# Patient Record
Sex: Female | Born: 1935 | Race: White | Hispanic: No | Smoking: Never smoker
Health system: Southern US, Community
[De-identification: ages and names within clinical notes are randomized; demographics above are authoritative.]

## PROBLEM LIST (undated history)

## (undated) DIAGNOSIS — K5790 Diverticulosis of intestine, part unspecified, without perforation or abscess without bleeding: Secondary | ICD-10-CM

## (undated) DIAGNOSIS — K760 Fatty (change of) liver, not elsewhere classified: Secondary | ICD-10-CM

## (undated) DIAGNOSIS — C801 Malignant (primary) neoplasm, unspecified: Secondary | ICD-10-CM

## (undated) DIAGNOSIS — I10 Essential (primary) hypertension: Secondary | ICD-10-CM

## (undated) DIAGNOSIS — H3552 Pigmentary retinal dystrophy: Secondary | ICD-10-CM

## (undated) DIAGNOSIS — R531 Weakness: Secondary | ICD-10-CM

## (undated) DIAGNOSIS — G5600 Carpal tunnel syndrome, unspecified upper limb: Secondary | ICD-10-CM

## (undated) DIAGNOSIS — M549 Dorsalgia, unspecified: Secondary | ICD-10-CM

## (undated) DIAGNOSIS — H548 Legal blindness, as defined in USA: Secondary | ICD-10-CM

## (undated) DIAGNOSIS — E785 Hyperlipidemia, unspecified: Secondary | ICD-10-CM

## (undated) DIAGNOSIS — I700 Atherosclerosis of aorta: Secondary | ICD-10-CM

## (undated) DIAGNOSIS — R351 Nocturia: Secondary | ICD-10-CM

## (undated) DIAGNOSIS — F411 Generalized anxiety disorder: Secondary | ICD-10-CM

## (undated) DIAGNOSIS — D649 Anemia, unspecified: Secondary | ICD-10-CM

## (undated) DIAGNOSIS — M1990 Unspecified osteoarthritis, unspecified site: Secondary | ICD-10-CM

## (undated) DIAGNOSIS — G8929 Other chronic pain: Secondary | ICD-10-CM

## (undated) DIAGNOSIS — Z8619 Personal history of other infectious and parasitic diseases: Secondary | ICD-10-CM

## (undated) DIAGNOSIS — K219 Gastro-esophageal reflux disease without esophagitis: Secondary | ICD-10-CM

## (undated) DIAGNOSIS — Z9289 Personal history of other medical treatment: Secondary | ICD-10-CM

## (undated) HISTORY — DX: Fatty (change of) liver, not elsewhere classified: K76.0

## (undated) HISTORY — DX: Diverticulosis of intestine, part unspecified, without perforation or abscess without bleeding: K57.90

## (undated) HISTORY — DX: Carpal tunnel syndrome, unspecified upper limb: G56.00

## (undated) HISTORY — PX: OTHER SURGICAL HISTORY: SHX169

## (undated) HISTORY — DX: Atherosclerosis of aorta: I70.0

## (undated) HISTORY — DX: Essential (primary) hypertension: I10

## (undated) HISTORY — DX: Pigmentary retinal dystrophy: H35.52

## (undated) HISTORY — DX: Hyperlipidemia, unspecified: E78.5

## (undated) HISTORY — DX: Malignant (primary) neoplasm, unspecified: C80.1

## (undated) HISTORY — DX: Gastro-esophageal reflux disease without esophagitis: K21.9

## (undated) HISTORY — DX: Generalized anxiety disorder: F41.1

## (undated) HISTORY — DX: Anemia, unspecified: D64.9

## (undated) HISTORY — PX: COLONOSCOPY: SHX174

---
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
## 1971-05-11 HISTORY — PX: OTHER SURGICAL HISTORY: SHX169

## 1987-05-11 HISTORY — PX: OOPHORECTOMY: SHX86

## 1987-05-11 HISTORY — PX: ABDOMINAL HYSTERECTOMY: SHX81

## 1998-09-29 ENCOUNTER — Ambulatory Visit (HOSPITAL_COMMUNITY): Admission: RE | Admit: 1998-09-29 | Discharge: 1998-09-29 | Payer: Self-pay | Admitting: Internal Medicine

## 1999-11-10 ENCOUNTER — Encounter: Payer: Self-pay | Admitting: Surgery

## 1999-11-10 ENCOUNTER — Ambulatory Visit (HOSPITAL_COMMUNITY): Admission: RE | Admit: 1999-11-10 | Discharge: 1999-11-10 | Payer: Self-pay | Admitting: Surgery

## 2000-11-22 ENCOUNTER — Encounter: Payer: Self-pay | Admitting: Internal Medicine

## 2000-11-22 ENCOUNTER — Ambulatory Visit (HOSPITAL_COMMUNITY): Admission: RE | Admit: 2000-11-22 | Discharge: 2000-11-22 | Payer: Self-pay | Admitting: Internal Medicine

## 2001-08-29 ENCOUNTER — Ambulatory Visit (HOSPITAL_BASED_OUTPATIENT_CLINIC_OR_DEPARTMENT_OTHER): Admission: RE | Admit: 2001-08-29 | Discharge: 2001-08-29 | Payer: Self-pay | Admitting: Orthopedic Surgery

## 2002-02-26 ENCOUNTER — Ambulatory Visit (HOSPITAL_COMMUNITY): Admission: RE | Admit: 2002-02-26 | Discharge: 2002-02-26 | Payer: Self-pay | Admitting: Internal Medicine

## 2002-02-26 ENCOUNTER — Encounter: Payer: Self-pay | Admitting: Internal Medicine

## 2002-05-10 DIAGNOSIS — Z85038 Personal history of other malignant neoplasm of large intestine: Secondary | ICD-10-CM

## 2002-05-10 HISTORY — DX: Personal history of other malignant neoplasm of large intestine: Z85.038

## 2003-04-05 ENCOUNTER — Ambulatory Visit (HOSPITAL_COMMUNITY): Admission: RE | Admit: 2003-04-05 | Discharge: 2003-04-05 | Payer: Self-pay | Admitting: General Surgery

## 2003-04-08 ENCOUNTER — Inpatient Hospital Stay (HOSPITAL_COMMUNITY): Admission: RE | Admit: 2003-04-08 | Discharge: 2003-04-15 | Payer: Self-pay | Admitting: General Surgery

## 2003-04-08 ENCOUNTER — Encounter (INDEPENDENT_AMBULATORY_CARE_PROVIDER_SITE_OTHER): Payer: Self-pay

## 2003-05-11 DIAGNOSIS — C801 Malignant (primary) neoplasm, unspecified: Secondary | ICD-10-CM

## 2003-05-11 HISTORY — DX: Malignant (primary) neoplasm, unspecified: C80.1

## 2003-09-03 ENCOUNTER — Ambulatory Visit (HOSPITAL_COMMUNITY): Admission: RE | Admit: 2003-09-03 | Discharge: 2003-09-03 | Payer: Self-pay | Admitting: Oncology

## 2004-03-24 ENCOUNTER — Ambulatory Visit: Payer: Self-pay | Admitting: Internal Medicine

## 2004-04-28 ENCOUNTER — Ambulatory Visit: Payer: Self-pay | Admitting: Oncology

## 2004-05-15 ENCOUNTER — Ambulatory Visit: Payer: Self-pay | Admitting: Internal Medicine

## 2004-05-28 ENCOUNTER — Ambulatory Visit: Payer: Self-pay | Admitting: Internal Medicine

## 2004-07-20 ENCOUNTER — Ambulatory Visit: Payer: Self-pay | Admitting: Oncology

## 2004-08-04 ENCOUNTER — Ambulatory Visit (HOSPITAL_COMMUNITY): Admission: RE | Admit: 2004-08-04 | Discharge: 2004-08-04 | Payer: Self-pay | Admitting: Oncology

## 2004-09-07 ENCOUNTER — Encounter: Admission: RE | Admit: 2004-09-07 | Discharge: 2004-09-07 | Payer: Self-pay | Admitting: Oncology

## 2005-01-22 ENCOUNTER — Ambulatory Visit (HOSPITAL_COMMUNITY): Admission: RE | Admit: 2005-01-22 | Discharge: 2005-01-22 | Payer: Self-pay | Admitting: Oncology

## 2005-01-22 ENCOUNTER — Ambulatory Visit: Payer: Self-pay | Admitting: Oncology

## 2005-01-28 ENCOUNTER — Ambulatory Visit: Payer: Self-pay | Admitting: Internal Medicine

## 2005-01-28 ENCOUNTER — Emergency Department (HOSPITAL_COMMUNITY): Admission: EM | Admit: 2005-01-28 | Discharge: 2005-01-28 | Payer: Self-pay | Admitting: Emergency Medicine

## 2005-01-29 ENCOUNTER — Ambulatory Visit: Admission: RE | Admit: 2005-01-29 | Discharge: 2005-01-29 | Payer: Self-pay | Admitting: Internal Medicine

## 2005-02-03 ENCOUNTER — Ambulatory Visit: Payer: Self-pay | Admitting: Internal Medicine

## 2005-02-15 ENCOUNTER — Ambulatory Visit: Payer: Self-pay | Admitting: Oncology

## 2005-02-16 ENCOUNTER — Ambulatory Visit: Payer: Self-pay | Admitting: Internal Medicine

## 2005-05-04 ENCOUNTER — Ambulatory Visit: Payer: Self-pay | Admitting: Internal Medicine

## 2005-05-27 ENCOUNTER — Ambulatory Visit: Payer: Self-pay | Admitting: Internal Medicine

## 2005-07-12 ENCOUNTER — Ambulatory Visit: Payer: Self-pay | Admitting: Internal Medicine

## 2005-07-13 ENCOUNTER — Ambulatory Visit: Payer: Self-pay | Admitting: Internal Medicine

## 2005-07-28 ENCOUNTER — Ambulatory Visit: Payer: Self-pay | Admitting: Internal Medicine

## 2005-07-28 ENCOUNTER — Encounter (INDEPENDENT_AMBULATORY_CARE_PROVIDER_SITE_OTHER): Payer: Self-pay | Admitting: *Deleted

## 2005-08-06 ENCOUNTER — Ambulatory Visit: Payer: Self-pay | Admitting: Oncology

## 2005-09-08 ENCOUNTER — Encounter: Payer: Self-pay | Admitting: Internal Medicine

## 2005-09-13 ENCOUNTER — Encounter: Admission: RE | Admit: 2005-09-13 | Discharge: 2005-09-13 | Payer: Self-pay | Admitting: Internal Medicine

## 2006-01-11 ENCOUNTER — Ambulatory Visit: Payer: Self-pay | Admitting: Internal Medicine

## 2006-01-17 ENCOUNTER — Ambulatory Visit: Payer: Self-pay | Admitting: Internal Medicine

## 2006-02-04 ENCOUNTER — Ambulatory Visit: Payer: Self-pay | Admitting: Oncology

## 2006-02-08 LAB — CBC WITH DIFFERENTIAL/PLATELET
BASO%: 0.6 % (ref 0.0–2.0)
Basophils Absolute: 0 10*3/uL (ref 0.0–0.1)
EOS%: 1.8 % (ref 0.0–7.0)
Eosinophils Absolute: 0.1 10*3/uL (ref 0.0–0.5)
HCT: 37.5 % (ref 34.8–46.6)
HGB: 13 g/dL (ref 11.6–15.9)
LYMPH%: 30.2 % (ref 14.0–48.0)
MCH: 30.1 pg (ref 26.0–34.0)
MCHC: 34.8 g/dL (ref 32.0–36.0)
MCV: 86.6 fL (ref 81.0–101.0)
MONO#: 0.3 10*3/uL (ref 0.1–0.9)
MONO%: 5.4 % (ref 0.0–13.0)
NEUT#: 2.9 10*3/uL (ref 1.5–6.5)
NEUT%: 62 % (ref 39.6–76.8)
Platelets: 221 10*3/uL (ref 145–400)
RBC: 4.32 10*6/uL (ref 3.70–5.32)
RDW: 12.9 % (ref 11.3–14.5)
WBC: 4.7 10*3/uL (ref 3.9–10.0)
lymph#: 1.4 10*3/uL (ref 0.9–3.3)

## 2006-02-08 LAB — COMPREHENSIVE METABOLIC PANEL
ALT: 19 U/L (ref 0–40)
AST: 18 U/L (ref 0–37)
Albumin: 4.3 g/dL (ref 3.5–5.2)
Alkaline Phosphatase: 46 U/L (ref 39–117)
BUN: 22 mg/dL (ref 6–23)
CO2: 28 mEq/L (ref 19–32)
Calcium: 9.5 mg/dL (ref 8.4–10.5)
Chloride: 104 mEq/L (ref 96–112)
Creatinine, Ser: 0.84 mg/dL (ref 0.40–1.20)
Glucose, Bld: 102 mg/dL — ABNORMAL HIGH (ref 70–99)
Potassium: 3.8 mEq/L (ref 3.5–5.3)
Sodium: 142 mEq/L (ref 135–145)
Total Bilirubin: 0.5 mg/dL (ref 0.3–1.2)
Total Protein: 7 g/dL (ref 6.0–8.3)

## 2006-02-08 LAB — CEA: CEA: 2.5 ng/mL (ref 0.0–5.0)

## 2006-04-18 ENCOUNTER — Ambulatory Visit: Payer: Self-pay | Admitting: Internal Medicine

## 2006-08-10 ENCOUNTER — Ambulatory Visit: Payer: Self-pay | Admitting: Oncology

## 2006-08-15 LAB — COMPREHENSIVE METABOLIC PANEL
ALT: 17 U/L (ref 0–35)
AST: 14 U/L (ref 0–37)
Albumin: 4.4 g/dL (ref 3.5–5.2)
Alkaline Phosphatase: 50 U/L (ref 39–117)
BUN: 19 mg/dL (ref 6–23)
CO2: 28 mEq/L (ref 19–32)
Calcium: 9.4 mg/dL (ref 8.4–10.5)
Chloride: 105 mEq/L (ref 96–112)
Creatinine, Ser: 0.69 mg/dL (ref 0.40–1.20)
Glucose, Bld: 96 mg/dL (ref 70–99)
Potassium: 3.7 mEq/L (ref 3.5–5.3)
Sodium: 144 mEq/L (ref 135–145)
Total Bilirubin: 0.4 mg/dL (ref 0.3–1.2)
Total Protein: 6.9 g/dL (ref 6.0–8.3)

## 2006-08-15 LAB — CBC WITH DIFFERENTIAL/PLATELET
BASO%: 0.6 % (ref 0.0–2.0)
Basophils Absolute: 0 10*3/uL (ref 0.0–0.1)
EOS%: 2.6 % (ref 0.0–7.0)
Eosinophils Absolute: 0.1 10*3/uL (ref 0.0–0.5)
HCT: 35.8 % (ref 34.8–46.6)
HGB: 12.8 g/dL (ref 11.6–15.9)
LYMPH%: 29.8 % (ref 14.0–48.0)
MCH: 30.4 pg (ref 26.0–34.0)
MCHC: 35.7 g/dL (ref 32.0–36.0)
MCV: 85.3 fL (ref 81.0–101.0)
MONO#: 0.3 10*3/uL (ref 0.1–0.9)
MONO%: 5.8 % (ref 0.0–13.0)
NEUT#: 2.7 10*3/uL (ref 1.5–6.5)
NEUT%: 61.2 % (ref 39.6–76.8)
Platelets: 198 10*3/uL (ref 145–400)
RBC: 4.2 10*6/uL (ref 3.70–5.32)
RDW: 13.3 % (ref 11.3–14.5)
WBC: 4.4 10*3/uL (ref 3.9–10.0)
lymph#: 1.3 10*3/uL (ref 0.9–3.3)

## 2006-08-15 LAB — CEA: CEA: 3 ng/mL (ref 0.0–5.0)

## 2006-08-23 ENCOUNTER — Ambulatory Visit: Payer: Self-pay | Admitting: Internal Medicine

## 2006-08-23 LAB — CONVERTED CEMR LAB
Cholesterol: 281 mg/dL (ref 0–200)
Direct LDL: 175.1 mg/dL
HDL: 67.4 mg/dL (ref >39.0)
TSH: 1.44 microintl units/mL (ref 0.35–5.50)
Total CHOL/HDL Ratio: 4.2
Triglycerides: 229 mg/dL (ref 0–149)
VLDL: 46 mg/dL — ABNORMAL HIGH (ref 0–40)

## 2006-12-17 ENCOUNTER — Encounter: Payer: Self-pay | Admitting: Internal Medicine

## 2006-12-17 DIAGNOSIS — M1250 Traumatic arthropathy, unspecified site: Secondary | ICD-10-CM | POA: Insufficient documentation

## 2006-12-17 DIAGNOSIS — G5600 Carpal tunnel syndrome, unspecified upper limb: Secondary | ICD-10-CM | POA: Insufficient documentation

## 2006-12-20 ENCOUNTER — Encounter: Admission: RE | Admit: 2006-12-20 | Discharge: 2006-12-20 | Payer: Self-pay | Admitting: Internal Medicine

## 2007-02-09 ENCOUNTER — Ambulatory Visit: Payer: Self-pay | Admitting: Oncology

## 2007-02-14 LAB — CBC WITH DIFFERENTIAL/PLATELET
BASO%: 0.6 % (ref 0.0–2.0)
Basophils Absolute: 0 10*3/uL (ref 0.0–0.1)
EOS%: 2 % (ref 0.0–7.0)
Eosinophils Absolute: 0.1 10*3/uL (ref 0.0–0.5)
HCT: 37.1 % (ref 34.8–46.6)
HGB: 13 g/dL (ref 11.6–15.9)
LYMPH%: 35.7 % (ref 14.0–48.0)
MCH: 30.4 pg (ref 26.0–34.0)
MCHC: 35.2 g/dL (ref 32.0–36.0)
MCV: 86.4 fL (ref 81.0–101.0)
MONO#: 0.2 10*3/uL (ref 0.1–0.9)
MONO%: 5.6 % (ref 0.0–13.0)
NEUT#: 2.4 10*3/uL (ref 1.5–6.5)
NEUT%: 56.1 % (ref 39.6–76.8)
Platelets: 209 10*3/uL (ref 145–400)
RBC: 4.29 10*6/uL (ref 3.70–5.32)
RDW: 13.6 % (ref 11.3–14.5)
WBC: 4.3 10*3/uL (ref 3.9–10.0)
lymph#: 1.5 10*3/uL (ref 0.9–3.3)

## 2007-02-14 LAB — COMPREHENSIVE METABOLIC PANEL
ALT: 21 U/L (ref 0–35)
AST: 17 U/L (ref 0–37)
Albumin: 4.2 g/dL (ref 3.5–5.2)
Alkaline Phosphatase: 48 U/L (ref 39–117)
BUN: 21 mg/dL (ref 6–23)
CO2: 25 mEq/L (ref 19–32)
Calcium: 9.6 mg/dL (ref 8.4–10.5)
Chloride: 105 mEq/L (ref 96–112)
Creatinine, Ser: 0.58 mg/dL (ref 0.40–1.20)
Glucose, Bld: 88 mg/dL (ref 70–99)
Potassium: 4.3 mEq/L (ref 3.5–5.3)
Sodium: 141 mEq/L (ref 135–145)
Total Bilirubin: 0.4 mg/dL (ref 0.3–1.2)
Total Protein: 6.9 g/dL (ref 6.0–8.3)

## 2007-02-14 LAB — CEA: CEA: 2.9 ng/mL (ref 0.0–5.0)

## 2007-05-07 ENCOUNTER — Emergency Department (HOSPITAL_COMMUNITY): Admission: EM | Admit: 2007-05-07 | Discharge: 2007-05-07 | Payer: Self-pay | Admitting: Emergency Medicine

## 2007-05-08 ENCOUNTER — Telehealth (INDEPENDENT_AMBULATORY_CARE_PROVIDER_SITE_OTHER): Payer: Self-pay | Admitting: *Deleted

## 2007-05-09 ENCOUNTER — Ambulatory Visit: Payer: Self-pay | Admitting: Internal Medicine

## 2007-05-09 DIAGNOSIS — E739 Lactose intolerance, unspecified: Secondary | ICD-10-CM | POA: Insufficient documentation

## 2007-05-09 DIAGNOSIS — Z85038 Personal history of other malignant neoplasm of large intestine: Secondary | ICD-10-CM | POA: Insufficient documentation

## 2007-05-09 DIAGNOSIS — E785 Hyperlipidemia, unspecified: Secondary | ICD-10-CM | POA: Insufficient documentation

## 2007-05-09 DIAGNOSIS — D649 Anemia, unspecified: Secondary | ICD-10-CM | POA: Insufficient documentation

## 2007-05-09 DIAGNOSIS — K5730 Diverticulosis of large intestine without perforation or abscess without bleeding: Secondary | ICD-10-CM | POA: Insufficient documentation

## 2007-05-09 DIAGNOSIS — Z8719 Personal history of other diseases of the digestive system: Secondary | ICD-10-CM | POA: Insufficient documentation

## 2007-05-09 DIAGNOSIS — Z860100 Personal history of colon polyps, unspecified: Secondary | ICD-10-CM | POA: Insufficient documentation

## 2007-05-09 DIAGNOSIS — M549 Dorsalgia, unspecified: Secondary | ICD-10-CM | POA: Insufficient documentation

## 2007-05-09 DIAGNOSIS — F411 Generalized anxiety disorder: Secondary | ICD-10-CM | POA: Insufficient documentation

## 2007-05-09 DIAGNOSIS — I10 Essential (primary) hypertension: Secondary | ICD-10-CM | POA: Insufficient documentation

## 2007-05-10 ENCOUNTER — Ambulatory Visit (HOSPITAL_COMMUNITY): Admission: RE | Admit: 2007-05-10 | Discharge: 2007-05-10 | Payer: Self-pay | Admitting: Internal Medicine

## 2007-05-14 ENCOUNTER — Encounter: Payer: Self-pay | Admitting: Internal Medicine

## 2007-08-01 ENCOUNTER — Ambulatory Visit: Payer: Self-pay | Admitting: Internal Medicine

## 2007-08-10 ENCOUNTER — Ambulatory Visit: Payer: Self-pay | Admitting: Oncology

## 2007-08-10 ENCOUNTER — Ambulatory Visit: Payer: Self-pay | Admitting: Internal Medicine

## 2007-08-10 ENCOUNTER — Encounter (INDEPENDENT_AMBULATORY_CARE_PROVIDER_SITE_OTHER): Payer: Self-pay | Admitting: *Deleted

## 2007-08-15 LAB — CBC WITH DIFFERENTIAL/PLATELET
BASO%: 1.1 % (ref 0.0–2.0)
Basophils Absolute: 0 10*3/uL (ref 0.0–0.1)
EOS%: 3.1 % (ref 0.0–7.0)
Eosinophils Absolute: 0.1 10*3/uL (ref 0.0–0.5)
HCT: 38.1 % (ref 34.8–46.6)
HGB: 13.3 g/dL (ref 11.6–15.9)
LYMPH%: 37.6 % (ref 14.0–48.0)
MCH: 29.7 pg (ref 26.0–34.0)
MCHC: 34.8 g/dL (ref 32.0–36.0)
MCV: 85.4 fL (ref 81.0–101.0)
MONO#: 0.3 10*3/uL (ref 0.1–0.9)
MONO%: 6.7 % (ref 0.0–13.0)
NEUT#: 2 10*3/uL (ref 1.5–6.5)
NEUT%: 51.5 % (ref 39.6–76.8)
Platelets: 196 10*3/uL (ref 145–400)
RBC: 4.46 10*6/uL (ref 3.70–5.32)
RDW: 13.2 % (ref 11.3–14.5)
WBC: 3.9 10*3/uL (ref 3.9–10.0)
lymph#: 1.5 10*3/uL (ref 0.9–3.3)

## 2007-08-15 LAB — COMPREHENSIVE METABOLIC PANEL
ALT: 20 U/L (ref 0–35)
AST: 15 U/L (ref 0–37)
Albumin: 4.4 g/dL (ref 3.5–5.2)
Alkaline Phosphatase: 44 U/L (ref 39–117)
BUN: 17 mg/dL (ref 6–23)
CO2: 26 mEq/L (ref 19–32)
Calcium: 9.3 mg/dL (ref 8.4–10.5)
Chloride: 106 mEq/L (ref 96–112)
Creatinine, Ser: 0.66 mg/dL (ref 0.40–1.20)
Glucose, Bld: 102 mg/dL — ABNORMAL HIGH (ref 70–99)
Potassium: 4 mEq/L (ref 3.5–5.3)
Sodium: 142 mEq/L (ref 135–145)
Total Bilirubin: 0.5 mg/dL (ref 0.3–1.2)
Total Protein: 6.9 g/dL (ref 6.0–8.3)

## 2007-08-15 LAB — CEA: CEA: 2.4 ng/mL (ref 0.0–5.0)

## 2007-08-22 ENCOUNTER — Encounter: Payer: Self-pay | Admitting: Internal Medicine

## 2007-08-23 ENCOUNTER — Ambulatory Visit (HOSPITAL_COMMUNITY): Admission: RE | Admit: 2007-08-23 | Discharge: 2007-08-23 | Payer: Self-pay | Admitting: Oncology

## 2007-09-04 ENCOUNTER — Ambulatory Visit: Payer: Self-pay | Admitting: Internal Medicine

## 2007-09-19 ENCOUNTER — Ambulatory Visit: Payer: Self-pay | Admitting: Internal Medicine

## 2007-12-26 ENCOUNTER — Encounter: Admission: RE | Admit: 2007-12-26 | Discharge: 2007-12-26 | Payer: Self-pay | Admitting: *Deleted

## 2008-03-07 ENCOUNTER — Ambulatory Visit: Payer: Self-pay | Admitting: Oncology

## 2008-03-11 LAB — CBC WITH DIFFERENTIAL/PLATELET
BASO%: 0.5 % (ref 0.0–2.0)
Basophils Absolute: 0 10*3/uL (ref 0.0–0.1)
EOS%: 2 % (ref 0.0–7.0)
Eosinophils Absolute: 0.1 10*3/uL (ref 0.0–0.5)
HCT: 35.8 % (ref 34.8–46.6)
HGB: 12.3 g/dL (ref 11.6–15.9)
LYMPH%: 30 % (ref 14.0–48.0)
MCH: 30.5 pg (ref 26.0–34.0)
MCHC: 34.4 g/dL (ref 32.0–36.0)
MCV: 88.4 fL (ref 81.0–101.0)
MONO#: 0.3 10*3/uL (ref 0.1–0.9)
MONO%: 6.2 % (ref 0.0–13.0)
NEUT#: 2.6 10*3/uL (ref 1.5–6.5)
NEUT%: 61.3 % (ref 39.6–76.8)
Platelets: 183 10*3/uL (ref 145–400)
RBC: 4.05 10*6/uL (ref 3.70–5.32)
RDW: 12.9 % (ref 11.3–14.5)
WBC: 4.3 10*3/uL (ref 3.9–10.0)
lymph#: 1.3 10*3/uL (ref 0.9–3.3)

## 2008-03-11 LAB — COMPREHENSIVE METABOLIC PANEL
ALT: 27 U/L (ref 0–35)
AST: 28 U/L (ref 0–37)
Albumin: 4.2 g/dL (ref 3.5–5.2)
Alkaline Phosphatase: 34 U/L — ABNORMAL LOW (ref 39–117)
BUN: 20 mg/dL (ref 6–23)
CO2: 27 mEq/L (ref 19–32)
Calcium: 9.3 mg/dL (ref 8.4–10.5)
Chloride: 103 mEq/L (ref 96–112)
Creatinine, Ser: 0.7 mg/dL (ref 0.40–1.20)
Glucose, Bld: 101 mg/dL — ABNORMAL HIGH (ref 70–99)
Potassium: 3.8 mEq/L (ref 3.5–5.3)
Sodium: 140 mEq/L (ref 135–145)
Total Bilirubin: 0.4 mg/dL (ref 0.3–1.2)
Total Protein: 6.6 g/dL (ref 6.0–8.3)

## 2008-03-11 LAB — CEA: CEA: 2.5 ng/mL (ref 0.0–5.0)

## 2008-03-18 ENCOUNTER — Encounter: Payer: Self-pay | Admitting: Internal Medicine

## 2008-04-15 ENCOUNTER — Ambulatory Visit: Payer: Self-pay | Admitting: Vascular Surgery

## 2008-06-27 ENCOUNTER — Encounter (INDEPENDENT_AMBULATORY_CARE_PROVIDER_SITE_OTHER): Payer: Self-pay | Admitting: *Deleted

## 2008-08-20 ENCOUNTER — Ambulatory Visit: Payer: Self-pay | Admitting: Internal Medicine

## 2008-08-20 DIAGNOSIS — N63 Unspecified lump in unspecified breast: Secondary | ICD-10-CM | POA: Insufficient documentation

## 2008-08-22 ENCOUNTER — Encounter: Admission: RE | Admit: 2008-08-22 | Discharge: 2008-08-22 | Payer: Self-pay | Admitting: Internal Medicine

## 2008-09-09 ENCOUNTER — Telehealth (INDEPENDENT_AMBULATORY_CARE_PROVIDER_SITE_OTHER): Payer: Self-pay | Admitting: *Deleted

## 2008-09-09 ENCOUNTER — Ambulatory Visit: Payer: Self-pay | Admitting: Internal Medicine

## 2008-09-09 LAB — CONVERTED CEMR LAB
Basophils Absolute: 0 10*3/uL (ref 0.0–0.1)
Basophils Relative: 0.3 % (ref 0.0–3.0)
Eosinophils Absolute: 0.1 10*3/uL (ref 0.0–0.7)
Eosinophils Relative: 2.9 % (ref 0.0–5.0)
HCT: 37.4 % (ref 36.0–46.0)
Hemoglobin: 12.9 g/dL (ref 12.0–15.0)
Lymphocytes Relative: 28.8 % (ref 12.0–46.0)
Lymphs Abs: 1.4 10*3/uL (ref 0.7–4.0)
MCHC: 34.4 g/dL (ref 30.0–36.0)
MCV: 90.3 fL (ref 78.0–100.0)
Monocytes Absolute: 0.3 10*3/uL (ref 0.1–1.0)
Monocytes Relative: 5.7 % (ref 3.0–12.0)
Neutro Abs: 3.1 10*3/uL (ref 1.4–7.7)
Neutrophils Relative %: 62.3 % (ref 43.0–77.0)
Platelets: 159 10*3/uL (ref 150.0–400.0)
RBC: 4.14 M/uL (ref 3.87–5.11)
RDW: 12.4 % (ref 11.5–14.6)
WBC: 4.9 10*3/uL (ref 4.5–10.5)

## 2008-09-16 ENCOUNTER — Ambulatory Visit: Payer: Self-pay | Admitting: Internal Medicine

## 2008-09-16 LAB — CONVERTED CEMR LAB
Cholesterol, target level: 200 mg/dL
HDL goal, serum: 40 mg/dL
LDL Goal: 160 mg/dL

## 2008-09-17 ENCOUNTER — Encounter (INDEPENDENT_AMBULATORY_CARE_PROVIDER_SITE_OTHER): Payer: Self-pay | Admitting: *Deleted

## 2008-09-17 LAB — CONVERTED CEMR LAB
Cholesterol: 232 mg/dL — ABNORMAL HIGH (ref 0–200)
Direct LDL: 149 mg/dL
HDL: 63 mg/dL (ref >39.00)
Total CHOL/HDL Ratio: 4
Triglycerides: 89 mg/dL (ref 0.0–149.0)
VLDL: 17.8 mg/dL (ref 0.0–40.0)

## 2008-10-09 ENCOUNTER — Telehealth: Payer: Self-pay | Admitting: Internal Medicine

## 2008-10-14 ENCOUNTER — Ambulatory Visit: Payer: Self-pay | Admitting: Internal Medicine

## 2008-10-29 ENCOUNTER — Ambulatory Visit: Payer: Self-pay | Admitting: Internal Medicine

## 2008-10-31 ENCOUNTER — Telehealth: Payer: Self-pay | Admitting: Internal Medicine

## 2008-11-01 ENCOUNTER — Encounter: Payer: Self-pay | Admitting: Internal Medicine

## 2008-11-12 ENCOUNTER — Ambulatory Visit: Payer: Self-pay | Admitting: Internal Medicine

## 2008-11-12 LAB — HM COLONOSCOPY

## 2008-11-20 ENCOUNTER — Encounter: Payer: Self-pay | Admitting: Internal Medicine

## 2008-12-04 ENCOUNTER — Ambulatory Visit: Payer: Self-pay | Admitting: Internal Medicine

## 2008-12-04 DIAGNOSIS — H3552 Pigmentary retinal dystrophy: Secondary | ICD-10-CM | POA: Insufficient documentation

## 2008-12-04 DIAGNOSIS — M6749 Ganglion, multiple sites: Secondary | ICD-10-CM | POA: Insufficient documentation

## 2008-12-04 LAB — CONVERTED CEMR LAB
ALT: 26 units/L (ref 0–35)
AST: 29 units/L (ref 0–37)
Albumin: 3.8 g/dL (ref 3.5–5.2)
Alkaline Phosphatase: 39 units/L (ref 39–117)
Bilirubin, Direct: 0.1 mg/dL (ref 0.0–0.3)
Total Bilirubin: 0.9 mg/dL (ref 0.3–1.2)
Total Protein: 6.9 g/dL (ref 6.0–8.3)

## 2008-12-12 ENCOUNTER — Ambulatory Visit (HOSPITAL_BASED_OUTPATIENT_CLINIC_OR_DEPARTMENT_OTHER): Admission: RE | Admit: 2008-12-12 | Discharge: 2008-12-12 | Payer: Self-pay | Admitting: Orthopedic Surgery

## 2008-12-16 ENCOUNTER — Telehealth: Payer: Self-pay | Admitting: Internal Medicine

## 2009-01-06 ENCOUNTER — Encounter: Admission: RE | Admit: 2009-01-06 | Discharge: 2009-01-06 | Payer: Self-pay | Admitting: Internal Medicine

## 2009-01-14 ENCOUNTER — Encounter: Payer: Self-pay | Admitting: Internal Medicine

## 2009-02-14 ENCOUNTER — Ambulatory Visit: Payer: Self-pay | Admitting: Internal Medicine

## 2009-02-14 DIAGNOSIS — IMO0001 Reserved for inherently not codable concepts without codable children: Secondary | ICD-10-CM | POA: Insufficient documentation

## 2009-02-14 DIAGNOSIS — R05 Cough: Secondary | ICD-10-CM

## 2009-02-14 DIAGNOSIS — R059 Cough, unspecified: Secondary | ICD-10-CM | POA: Insufficient documentation

## 2009-03-05 ENCOUNTER — Telehealth (INDEPENDENT_AMBULATORY_CARE_PROVIDER_SITE_OTHER): Payer: Self-pay | Admitting: *Deleted

## 2009-03-06 DIAGNOSIS — H579 Unspecified disorder of eye and adnexa: Secondary | ICD-10-CM | POA: Insufficient documentation

## 2009-04-14 ENCOUNTER — Ambulatory Visit: Payer: Self-pay | Admitting: Internal Medicine

## 2009-04-14 DIAGNOSIS — L600 Ingrowing nail: Secondary | ICD-10-CM | POA: Insufficient documentation

## 2009-04-16 ENCOUNTER — Ambulatory Visit: Payer: Self-pay | Admitting: Internal Medicine

## 2009-04-16 ENCOUNTER — Telehealth: Payer: Self-pay | Admitting: Internal Medicine

## 2009-04-16 DIAGNOSIS — R112 Nausea with vomiting, unspecified: Secondary | ICD-10-CM | POA: Insufficient documentation

## 2009-12-08 ENCOUNTER — Ambulatory Visit: Payer: Self-pay | Admitting: Cardiology

## 2010-03-02 ENCOUNTER — Encounter: Payer: Self-pay | Admitting: Internal Medicine

## 2010-03-12 ENCOUNTER — Telehealth: Payer: Self-pay | Admitting: Internal Medicine

## 2010-04-07 ENCOUNTER — Encounter: Admission: RE | Admit: 2010-04-07 | Discharge: 2010-04-07 | Payer: Self-pay

## 2010-04-07 LAB — HM MAMMOGRAPHY: HM Mammogram: NEGATIVE

## 2010-05-30 ENCOUNTER — Encounter: Payer: Self-pay | Admitting: Oncology

## 2010-05-31 ENCOUNTER — Encounter: Payer: Self-pay | Admitting: Internal Medicine

## 2010-06-11 NOTE — Letter (Signed)
Summary: [facility]  [employer] [facility]   Imported By: Sherian Rein 12/05/2008 08:33:25  _____________________________________________________________________  External Attachment:    Type:   Image     Comment:   External Document

## 2010-06-11 NOTE — Letter (Signed)
Summary: Recall Colonoscopy Letter  [facility]  [address] [place], [place] [address]   Phone: [phone]  Fax: [phone]      June 27, 2008 MRN: [id number]   Kathryn Romero [address] [place], [place]  [address]   Dear Ms. Luu,   According to your medical record, it is time for you to schedule a Colonoscopy. The American Cancer Society recommends this procedure as a method to detect early colon cancer. Patients with a family history of colon cancer, or a personal history of colon polyps or inflammatory bowel disease are at increased risk.  This letter has beeen generated based on the recommendations made at the time of your procedure. If you feel that in your particular situation this may no longer apply, please contact our office.  Please call our office at [phone] to schedule this appointment or to update your records at your earliest convenience.  Thank you for cooperating with [place] to provide you with the very best care possible.   Sincerely,  Wilhemina Bonito. Marina Goodell, M.D.  [facility] [phone]

## 2010-06-11 NOTE — Miscellaneous (Signed)
Summary: Flu shot   Clinical Lists Changes  Observations: Added new observation of FLU VAX: Historical (01/10/2009 11:12)      Immunization History:  Influenza Immunization History:    Influenza:  historical (01/10/2009) done @ [facility]

## 2010-06-11 NOTE — Assessment & Plan Note (Signed)
Summary: COUGH AND CONGESTION/NWS $50   Vital Signs:  Patient profile:   75 year old female Height:      61.5 inches (156.21 cm) Weight:      173.0 pounds (78.64 kg) O2 Sat:      95 % Temp:     98.8 degrees F (37.11 degrees C) oral Pulse rate:   65 / minute BP sitting:   110 / 60  (left arm) Cuff size:   regular  Vitals Entered By: Orlan Leavens (Sep 16, 2008 9:13 AM) CC: Ongoing cough/congestion. Pt states she is coughing up phlegm no color, Cough, Lipid Management Is Patient Diabetic? No Pain Assessment Patient in pain? no        Primary Care Provider:  Newt Lukes MD  CC:  Ongoing cough/congestion. Pt states she is coughing up phlegm no color, Cough, and Lipid Management.  History of Present Illness:  Cough      This is a 75 year old woman who presents with Cough.  clear sputum.  The patient reports productive cough, but denies fever and hemoptysis.  The patient denies the following symptoms: cold/URI symptoms, sore throat, nasal congestion, and acid reflux symptoms.  The cough is worse with lying down.  Risk factors include history of allergic rhinitis.  Diagnostic testing to date has included CXR.    Lipid Management History:      Positive NCEP/ATP III risk factors include female age 38 years old or older and hypertension.  Negative NCEP/ATP III risk factors include HDL cholesterol greater than 60 and non-tobacco-user status.        The patient states that she knows about the "Therapeutic Lifestyle Change" diet.  The patient expresses understanding of adjunctive measures for cholesterol lowering.  Adjunctive measures started by the patient include aerobic exercise.  She expresses no side effects from her lipid-lowering medication.  Comments include: not taking any.     Clinical Review Panels:  CBC   WBC:  4.9 (09/09/2008)   RBC:  4.14 (09/09/2008)   Hgb:  12.9 (09/09/2008)   Hct:  37.4 (09/09/2008)   Platelets:  159.0 (09/09/2008)   MCV  90.3 (09/09/2008)  MCHC  34.4 (09/09/2008)   RDW  12.4 (09/09/2008)   PMN:  62.3 (09/09/2008)   Lymphs:  28.8 (09/09/2008)   Monos:  5.7 (09/09/2008)   Eosinophils:  2.9 (09/09/2008)   Basophil:  0.3 (09/09/2008)   Allergies: 1)  ! Morphine 2)  ! * Sulfer 3)  ! * Codiene PMH reviewed for relevance  Review of Systems      See HPI General:  Denies chills and fever. ENT:  Denies difficulty swallowing, ear discharge, and nasal congestion. Resp:  Complains of cough; denies chest discomfort, chest pain with inspiration, and wheezing.  Physical Exam  General:  alert, well-developed, well-nourished, and cooperative to examination.    Ears:  vision grossly intact; pupils equal, round and reactive to light.  conjunctiva and lids normal.    Mouth:  teeth and gums in good repair; mucous membranes moist, without lesions or ulcers. oropharynx clear without exudate, erythema.  Lungs:  normal respiratory effort, no intercostal retractions or use of accessory muscles; normal breath sounds bilaterally - no crackles and no wheezes.    Heart:  normal rate, regular rhythm, no murmur, and no rub. BLE without edema. normal DP pulses and normal cap refill in all 4 extremities      Impression & Recommendations:  Problem # 1:  BRONCHOPNEUMONIA ORGANISM UNSPECIFIED (ICD-485) Assessment Improved  exam improved from last visit afebrile and no hypoxia will treat with increase antitussive (mucinex) as well as possible reflux contib to sxs  Orders: Prescription Created Electronically [phone])  Problem # 2:  HYPERLIPIDEMIA (ICD-272.4)  fasting today. Will recheck and reevaluate. diet and exercise recommended   HDL:67.4 (08/23/2006)  LDL:DEL (08/23/2006)  Chol:281 (08/23/2006)  Trig:229 (08/23/2006)  Orders: TLB-Lipid Panel (80061-LIPID)  Complete Medication List: 1)  Benzonatate 100 Mg Caps (Benzonatate) .Marland Kitchen.. 1 by mouth three times a day as needed for cough 2)  Benzonatate 100 Mg Caps (Benzonatate) .... 2 by mouth  three times daily for cough  Lipid Assessment/Plan:      Based on NCEP/ATP III, the patient's risk factor category is "0-1 risk factors".  From this information, the patient's calculated lipid goals are as follows: Total cholesterol goal is 200; LDL cholesterol goal is 160; HDL cholesterol goal is 40; Triglyceride goal is 150.    Patient Instructions: 1)  add mucinex one tablet twice daily in addition to the tessalon perles (2 perles 3x/day)-for cough  2)  also take prilosec otc once daily in AM for next 2 weeks to help with silent reflux that may be making your cough worse 3)  if fever, chest pain or change in sputum, let us know for reevaluation or go to ER for evaluation 4)  cholesterol lab work will be done today. Lab letter will be sent to you with these results Prescriptions: BENZONATATE 100 MG CAPS (BENZONATATE) 2 by mouth three times daily for cough  #60 x 1   Entered and Authorized by:   Newt Lukes MD   Signed by:   Newt Lukes MD on 09/16/2008   Method used:   Electronically to        [facility] (retail)       [address]       [place], [place]  [address]       Ph: [phone] or [phone]       Fax: [phone]   RxID:   [id number]

## 2010-06-11 NOTE — Medication Information (Signed)
[facility]: [facility]   Imported By: Sherian Rein 11/06/2008 14:08:45  _____________________________________________________________________  External Attachment:    Type:   Image     Comment:   External Document

## 2010-06-11 NOTE — Procedures (Signed)
Summary: Colonoscopy   Colonoscopy  Procedure date:  11/12/2008  Findings:      Location:  [facility].    Procedures Next Due Date:    Colonoscopy: 11/2013  COLONOSCOPY PROCEDURE REPORT  PATIENT:  Kathryn Romero, Kathryn Romero  MR#:  [id number] BIRTHDATE:   [birth year], 72 yrs. old   GENDER:   female  ENDOSCOPIST:   Wilhemina Bonito. Eda Keys, MD Referred by: Surveillance Program - Recall  PROCEDURE DATE:  11/12/2008 PROCEDURE:  Colonoscopy with snare polypectomy ASA CLASS:   Class II INDICATIONS: history of colon cancer (2004;  w/ surveillance 2006,2007)  MEDICATIONS:    Fentanyl 75 mcg IV, Versed 8 mg IV  DESCRIPTION OF PROCEDURE:   After the risks benefits and alternatives of the procedure were thoroughly explained, informed consent was obtained.  Digital rectal exam was performed and revealed no abnormalities.   The LB [id number] endoscope was introduced through the anus and advanced to the anastamosis, without limitations. Time =2:34 min. The quality of the prep was excellent, using MoviPrep.  The instrument was then withdrawn (time = 10:50 min) as the colon was fully examined. <<PROCEDUREIMAGES>>        <<OLD IMAGES>>  FINDINGS:  A diminutive polyp was found in the sigmoid colon. Polyp was snared without cautery. Retrieval was unsuccessful due to tiny size.  Moderate diverticulosis was found in the sigmoid colon.  Melanosis coli was found throughout the colon.   Retroflexed views in the rectum revealed small internal hemorrhoids.    The scope was then withdrawn from the patient and the procedure completed.  COMPLICATIONS:   None  ENDOSCOPIC IMPRESSION:  1) Diminutive polyp in the sigmoid colon -removed  2) Moderate diverticulosis in the sigmoid colon  3) Melanosis throughout the colon  4) Small Internal hemorrhoids  RECOMMENDATIONS:  1) Follow up colonoscopy in 5 years    _______________________________ Wilhemina Bonito. Eda Keys, MD  CC: Ruthann Cancer, MD;   Corwin Levins, MD;  The Patient      This report was created from the original endoscopy report, which was reviewed and signed by the above listed endoscopist.

## 2010-06-11 NOTE — Letter (Signed)
Summary: [facility]   Imported By: Maryln Gottron 09/18/2007 14:14:29  _____________________________________________________________________  External Attachment:    Type:   Image     Comment:   External Document

## 2010-06-11 NOTE — Letter (Signed)
Summary: No evidence od disease/[facility]  [facility] evidence od disease/[facility]   Imported By: Lester Eldridge 04/19/2008 11:27:06  _____________________________________________________________________  External Attachment:    Type:   Image     Comment:   External Document

## 2010-06-11 NOTE — Assessment & Plan Note (Signed)
Summary: TROUBLE WITH STOMACH/AWARE OF FEE/NML   Vital Signs:  Patient Profile:   75 Years Old Female Weight:      189 pounds Temp:     97.3 degrees F Pulse rate:   67 / minute BP sitting:   149 / 64  (right arm) Cuff size:   regular  Pt. in pain?   no  Vitals Entered By: Maris Berger (August 10, 2007 3:53 PM)                  Chief Complaint:  stomach problem.  History of Present Illness: has been on wt watchers and low carb diet to try to lsoe wt with borderline dm, sees endo - never lost wt over 3 months despite excercise too; , now with pain to the upper abd pain and bloating that seems to radiated to the back; no nausea or vomit, has hx of h pylori tx and colon cancer with right hemicolectomy; no real change in bowel haits per pt in freq or o/w;     Updated Prior Medication List: HYDROCHLOROTHIAZIDE 25 MG TABS (HYDROCHLOROTHIAZIDE) Take 1 tablet by mouth once a day SIMVASTATIN 80 MG TABS (SIMVASTATIN) Take 1 tablet by mouth once a day VICODIN 5-500 MG  TABS (HYDROCODONE-ACETAMINOPHEN) 1 by mouth qid prn FLEXERIL 5 MG  TABS (CYCLOBENZAPRINE HCL) 1 by mouth three times a day prn OMEPRAZOLE 20 MG  TBEC (OMEPRAZOLE) 1 by mouth qd  Current Allergies (reviewed today): ! MORPHINE ! * SULFER ! * CODIENE  Past Medical History:    Reviewed history from 05/09/2007 and no changes required:       Hyperlipidemia       Colon cancer, hx of       Hypertension       glucose intolerance       Anemia-NOS       Anxiety       hx of H pylori tx       hx of retinitis pigmentosa       Diverticulitis, hx of       Diverticulosis, colon       Colonic polyps, hx of  Past Surgical History:    Reviewed history from 05/09/2007 and no changes required:       Appendectomy       s/p right hemicolectomy 11/04       Carpal tunnel release - left       Hysterectomy       Oophorectomy       Cataract extraction       s/p ganglion cyst   Social History:    Reviewed history from  05/09/2007 and no changes required:       Never Smoked       Alcohol use-no     Physical Exam  General:     Well-developed,well-nourished,in no acute distress; alert,appropriate and cooperative throughout examination Head:     Normocephalic and atraumatic without obvious abnormalities. No apparent alopecia or balding. Eyes:     No corneal or conjunctival inflammation noted. EOMI. Perrla Ears:     External ear exam shows no significant lesions or deformities.  Otoscopic examination reveals clear canals, tympanic membranes are intact bilaterally without bulging, retraction, inflammation or discharge. Hearing is grossly normal bilaterally. Nose:     External nasal examination shows no deformity or inflammation. Nasal mucosa are pink and moist without lesions or exudates. Mouth:     Oral mucosa and oropharynx without lesions or exudates.  Teeth in good repair. Neck:     No deformities, masses, or tenderness noted. Lungs:     Normal respiratory effort, chest expands symmetrically. Lungs are clear to auscultation, no crackles or wheezes. Heart:     Normal rate and regular rhythm. S1 and S2 normal without gallop, murmur, click, rub or other extra sounds. Abdomen:     mild epigastric tender Extremities:     no edema, no ulcers     Impression & Recommendations:  Problem # 1:  ABDOMINAL PAIN (ICD-789.00) suspect peptic issue - will try omeprazole 20 mg - 1 per day, refer GI dr Marina Goodell per pt request - ? need EGD Orders: Gastroenterology Referral (GI)   Problem # 2:  HYPERTENSION (ICD-401.9)  Her updated medication list for this problem includes:    Hydrochlorothiazide 25 Mg Tabs (Hydrochlorothiazide) .Marland Kitchen... Take 1 tablet by mouth once a day  BP today: 149/64 Prior BP: 140/77 (05/09/2007)  Labs Reviewed: Chol: 281 (08/23/2006)   HDL: 67.4 (08/23/2006)   LDL: DEL (08/23/2006)   TG: 229 (08/23/2006)  mild elev today, likely situational, ok to cont same meds   Complete  Medication List: 1)  Hydrochlorothiazide 25 Mg Tabs (Hydrochlorothiazide) .... Take 1 tablet by mouth once a day 2)  Simvastatin 80 Mg Tabs (Simvastatin) .... Take 1 tablet by mouth once a day 3)  Vicodin 5-500 Mg Tabs (Hydrocodone-acetaminophen) .Marland Kitchen.. 1 by mouth qid prn 4)  Flexeril 5 Mg Tabs (Cyclobenzaprine hcl) .Marland Kitchen.. 1 by mouth three times a day prn 5)  Omeprazole 20 Mg Tbec (Omeprazole) .Marland Kitchen.. 1 by mouth qd   Patient Instructions: 1)  take all medications as prescribed 2)  you will be contacted about the referral(s) to: GI/ dr Marina Goodell 3)  Please schedule an appointment with your primary doctor on april 27 as planned    Prescriptions: OMEPRAZOLE 20 MG  TBEC (OMEPRAZOLE) 1 by mouth qd  #30 x 11   Entered and Authorized by:   Corwin Levins MD   Signed by:   Corwin Levins MD on 08/10/2007   Method used:   Print then Give to Patient   RxID:   [id number]  ]

## 2010-06-11 NOTE — Assessment & Plan Note (Signed)
Summary: [facility]  STC   Vital Signs:  Patient profile:   75 year old female Height:      61.5 inches (156.21 cm) Weight:      172.6 pounds (78.45 kg) O2 Sat:      94 % Temp:     99.4 degrees F (37.44 degrees C) oral Pulse rate:   70 / minute BP sitting:   120 / 72  (left arm) Cuff size:   regular  Vitals Entered By: Orlan Leavens (Sep 09, 2008 9:50 AM) CC: cough x's 1 week/ fever Is Patient Diabetic? No Pain Assessment Patient in pain? no        Primary Care Provider:  Corwin Levins MD  CC:  cough x's 1 week/ fever.  History of Present Illness: 75 year old woman here today with cough x1 week. Positive sputum primarily clear but thick and copious Chest congestion with aches, but no specific chest pain. Positive sick contacts with spouse.  Positive shortness of breath Positive nasal congestion. no allergy symptoms are sneezing  No sinus pressure. Positive fatigue, no fever, no chills  Preventive Screening-Counseling & Management     Smoking Status: never  Current Medications (verified): 1)  None  Allergies (verified): 1)  ! Morphine 2)  ! * Sulfer 3)  ! * Codiene PMH-FH-SH reviewed-no changes except otherwise noted  Review of Systems      See HPI General:  Complains of fatigue and weakness; denies chills, fever, and weight loss. ENT:  Complains of nasal congestion; denies ear discharge, earache, sinus pressure, and sore throat. Resp:  Complains of cough and shortness of breath; denies chest discomfort, chest pain with inspiration, and wheezing.  Physical Exam  General:  alert, well-developed, well-nourished, and cooperative to examination.    Head:  normocephalic, atraumatic, and no abnormalities observed.    Eyes:  vision grossly intact; pupils equal, round and reactive to light.  conjunctiva and lids normal.    Ears:  normal pinnae bilaterally, without erythema, swelling, or tenderness to palpation. TMs clear, without effusion, or cerumen impaction. Hearing  grossly normal bilaterally  Nose:  External nasal examination shows no deformity or inflammation. Nasal mucosa are pink and moist without lesions or exudates. Mouth:  Oral mucosa and oropharynx without lesions, mild erythema but no exudates.  Teeth in good repair.  Neck:  supple, full ROM, no masses, no thyromegaly; no thyroid nodules or tenderness. no JVD or carotid bruits.   Chest Wall:  No deformities, masses, or tenderness noted. Lungs:  normal respiratory effort, no intercostal retractions or use of accessory muscles; good breath sounds bilaterally - no crackles and no wheezes but + rhonchi LLL Heart:  normal rate, regular rhythm, no murmur, and no rub. BLE without edema. normal DP pulses and normal cap refill in all 4 extremities    Abdomen:  soft, non-tender, normal bowel sounds, no distention; no masses and no appreciable hepatomegaly or splenomegaly.   Pulses:  bilateral carotids without bruits. 2+ bilaterally in radial, dorsalis pedis and carotids.  Extremities:  No clubbing, cyanosis, edema, or deformity noted with normal full range of motion of all joints.   Neurologic:  alert & oriented X3 and cranial nerves II-XII symetrically intact.  strength normal in all extremities, sensation intact to light touch, and gait normal. speech fluent without dysarthria or aphasia  follows commands with good comprehension.  Skin:  no rashes vesicles, ulcers, or erythema. No nodules or irregularity to palpation.  Cervical Nodes:  No lymphadenopathy noted Axillary  Nodes:  No palpable lymphadenopathy Psych:  Oriented X3, memory intact for recent and remote, normally interactive, good eye contact, not anxious appearing, not depressed appearing, and not agitated.      Impression & Recommendations:  Problem # 1:  BRONCHOPNEUMONIA ORGANISM UNSPECIFIED (ICD-485)  afebrile and nontoxic on exam, but positive rhonchi We'll check chest x-ray and CBC today. Plan treatment with Doxy 100 mg x7 days, plus  Tessalon p.r.n. If symptoms unimproved next 72 hours, to call for reevaluation or proceed to emergency room if fever or worsening symptoms  Her updated medication list for this problem includes:    Doxycycline Hyclate 100 Mg Caps (Doxycycline hyclate) .Marland Kitchen... 1 by mouth two times a day x 7 days  Orders: T-2 View CXR, Same Day (71020.5TC) TLB-CBC Platelet - w/Differential ([id number]) Prescription Created Electronically ([id number])  Complete Medication List: 1)  Doxycycline Hyclate 100 Mg Caps (Doxycycline hyclate) .Marland Kitchen.. 1 by mouth two times a day x 7 days 2)  Benzonatate 100 Mg Caps (Benzonatate) .Marland Kitchen.. 1 by mouth three times a day as needed for cough  Patient Instructions: 1)  Chest x-ray and CBC today - ?early pneumonia on L base 2)  We'll treat with 7 days of antibiotics and as needed cough medication 3)  If fevers or symptoms unimproved next 72 hours. Please call this office or proceed to the emergency room for further evaluation. Prescriptions: BENZONATATE 100 MG CAPS (BENZONATATE) 1 by mouth three times a day as needed for cough  #40 x 1   Entered and Authorized by:   Newt Lukes MD   Signed by:   Newt Lukes MD on 09/09/2008   Method used:   Electronically to        [facility] (retail)       [address]       [place], [place]  [address]       Ph: [phone] or [phone]       Fax: [phone]   RxID:   [id number] DOXYCYCLINE HYCLATE 100 MG CAPS (DOXYCYCLINE HYCLATE) 1 by mouth two times a day x 7 days  #14 x 0   Entered and Authorized by:   Newt Lukes MD   Signed by:   Newt Lukes MD on 09/09/2008   Method used:   Electronically to        [facility] (retail)       [address]       [place], [place]  [address]       Ph: [phone] or [phone]       Fax: [phone]   RxID:   [id number]

## 2010-06-11 NOTE — Progress Notes (Signed)
----   Converted from flag ---- ---- 09/09/2008 12:06 PM, Newt Lukes MD wrote: would be ok with me - please also check with dr. Jonny Ruiz thanks  ---- 09/09/2008 11:09 AM, Ivar Bury wrote: DR Felicity Coyer, Day CARETTE SAYS YOU OK'D HER TO BE YOUR PT,SWITCHING FROM DR Jonny Ruiz TO YOU.   THANK  YOU SO MUCH FOR YOUR REPLY. ------------------------------  GAVE PT APPT:  09/23/08 @ 845A W/DR Mayra Neer

## 2010-06-11 NOTE — Assessment & Plan Note (Signed)
Summary: NAUSEA/CD   Vital Signs:  Patient profile:   75 year old female Height:      61.5 inches (156.21 cm) Weight:      168.12 pounds (76.42 kg) O2 Sat:      95 % on Room air Temp:     97.1 degrees F (36.17 degrees C) oral Pulse rate:   66 / minute BP sitting:   130 / 82  (left arm) Cuff size:   large  Vitals Entered By: Orlan Leavens (April 16, 2009 3:34 PM)  O2 Flow:  Room air CC: ? reaction to antibiotic was rx ceftin and tramadol on Monday. Nausea and vomiting started monday after starting ceftin Is Patient Diabetic? No Pain Assessment Patient in pain? no        Primary Care Provider:  Newt Lukes MD  CC:  ? reaction to antibiotic was rx ceftin and tramadol on Monday. Nausea and vomiting started monday after starting ceftin.  History of Present Illness: here today with complaint of nausea with vomitting . onset of symptoms was 2 days ago. course has been sudden onset and now occurs intermittent pattern (seems better, then worse again). problem precipitated by taking abx for toe infection and pain pills (ultram) symptom characterized as mild epigastric discomfort but not pain problem associated with dizziness and ear congestion but not associated with fever, headache, vision changes, tinnitus or diarrhea. symptoms improved by fasting and not taking pain pills since 1st day. symptoms worsened with movement. no prior hx of same symptoms.   Current Medications (verified): 1)  Ceftin 500 Mg Tab (Cefuroxime Axetil) .... Take One (1) Tablet By Mouth Two (2) Times A Day X 10 Days 2)  Tramadol Hcl 50 Mg Tabs (Tramadol Hcl) .Marland Kitchen.. 1-2 By Mouth Qid As Needed For Pain  Allergies (verified): 1)  ! Morphine 2)  ! * Sulfer 3)  ! * Codiene  Past History:  Past Medical History: Last updated: 05/09/2007 Hyperlipidemia Colon cancer, hx of Hypertension glucose intolerance Anemia-NOS Anxiety hx of H pylori tx hx of retinitis pigmentosa Diverticulitis, hx of  Diverticulosis, colon Colonic polyps, hx of  Review of Systems  The patient denies fever, weight loss, vision loss, decreased hearing, chest pain, headaches, abdominal pain, and difficulty walking.    Physical Exam  General:  alert, well-developed, well-nourished, and cooperative to examination.    Eyes:  no nystagmus or jaundice Ears:  normal pinnae bilaterally, without erythema, swelling, or tenderness to palpation. TMs clear, without effusion, or cerumen impaction. Hearing grossly normal bilaterally  Mouth:  teeth and gums in good repair; mucous membranes moist, without lesions or ulcers. oropharynx clear without exudate, erythema.  Lungs:  normal respiratory effort, no intercostal retractions or use of accessory muscles; normal breath sounds bilaterally - no crackles and no wheezes.    Heart:  normal rate, regular rhythm, no murmur, and no rub. BLE without edema.  Abdomen:  soft, non-tender, normal bowel sounds, no distention; no masses and no appreciable hepatomegaly or splenomegaly.   Msk:  great toe without erythem swelling or drainage - no signs of infection s/p partial nail removal Neurologic:  alert & oriented X3 and cranial nerves II-XII symetrically intact.  strength normal in all extremities, sensation intact to light touch, and gait normal. speech fluent without dysarthria or aphasia; follows commands with good comprehension.    Impression & Recommendations:  Problem # 1:  NAUSEA WITH VOMITING (ICD-787.01)  like induced by abx with mild narcotic - rec stopping both as  no evidence infx, nontoxic and no pain - may also have mild viral syndrom as spouse with loose stool x 2 days - though both afeb - rec meclizine trial too -  clears, adv as tol and educated on red flag signs of worsening problems - dehydration, fever, etc  Orders: Prescription Created Electronically [phone])  Problem # 2:  INGROWN TOENAIL, INFECTED (ICD-703.0) Assessment: Improved  The following  medications were removed from the medication list:    Ceftin 500 Mg Tab (Cefuroxime axetil) .Marland Kitchen... Take one (1) tablet by mouth two (2) times a day x 10 days  Complete Medication List: 1)  Meclizine Hcl 25 Mg Tabs (Meclizine hcl) .... 1/2-1 tab by mouth three times a day as needed for dizziness and/or nausea  Patient Instructions: 1)  it was good to see you today.  2)  stop the antibiotic and pain pills 3)  try meclizine as discussed for nausea and or dizzy symptoms  - your prescription has been electronically submitted to your pharmacy. Please take as directed. Contact our office if you believe you're having problems with the medication(s).  4)  Drink clear liquids only for the next 24 hours, then slowly add other liquids and food as you  tolerate them.  try broth, water,  juice, electrolyte solution like gatorade as tolerated 5)  if your symptoms continue to worsen (pain, fever, etc), or if you are unable take anything by mouth (pills, fluids, etc), you should go to the emergency room for further evaluation and treatment.  Prescriptions: MECLIZINE HCL 25 MG TABS (MECLIZINE HCL) 1/2-1 tab by mouth three times a day as needed for dizziness and/or nausea  #30 x 0   Entered and Authorized by:   Newt Lukes MD   Signed by:   Newt Lukes MD on 04/16/2009   Method used:   Electronically to        [facility]* (retail)       [address]       [place], [place]  [address]       Ph: [phone] or [phone]       Fax: [phone]   RxID:   [id number]

## 2010-06-11 NOTE — Progress Notes (Signed)
Summary: mammogram  Phone Note Outgoing Call   Call placed by: Orlan Leavens RMA,  March 12, 2010 2:19 PM Call placed to: Patient Summary of Call: Called pt to let her know md did recieved results back from the  thermal scan, and she still recommend mammogram screening. Pt states ok. will be contacted once mammogram has been set up. Initial call taken by: Orlan Leavens RMA,  March 12, 2010 2:21 PM  Follow-up for Phone Call        order for screen mammo ordered - last done 8/10 - thx Follow-up by: Newt Lukes MD,  March 13, 2010 8:21 AM

## 2010-06-11 NOTE — Progress Notes (Signed)
Summary: rx too expensive   Phone Note From Pharmacy Call back at [phone]   Caller: Dennie Bible from [facility] [address]  Call For: Marina Goodell  Summary of Call: insurance wont pay for movi prep would like to know if she can have the trilyte  Initial call taken by: Tawni Levy,  October 31, 2008 4:14 PM  Follow-up for Phone Call        Called back this am regarding prep. Spoke with Dr. Lamar Sprinkles nurse and she said that he would not approve the trilyte prep because it doesn't do as well of a cleanout.  Left message with pharmacy's machine for physician's.  Follow-up by: Clide Cliff RN,  November 01, 2008 8:35 AM

## 2010-06-11 NOTE — Progress Notes (Signed)
----   Converted from flag ---- ---- 09/09/2008 1:36 PM, Corwin Levins MD wrote: ok with me  ---- 09/09/2008 11:07 AM, Ivar Bury wrote: Beatris Ship ZO#[id number] IS REQUESTING TO SWITCH FROM YOU TO DR Felicity Coyer.  IS THIS OK W/YOU?  THANK YOU FOR YOUR REPLY. ------------------------------  GAVE PT APPT:  09/23/08 @ 845A W/DR Mayra Neer

## 2010-06-11 NOTE — Progress Notes (Signed)
Summary: ? follow up  Medications Added AZITHROMYCIN 250 MG TABS (AZITHROMYCIN) Take 2 tablet by mouth HYDROCHLOROTHIAZIDE 25 MG TABS (HYDROCHLOROTHIAZIDE) Take 1 tablet by mouth once a day SIMVASTATIN 80 MG TABS (SIMVASTATIN) Take 1 tablet by mouth once a day       Phone Note Call from Patient Call back at Home Phone [phone]   Caller: [phone]  Call For: dr Jonny Ruiz Summary of Call: per pt call in er  yesterday for lower back pain  couldnt walk , was told to follow up when dr Jonny Ruiz . when does he want her to be seen  Initial call taken by: Shelbie Proctor,  May 08, 2007 11:15 AM  Follow-up for Phone Call        next available ok Follow-up by: Corwin Levins MD,  May 08, 2007 11:18 AM  Additional Follow-up for Phone Call Additional follow up Details #1::        called pt May 08, 2007 1:40 PM at [phone] no answer Donn Rogacki walked in to front desk gave the msg to pt to make appt Additional Follow-up by: Shelbie Proctor,  May 08, 2007 2:25 PM    New/Updated Medications: AZITHROMYCIN 250 MG TABS (AZITHROMYCIN) Take 2 tablet by mouth HYDROCHLOROTHIAZIDE 25 MG TABS (HYDROCHLOROTHIAZIDE) Take 1 tablet by mouth once a day SIMVASTATIN 80 MG TABS (SIMVASTATIN) Take 1 tablet by mouth once a day

## 2010-06-11 NOTE — Progress Notes (Signed)
Summary: ABX PROBLEM  Phone Note Call from Patient   Summary of Call: Pt c/o nausea & vomiting since starting meds at last office visit. She thinks it is caused by antibiotic.  Initial call taken by: Lamar Sprinkles, CMA,  April 16, 2009 11:58 AM  Follow-up for Phone Call        she can stop the antibiotic Follow-up by: Etta Grandchild MD,  April 16, 2009 11:59 AM  Additional Follow-up for Phone Call Additional follow up Details #1::        Pt informed  Additional Follow-up by: Lamar Sprinkles, CMA,  April 16, 2009 1:13 PM

## 2010-06-11 NOTE — Letter (Signed)
Summary: Pt rleased/[facility]  Pt rleased/[facility]   Imported By: Lester Vandalia 04/19/2008 11:30:03  _____________________________________________________________________  External Attachment:    Type:   Image     Comment:   External Document

## 2010-06-11 NOTE — Letter (Signed)
Summary: Lipid Letter  [facility]  [address] [place], [place] [address]   Phone: [phone]  Fax: [phone]    09/17/2008  [facility] [address] [place], [place]  [address]  Dear Kathryn Romero:  We have carefully reviewed your last lipid profile from 08/23/2006 and the results are noted below with a summary of recommendations for lipid management.    Cholesterol:       232     Goal: <200   HDL "good" Cholesterol:   57.84     Goal: >40   LDL "bad" Cholesterol:   DEL     Goal: <160   Triglycerides:       89.0     Goal: <150  Total cholestrol should be less than 200. Inforcing diet and exercise. will recheck in 3-6 months. LDL (bad cholestrol) is in mild range. Recommending same as above diet and excercise. No medications are recommended for now, as no other CAD equilavents.  Attach a copy of lab for your records.     TLC Diet (Therapeutic Lifestyle Change): Saturated Fats & Transfatty acids should be kept < 7% of total calories ***Reduce Saturated Fats Polyunstaurated Fat can be up to 10% of total calories Monounsaturated Fat Fat can be up to 20% of total calories Total Fat should be no greater than 25-35% of total calories Carbohydrates should be 50-60% of total calories Protein should be approximately 15% of total calories Fiber should be at least 20-30 grams a day ***Increased fiber may help lower LDL Total Cholesterol should be < 200mg /day Consider adding plant stanol/sterols to diet (example: Benacol spread) ***A higher intake of unsaturated fat may reduce Triglycerides and Increase HDL    Adjunctive Measures (may lower LIPIDS and reduce risk of Heart Attack) include: Aerobic Exercise (20-30 minutes 3-4 times a week) Limit Alcohol Consumption Weight Reduction Aspirin 75-81 mg a day by mouth (if not allergic or contraindicated) Dietary Fiber 20-30 grams a day by mouth     Current Medications: 1)    Benzonatate 100 Mg Caps (Benzonatate) .Marland Kitchen..  1 by mouth three times a day as needed for cough 2)    Benzonatate 100 Mg Caps (Benzonatate) .... 2 by mouth three times daily for cough  If you have any questions, please call. We appreciate being able to work with you.   Sincerely,     [facility] Newt Lukes MD

## 2010-06-11 NOTE — Progress Notes (Signed)
Summary: pt request  Phone Note Call from Patient Call back at Home Phone [phone]   Caller: Patient Complaint: Chest Pain Summary of Call: pt called requesting a referral to an eye doctor Initial call taken by: Margaret Pyle, CMA,  March 05, 2009 3:42 PM  Follow-up for Phone Call        ok- done Follow-up by: Newt Lukes MD,  March 06, 2009 10:26 AM  Additional Follow-up for Phone Call Additional follow up Details #1::        Dr. Ladoris Gene is pt preferred dr. [phone] retinapigmentosis Additional Follow-up by: Margaret Pyle, CMA,  March 06, 2009 10:34 AM  New Problems: OTHER EYE PROBLEMS (ICD-V41.1)   Additional Follow-up for Phone Call Additional follow up Details #2::    Appt with Dr Ladoris Gene ([facility]) 03/28/09 @ 9am, PT IS AWARE. Follow-up by: Dagoberto Reef,  March 06, 2009 11:20 AM  New Problems: OTHER EYE PROBLEMS (ICD-V41.1)

## 2010-06-11 NOTE — Assessment & Plan Note (Signed)
Summary: COUGH/ CHEST AND BACK HURT WHEN COUGHS/ NWS #   Vital Signs:  Patient profile:   75 year old female Height:      61.5 inches (156.21 cm) Weight:      168.2 pounds (76.45 kg) O2 Sat:      96 % Temp:     98.6 degrees F (37.00 degrees C) oral Pulse rate:   63 / minute BP sitting:   150 / 82  (left arm) Cuff size:   regular  Vitals Entered By: Orlan Leavens (February 14, 2009 1:41 PM) CC: cough Is Patient Diabetic? No Pain Assessment Patient in pain? no        Primary Care Provider:  Newt Lukes MD  CC:  cough.  History of Present Illness: here today with complaints of dry cough onset of symptoms was 2.5 weeks ago. symptoms associated with   ache in both shoulder blades R>L symptoms improved by aleve and robitussin.  Current Medications (verified): 1)  None  Allergies (verified): 1)  ! Morphine 2)  ! * Sulfer 3)  ! * Codiene  Review of Systems  The patient denies fever, chest pain, syncope, and headaches.    Physical Exam  General:  alert, well-developed, well-nourished, and cooperative to examination.    Lungs:  normal respiratory effort, no intercostal retractions or use of accessory muscles; normal breath sounds bilaterally - no crackles and no wheezes.    Heart:  normal rate, regular rhythm, no murmur, and no rub. BLE without edema. Msk:  +tension medial to right scapula tender to palpation   Impression & Recommendations:  Problem # 1:  COUGH (ICD-786.2) resolving - exam benign and afeb cont antitussive and symptoms control -no abx  Problem # 2:  UNSPECIFIED MYALGIA AND MYOSITIS (ICD-729.1) likely related to cough - cont NSAIDs -  Patient Instructions: 1)  continue aleve for the back ache and tessalon perles (or other cough medicine) for the cough 2)  everything else looks good on your exam 3)  herbert's appointment has been moved to tues 10/19 3:15pm with dr. Wells Guiles team (the monday with dr. Alveda Reasons was cancelled) [facility] -

## 2010-06-11 NOTE — Assessment & Plan Note (Signed)
Summary: pneumonia shot/ per dr Chanice Brenton/[facility]   Nurse Visit     Allergies: 1)  ! Morphine 2)  ! * Sulfer 3)  ! * Codiene    Immunizations Administered:  Pneumonia Vaccine:    Vaccine Type: Pneumovax (Medicare)    Site: left deltoid    Mfr: Sanofi Pasteur    Dose: 0.5 ml    Route: IM    Given by: Orlan Leavens    Exp. Date: 07/03/2009    Lot #: [id number]    VIS given: 10/14/08   Orders Added: 1)  Pneumoccal Vaccine Adm- Medicare [G0009] 2)  Admin 1st Vaccine [14782]

## 2010-06-11 NOTE — Miscellaneous (Signed)
Summary: Orders Update   Clinical Lists Changes  Orders: Added new Service order of Est. Patient Level IV (99214) - Signed 

## 2010-06-11 NOTE — Assessment & Plan Note (Signed)
Summary: LUMP IN BREAST/NWS   Vital Signs:  Patient profile:   75 year old female Height:      61.5 inches Weight:      179 pounds BMI:     33.39 O2 Sat:      95 % Temp:     97.8 degrees F oral Pulse rate:   73 / minute BP sitting:   136 / 80  (left arm) Cuff size:   regular  Vitals Entered By: Windell Norfolk (August 20, 2008 4:04 PM) CC: pt thinks she felt a lump in her right breast   CC:  pt thinks she felt a lump in her right breast.  History of Present Illness: here after noticing a lump to the right medial lower aspect of the breast near the chest wall last wk, lump not particularly hard and is visible as well, but not hard or cystic or fatty as well it seems; no fever, trauma, no hx of prior breast lumps or cancer, no left breast symptoms  Problems Prior to Update: 1)  Breast Mass, Right  (ICD-611.72) 2)  Abdominal Pain  (ICD-789.00) 3)  Colonic Polyps, Hx of  (ICD-V12.72) 4)  Diverticulosis, Colon  (ICD-562.10) 5)  Diverticulitis, Hx of  (ICD-V12.79) 6)  Anxiety  (ICD-300.00) 7)  Anemia-nos  (ICD-285.9) 8)  Glucose Intolerance  (ICD-271.3) 9)  Hypertension  (ICD-401.9) 10)  Colon Cancer, Hx of  (ICD-V10.05) 11)  Hyperlipidemia  (ICD-272.4) 12)  Back Pain  (ICD-724.5) 13)  Syndrome, Carpal Tunnel  (ICD-354.0) 14)  Arthritis, Traumatic, 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 Take 1 Tablet By Mouth Once A Day 2)  Simvastatin 80 Mg Tabs (Simvastatin) .... Take 1 Tablet By Mouth Once A Day 3)  Vicodin 5-500 Mg  Tabs (Hydrocodone-Acetaminophen) .Marland Kitchen.. 1 By Mouth Qid Prn 4)  Flexeril 5 Mg  Tabs (Cyclobenzaprine Hcl) .Marland Kitchen.. 1 By Mouth Three Times A Day Prn 5)  Omeprazole 20 Mg  Tbec (Omeprazole) .Marland Kitchen.. 1 By Mouth Qd  Current Medications (verified): 1)  None  Allergies (verified): 1)  ! Morphine 2)  ! * Sulfer 3)  ! * Codiene  Past History:  Past Medical History:    Hyperlipidemia  Colon cancer, hx of    Hypertension    glucose intolerance    Anemia-NOS    Anxiety    hx of H pylori tx    hx of retinitis pigmentosa    Diverticulitis, hx of    Diverticulosis, colon    Colonic polyps, hx of     (05/09/2007)  Past Surgical History:    Appendectomy    s/p right hemicolectomy 11/04    Carpal tunnel release - left    Hysterectomy    Oophorectomy    Cataract extraction    s/p ganglion cyst     (05/09/2007)  Social History:    Never Smoked    Alcohol use-no     (05/09/2007)  Risk Factors:    Alcohol Use: N/A    >5 drinks/d w/in last 3 months: N/A    Caffeine Use: N/A    Diet: N/A    Exercise: N/A  Risk Factors:    Smoking Status: never (05/09/2007)    Packs/Day: N/A    Cigars/wk: N/A    Pipe Use/wk: N/A    Cans of tobacco/wk: N/A    Passive Smoke Exposure: N/A  Social History:    Reviewed history from 05/09/2007 and no changes required:  Never Smoked       Alcohol use-no  Review of Systems       all otherwise negative   Physical Exam  General:  alert and overweight-appearing but less so than one yr ago - lost 12 lbs per pt with better diet Head:  normocephalic and atraumatic.   Eyes:  vision grossly intact, pupils equal, and pupils round.   Ears:  R ear normal and L ear normal.   Nose:  no external erythema and no nasal discharge.   Mouth:  no gingival abnormalities and pharynx pink and moist.   Neck:  supple and no masses.   Breasts:  right breast with approx 1/2 cm subq lump with tan skin overlying, firm but not hard, not mobile, non tender, located medial to areala by approx 2 cm Lungs:  normal respiratory effort and normal breath sounds.   Heart:  normal rate and regular rhythm.   Extremities:  no edema, no ulcers    Impression & Recommendations:  Problem # 1:  BREAST MASS, RIGHT (ICD-611.72)  for diagnostic mammogram, somewhat suspicious - lesion itself looks post inflammatory type but cant r/o malginancy  Orders: Misc.  Referral (Misc. Ref)  Problem # 2:  HYPERTENSION (ICD-401.9)  The following medications were removed from the medication list:    Hydrochlorothiazide 25 Mg Tabs (Hydrochlorothiazide) .Marland Kitchen... Take 1 tablet by mouth once a day  BP today: 136/80 Prior BP: 149/64 (08/10/2007)  Labs Reviewed: Chol: 281 (08/23/2006)   HDL: 67.4 (08/23/2006)   LDL: DEL (08/23/2006)   TG: 229 (08/23/2006) stable overall by hx and exam, ok to continue meds/tx as is   Patient Instructions: 1)  You will be contacted about the referral(s) to: Diagnostic mammogram 2)  Continue all medications that you may have been taking previously  3)  Please schedule a follow-up appointment in 2 months with CPX labs

## 2010-06-11 NOTE — Miscellaneous (Signed)
Summary: [facility] Previsit/prep  Clinical Lists Changes  Medications: Added new medication of MOVIPREP 100 GM  SOLR (PEG-KCL-NACL-NASULF-NA ASC-C) As per prep instructions. - Signed Rx of MOVIPREP 100 GM  SOLR (PEG-KCL-NACL-NASULF-NA ASC-C) As per prep instructions.;  #1 x 0;  Signed;  Entered by: Wyona Almas RN;  Authorized by: Hilarie Fredrickson MD;  Method used: Electronically to [facility] [address]. [phone]*, [address] [place], [place], [place]  [address], Ph: [phone] or [phone], Fax: [phone] Observations: Added new observation of ALLERGY REV: Done (10/29/2008 10:25)    Prescriptions: MOVIPREP 100 GM  SOLR (PEG-KCL-NACL-NASULF-NA ASC-C) As per prep instructions.  #1 x 0   Entered by:   Wyona Almas RN   Authorized by:   Hilarie Fredrickson MD   Signed by:   Wyona Almas RN on 10/29/2008   Method used:   Electronically to        [facility] (retail)       [address]       [place], [place]  [address]       Ph: [phone] or [phone]       Fax: [phone]   RxID:   [id number]

## 2010-06-11 NOTE — Procedures (Signed)
Summary: EGD   EGD  Procedure date:  09/19/2007  Findings:      Location: [facility]    Patient Name: Kathryn Romero, Kathryn Romero MRN:  Procedure Procedures: Panendoscopy (EGD) CPT: 43235.  Personnel: Endoscopist: Wilhemina Bonito. Marina Goodell, MD.  Exam Location: Exam performed in Outpatient Clinic. Outpatient  Patient Consent: Procedure, Alternatives, Risks and Benefits discussed, consent obtained, from patient. Consent was obtained by the RN.  Indications Symptoms: Abdominal pain, location: epigastric.  History  Current Medications: Patient is not currently taking Coumadin.  Pre-Exam Physical: Performed Sep 19, 2007  Cardio-pulmonary exam, HEENT exam, Abdominal exam, Mental status exam WNL.  Comments: Pt. history reviewed/updated, physical exam performed prior to initiation of sedation?YES Exam Exam Info: Maximum depth of insertion Duodenum, intended Duodenum. Patient position: on left side. Vocal cords visualized. Gastric retroflexion performed. Images taken. ASA Classification: II. Tolerance: excellent.  Sedation Meds: Patient assessed and found to be appropriate for moderate (conscious) sedation. Fentanyl 75 mcg. given IV. Versed 5 mg. given IV. Cetacaine Spray 2 sprays given aerosolized.  Monitoring: BP and pulse monitoring done. Oximetry used. Supplemental O2 given  Findings ESOPHAGEAL INFLAMMATION: as a result of reflux. Severity is mild, erythema only.  ICD9: Esophagitis, Reflux: 530.11.  - Normal: Cardia to Duodenal 2nd Portion.   Assessment  Diagnoses: 530.11: Esophagitis, Reflux.  530.81: GERD.   Events  Unplanned Intervention: No unplanned interventions were required.  Unplanned Events: There were no complications. Plans Medication(s): PPI: Omeprazole/Prilosec 20 mg QD, for can purchase over the counter.   Disposition: After procedure patient sent to recovery. After recovery patient sent home.  Scheduling: Follow-up prn.     This report  was created from the original endoscopy report, which was reviewed and signed the above listed endoscopist.

## 2010-06-11 NOTE — Assessment & Plan Note (Signed)
Summary: ROV--$50--REQ'G LIVER TEST  STC   Vital Signs:  Patient profile:   75 year old female Height:      61.5 inches (156.21 cm) Weight:      168.6 pounds (76.64 kg) O2 Sat:      93 % Temp:     97.8 degrees F (36.56 degrees C) oral Pulse rate:   62 / minute BP sitting:   124 / 84  (left arm) Cuff size:   regular  Vitals Entered By: christy freeman-student CC: requesting hepatic and vitamin A levels to be checked/ also pt complaing of lump right toe Is Patient Diabetic? No Pain Assessment Patient in pain? no        Primary Care Provider:  Newt Lukes MD  CC:  requesting hepatic and vitamin A levels to be checked/ also pt complaing of lump right toe.  History of Present Illness: recent eye exam in [place] - retinitis pigmentosa dx - dr. Woody Seller [facility] rec starting high dose vit a (15,000u) daily wants LFTs and vit A level checked prior to inititating this tx pt already taking additional vit supp that includes 5,000 u vit a each day  also concerned with new "bump" on top of R great toe onset about 1 wwek ago not painful no injury to foot or toe no bite or new shoes bump is skin colored, no redness or brusing no other bumps on bottom of R foot, L foot or other body area  Current Medications (verified): 1)  None  Allergies (verified): 1)  ! Morphine 2)  ! * Sulfer 3)  ! * Codiene  Past History:  Past Medical History: Reviewed history from 05/09/2007 and no changes required. Hyperlipidemia Colon cancer, hx of Hypertension glucose intolerance Anemia-NOS Anxiety hx of H pylori tx hx of retinitis pigmentosa Diverticulitis, hx of Diverticulosis, colon Colonic polyps, hx of  Review of Systems  The patient denies anorexia, fever, chest pain, and peripheral edema.    Physical Exam  General:  alert, well-developed, well-nourished, and cooperative to examination.    Msk:  R great toe with firm flesh colored 1.5 cm nodule, noninflammed- located on  dorsal surface to medial side of tendon covering 1st DIP - no brusing or erthema - FROM R great toe joints and R MTPs/ankle - ?bursa cyst Skin:  no cellulitis - no ulceration - no other cysts except as described in MSkel section   Impression & Recommendations:  Problem # 1:  RETINITIS PIGMENTOSA (ICD-362.74) will check labs as requested and fax to MD in [place] when back (fax [phone]) mgmt per optho but pt advised to make sure he knows she already takes vit A 5,000 u  supp daily Orders: TLB-Hepatic/Liver Function Pnl (80076-HEPATIC) T- * Misc. Laboratory test [id number])  Problem # 2:  OTHER GANGLION&CYST OF SYNOVIUM TENDON&BURSA (ICD-727.49) benign exam of R great toe cyst reassurance provided - podiatry or ortho/spots med eval as needed but not necessary  Patient Instructions: 1)  we will check your Vitamin A level and liver function test today- these will be faxed to Dr. Ferne Reus when we have results for further managment of your eye condition. 2)  recommend evaluation of your right great toe lump by podiatrist or sports medicine doctor if it is bothering you. 3)  continue all other medications as you are doing without change

## 2010-06-11 NOTE — Assessment & Plan Note (Signed)
Summary: REFER TO PHONE NOTE/NML   Vital Signs:  Patient Profile:   75 Years Old Female Weight:      190 pounds Temp:     98.2 degrees F oral Pulse rate:   77 / minute BP sitting:   140 / 77  (right arm) Cuff size:   regular  Pt. in pain?   no  Vitals Entered By: Maris Berger (May 09, 2007 11:30 AM)                  Preventive Care Screening  Bone Density:    Date:  07/21/2005    Next Due:  07/2007    Results:  normal std dev  Colonoscopy:    Date:  07/28/2005    Next Due:  08/2008    Results:  abnormal   Last Pneumovax:    Date:  03/25/2003    Next Due:  03/2008    Results:  given    Chief Complaint:  E/R F/U.  History of Present Illness: seen in ER dec 28 - with sudden onset LBP on getting up from a chair, UA neg, now with pain radiating to the RLE below the knee to the calf; pain now about 8/10; legs feel week, cant be comfortable with sitting too long, and unable to get a good position lying down; also here with onset prod cough last week with low grade temp without sob, cp or wheezing, now somewhat improved  Current Allergies (reviewed today): ! MORPHINE ! * SULFER ! * CODIENE Updated/Current Medications (including changes made in today's visit):  AZITHROMYCIN 250 MG TABS (AZITHROMYCIN) Take 2 tablet by mouth HYDROCHLOROTHIAZIDE 25 MG TABS (HYDROCHLOROTHIAZIDE) Take 1 tablet by mouth once a day SIMVASTATIN 80 MG TABS (SIMVASTATIN) Take 1 tablet by mouth once a day VICODIN 5-500 MG  TABS (HYDROCODONE-ACETAMINOPHEN) 1 by mouth qid prn FLEXERIL 5 MG  TABS (CYCLOBENZAPRINE HCL) 1 by mouth three times a day prn   Past Medical History:    Reviewed history and no changes required:       Hyperlipidemia       Colon cancer, hx of       Hypertension       glucose intolerance       Anemia-NOS       Anxiety       hx of H pylori tx       hx of retinitis pigmentosa       Diverticulitis, hx of       Diverticulosis, colon       Colonic polyps,  hx of  Past Surgical History:    Reviewed history from 12/17/2006 and no changes required:       Appendectomy       s/p right hemicolectomy 11/04       Carpal tunnel release - left       Hysterectomy       Oophorectomy       Cataract extraction       s/p ganglion cyst   Family History:    Reviewed history and no changes required:       cousin with lou gehrigs       cousin with PMR  Social History:    Reviewed history and no changes required:       Never Smoked       Alcohol use-no   Risk Factors:  Tobacco use:  never Alcohol use:  no  Colonoscopy History:     Date of  Last Colonoscopy:  07/28/2005    Results:  abnormal     Physical Exam  General:     Well-developed,well-nourished,in no acute distress; alert,appropriate and cooperative throughout examination Head:     Normocephalic and atraumatic without obvious abnormalities. No apparent alopecia or balding. Eyes:     No corneal or conjunctival inflammation noted. EOMI. Perrla Ears:     External ear exam shows no significant lesions or deformities.  Otoscopic examination reveals clear canals, tympanic membranes are intact bilaterally without bulging, retraction, inflammation or discharge. Hearing is grossly normal bilaterally. Nose:     External nasal examination shows no deformity or inflammation. Nasal mucosa are pink and moist without lesions or exudates. Mouth:     mild erythema Neck:     No deformities, masses, or tenderness noted. Lungs:     Normal respiratory effort, chest expands symmetrically. Lungs are clear to auscultation, no crackles or wheezes. Heart:     Normal rate and regular rhythm. S1 and S2 normal without gallop, murmur, click, rub or other extra sounds. Msk:     non-tender bilat lumbar paravetebral Extremities:     no edema Neurologic:     No cranial nerve deficits noted. Station and gait are normal. Plantar reflexes are down-going bilaterally. DTRs are symmetrical throughout. Sensory,  motor and coordinative functions appear intact.    Impression & Recommendations:  Problem # 1:  BACK PAIN (ICD-724.5) now appears to be radicular - will tx as below and  check LS Spine MRI  Orders: Radiology Referral (Radiology)  Her updated medication list for this problem includes:    Vicodin 5-500 Mg Tabs (Hydrocodone-acetaminophen) .Marland Kitchen... 1 by mouth qid prn    Flexeril 5 Mg Tabs (Cyclobenzaprine hcl) .Marland Kitchen... 1 by mouth three times a day prn   Problem # 2:  BRONCHITIS-ACUTE (ICD-466.0)  Her updated medication list for this problem includes:    Azithromycin 250 Mg Tabs (Azithromycin) .Marland Kitchen... Take 2 tablet by mouth  tx as above  Complete Medication List: 1)  Azithromycin 250 Mg Tabs (Azithromycin) .... Take 2 tablet by mouth 2)  Hydrochlorothiazide 25 Mg Tabs (Hydrochlorothiazide) .... Take 1 tablet by mouth once a day 3)  Simvastatin 80 Mg Tabs (Simvastatin) .... Take 1 tablet by mouth once a day 4)  Vicodin 5-500 Mg Tabs (Hydrocodone-acetaminophen) .Marland Kitchen.. 1 by mouth qid prn 5)  Flexeril 5 Mg Tabs (Cyclobenzaprine hcl) .Marland Kitchen.. 1 by mouth three times a day prn   Patient Instructions: 1)  Take medications as prescribed 2)  continue all other medications 3)  you will be contacted about the MRI to be done    Prescriptions: FLEXERIL 5 MG  TABS (CYCLOBENZAPRINE HCL) 1 by mouth three times a day prn  #90 x 0   Entered and Authorized by:   Corwin Levins MD   Signed by:   Corwin Levins MD on 05/09/2007   Method used:   Print then Give to Patient   RxID:   [id number] VICODIN 5-500 MG  TABS (HYDROCODONE-ACETAMINOPHEN) 1 by mouth qid prn  #60 x 1   Entered and Authorized by:   Corwin Levins MD   Signed by:   Corwin Levins MD on 05/09/2007   Method used:   Print then Give to Patient   RxID:   [id number]  ]

## 2010-06-11 NOTE — Progress Notes (Signed)
Summary: pneumonia shot  Phone Note Outgoing Call   Call placed by: Orlan Leavens,  October 09, 2008 1:39 PM Call placed to: Patient Summary of Call: Pt wanted to know when was here last pneumonia injection. Per chart last injection was 03/24/08. Called pt to notifify her. Per Dr. Felicity Coyer ok for her to have pneumonia shot if she is do. Made appt for 10/14/08 @ 1:00. Initial call taken by: Orlan Leavens,  October 09, 2008 1:40 PM

## 2010-08-15 LAB — TISSUE CULTURE: Gram Stain: NONE SEEN

## 2010-08-15 LAB — CULTURE, ROUTINE-ABSCESS: Gram Stain: NONE SEEN

## 2010-08-15 LAB — FUNGUS CULTURE W SMEAR: Fungal Smear: NONE SEEN

## 2010-08-15 LAB — ANAEROBIC CULTURE
Gram Stain: NONE SEEN
Gram Stain: NONE SEEN

## 2010-08-15 LAB — AFB CULTURE WITH SMEAR (NOT AT ARMC): Acid Fast Smear: NONE SEEN

## 2010-08-15 LAB — POCT HEMOGLOBIN-HEMACUE: Hemoglobin: 12.4 g/dL (ref 12.0–15.0)

## 2010-08-20 ENCOUNTER — Encounter: Payer: Self-pay | Admitting: Internal Medicine

## 2010-08-20 ENCOUNTER — Telehealth: Payer: Self-pay | Admitting: Internal Medicine

## 2010-08-20 NOTE — Telephone Encounter (Signed)
Pt requests to see Dr Felicity Coyer tomorrow 4/13. Legs feel numb,losing balance,afraid she may fall. Requests work in Advertising account executive.

## 2010-08-20 NOTE — Telephone Encounter (Signed)
Pt has appt for 4/13 w/Dr Felicity Coyer as indicated.

## 2010-08-20 NOTE — Telephone Encounter (Signed)
Ok to work in tomorrow am - thanks

## 2010-08-21 ENCOUNTER — Other Ambulatory Visit (INDEPENDENT_AMBULATORY_CARE_PROVIDER_SITE_OTHER): Payer: Medicare Other

## 2010-08-21 ENCOUNTER — Ambulatory Visit (INDEPENDENT_AMBULATORY_CARE_PROVIDER_SITE_OTHER): Payer: Medicare Other | Admitting: Internal Medicine

## 2010-08-21 ENCOUNTER — Encounter: Payer: Self-pay | Admitting: Internal Medicine

## 2010-08-21 ENCOUNTER — Other Ambulatory Visit (INDEPENDENT_AMBULATORY_CARE_PROVIDER_SITE_OTHER): Payer: Medicare Other | Admitting: Internal Medicine

## 2010-08-21 VITALS — BP 112/72 | HR 59 | Temp 97.7°F | Ht 61.5 in | Wt 187.0 lb

## 2010-08-21 DIAGNOSIS — E785 Hyperlipidemia, unspecified: Secondary | ICD-10-CM

## 2010-08-21 DIAGNOSIS — E739 Lactose intolerance, unspecified: Secondary | ICD-10-CM

## 2010-08-21 DIAGNOSIS — Z79899 Other long term (current) drug therapy: Secondary | ICD-10-CM

## 2010-08-21 DIAGNOSIS — R200 Anesthesia of skin: Secondary | ICD-10-CM

## 2010-08-21 DIAGNOSIS — R209 Unspecified disturbances of skin sensation: Secondary | ICD-10-CM

## 2010-08-21 LAB — CBC WITH DIFFERENTIAL/PLATELET
Basophils Absolute: 0 10*3/uL (ref 0.0–0.1)
Basophils Relative: 0.9 % (ref 0.0–3.0)
Eosinophils Absolute: 0.2 10*3/uL (ref 0.0–0.7)
Eosinophils Relative: 4 % (ref 0.0–5.0)
HCT: 36.2 % (ref 36.0–46.0)
Hemoglobin: 12.6 g/dL (ref 12.0–15.0)
Lymphocytes Relative: 33 % (ref 12.0–46.0)
Lymphs Abs: 1.3 10*3/uL (ref 0.7–4.0)
MCHC: 34.8 g/dL (ref 30.0–36.0)
MCV: 88.7 fl (ref 78.0–100.0)
Monocytes Absolute: 0.3 10*3/uL (ref 0.1–1.0)
Monocytes Relative: 7.7 % (ref 3.0–12.0)
Neutro Abs: 2.1 10*3/uL (ref 1.4–7.7)
Neutrophils Relative %: 54.4 % (ref 43.0–77.0)
Platelets: 188 10*3/uL (ref 150.0–400.0)
RBC: 4.08 Mil/uL (ref 3.87–5.11)
RDW: 12.9 % (ref 11.5–14.6)
WBC: 3.9 10*3/uL — ABNORMAL LOW (ref 4.5–10.5)

## 2010-08-21 LAB — HEPATIC FUNCTION PANEL
ALT: 35 U/L (ref 0–35)
AST: 22 U/L (ref 0–37)
Albumin: 3.8 g/dL (ref 3.5–5.2)
Alkaline Phosphatase: 39 U/L (ref 39–117)
Bilirubin, Direct: 0.1 mg/dL (ref 0.0–0.3)
Total Bilirubin: 0.7 mg/dL (ref 0.3–1.2)
Total Protein: 6.6 g/dL (ref 6.0–8.3)

## 2010-08-21 LAB — BASIC METABOLIC PANEL
BUN: 20 mg/dL (ref 6–23)
CO2: 29 mEq/L (ref 19–32)
Calcium: 9.1 mg/dL (ref 8.4–10.5)
Chloride: 106 mEq/L (ref 96–112)
Creatinine, Ser: 0.6 mg/dL (ref 0.4–1.2)
GFR: 104.28 mL/min (ref >60.00)
Glucose, Bld: 91 mg/dL (ref 70–99)
Potassium: 4 mEq/L (ref 3.5–5.1)
Sodium: 142 mEq/L (ref 135–145)

## 2010-08-21 LAB — TSH: TSH: 1.23 u[IU]/mL (ref 0.35–5.50)

## 2010-08-21 LAB — LIPID PANEL
Cholesterol: 308 mg/dL — ABNORMAL HIGH (ref 0–200)
HDL: 84.2 mg/dL (ref >39.00)
Total CHOL/HDL Ratio: 4
Triglycerides: 152 mg/dL — ABNORMAL HIGH (ref 0.0–149.0)
VLDL: 30.4 mg/dL (ref 0.0–40.0)

## 2010-08-21 LAB — LDL CHOLESTEROL, DIRECT: Direct LDL: 192.7 mg/dL

## 2010-08-21 LAB — HEMOGLOBIN A1C: Hgb A1c MFr Bld: 5.9 % (ref 4.6–6.5)

## 2010-08-21 LAB — VITAMIN B12: Vitamin B-12: >1500 pg/mL — ABNORMAL HIGH (ref 211–911)

## 2010-08-21 MED ORDER — GABAPENTIN 100 MG PO CAPS: 100.0000 mg | ORAL_CAPSULE | Freq: Every day | ORAL | Status: DC

## 2010-08-21 NOTE — Assessment & Plan Note (Signed)
Weight gain reviewed - check labs including a1c now to look for DM the patient to work on diet and exercise changes for weight loss

## 2010-08-21 NOTE — Patient Instructions (Signed)
It was good to see you today. No evidence for stroke on exam today Test(s) ordered today. Your results will be called to you after review (48-72hours after test completion). If any changes need to be made, you will be notified at that time. Use gabapentin for nerve irritation as discussed - Your prescription(s) have been submitted to your pharmacy. Please take as directed and contact our office if you believe you are having problem(s) with the medication(s). Work on lifestyle changes as discussed (low fat, low carb diet diet; improved exercise efforts; weight loss) to control sugar, blood pressure and cholesterol levels and/or reduce risk of developing other medical problems. Please schedule followup in 3-4 months to check weight and review symptoms, call sooner if problems.

## 2010-08-21 NOTE — Progress Notes (Signed)
Subjective:    Patient ID: Kathryn Romero, female    DOB: 1935/06/11, 75 y.o.   MRN: [id number]  HPI  the patient complains of numbness affects both legs as well as both hands and feet Onset 3 weeks ago, gradual. symptoms unchanged since onset - no better or worse. symptoms constantly present day and night, activity or rest Not associated with weakness, headache, fever or rash - no falls No recent URI, travel or medication changes  Past Medical History  Diagnosis Date  . GLUCOSE INTOLERANCE   . RETINITIS PIGMENTOSA   . COLON CANCER, HX OF 05/09/2007  . ANEMIA-NOS   . ANXIETY   . HYPERLIPIDEMIA   . HYPERTENSION   . SYNDROME, CARPAL TUNNEL     Review of Systems  Constitutional: Positive for unexpected weight change (weight gain). Negative for fever and fatigue.  Eyes: Negative for visual disturbance.  Respiratory: Negative for shortness of breath.   Cardiovascular: Negative for chest pain and palpitations.  Genitourinary: Negative for dysuria.  Musculoskeletal: Negative for joint swelling and arthralgias.  Neurological: Negative for dizziness, tremors, syncope, facial asymmetry, speech difficulty, weakness and light-headedness.  Psychiatric/Behavioral: Negative for behavioral problems and confusion.       Objective:   Physical Exam BP 112/72  Pulse 59  Temp(Src) 97.7 F (36.5 C) (Oral)  Ht 5' 1.5" (1.562 m)  Wt 187 lb (84.823 kg)  BMI 34.76 kg/m2  SpO2 94% Physical Exam  Constitutional: She is oriented to person, place, and time. She appears well-developed and well-nourished. No distress.  HENT: Head: Normocephalic and atraumatic. Nose: Nose normal. Mouth/Throat: Oropharynx is clear and moist. No oropharyngeal exudate.  Eyes: Conjunctivae and EOM are normal. Pupils are equal, round, and reactive to light. No scleral icterus.  Neck: Normal range of motion. Neck supple. No JVD present. No thyromegaly present.  Cardiovascular: Normal rate, regular rhythm and  normal heart sounds.  No murmur heard. No BLE edema. Pulmonary/Chest: Effort normal and breath sounds normal. No respiratory distress. She has no wheezes.  Musculoskeletal: Normal range of motion. Back: full range of motion of thoracic and lumbar spine. Non tender to palpation. Negative straight leg raise. DTR's are symmetrically intact. Sensation intact in all dermatomes of the lower extremities. Full strength to manual muscle testing including the EHL, anterior tibialis, gastrocnemius, quadricepts, and iliopsoas. the patient is able to heel toe walk without difficulty and ambulates with a normal gait. Neurological: She is alert and oriented to person, place, and time. No cranial nerve deficit. Coordination normal. Equal and good hand grip. Skin: Skin is warm and dry. No rash noted. No erythema.  Psychiatric: She has a normal mood and affect. Her behavior is normal. Judgment and thought content normal.   Lab Results  Component Value Date   WBC 4.9 09/09/2008   HGB 12.4 12/12/2008   HCT 37.4 09/09/2008   PLT 159.0 09/09/2008   CHOL 232* 09/16/2008   TRIG 89.0 09/16/2008   HDL 63.00 09/16/2008   LDLDIRECT 149.0 09/16/2008   ALT 26 12/04/2008   AST 29 12/04/2008   TSH 1.44 08/23/2006   Wt Readings from Last 3 Encounters:  08/21/10 187 lb (84.823 kg)  04/16/09 168 lb 1.9 oz (76.258 kg)  04/14/09 170 lb (77.111 kg)       Assessment & Plan:  See problem list. Medications and labs reviewed today. Bilateral symptoms affecting both upper and lower extremities - no motor deficts on exam or red flags on hx Check labs rule out metabolic causes of  neuropathy now empiric gabapentin rx'd

## 2010-08-21 NOTE — Assessment & Plan Note (Signed)
Has previously declined statin or rx med for same - Recheck now with weight gain

## 2010-08-22 ENCOUNTER — Telehealth: Payer: Self-pay | Admitting: Internal Medicine

## 2010-08-22 NOTE — Telephone Encounter (Signed)
Please call patient - normal lab results except for high cholesterol - total 300 and LDL (bad) almost 200! Needs to work on diet, exercise and weight loss as discussed but no medication changes recommended. Thanks.

## 2010-08-24 ENCOUNTER — Telehealth: Payer: Self-pay

## 2010-08-24 NOTE — Telephone Encounter (Signed)
Patient called [facility] requesting lab results. Thanks

## 2010-08-24 NOTE — Telephone Encounter (Signed)
Called pt already no ansew LMOM RTC concerning labs

## 2010-08-24 NOTE — Telephone Encounter (Signed)
Called pt no ansew LMOM RTC concerning labs..08/24/10@4 :56pm/LMB

## 2010-08-25 ENCOUNTER — Other Ambulatory Visit: Payer: Self-pay | Admitting: Surgery

## 2010-08-25 DIAGNOSIS — M545 Low back pain, unspecified: Secondary | ICD-10-CM

## 2010-08-25 DIAGNOSIS — R200 Anesthesia of skin: Secondary | ICD-10-CM

## 2010-08-25 DIAGNOSIS — R202 Paresthesia of skin: Secondary | ICD-10-CM

## 2010-08-25 NOTE — Telephone Encounter (Signed)
Called pt no ansew LMOM RTC concerning labs..08/25/10@12 :15pm/LMB

## 2010-08-26 ENCOUNTER — Telehealth: Payer: Self-pay | Admitting: *Deleted

## 2010-08-26 NOTE — Telephone Encounter (Signed)
Sent letter to pt address concerning labs that was done on 08/21/10

## 2010-08-26 NOTE — Telephone Encounter (Signed)
Called pt again still no ansew mailing out pt letter concerning lab results.Marland KitchenMarland Kitchen4/18/12@8 :37am/LMB

## 2010-08-27 ENCOUNTER — Ambulatory Visit
Admission: RE | Admit: 2010-08-27 | Discharge: 2010-08-27 | Disposition: A | Discharge status: Home / Self Care | Payer: Medicare Other | Source: Ambulatory Visit | Attending: Surgery | Admitting: Surgery

## 2010-08-27 DIAGNOSIS — R200 Anesthesia of skin: Secondary | ICD-10-CM

## 2010-08-27 DIAGNOSIS — M545 Low back pain, unspecified: Secondary | ICD-10-CM

## 2010-09-22 NOTE — H&P (Signed)
Kathryn, Romero           ACCOUNT NO.:  [id number]   MEDICAL RECORD NO.:  [id number]          PATIENT TYPE:  AMB   LOCATION:  [facility]SC                          FACILITY:  [facility]   PHYSICIAN:  Katy Fitch. Sypher, M.D. DATE OF BIRTH:  [birth year]   DATE OF ADMISSION:  12/12/2008  DATE OF DISCHARGE:                              HISTORY & PHYSICAL   CHIEF COMPLAINT:  A 48-week-old penetrating wound right index finger with  abscess formation, pain, swelling, and inability to flex the right index  finger, rule out abscess and flexor sheath infection.   HISTORY OF PRESENT ILLNESS:  Kathryn Romero is a well-known 72-year-  old woman who has been a patient with our practice in the past.  She is  an excellent state of health.  She accidentally stabbed her right index  finger proximal phalangeal segment on a metal spindle 3 weeks prior.  Her wound was benign for 2 weeks but then in the past week has developed  rubor, swelling, and what appears to be abscess on the radial aspect of  her right index finger proximal phalangeal segment.  She was unable to  fully extend the finger nor fully flex the finger and was tender on  palpation of the flexor sheath.  She became concerned and contacted our  office requesting an immediate hand surgery consult.  We saw her on an  urgent basis at the [facility] to evaluate her predicament   Her past medical history is reviewed in detail.  She has multiple drug  allergies including SULFA, CODEINE, PENICILLIN, MORPHINE SULFATE.  She  cannot recall all of her responses to these medicines as her penicillin  allergy dates back to childhood.  She reports that she has had prior  surgery for colon cancer in 2004, an appendectomy as a child, cataract  surgery, and a previous carpal tunnel release.  She has had multiple  ganglions.   Her social history reveals that she is married.  She is a nonsmoker,  does not drink alcoholic beverages, and denies  recreational drugs.   Her family history is detailed, otherwise noncontributory.   Her review of systems reveals HEENT partial plates.  Her pulmonary  history, no history of asthma, bronchitis, or recent pneumonia.  She did  have a respiratory infection in the spring of 2010 and had a chest x-ray  obtained which was notable for no acute cardiopulmonary disease.  Her  film was positive for degenerative disk disease in the visible segments  of her spine.  Her cardiovascular, neurologic, GI, endocrine, and  psychologic review of systems were all negative.  She is postmenopausal.  She has had no hematology difficulties and her only musculoskeletal  complaints at this time included a possible Baker cyst behind her right  knee, a sister mucoid cyst at her right great toe IP joint, and a  history of prior cysts on the dorsal aspect of her left wrist.   She has no urinary history such as stones, frequency, nocturia, or  hematuria.   Physical examination reveals an awake and alert 75 year old woman.  She  is 5 feet 1  inch tall and weighs 75.75 kg.  Her O2 saturation is 98%  room air.  Her pulse 80 and regular, respirations 16.  Her blood  pressure is recorded on the Dinamap and was recorded in the chart on the  surgical record.   Her general exam revealed an awake and alert 75 year old woman in no  distress.  HEENT was negative.  Chest was clear.  Heart regular rate and  rhythm.  Abdomen soft.  Normal bowel sounds.  GU exam was deferred.  Examination of her extremities revealed the aforementioned possible  abscess and flexor sheath infection with a flexed posture of her finger.  No evidence of ascending lymphangitis or lymphadenopathy.  Her  neurologic exam was intact.  She was awake and alert, oriented x3.  Gait  in stance were normal.  Her motor and sensory exam was normal in all  four limbs.  Her skin exam was unremarkable except for the  aforementioned finger findings.   Palpation of  her right knee revealed a 2-cm diameter cyst adjacent to  the lateral hamstring tendon in the popliteal fossa consistent with a  possible Baker cyst.  She also had a 1.5-cm diameter cyst in her right  great toe.  She had a healed surgical incision from prior carpal tunnel  release.   Plain x-ray of her finger demonstrated normal bony anatomy.  There is no  sign of erosion, sequestrum, or involucrum.   ASSESSMENT:  A 3 weeks status post puncture wound with an abscess, rule  out atypical infection versus aerobic or anaerobic abscess.   PLAN:  We will take Ms. Bonura to the operating room for immediate  incision and drainage of her abscess.  Pulses are anticipated for  aerobic and anaerobic growth as well as AFB and fungus.  We will send  tissue samples for AFB and fungal growth.   While cultures are pending, we will cover her for staph and atypical  organisms with a combination of doxycycline and Biaxin.  She should be  able to tolerate this with her allergy profile.      Katy Fitch Sypher, M.D.  Electronically Signed     RVS/MEDQ  D:  12/12/2008  T:  12/13/2008  Job:  [id number]

## 2010-09-22 NOTE — Op Note (Signed)
Kathryn Romero, Kathryn Romero           ACCOUNT NO.:  [id number]   MEDICAL RECORD NO.:  [id number]          PATIENT TYPE:  AMB   LOCATION:  DSC                          FACILITY:  [facility]   PHYSICIAN:  Katy Fitch. Sypher, M.D. DATE OF BIRTH:  [birth year]   DATE OF PROCEDURE:  12/12/2008  DATE OF DISCHARGE:                               OPERATIVE REPORT   PREOPERATIVE DIAGNOSES:  A 3 weeks status post metal spindle puncture  wound, right index finger proximal phalangeal segment with development  of late rubor, pain, swelling, and signs of ascending cellulitis and  flexor sheath infection.   POSTOPERATIVE DIAGNOSES:  1. Probable ascending cellulitis.  2. Questionable flexor sheath infection.   OPERATIONS:  1. Incision and drainage of abscess, radial aspect of right index      finger P1 segment with cultures for aerobic, anaerobic, acid-fast      bacillus, and fungus.  2. Irrigation of right index finger flexor sheath with a #5 pediatric      feeding tube with synovectomy for acid-fast and fungal culture.   OPERATING SURGEON:  Katy Fitch. Sypher, MD   ASSISTANT:  Marveen Reeks Dasnoit, PA-C   ANESTHESIA:  General by LMA technique.   SUPERVISING ANESTHESIOLOGIST:  Germaine Pomfret, MD   INDICATIONS:  Kathryn Romero is a 75 year old woman well acquainted  with our practice.   She called earlier today stating that she was having acute pain,  swelling, rubor, and possible abscess formation in her right index  finger.   She reported accidentally stabbing her right index finger on a metal  spindle used for holding receives 3 weeks prior.  She did not have any  immediate response following the puncture wound.  She did report  swelling, rubor, and pain.  She observed the wound for 2 weeks and noted  gradual development of erythema.   In the past 48 hours, she has had rather remarkable increase in her  swelling and pain and now has appears to be an abscess on the radial  aspect of her  right index finger proximal phalangeal segment.  She  called our office and requested an urgent consult.   She was seen at the [facility] on an urgent basis.  She was  noted to have breakfast at 8:30 a.m.  This consisted of a protein shake.  She was noted to have a moderately warm abscess on the radial aspect of  right index finger and swelling of the palmar surface of the finger and  a flexed posture consistent with a flexor sheath infection.  She had  mild Kanavel signs of tenosynovitis.  She was tender over the A1 pulley  in the palm.   Plain x-rays of her finger demonstrated soft tissue swelling.  There is  no evidence of obvious osteomyelitis or lysis of the proximal phalangeal  cortex.   We advised her to undergo incision and drainage of her abscess and  irrigation of her flexor sheath.  We anticipate culturing for aerobic,  anaerobic, acid-fast, fungal, and other atypical organisms due to her  late presentation.   After informed consent, she is brought  to the operating room at this  time.  Preoperatively, history and physical was accomplished in the  holding area and Dr. Jean Rosenthal of the Anesthesia Service also interviewed  Kathryn Romero and recommended proceeding with general anesthesia by LMA  technique.   PROCEDURE:  Kathryn Romero was brought to the operating room and  placed in supine position on the operating table.   Following the induction of general anesthesia by LMA technique, the  right arm was prepped with Betadine soap solution and sterilely draped.  A pneumatic tourniquet was applied to the proximal forearm.   Following elevation of the hand and arm for 1 minute and direct  compression of the forearm, the arterial tourniquet was inflated to 230  mmHg.  The procedure commenced with an oblique incision directly over  the abscess.  Subcutaneous tissues were carefully dissected revealing a  cold, indurated, granulomatous-type wound.  This was carefully  spread  with a hemostat and frankly purulent material was recovered.  There was  some saponified fat present.  This wound was cultured for aerobic and  anaerobic growth and specimens of the soft tissue were placed in a  sterile cup and sent for AFB and fungal cultures.  The flexor sheath was  draining seropurulent fluid.   With new instruments, a transverse incision was fashioned below the A1  pulley proximally and a hockey-stick-type incision at the A4, A5  junction distally.  The C3 pulley was taken down, and a #5 pediatric  feeding tube threaded within the flexor sheath.  The flexor sheath was  irrigated copiously with sterile saline until the effluent was clear.   Great care was taken to use the hemostat to spread the tissues  surrounding the flexor sheath to be certain that no purulent material  remained.   The tendons were delivered and a synovectomy accomplished.  A  microrongeur was used to strip the synovium off the flexor tendons.  The  synovium was then placed in a sterile saline and passed off for aerobic  and anaerobic, acid-fast, and fungal growth.   The wound was then irrigated thoroughly followed by dressing of all 3  wounds open and suturing of #5 pediatric feeding tube, which was left  deep to the A2 pulley.  There were no apparent complications.   Kathryn Romero tolerated the surgery and anesthesia well.  She will be  discharged in a voluminous hand dressing to home with prescriptions for  Darvocet N-100 one p.o. q.4-6 hours p.r.n. pain, #30 tablets without  refill.  She will use  Advil as needed.  For antibiotic coverage at this time, she is placed on  Biaxin 500 mg p.o. b.i.d. and doxycycline 100 mg p.o. b.i.d.  We will  cover MRSA, atypical organisms, and community strep with this initial  prescription.   We will await our culture results.      Katy Fitch Sypher, M.D.  Electronically Signed     RVS/MEDQ  D:  12/12/2008  T:  12/13/2008  Job:  [id number]

## 2010-09-22 NOTE — Assessment & Plan Note (Signed)
HEALTHCARE                         GASTROENTEROLOGY OFFICE NOTE   Kathryn Romero, Kathryn Romero                  MRN:          [id number]  DATE:09/04/2007                            DOB:          1935-05-21    REFERRING PHYSICIAN:  Corwin Levins, MD   PHYSICIAN REQUESTING CONSULTATION:  Corwin Levins, MD   REASON FOR EVALUATION:  Abdominal pain.   HISTORY:  This is a 75 year old female with a personal history of colon  cancer for which she is status post right hemicolectomy in 2004,  surveillance colonoscopy with diminutive colon polyp in 2006, incidental  diverticulosis, hypertension, and dyslipidemia.  The patient reports to  me intermittent problems with abdominal pain.  Generally in the  epigastric region though occasionally in the periumbilical region.  She  describes it as gnawing discomfort which can last approximately 15  minutes.  There are no exacerbating factors such as food, activity or  stress. As well no particular relieving factors.  She does notice that  she has had increased intestinal gas in the form of bloating since her  colectomy.  She has regular bowel movements daily.  She denies any  recent change in bowel habits, melena or hematochezia.  Despite dieting,  no weight loss.  She is not taking her prescription medications.  She  was given a proton pump inhibitor but did not use this.  Her non GI  review of systems is otherwise negative.   PAST MEDICAL HISTORY:  As above.   PAST SURGICAL HISTORY:  In addition to her hemicolectomy, the patient  has undergone apparently, carpal tunnel release, hysterectomy,  oophorectomy, ganglion cyst removal and cataract extraction.   FAMILY HISTORY:  Noncontributory.   SOCIAL HISTORY:  As per outlined in the chart.  No changes.  She does  not smoke or use alcohol.   ALLERGIES:  MORPHINE, SULFA and CODEINE.   CURRENT MEDICATIONS:  The patient just takes vitamin supplements.   PHYSICAL  EXAMINATION:  Well-appearing female in no acute distress.  Her blood pressure is 110/76, heart rate is 92 and regular, respirations  17 and unlabored.  Her weight is 185.6 pounds.  She is 5 feet, 1-1/2  inch in height.  HEENT:  Sclerae anicteric, conjunctivae are pink.  The oral mucosa is  intact.  There is no thrush.  There is no adenopathy.  Thyroid is  normal.  LUNGS:  Clear to auscultation and percussion.  HEART:  Regular without murmur.  ABDOMEN:  Obese and soft with mild tenderness in the epigastric region  to deep palpation.  No mass felt.  Good bowel sounds heard.  No  hepatosplenomegaly, no hernia.  RECTAL EXAM:  Omitted.  EXTREMITIES:  Without clubbing, cyanosis or edema, good pulses.  NEUROLOGIC:  There is no focal deficits.   IMPRESSION:  1. Intermittent problems with epigastric discomfort.  Possibly acid      peptic in nature.  Rule out peptic ulcer disease or reflux.      Alternatively, this could be discomfort due to adhesions or even      functional.  2. Intermittent problems with bloating.  May have  bacterial      overgrowth.  Could be an element of irritable bowel.  3. Personal history of colon cancer.  4. Colonoscopy in 2007.  Surveillance exam due in March of 2010.  5. Incidental diverticulosis.  6. General medical problems under the care of Dr. Jonny Ruiz.   RECOMMENDATIONS:  1. Empiric trial of the probiotic Align to see if this helps with the      gas.  2. Schedule diagnostic upper endoscopy to exclude acid peptic      disorders as a cause for discomfort.  3. May consider an empiric trial of proton pump inhibitor depending      upon endoscopic findings and her symptoms.  4. Plan is for surveillance colonoscopy next year.  5. Ongoing general medical care with Dr. Jonny Ruiz.     Wilhemina Bonito. Marina Goodell, MD  Electronically Signed    JNP/MedQ  DD: 09/04/2007  DT: 09/04/2007  Job #: [id number]

## 2010-09-25 NOTE — Consult Note (Signed)
NAME:  Kathryn Romero, Kathryn Romero                     ACCOUNT NO.:  [id number]   MEDICAL RECORD NO.:  [id number]                   PATIENT TYPE:  INP   LOCATION:  0446                                 FACILITY:  [facility]   PHYSICIAN:  Valentino Hue. Magrinat, M.D.            DATE OF BIRTH:  1935-08-06   DATE OF CONSULTATION:  04/12/2003  DATE OF DISCHARGE:                                   CONSULTATION   HISTORY OF PRESENT ILLNESS:  Ms. Hoch is a 75 year old white female who  we are seeing in consultation at the request of Dr. Derrell Lolling because of her  recent diagnosis of colon cancer.  Ms. Yearwood was in her usual state of  health until a few months ago when she started craving ice and then later  began experiencing some periumbilical discomfort.  She saw Dr. Oliver Barre at  [facility] for the pain around her navel and apparently had a battery of  tests which revealed a hemoglobin of approximately 8.  Ms. Skerritt was sent  to Dr. Marina Goodell and had an EGD and a colonoscopy.  She was treated for H.  pylori with Prevpac because of positive test and then on colonoscopy, that  was done approximately two weeks ago, she was found to have a lesion which  showed adenocarcinoma on biopsy.  She then underwent right colectomy on  April 08, 2003.  The pathology revealed moderately differentiated colon  adenocarcinoma with extension through the full thickness of the muscular  wall into the mesenteric adipose tissue, and 24 of 24 lymph nodes were  negative.  This is a T3 N0 MX tumor that was the size of 5.5 x 5 x 1.2 cm in  diameter.  She was transfused two units of packed red cells after her  surgery this week because her hemoglobin had dropped down to 7.2.   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  She denies any history of heart disease.  No hypertension,  history of cancer.  No  diabetes, tuberculosis, previous stomach problems.  No anemias in the past.   PAST SURGICAL HISTORY:  Positive for left hand carpal tunnel repair, TAH-BSO  at 75 years of age, ganglion cyst removal on the right hand, cataract  surgery, and appendix out when she was a child.   FAMILY HISTORY:  Her mother is alive and well at 74 years old.  She is in a  wheelchair because of muscle atrophy.  Her mother has had diabetes and had a  benign brain tumor years ago.  Her father died at 22.  He had a history of  strokes and possibly arthritis.  She has three living brothers, age 55, 33,  and 35; one of whom has retinitis pigmentosa.  One brother was killed in a  car wreck.  She has one daughter  that was adopted out at birth and she does  not know any health history on this child.   GYN HISTORY:  She is G1, P1, and again is status post hysterectomy.   SOCIAL HISTORY:  Ms. Schindler is originally from [place].  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  She has been doing this for many  years and seems to love her job.  She does yoga at least twice a week and  attends church regularly at [employer] or the [employer].  She  does not drive because of her eye problems.  She was married to her first  husband for 22 years.  He passed away years ago and she has remarried now to  her husband, Caro Laroche, for seven years.  He is 73 years old and they lead a very  active lifestyle with dancing and other activities.  She has stepchildren  that live far away, some in [place], [place], from her first marriage.  She  has lots of supportive friends in the area.   HEALTH MAINTENANCE:  She does get yearly mammograms, the last one was this  year at [facility] here in [place].  She has not had a Pap smear  since her TAH.  She is uncertain of her cholesterol level as she has not had  normal routine labs done until  recently when she was found to be anemic.  She denies smoking and occasionally has an alcoholic drink.  She has only  had blood transfusion during this hospitalization.  She got her flu shot  this year for the very first time.  She has had a pneumonia shot in the past  but does not remember when it was.  She thinks she had a bone density scan  within the past few weeks.   REVIEW OF SYSTEMS:  She has been in excellent health and until the recent  pica and periumbilical pain, has been feeling fine.  No headaches,  dizziness, blurred vision except for problems with glaring of light.  No  earache, sore throats, allergy symptoms.  No problems swallowing.  No  abdominal discomfort presently.  No nausea, vomiting, constipation, or  diarrhea.  No melena or hematochezia.  No chest pain, palpitations or  shortness of breath.  No dysuria, urgency, frequency.  No fevers, night  sweats, chills, or weight loss.  She denies any extreme fatigue.   CURRENT MEDICATIONS:  Fish oil and multivitamin each day.   ALLERGIES:  She has adverse reactions to CODEINE, MORPHINE, and DARVOCET  that cause palpitations and nausea.  SULFA causes nausea but no true  medication allergies that she is aware of.   PHYSICAL EXAMINATION:  GENERAL:  She is alert and oriented, in no acute  distress, very amiable female.  VITAL SIGNS:  Temp is 98.5, pulse 96, respirations 20, BP is 113/86, weight  is 185 pounds.  HEENT:  PERRLA, EOMI, no icterus.  Buccal mucosa is moist without evidence  of lesions.  NECK:  Supple without palpable adenopathy.  HEART:  Regular rate and rhythm without murmur.  LUNGS:  Clear to auscultation without crackles, wheezes, rales, or rhonchi.  ABDOMEN:  She has a bandage over most of her abdomen.  No masses or  organomegaly are palpable.  She is generally tender in the entire abdominal  area.  Bowel sounds are positive.  No inguinal adenopathy is palpable.  No  axillary adenopathy. EXTREMITIES:  No  edema, cyanosis, or clubbing.  Pedal pulses are within  normal limits.  NEURO:  CN II-XII is grossly intact.  No focal motor deficits are obvious.   LABORATORY DATA:  Labs on April 10, 2003:  CBC; white count was 7.2,  hemoglobin 9.0, hematocrit was 27.8, platelet count was 240.  On March 30, 2003:  Hemoglobin and hematocrit were 7.2 and 23.4, and she was given  two units of packed red cells.  On April 10, 2003:  Sodium was 135,  potassium was 3.4, chloride 106, CO2 was 28, glucose 134, BUN was 10,  creatinine 0.6, calcium was 7.9.   Pathology report is noted above.  Case number is [id number] and it reveals  moderately differentiated colon adenocarcinoma of tumor located in the  cecum, T3 NO MX.   She had a chest x-ray on April 02, 2003, that revealed no active disease.   On April 05, 2003, CT of the abdomen and pelvis revealed a cecal mass  with several local mildly enlarged lymph nodes in the mesentery adjacent to  the cecum along the ileocolic artery and sigmoid diverticulosis was noted.  No metastatic disease is present.   ASSESSMENT:  1. Colon cancer status post right colectomy on April 08, 2003.  2. Anemia secondary to #1 with associated symptom of pica, status post two     units of packed red blood cells.  3. Retinitis pigmentosa.  4. Status post total abdominal hysterectomy and bilateral salpingo-     oophorectomy.   PLAN:  1. We will obtain CBC with diff, CMET, LDH, and CEA.  2. Dr. Darnelle Catalan will review the patient's case in further detail and will     make recommendations as far as Oncology treatment goes.     Melony Overly, PA                          Valentino Hue. Magrinat, M.D.    TH/MEDQ  D:  04/12/2003  T:  04/12/2003  Job:  [id number]

## 2010-09-25 NOTE — Discharge Summary (Signed)
NAME:  Kathryn Romero, Kathryn Romero                     ACCOUNT NO.:  [id number]   MEDICAL RECORD NO.:  [id number]                   PATIENT TYPE:  INP   LOCATION:  0446                                 FACILITY[facility]   PHYSICIAN:  Angelia Mould. Derrell Lolling, M.D.             DATE OF BIRTH:  06-04-1935   DATE OF ADMISSION:  04/08/2003  DATE OF DISCHARGE:  04/15/2003                                 DISCHARGE SUMMARY   FINAL DIAGNOSES:  1. Carcinoma of the cecum, stage T3 N0.  2. Retinitis pigmentosa.  3. Anemia of chronic disease, secondary to #1.  4. Status post total abdominal hysterectomy, bilateral salpingo-     oophorectomy, and appendectomy, remote.   OPERATIONS PERFORMED:  Right colectomy on April 08, 2003.   HISTORY:  This is a 75 year old white female who began to have some  abdominal discomfort and was found to be anemic.  Upper endoscopy showed a  small hiatal hernia and a positive CLO biopsy.  She was placed on Prevpac  and her pain resolved.  She also underwent colonoscopy by Dr. Yancey Flemings and  he found a 4 cm diameter malignant-appearing tumor in the cecum near the  ileocecal valve.  Biopsies showed adenocarcinoma.  I was asked to see her as  an outpatient.  Right colectomy was advised, agreed upon, and scheduled.  She underwent bowel prep at home and was brought to the hospital electively.   PHYSICAL EXAMINATION:  GENERAL:  A pleasant, slightly overweight woman in no  acute distress.  VITAL SIGNS:  Weight 184 pounds, height 5 feet 1.5 inches.  NECK:  No adenopathy or mass.  LUNGS:  Clear to auscultation.  HEART:  Regular rate and rhythm, no murmur.  ABDOMEN:  Soft, somewhat obese, nontender.  Right lower quadrant scar.  Pfannenstiel incision is well-healed.  No mass or hernia.  Liver and spleen  not enlarged.   HOSPITAL COURSE:  On the day of admission, the patient was taken to the  operating room and underwent a right colectomy.  She had a 4 to 5 cm hard  mass at the  ileocecal valve.  There was no obvious adenopathy.  There was no  signs of any metastatic disease.  Right colectomy was performed.   Pathology report showed a moderately differentiated colonic adenocarcinoma  with extension through the full thickness of the muscular wall into the  mesenteric adipose tissue making this a T3 tumor.  Twenty-four out of 24  lymph nodes were negative for cancer.  This was read as a T3 N0 tumor.   Postoperatively, the patient did well.  She progressed in her diet and  activities without any complications.  She did not have very much pain,  resumed bowel function and ambulation, and was ready to be discharged on  April 15, 2003.   The patient was seen in the hospital by Dr. Jeanette Caprice and outpatient  followup was arranged with him.   On  the date of discharge, she was feeling well, was afebrile, was having  bowel movements, was tolerating a regular diet, and wound was looking good.  She was given a prescription for Vicodin and for Phenergan at her request.  She was asked to follow up with me in the office in two weeks, and she was  going to see Dr. Darnelle Catalan in four weeks.                                               Angelia Mould. Derrell Lolling, M.D.    HMI/MEDQ  D:  05/01/2003  T:  05/01/2003  Job:  [id number]   cc:   Corwin Levins, M.D. [facility]   Wilhemina Bonito. Marina Goodell, M.D. [facility]   Valentino Hue. Magrinat, M.D.  [address] [facility]  [place]  [place] [address]  Fax: [phone]

## 2010-09-25 NOTE — Op Note (Signed)
NAME:  Kathryn Romero, Kathryn Romero                     ACCOUNT NO.:  [id number]   MEDICAL RECORD NO.:  [id number]                   PATIENT TYPE:  OUT   LOCATION:  XRAY                                 FACILITY:  [facility]   PHYSICIAN:  Angelia Mould. Derrell Lolling, M.D.             DATE OF BIRTH:  May 13, 1935   DATE OF PROCEDURE:  04/08/2003  DATE OF DISCHARGE:  04/05/2003                                 OPERATIVE REPORT   PREOPERATIVE DIAGNOSIS:  Carcinoma of the right colon.   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   OPERATION:  Right colectomy.   SURGEON:  Angelia Mould. Derrell Lolling, M.D.   FIRST ASSISTANT:  Timothy E. Earlene Plater, M.D.   INDICATIONS FOR PROCEDURE:  This is a 75 year old white female who was found  to have anemia and some vague abdominal  discomfort. An upper endoscopy  showed a small hiatal hernia and her clo biopsy was positive. She has been  placed on a Prevpac and her pain has resolved. She also underwent  colonoscopy by Dr. Yancey Flemings and he found a 4-cm malignant appearing tumor  in the cecum. Biopsy showed adenocarcinoma. A CT scan shows a mass in the  cecum and possibly enlarged lymph nodes along the ileocolic artery.  Otherwise there is no sign of any metastatic disease. She has undergone a  bowel preparation at home and is brought to the operating room electively  for colon resection.   FINDINGS:  The patient had  about a 5-cm tumor of the cecum right at the  ileocecal valve. This looks like it might be invading the terminal ileum  slightly. I did not feel any obviously enlarged lymph nodes but did a wide  lymph node resection nevertheless. There were some adhesions in the right  lower quadrant, presumably from her previous surgery. This was involving  several loops of small bowel and omentum that required a fair amount of  adhesion lysis to clear things up. She also had  some adhesions of the  omentum tethering the ascending colon to the  transverse colon which had to  be taken down to clarify the anatomy. The liver and gallbladder looked and  felt normal. The duodenum and stomach  looked normal. The small intestine  looked normal. There was no peritoneal mass and there was no ascites.   DESCRIPTION OF PROCEDURE:  Following the induction of general endotracheal  anesthesia the patient's abdomen was prepped and draped in a sterile  fashion. A transverse incision was made in the right abdomen  at just above  the level of the umbilicus. Dissection was carried down through the  subcutaneous tissue. The muscle layers were divided with electrocautery and  the abdominal  cavity entered and explored with the findings as described  above.   We had to spend about 15 minutes taking all the adhesions down. We then  mobilized the terminal ileum, right colon and hepatic flexure by  dividing  the lateral peritoneal attachments. We fully mobilized the right colon and  hepatic flexure up into the midline. We  were very careful to avoid injury  to the duodenum or ureter.   We transected the terminal ileum with a GIA stapling device approximately 5  inches proximal  to the ileocecal valve. We transected the transverse colon  with a GIA stapling device just to the right of the middle colic vessels. We  scored the mesentery as widely as possible and then took down all the  mesenteric vessels between  clamps, dividing these vessels and then ligating  them with 2-0 silk ties. The ileocolic vessel was large and was doubly  ligated.   The specimen was removed. Dr. Jimmy Picket in pathology looked at this  and  said that this was a 5-cm tumor just beginning to invade the terminal ileum,  but that we had excellent margins on both sides.   An anastomosis was created between the terminal ileum and the mid transverse  colon with a GIA stapling device. Hemostasis was good. The defect left in  the bowel wall was closed with a TA-60 stapling  device.   At this point we changed all of our instruments  and gloves. We placed  several reinforcing sutures of 3-0 silk to reinforce the staple lines at  strategic points. The mesentery was closed with figure-of-8 sutures of 2-0  silk. There was no sign of any ischemia of  the  bowel.   We then irrigated the abdomen and pelvis copiously with about 3 liters of  saline. The irrigation fluid was clear. We checked for  bleeding and found  no bleeding whatsoever. The intestine and omentum were returned to their  anatomic positions. The posterior rectus sheath, internal oblique and  transversus abdominus muscles were closed in a single layer with running  suture of #1 PDS. The anterior rectus sheath, external oblique and linea  alba were closed in a single layer with a running suture of #1 PDS. The  wounds were irrigated with saline. The skin was closed with skin staples.   Clean bandages were placed and the patient was taken to the recovery room in  stable condition. Estimated blood loss was about 150 mL. There were no  complications. All sponge, instrument and needle counts were correct.                                               Angelia Mould. Derrell Lolling, M.D.    HMI/MEDQ  D:  04/08/2003  T:  04/08/2003  Job:  [id number]   cc:   Corwin Levins, M.D. [facility]   Wilhemina Bonito. Marina Goodell, M.D. [facility]

## 2010-09-25 NOTE — Op Note (Signed)
[place]  Patient:    Kathryn, Romero Visit Number: [id number] MRN: [id number]          Service Type: DSU Location: [facility] Attending Physician:  Susa Day Dictated by:   Katy Fitch Naaman Plummer., M.D. Proc. Date: 08/29/01 Admit Date:  08/29/2001 Discharge Date: 08/29/2001   CC:         Corwin Levins, M.D. [facility]   Operative Report  PREOPERATIVE DIAGNOSIS: 1. Entrapment neuropathy, median nerve, left carpal tunnel. 2. Mucous cyst, right long finger with degenerative arthritis, right long    finger distal interphalangeal joint.  POSTOPERATIVE DIAGNOSIS: 1. Entrapment neuropathy, median nerve, left carpal tunnel. 2. Mucous cyst, right long finger with degenerative arthritis, right long    finger distal interphalangeal joint.  OPERATION PERFORMED: 1. Release of left transverse carpal ligament. 2. Excision of right long finger mucous cyst and distal interphalangeal joint    debridement with osteophyte excision from distal phalanx.  OPERATING SURGEON:  Josephine Igo, M.D.  ASSISTANT:  Annye Rusk, P.A-C.  ANESTHESIA:  General by LMA.  SUPERVISING ANESTHESIOLOGIST:  Dr. Gelene Mink.  INDICATIONS:  The patient is a 75 year old woman who presented for evaluation and management of hand pain and multiple mucous cysts with nail deformity. Clinical examination suggested bilateral carpal tunnel syndrome without thenar atrophy.  Electrodiagnostic studies confirmed significant medial neuropathy left greater than right documented by prolonged latencies and prolonged lumbrical interosseous differences.  She had a significant mucous cyst affecting her right long finger with considerable nail dystrophy.  She requested correction of this predicament. After informed consent, she is brought to the operating room at this time anticipating left carpal tunnel release and debridement of her right long finger mucous cyst.  DESCRIPTION OF  PROCEDURE:  Yaiza Palazzola was brought to the operating room and placed in supine position on the operating table.  Following induction of general anesthesia by LMA, the right and left arms were prepped with Betadine soap and solution and sterilely draped.  Following exsanguination of the limb with an Esmarch bandage, the arterial tourniquet on the proximal forearm was inflated to 230 mHg.  The procedure commenced with a short incision in line of the ring finger in the palm.  The subcutaneous tissues were carefully divided revealing the palmar fascia.  This was split longitudinally in the line of its fibers to reveal the common sensory branch of the median nerve.  The superficial palmar arch was identified followed by identification of the median nerve proper.  The transverse carpal ligament was released on its ulnar border with scissors extending into the distal forearm.  This widely opened the carpal canal. Bleeding points were electrocauterized with bipolar current followed by repair of the skin with intradermal 3-0 Prolene.  Compressive dressings were applied with a volar plaster splint.  Attention was then directed to the right long finger.  The finger was prepped with Betadine soap and solution and sterilely draped.  The finger was then exsanguinated with a gauze wrap and a half-inch Penrose drain placed as a digital tourniquet.  The procedure commenced with a curvilinear incision exposing the cyst.  The subcutaneous tissues were carefully divided taking care to protect the nail matrix.  The cyst was circumferentially excised followed back to the joint capsule.  Arthrotomy was performed on the radial and ulnar aspects of the central terminal extensor slip followed by debridement of synovium and osteophytes from the deep surface of the extensor insertion.  The joint was then irrigated  with high pressure saline followed by repair of the skin with interrupted sutures of 5-0  nylon.  A compressive dressing was applied with a Xeroflo, sterile gauze and Coban.  For aftercare the patient was given prescriptions for Percocet 5 mg 1 or 2 tablets p.o. q.4-6h. p.r.n. pain.  Also Keflex 500 mg 1 p.o. q.8h. times four days as a prophylactic antibiotic.  She will return to our office in follow-up in seven days for dressing change and advancement to an exercise program. Dictated by:   Katy Fitch. Naaman Plummer., M.D. Attending Physician:  Susa Day DD:  08/29/01 TD:  08/29/01 Job: [id number]

## 2011-02-12 LAB — URINE MICROSCOPIC-ADD ON

## 2011-02-12 LAB — URINALYSIS, ROUTINE W REFLEX MICROSCOPIC
Bilirubin Urine: NEGATIVE
Glucose, UA: NEGATIVE
Ketones, ur: NEGATIVE
Nitrite: NEGATIVE
Protein, ur: NEGATIVE
Specific Gravity, Urine: 1.02
Urobilinogen, UA: 0.2
pH: 5

## 2011-04-06 ENCOUNTER — Ambulatory Visit: Payer: Medicare Other | Admitting: Cardiology

## 2011-05-13 ENCOUNTER — Encounter: Payer: Self-pay | Admitting: *Deleted

## 2011-05-15 ENCOUNTER — Ambulatory Visit (INDEPENDENT_AMBULATORY_CARE_PROVIDER_SITE_OTHER): Payer: Medicare Other | Admitting: Family Medicine

## 2011-05-15 ENCOUNTER — Encounter: Payer: Self-pay | Admitting: Family Medicine

## 2011-05-15 VITALS — BP 128/74 | HR 98 | Temp 98.5°F | Ht 61.5 in | Wt 187.0 lb

## 2011-05-15 DIAGNOSIS — B349 Viral infection, unspecified: Secondary | ICD-10-CM

## 2011-05-15 DIAGNOSIS — B9789 Other viral agents as the cause of diseases classified elsewhere: Secondary | ICD-10-CM

## 2011-05-15 MED ORDER — BENZONATATE 200 MG PO CAPS: 200.0000 mg | ORAL_CAPSULE | Freq: Three times a day (TID) | ORAL | Status: DC | PRN

## 2011-05-15 NOTE — Patient Instructions (Signed)
Viral Syndrome You or your child has Viral Syndrome. It is the most common infection causing "colds" and infections in the nose, throat, sinuses, and breathing tubes. Sometimes the infection causes nausea, vomiting, or diarrhea. The germ that causes the infection is a virus. No antibiotic or other medicine will kill it. There are medicines that you can take to make you or your child more comfortable.  HOME CARE INSTRUCTIONS   Rest in bed until you start to feel better.   If you have diarrhea or vomiting, eat small amounts of crackers and toast. Soup is helpful.   Do not give aspirin or medicine that contains aspirin to children.   Only take over-the-counter or prescription medicines for pain, discomfort, or fever as directed by your caregiver.  SEEK IMMEDIATE MEDICAL CARE IF:   You or your child has not improved within one week.   You or your child has pain that is not at least partially relieved by over-the-counter medicine.   Thick, colored mucus or blood is coughed up.   Discharge from the nose becomes thick yellow or green.   Diarrhea or vomiting gets worse.   There is any major change in your or your child's condition.   You or your child develops a skin rash, stiff neck, severe headache, or are unable to hold down food or fluid.   You or your child has an oral temperature above 102 F (38.9 C), not controlled by medicine.   Your baby is older than 3 months with a rectal temperature of 102 F (38.9 C) or higher.   Your baby is 3 months old or younger with a rectal temperature of 100.4 F (38 C) or higher.  Document Released: 04/11/2006 Document Revised: 01/06/2011 Document Reviewed: 04/12/2007 Exit[employer] Patient Information 2012 [employer]. 

## 2011-05-15 NOTE — Progress Notes (Signed)
  Subjective:    Patient ID: Kathryn Romero, female    DOB: 01-24-1936, 76 y.o.   MRN: [id number]  HPI  Acute visit. Saturday clinic. Patient presents with onset last night of cough, nasal congestion, bodyaches and intermittent headache. No fever confirmed. Took Aleve for headache without much improvement. Has also taken Alka-Seltzer plus and Mucinex. Using saline nasal irrigation. She denies any nausea, vomiting, or diarrhea. Husband was somewhat similar symptoms with onset a few days ago. Generally healthy. Has retinitis pigmentosa   Review of Systems As per history of present illness    Objective:   Physical Exam  Constitutional: She appears well-developed and well-nourished.  HENT:  Mouth/Throat: Oropharynx is clear and moist.       Cerumen bilateral canals  Neck: Neck supple.  Cardiovascular: Normal rate and regular rhythm.   Pulmonary/Chest: Effort normal and breath sounds normal. No respiratory distress. She has no wheezes. She has no rales.  Lymphadenopathy:    She has no cervical adenopathy.  Neurological: She is alert.  Skin: No rash noted.          Assessment & Plan:  Probable viral syndrome. She did not receive influenza vaccine but doubt influenza with no confirmed fever. Symptomatic measures.  Tessalon Perles 200 mg every 8 hours as needed for cough

## 2011-05-17 ENCOUNTER — Ambulatory Visit: Payer: Medicare Other | Admitting: Cardiology

## 2011-05-18 ENCOUNTER — Ambulatory Visit (INDEPENDENT_AMBULATORY_CARE_PROVIDER_SITE_OTHER): Payer: Medicare Other | Admitting: Internal Medicine

## 2011-05-18 ENCOUNTER — Encounter: Payer: Self-pay | Admitting: Internal Medicine

## 2011-05-18 ENCOUNTER — Ambulatory Visit (INDEPENDENT_AMBULATORY_CARE_PROVIDER_SITE_OTHER)
Admission: RE | Admit: 2011-05-18 | Discharge: 2011-05-18 | Disposition: A | Discharge status: Home / Self Care | Payer: Medicare Other | Source: Ambulatory Visit | Attending: Internal Medicine | Admitting: Internal Medicine

## 2011-05-18 VITALS — BP 110/62 | HR 70 | Temp 98.0°F | Wt 186.1 lb

## 2011-05-18 DIAGNOSIS — J209 Acute bronchitis, unspecified: Secondary | ICD-10-CM

## 2011-05-18 DIAGNOSIS — J069 Acute upper respiratory infection, unspecified: Secondary | ICD-10-CM

## 2011-05-18 MED ORDER — HYDROCODONE-HOMATROPINE 5-1.5 MG/5ML PO SYRP: 5.0000 mL | ORAL_SOLUTION | Freq: Four times a day (QID) | ORAL | Status: DC | PRN

## 2011-05-18 NOTE — Progress Notes (Signed)
  Subjective:    HPI  complains of cold symptoms  Onset >1 week ago, progressive symptoms  Initially associated with rhinorrhea, sneezing, sore throat, mild headache and low grade fever now myalgias, sinus pressure and mild-mod chest congestion >yellow green sputum No relief with OTC meds or tessalon - seen 2 days ago at Sat clinic for same Precipitated by sick contacts  Past Medical History  Diagnosis Date  . GLUCOSE INTOLERANCE   . RETINITIS PIGMENTOSA   . COLON CANCER, HX OF   . ANEMIA-NOS   . ANXIETY   . HYPERLIPIDEMIA   . HYPERTENSION   . SYNDROME, CARPAL TUNNEL     Review of Systems Constitutional: No night sweats, no unexpected weight change Pulmonary: No pleurisy or hemoptysis Cardiovascular: No chest pain or palpitations     Objective:   Physical Exam BP 110/62  Pulse 70  Temp(Src) 98 F (36.7 C) (Oral)  Wt 186 lb 1.9 oz (84.423 kg)  SpO2 96% GEN: mildly ill appearing and audible head/chest congestion HENT: NCAT, no sinus tenderness bilaterally, nares with clear discharge, oropharynx mild erythema, no exudate Eyes: Vision grossly intact, no conjunctivitis Lungs: Bilateral scattered rhonchi - but no wheeze or crackle, no increased work of breathing Cardiovascular: Regular rate and rhythm, no bilateral edema  Lab Results  Component Value Date   WBC 3.9* 08/21/2010   HGB 12.6 08/21/2010   HCT 36.2 08/21/2010   PLT 188.0 08/21/2010   GLUCOSE 91 08/21/2010   CHOL 308* 08/21/2010   TRIG 152.0* 08/21/2010   HDL 84.20 08/21/2010   LDLDIRECT 192.7 08/21/2010   ALT 35 08/21/2010   AST 22 08/21/2010   NA 142 08/21/2010   K 4.0 08/21/2010   CL 106 08/21/2010   CREATININE 0.6 08/21/2010   BUN 20 08/21/2010   CO2 29 08/21/2010   TSH 1.23 08/21/2010   HGBA1C 5.9 08/21/2010      Assessment & Plan:  Viral URI > progressive cough/dypnea Acute bronchitis Cough, postnasal drip related to above   check CXR Consider empiric antibiotics prescribed due to symptom duration  greater than 7 days - (review CXR 1st) Prescription cough suppression - new prescriptions done Continue symptomatic care with Tylenol or Advil, hydration and rest -  salt gargle advised as needed

## 2011-05-18 NOTE — Patient Instructions (Signed)
It was good to see you today. If you develop worsening symptoms or fever, call and we can reconsider antibiotics, but it does not appear necessary to use antibiotics at this time. Test(s) ordered today. Your results will be called to you after review (48-72hours after test completion). If any changes need to be made, you will be notified at that time. Hydromet cough syrup - Your prescription(s) have been submitted to your pharmacy. Please take as directed and contact our office if you believe you are having problem(s) with the medication(s). Alternate between ibuprofen and tylenol for aches, pain and fever symptoms as discussed Hydrate, rest and call if worse or unimproved - We're due for your annual cholesterol check in April. Please schedule a followup appointment at that time, call sooner if other problems

## 2011-05-19 ENCOUNTER — Telehealth: Payer: Self-pay

## 2011-05-19 NOTE — Telephone Encounter (Signed)
Pt informed and will continue with Tessalon.

## 2011-05-19 NOTE — Telephone Encounter (Signed)
Pt called stating that cough syrup caused SOB and wheezing. Pt states this is the same reaction she has to codeine. Pt is requesting alternative medication.  Allergy list updated.

## 2011-05-19 NOTE — Telephone Encounter (Signed)
There is no other alternative prescription options. Patient already prescribed Tessalon and can resume same as needed - also can use over-the-counter Robitussin or Mucinex.

## 2011-06-14 ENCOUNTER — Ambulatory Visit (INDEPENDENT_AMBULATORY_CARE_PROVIDER_SITE_OTHER): Payer: Medicare Other | Admitting: Internal Medicine

## 2011-06-14 ENCOUNTER — Other Ambulatory Visit (INDEPENDENT_AMBULATORY_CARE_PROVIDER_SITE_OTHER): Payer: Medicare Other

## 2011-06-14 ENCOUNTER — Encounter: Payer: Self-pay | Admitting: Internal Medicine

## 2011-06-14 VITALS — BP 132/70 | HR 74 | Temp 97.8°F | Wt 184.8 lb

## 2011-06-14 DIAGNOSIS — R5383 Other fatigue: Secondary | ICD-10-CM

## 2011-06-14 DIAGNOSIS — R252 Cramp and spasm: Secondary | ICD-10-CM

## 2011-06-14 DIAGNOSIS — Z13820 Encounter for screening for osteoporosis: Secondary | ICD-10-CM

## 2011-06-14 DIAGNOSIS — R5381 Other malaise: Secondary | ICD-10-CM

## 2011-06-14 LAB — HEPATIC FUNCTION PANEL
ALT: 25 U/L (ref 0–35)
AST: 25 U/L (ref 0–37)
Albumin: 3.8 g/dL (ref 3.5–5.2)
Alkaline Phosphatase: 51 U/L (ref 39–117)
Bilirubin, Direct: 0.1 mg/dL (ref 0.0–0.3)
Total Bilirubin: 0.5 mg/dL (ref 0.3–1.2)
Total Protein: 7.3 g/dL (ref 6.0–8.3)

## 2011-06-14 LAB — BASIC METABOLIC PANEL
BUN: 21 mg/dL (ref 6–23)
CO2: 30 mEq/L (ref 19–32)
Calcium: 9.4 mg/dL (ref 8.4–10.5)
Chloride: 105 mEq/L (ref 96–112)
Creatinine, Ser: 0.6 mg/dL (ref 0.4–1.2)
GFR: 100.18 mL/min (ref >60.00)
Glucose, Bld: 95 mg/dL (ref 70–99)
Potassium: 3.9 mEq/L (ref 3.5–5.1)
Sodium: 141 mEq/L (ref 135–145)

## 2011-06-14 LAB — TSH: TSH: 1.1 u[IU]/mL (ref 0.35–5.50)

## 2011-06-14 LAB — CBC WITH DIFFERENTIAL/PLATELET
Basophils Absolute: 0 10*3/uL (ref 0.0–0.1)
Basophils Relative: 0.6 % (ref 0.0–3.0)
Eosinophils Absolute: 0.2 10*3/uL (ref 0.0–0.7)
Eosinophils Relative: 3.9 % (ref 0.0–5.0)
HCT: 36 % (ref 36.0–46.0)
Hemoglobin: 12.3 g/dL (ref 12.0–15.0)
Lymphocytes Relative: 30.6 % (ref 12.0–46.0)
Lymphs Abs: 1.2 10*3/uL (ref 0.7–4.0)
MCHC: 34.1 g/dL (ref 30.0–36.0)
MCV: 88.3 fl (ref 78.0–100.0)
Monocytes Absolute: 0.3 10*3/uL (ref 0.1–1.0)
Monocytes Relative: 7.2 % (ref 3.0–12.0)
Neutro Abs: 2.3 10*3/uL (ref 1.4–7.7)
Neutrophils Relative %: 57.7 % (ref 43.0–77.0)
Platelets: 171 10*3/uL (ref 150.0–400.0)
RBC: 4.07 Mil/uL (ref 3.87–5.11)
RDW: 13.6 % (ref 11.5–14.6)
WBC: 4 10*3/uL — ABNORMAL LOW (ref 4.5–10.5)

## 2011-06-14 LAB — MAGNESIUM: Magnesium: 2.1 mg/dL (ref 1.5–2.5)

## 2011-06-14 NOTE — Patient Instructions (Signed)
It was good to see you today. Test(s) ordered today. Your results will be called to you after review (48-72hours after test completion). If any changes need to be made, you will be notified at that time. we'll make referral for bone density. Our office will contact you regarding appointment(s) once made.

## 2011-06-14 NOTE — Progress Notes (Signed)
  Subjective:    Patient ID: Kathryn Romero, female    DOB: 09-30-1935, 76 y.o.   MRN: [id number]  HPI  complains of fatigue Onset 6 weeks ago -  Mild leg cramps - worried about mag level  Past Medical History  Diagnosis Date  . GLUCOSE INTOLERANCE   . RETINITIS PIGMENTOSA   . COLON CANCER, HX OF   . ANEMIA-NOS   . ANXIETY   . HYPERLIPIDEMIA   . HYPERTENSION   . SYNDROME, CARPAL TUNNEL     Review of Systems  Constitutional: Positive for fatigue. Negative for fever and unexpected weight change.  Respiratory: Negative for cough and shortness of breath.   Cardiovascular: Negative for chest pain and leg swelling.  Gastrointestinal: Negative for nausea, vomiting, abdominal pain and diarrhea.  Psychiatric/Behavioral: Negative for dysphoric mood and decreased concentration.       Objective:   Physical Exam BP 132/70  Pulse 74  Temp(Src) 97.8 F (36.6 C) (Oral)  Wt 184 lb 12.8 oz (83.825 kg)  SpO2 94% Wt Readings from Last 3 Encounters:  06/14/11 184 lb 12.8 oz (83.825 kg)  05/18/11 186 lb 1.9 oz (84.423 kg)  05/15/11 187 lb (84.823 kg)   Constitutional: She appears well-developed and well-nourished. No distress.  Neck: Normal range of motion. Neck supple. No JVD present. No thyromegaly present.  Cardiovascular: Normal rate, regular rhythm and normal heart sounds.  No murmur heard. No BLE edema. Pulmonary/Chest: Effort normal and breath sounds normal. No respiratory distress. She has no wheezes.  Skin: Skin is warm and dry. No rash noted. No erythema.  Psychiatric: She has a normal mood and affect. Her behavior is normal. Judgment and thought content normal.   Lab Results  Component Value Date   WBC 3.9* 08/21/2010   HGB 12.6 08/21/2010   HCT 36.2 08/21/2010   PLT 188.0 08/21/2010   GLUCOSE 91 08/21/2010   CHOL 308* 08/21/2010   TRIG 152.0* 08/21/2010   HDL 84.20 08/21/2010   LDLDIRECT 192.7 08/21/2010   ALT 35 08/21/2010   AST 22 08/21/2010   NA 142 08/21/2010   K 4.0  08/21/2010   CL 106 08/21/2010   CREATININE 0.6 08/21/2010   BUN 20 08/21/2010   CO2 29 08/21/2010   TSH 1.23 08/21/2010   HGBA1C 5.9 08/21/2010        Assessment & Plan:  Fatigue - nonspecific exam and history - multiple stressors (spouse with progressive dementia) - check screening labs, support offered  Leg cramps - check lytes and vit D

## 2011-06-15 LAB — VITAMIN D 25 HYDROXY (VIT D DEFICIENCY, FRACTURES): Vit D, 25-Hydroxy: 40 ng/mL (ref 30–89)

## 2011-06-29 ENCOUNTER — Telehealth: Payer: Self-pay | Admitting: *Deleted

## 2011-06-29 ENCOUNTER — Ambulatory Visit (INDEPENDENT_AMBULATORY_CARE_PROVIDER_SITE_OTHER)
Admission: RE | Admit: 2011-06-29 | Discharge: 2011-06-29 | Disposition: A | Discharge status: Home / Self Care | Payer: Medicare Other | Source: Ambulatory Visit

## 2011-06-29 ENCOUNTER — Other Ambulatory Visit: Payer: Medicare Other

## 2011-06-29 DIAGNOSIS — Z13820 Encounter for screening for osteoporosis: Secondary | ICD-10-CM

## 2011-06-29 NOTE — Telephone Encounter (Signed)
MD order vitamin d back on 06/14/11. Dx code that was provided is not a working code. [facility] needing new code for vitamin d.... 06/29/11@ 1:16pm/LMB

## 2011-06-29 NOTE — Telephone Encounter (Signed)
Try 733.90, osteopenia -if this does not work, let patient know the vitamin D is not covered and cancel lab as needed

## 2011-06-30 NOTE — Telephone Encounter (Signed)
Notified Vickie with md response.. 06/29/1326:40am/LMB

## 2011-07-06 ENCOUNTER — Encounter: Payer: Self-pay | Admitting: Internal Medicine

## 2012-04-10 ENCOUNTER — Emergency Department (HOSPITAL_COMMUNITY)
Admission: EM | Admit: 2012-04-10 | Discharge: 2012-04-10 | Disposition: A | Discharge status: Home / Self Care | Payer: Medicare Other | Source: Home / Self Care | Attending: Emergency Medicine | Admitting: Emergency Medicine

## 2012-04-10 ENCOUNTER — Encounter (HOSPITAL_COMMUNITY): Payer: Self-pay | Admitting: Emergency Medicine

## 2012-04-10 DIAGNOSIS — S0191XA Laceration without foreign body of unspecified part of head, initial encounter: Secondary | ICD-10-CM

## 2012-04-10 DIAGNOSIS — R52 Pain, unspecified: Secondary | ICD-10-CM

## 2012-04-10 DIAGNOSIS — S0190XA Unspecified open wound of unspecified part of head, initial encounter: Secondary | ICD-10-CM

## 2012-04-10 NOTE — ED Notes (Signed)
Pt c/o laceration to forehead since 10:00... A ceramic sculpture fell on her head ... Laceration is about 2cm ... Denies: loss of conscious but having slight headache... She is alert and responsive w/no signs of distress.

## 2012-04-10 NOTE — ED Provider Notes (Signed)
History     CSN: [id number]  Arrival date & time 04/10/12  1258   First MD Initiated Contact with Patient 04/10/12 1505      Chief Complaint  Patient presents with  . Head Laceration    (Consider location/radiation/quality/duration/timing/severity/associated sxs/prior treatment) Patient is a 76 y.o. female presenting with scalp laceration. The history is provided by the patient.  Head Laceration This is a new problem. The current episode started less than 1 hour ago. The problem has not changed since onset.Nothing aggravates the symptoms. Nothing relieves the symptoms. She has tried nothing for the symptoms. The treatment provided no relief.  Pt reports she was cleaning window and bumped nearby shelf.  Ceramic sculpture hit on her head, laceration to forehead.  -loc  Past Medical History  Diagnosis Date  . GLUCOSE INTOLERANCE   . RETINITIS PIGMENTOSA   . COLON CANCER, HX OF   . ANEMIA-NOS   . ANXIETY   . HYPERLIPIDEMIA   . HYPERTENSION   . SYNDROME, CARPAL TUNNEL     Past Surgical History  Procedure Date  . Appendectomy   . S/p right hemicolectomy 03/2003  . Carapl tunnel release     Left  . Abdominal hysterectomy   . Cataract surgery   . Oophorectomy   . S/p ganglion cyst     Family History  Problem Relation Age of Onset  . ALS Cousin   . Polymyalgia rheumatica Cousin     History  Substance Use Topics  . Smoking status: Never Smoker   . Smokeless tobacco: Never Used     Comment: Married  . Alcohol Use: No    OB History    Grav Para Term Preterm Abortions TAB SAB Ect Mult Living                  Review of Systems  Skin: Positive for wound.  All other systems reviewed and are negative.    Allergies  Hydromet; Codeine; Morphine; and Sulfa antibiotics  Home Medications   Current Outpatient Rx  Name  Route  Sig  Dispense  Refill  . GABAPENTIN 100 MG PO CAPS   Oral   Take 1 capsule (100 mg total) by mouth daily.   30 capsule   2      There were no vitals taken for this visit.  Physical Exam  Nursing note and vitals reviewed. Constitutional: She is oriented to person, place, and time. Vital signs are normal. She appears well-developed and well-nourished. She is active and cooperative.  HENT:  Head: Normocephalic.  Eyes: Conjunctivae normal are normal. Pupils are equal, round, and reactive to light. No scleral icterus.  Neck: Trachea normal and normal range of motion. Neck supple.  Cardiovascular: Normal rate, regular rhythm, normal heart sounds and intact distal pulses.   Pulmonary/Chest: Effort normal and breath sounds normal.  Lymphadenopathy:    She has no cervical adenopathy.  Neurological: She is alert and oriented to person, place, and time. No cranial nerve deficit or sensory deficit.  Skin: Skin is warm and dry.     Psychiatric: She has a normal mood and affect. Her speech is normal and behavior is normal. Judgment and thought content normal. Cognition and memory are normal.    ED Course  LACERATION REPAIR Date/Time: 04/10/2012 3:09 PM Performed by: Johnsie Kindred Authorized by: Leslee Home C Consent: Verbal consent obtained. Risks and benefits: risks, benefits and alternatives were discussed Consent given by: patient and parent Patient understanding: patient states understanding of  the procedure being performed Patient consent: the patient's understanding of the procedure matches consent given Procedure consent: procedure consent matches procedure scheduled Required items: required blood products, implants, devices, and special equipment available Patient identity confirmed: verbally with patient and arm band Time out: Immediately prior to procedure a "time out" was called to verify the correct patient, procedure, equipment, support staff and site/side marked as required. Body area: head/neck Location details: forehead Laceration length: 2 cm Tendon involvement: none Nerve involvement:  none Vascular damage: no Anesthesia method: none. Patient sedated: no Preparation: Patient was prepped and draped in the usual sterile fashion. Irrigation solution: saline Amount of cleaning: standard Wound skin closure material used: dermabond. Approximation difficulty: simple Patient tolerance: Patient tolerated the procedure well with no immediate complications.   (including critical care time)  Labs Reviewed - No data to display No results found.   1. Laceration of head   2. Pain       MDM  Pt reports she will check with her PCP in regards to tetanus, believes it is UTD.  Follow up with PCP as needed.  No swimming or immersion in water until laceration is healed.         Johnsie Kindred, NP 04/10/12 1519

## 2012-04-11 NOTE — ED Provider Notes (Signed)
Medical screening examination/treatment/procedure(s) were performed by non-physician practitioner and as supervising physician I was immediately available for consultation/collaboration.  Leslee Home, M.D.   Reuben Likes, MD 04/11/12 [phone]

## 2012-04-20 LAB — CBC AND DIFFERENTIAL
HCT: 36 % (ref 36–46)
Hemoglobin: 12.8 g/dL (ref 12.0–16.0)
Platelets: 185 10*3/uL (ref 150–399)
WBC: 4.2 10^3/mL

## 2012-04-20 LAB — BASIC METABOLIC PANEL
BUN: 24 mg/dL — AB (ref 4–21)
Creatinine: 0.6 mg/dL (ref 0.5–1.1)
Glucose: 93 mg/dL
Potassium: 4.2 mmol/L (ref 3.4–5.3)
Sodium: 142 mmol/L (ref 137–147)

## 2012-04-20 LAB — LIPID PANEL
Cholesterol: 299 mg/dL — AB (ref 0–200)
HDL: 74 mg/dL — AB (ref 35–70)
LDL Cholesterol: 175 mg/dL
Triglycerides: 143 mg/dL (ref 40–160)

## 2012-04-20 LAB — HEPATIC FUNCTION PANEL
ALT: 22 U/L (ref 7–35)
AST: 22 U/L (ref 13–35)
Alkaline Phosphatase: 42 U/L (ref 25–125)
Bilirubin, Total: 6.7 mg/dL

## 2012-04-20 LAB — TSH: TSH: 1.19 u[IU]/mL (ref 0.41–5.90)

## 2012-04-20 LAB — HEMOGLOBIN A1C: Hgb A1c MFr Bld: 5.7 % (ref 4.0–6.0)

## 2012-05-29 ENCOUNTER — Ambulatory Visit (INDEPENDENT_AMBULATORY_CARE_PROVIDER_SITE_OTHER)
Admission: RE | Admit: 2012-05-29 | Discharge: 2012-05-29 | Disposition: A | Discharge status: Home / Self Care | Payer: Medicare Other | Source: Ambulatory Visit | Attending: Internal Medicine | Admitting: Internal Medicine

## 2012-05-29 ENCOUNTER — Encounter: Payer: Self-pay | Admitting: Internal Medicine

## 2012-05-29 ENCOUNTER — Ambulatory Visit (INDEPENDENT_AMBULATORY_CARE_PROVIDER_SITE_OTHER): Payer: Medicare Other | Admitting: Internal Medicine

## 2012-05-29 VITALS — BP 142/80 | HR 68 | Temp 97.0°F | Resp 16 | Wt 186.0 lb

## 2012-05-29 DIAGNOSIS — M25551 Pain in right hip: Secondary | ICD-10-CM

## 2012-05-29 DIAGNOSIS — M25559 Pain in unspecified hip: Secondary | ICD-10-CM

## 2012-05-29 MED ORDER — NAPROXEN SODIUM 220 MG PO TABS: 220.0000 mg | ORAL_TABLET | Freq: Two times a day (BID) | ORAL | Status: DC

## 2012-05-29 MED ORDER — VITAMIN D3 125 MCG (5000 UT) PO CAPS: 5000.0000 [IU] | ORAL_CAPSULE | Freq: Every day | ORAL | Status: DC

## 2012-05-29 MED ORDER — GLUCOSAMINE-CHONDROITIN 500-400 MG PO TABS: 1.0000 | ORAL_TABLET | Freq: Three times a day (TID) | ORAL | Status: DC

## 2012-05-29 MED ORDER — ERGOCALCIFEROL 1.25 MG (50000 UT) PO CAPS: 50000.0000 [IU] | ORAL_CAPSULE | ORAL | Status: DC

## 2012-05-29 NOTE — Progress Notes (Signed)
Subjective:    Patient ID: Kathryn Romero, female    DOB: 05/17/1935, 77 y.o.   MRN: [id number]  Hip Pain  The incident occurred more than 1 week ago. There was no injury mechanism. The pain is present in the right hip and right thigh. The quality of the pain is described as aching. The pain is moderate. The pain has been intermittent since onset. Pertinent negatives include no inability to bear weight, loss of motion, loss of sensation, muscle weakness, numbness or tingling. The symptoms are aggravated by palpation and movement. She has tried NSAIDs and rest for the symptoms. The treatment provided mild relief.   Past Medical History  Diagnosis Date  . GLUCOSE INTOLERANCE   . RETINITIS PIGMENTOSA   . COLON CANCER, HX OF   . ANEMIA-NOS   . ANXIETY   . HYPERLIPIDEMIA   . HYPERTENSION   . SYNDROME, CARPAL TUNNEL     Review of Systems  Constitutional: Negative for fever and fatigue.  Respiratory: Negative for cough and shortness of breath.   Cardiovascular: Negative for chest pain and palpitations.  Musculoskeletal: Negative for back pain and joint swelling.  Neurological: Negative for tingling, weakness and numbness.       Objective:   Physical Exam BP 142/80  Pulse 68  Temp 97 F (36.1 C) (Oral)  Resp 16  Wt 186 lb (84.369 kg)  SpO2 93% Wt Readings from Last 3 Encounters:  05/29/12 186 lb (84.369 kg)  06/14/11 184 lb 12.8 oz (83.825 kg)  05/18/11 186 lb 1.9 oz (84.423 kg)   Constitutional: She appears well-developed and well-nourished. No distress.   Neck: Normal range of motion. Neck supple. No JVD present. No thyromegaly present.  Cardiovascular: Normal rate, regular rhythm and normal heart sounds.  No murmur heard. No BLE edema. Pulmonary/Chest: Effort normal and breath sounds normal. No respiratory distress. She has no wheezes.  Musculoskeletal: R hip with normal range of motion, pain with active internal>external rotation. Mild pain over groin palpation. No pain  at greater troch or ischial bursa. Back: full range of motion of thoracic and lumbar spine. Non tender to palpation. Negative straight leg raise. DTR's are symmetrically intact. Sensation intact in all dermatomes of the lower extremities. Full strength to manual muscle testing. patient is able to heel toe walk without difficulty and ambulates with antalgic gait. Neurological: She is alert and oriented to person, place, and time. No cranial nerve deficit. Coordination normal.  Skin: Skin is warm and dry. No rash noted. No erythema.  Psychiatric: She has a normal mood and affect. Her behavior is normal. Judgment and thought content normal.   Lab Results  Component Value Date   WBC 4.2 04/20/2012   HGB 12.8 04/20/2012   HCT 36 04/20/2012   PLT 185 04/20/2012   GLUCOSE 95 06/14/2011   CHOL 299* 04/20/2012   TRIG 143 04/20/2012   HDL 74* 04/20/2012   LDLDIRECT 192.7 08/21/2010   LDLCALC 175 04/20/2012   ALT 22 04/20/2012   AST 22 04/20/2012   NA 142 04/20/2012   K 4.2 04/20/2012   CL 105 06/14/2011   CREATININE 0.6 04/20/2012   BUN 24* 04/20/2012   CO2 30 06/14/2011   TSH 1.19 04/20/2012   HGBA1C 5.7 04/20/2012        Assessment & Plan:   R hip pain >47months - tender in groin and pain with internal rotation -  occ radiation into RLE but suspect DJD as cause continue OTC NSAIDs and supplements (glucosamine/chon,  joint health supp and Vit D)  Check DG R hip Refer for PT -requests [facility] at [facility] pt Consider ortho for further eval if needed for adv DJD or back eval for symptomatic DDD  Time spent with pt/family today 25 minutes, greater than 50% time spent counseling patient on DDx of R hip pain and medication review. Also review of prior records including labs from other provider ([place] 04/2012 - entered and scanned into EMR)

## 2012-05-29 NOTE — Patient Instructions (Signed)
It was good to see you today. Test(s) ordered today. Your results will be released to MyChart (or called to you) after review, usually within 72hours after test completion. If any changes need to be made, you will be notified at that same time. Medications reviewed and updated, no changes at this time. we'll make referral to [facility] for physical therapy . Our office will contact you regarding appointment(s) once made.

## 2012-06-13 ENCOUNTER — Telehealth: Payer: Self-pay | Admitting: *Deleted

## 2012-06-13 DIAGNOSIS — M5431 Sciatica, right side: Secondary | ICD-10-CM

## 2012-06-13 NOTE — Telephone Encounter (Signed)
Pt states when she saw md couple weeks ago she done a referral for PT. Started PT for Hip & they told her the numbness she having in her feet & leg is not coming from her hip. Pt would like a referral to see or orthopedic md.../lmb

## 2012-06-14 NOTE — Telephone Encounter (Signed)
Refer for spine specialist/orthopedist - [facility] will call re: same

## 2012-06-14 NOTE — Telephone Encounter (Signed)
Pt notified with md response...Kathryn Romero

## 2012-07-12 ENCOUNTER — Other Ambulatory Visit: Payer: Self-pay | Admitting: Orthopaedic Surgery

## 2012-07-12 DIAGNOSIS — M5136 Other intervertebral disc degeneration, lumbar region: Secondary | ICD-10-CM

## 2012-07-12 DIAGNOSIS — M549 Dorsalgia, unspecified: Secondary | ICD-10-CM

## 2012-07-14 ENCOUNTER — Other Ambulatory Visit: Payer: Medicare Other

## 2012-07-15 ENCOUNTER — Other Ambulatory Visit: Payer: Medicare Other

## 2012-07-20 ENCOUNTER — Ambulatory Visit
Admission: RE | Admit: 2012-07-20 | Discharge: 2012-07-20 | Disposition: A | Discharge status: Home / Self Care | Payer: Medicare Other | Source: Ambulatory Visit | Attending: Orthopaedic Surgery | Admitting: Orthopaedic Surgery

## 2012-07-20 DIAGNOSIS — M5136 Other intervertebral disc degeneration, lumbar region: Secondary | ICD-10-CM

## 2012-07-20 DIAGNOSIS — M549 Dorsalgia, unspecified: Secondary | ICD-10-CM

## 2012-08-15 ENCOUNTER — Telehealth: Payer: Self-pay | Admitting: Internal Medicine

## 2012-08-15 NOTE — Telephone Encounter (Signed)
Patient Information:  Caller Name: Russie  Phone: [phone]  Patient: Kathryn Romero, Kathryn Romero  Gender: Female  DOB: [birth year]  Age: 77 Years  PCP: Rene Paci (Adults only)  Office Follow Up:  Does the office need to follow up with this patient?: No  Instructions For The Office: N/A  RN Note:  States has been "really, really tired" x 2 months.  Wants to know if her blood work shows anything wrong.  Per fatigue protocol, emergent symptoms denied; advised appt within 72 hours.  Appt scheduled 0915 08/16/12 with Dr. Felicity Coyer; patient states she may have to call front desk to change appt time if her neighbor cannot sit with her husband for appt.  krs/can  Symptoms  Reason For Call & Symptoms: Patient calling about fatigue.  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  States has noted decreased energy.  States "I'm just exhausted."  Reviewed Health History In EMR: Yes  Reviewed Medications In EMR: Yes  Reviewed Allergies In EMR: Yes  Reviewed Surgeries / Procedures: Yes  Date of Onset of Symptoms: 06/14/2012  Guideline(s) Used:  Weakness (Generalized) and Fatigue  Disposition Per Guideline:   See Within 3 Days in Office  Reason For Disposition Reached:   Mild weakness (i.e., does not interfere with ability to work, go to school, normal activities) and persists > 1 week  Advice Given:  N/A  Patient Will Follow Care Advice:  YES  Appointment Scheduled:  08/16/2012 09:15:00 Appointment Scheduled Provider:  Rene Paci (Adults only)

## 2012-08-16 ENCOUNTER — Ambulatory Visit (INDEPENDENT_AMBULATORY_CARE_PROVIDER_SITE_OTHER): Payer: Medicare Other | Admitting: Internal Medicine

## 2012-08-16 ENCOUNTER — Other Ambulatory Visit (INDEPENDENT_AMBULATORY_CARE_PROVIDER_SITE_OTHER): Payer: Medicare Other

## 2012-08-16 ENCOUNTER — Encounter: Payer: Self-pay | Admitting: Internal Medicine

## 2012-08-16 VITALS — BP 118/78 | HR 68 | Temp 98.3°F | Wt 188.1 lb

## 2012-08-16 DIAGNOSIS — E739 Lactose intolerance, unspecified: Secondary | ICD-10-CM

## 2012-08-16 DIAGNOSIS — I10 Essential (primary) hypertension: Secondary | ICD-10-CM

## 2012-08-16 DIAGNOSIS — R5381 Other malaise: Secondary | ICD-10-CM

## 2012-08-16 DIAGNOSIS — E785 Hyperlipidemia, unspecified: Secondary | ICD-10-CM

## 2012-08-16 DIAGNOSIS — R5383 Other fatigue: Secondary | ICD-10-CM

## 2012-08-16 LAB — LDL CHOLESTEROL, DIRECT: Direct LDL: 198.1 mg/dL

## 2012-08-16 LAB — CBC WITH DIFFERENTIAL/PLATELET
Basophils Absolute: 0 10*3/uL (ref 0.0–0.1)
Basophils Relative: 1 % (ref 0.0–3.0)
Eosinophils Absolute: 0.1 10*3/uL (ref 0.0–0.7)
Eosinophils Relative: 2.8 % (ref 0.0–5.0)
HCT: 38 % (ref 36.0–46.0)
Hemoglobin: 13.1 g/dL (ref 12.0–15.0)
Lymphocytes Relative: 32.7 % (ref 12.0–46.0)
Lymphs Abs: 1.2 10*3/uL (ref 0.7–4.0)
MCHC: 34.5 g/dL (ref 30.0–36.0)
MCV: 87.8 fl (ref 78.0–100.0)
Monocytes Absolute: 0.2 10*3/uL (ref 0.1–1.0)
Monocytes Relative: 6.2 % (ref 3.0–12.0)
Neutro Abs: 2.2 10*3/uL (ref 1.4–7.7)
Neutrophils Relative %: 57.3 % (ref 43.0–77.0)
Platelets: 204 10*3/uL (ref 150.0–400.0)
RBC: 4.33 Mil/uL (ref 3.87–5.11)
RDW: 13.4 % (ref 11.5–14.6)
WBC: 3.8 10*3/uL — ABNORMAL LOW (ref 4.5–10.5)

## 2012-08-16 LAB — BASIC METABOLIC PANEL
BUN: 17 mg/dL (ref 6–23)
CO2: 29 mEq/L (ref 19–32)
Calcium: 9.1 mg/dL (ref 8.4–10.5)
Chloride: 102 mEq/L (ref 96–112)
Creatinine, Ser: 0.7 mg/dL (ref 0.4–1.2)
GFR: 89.76 mL/min (ref >60.00)
Glucose, Bld: 100 mg/dL — ABNORMAL HIGH (ref 70–99)
Potassium: 3.5 mEq/L (ref 3.5–5.1)
Sodium: 140 mEq/L (ref 135–145)

## 2012-08-16 LAB — HEPATIC FUNCTION PANEL
ALT: 38 U/L — ABNORMAL HIGH (ref 0–35)
AST: 33 U/L (ref 0–37)
Albumin: 3.8 g/dL (ref 3.5–5.2)
Alkaline Phosphatase: 41 U/L (ref 39–117)
Bilirubin, Direct: 0.1 mg/dL (ref 0.0–0.3)
Total Bilirubin: 0.6 mg/dL (ref 0.3–1.2)
Total Protein: 7 g/dL (ref 6.0–8.3)

## 2012-08-16 LAB — LIPID PANEL
Cholesterol: 322 mg/dL — ABNORMAL HIGH (ref 0–200)
HDL: 80.1 mg/dL (ref >39.00)
Total CHOL/HDL Ratio: 4
Triglycerides: 206 mg/dL — ABNORMAL HIGH (ref 0.0–149.0)
VLDL: 41.2 mg/dL — ABNORMAL HIGH (ref 0.0–40.0)

## 2012-08-16 LAB — VITAMIN B12: Vitamin B-12: 455 pg/mL (ref 211–911)

## 2012-08-16 LAB — TSH: TSH: 1.18 u[IU]/mL (ref 0.35–5.50)

## 2012-08-16 LAB — HEMOGLOBIN A1C: Hgb A1c MFr Bld: 5.9 % (ref 4.6–6.5)

## 2012-08-16 NOTE — Assessment & Plan Note (Signed)
Weight gain reviewed - check labs including a1c now to look for DM Advised patient to work on diet and exercise changes for weight loss

## 2012-08-16 NOTE — Assessment & Plan Note (Signed)
Has previously declined statin or rx med for same - Recheck now with weight gain at pt request

## 2012-08-16 NOTE — Assessment & Plan Note (Signed)
BP Readings from Last 3 Encounters:  08/16/12 118/78  05/29/12 142/80  06/14/11 132/70

## 2012-08-16 NOTE — Progress Notes (Signed)
  Subjective:    Patient ID: Kathryn Romero, female    DOB: September 03, 1935, 77 y.o.   MRN: [id number]  HPI  Complains of overwhelming fatigue Symptoms ongoing x2 months Complicated by care of spouse who has dementia and metastatic prostate cancer -clinically declining, home hospice services involved poor sleep due to spouse's need to empty foley bag and nocturnal wandering  Past Medical History  Diagnosis Date  . GLUCOSE INTOLERANCE   . RETINITIS PIGMENTOSA   . COLON CANCER, HX OF   . ANEMIA-NOS   . ANXIETY   . HYPERLIPIDEMIA   . HYPERTENSION   . SYNDROME, CARPAL TUNNEL     Review of Systems  Constitutional: Positive for fatigue. Negative for fever and unexpected weight change.  Respiratory: Negative for cough and shortness of breath.   Cardiovascular: Negative for chest pain and leg swelling.  Psychiatric/Behavioral: Positive for sleep disturbance and decreased concentration. Negative for suicidal ideas, hallucinations, self-injury and dysphoric mood.       Objective:   Physical Exam BP 118/78  Pulse 68  Temp(Src) 98.3 F (36.8 C) (Oral)  Wt 188 lb 1.9 oz (85.331 kg)  BMI 34.97 kg/m2  SpO2 95% Wt Readings from Last 3 Encounters:  08/16/12 188 lb 1.9 oz (85.331 kg)  05/29/12 186 lb (84.369 kg)  06/14/11 184 lb 12.8 oz (83.825 kg)   Constitutional: She appears well-developed and well-nourished. No distress.   Neck: Normal range of motion. Neck supple. No JVD present. No thyromegaly present.  Cardiovascular: Normal rate, regular rhythm and normal heart sounds.  No murmur heard. No BLE edema. Pulmonary/Chest: Effort normal and breath sounds normal. No respiratory distress. She has no wheezes. Psychiatric: She has a normal mood and affect. Her behavior is normal. Judgment and thought content normal.   Lab Results  Component Value Date   WBC 4.2 04/20/2012   HGB 12.8 04/20/2012   HCT 36 04/20/2012   PLT 185 04/20/2012   GLUCOSE 95 06/14/2011   CHOL 299* 04/20/2012   TRIG 143 04/20/2012   HDL 74* 04/20/2012   LDLDIRECT 192.7 08/21/2010   LDLCALC 175 04/20/2012   ALT 22 04/20/2012   AST 22 04/20/2012   NA 142 04/20/2012   K 4.2 04/20/2012   CL 105 06/14/2011   CREATININE 0.6 04/20/2012   BUN 24* 04/20/2012   CO2 30 06/14/2011   TSH 1.19 04/20/2012   HGBA1C 5.7 04/20/2012       Assessment & Plan:   Fatigue - nonspecific symptoms/exam; suspect emotional component related to decline of spouse - check screening labs  See problem list. Medications and labs reviewed today.

## 2012-08-16 NOTE — Patient Instructions (Signed)
It was good to see you today. We have reviewed your prior records including labs and tests today Test(s) ordered today. Your results will be released to MyChart (or called to you) after review, usually within 72hours after test completion. If any changes need to be made, you will be notified at that same time. Medications reviewed and updated, no changes at this time. Please schedule followup in 6 months, call sooner if problems.

## 2012-08-17 ENCOUNTER — Telehealth: Payer: Self-pay | Admitting: *Deleted

## 2012-08-17 LAB — VITAMIN D 25 HYDROXY (VIT D DEFICIENCY, FRACTURES): Vit D, 25-Hydroxy: 52 ng/mL (ref 30–89)

## 2012-08-17 NOTE — Telephone Encounter (Signed)
Called pt concerning labs. Pt is not wanting to start the mevacor for her cholesterol. Wanting to know beside diet/exercise what else can she do. Inform pt md will be out of office tomorrow. Will call back Monday when she return with her response. Also wanting Vitamin D levels as well.Marland KitchenRaechel Chute

## 2012-08-21 ENCOUNTER — Telehealth: Payer: Self-pay | Admitting: Cardiology

## 2012-08-21 NOTE — Telephone Encounter (Signed)
Follow up    Pt need for nurse to give her a call. Wouldn't explain why and stated she didn't want to keep explaining she had explain to someone eariler. Please call pt

## 2012-08-21 NOTE — Telephone Encounter (Signed)
Blood pressure today 150/90, hard time breathing at times, some chest discomfort when she bent over and pain back of head and no energy. Discussed with  Dr. Patty Sermons and he recommended patient calling PCP. Advised patient, verbalized understanding.

## 2012-08-21 NOTE — Telephone Encounter (Signed)
Called pt no answer LMOM RTC.../lmb 

## 2012-08-21 NOTE — Telephone Encounter (Signed)
New problem    Pt thinks she needs to be seen today due to pain in neck/chest

## 2012-08-21 NOTE — Telephone Encounter (Signed)
Vitamin D level is normal There is no natural or OTC cholesterol treatment that I can recommend.  Patient should continue to work on exercise, diet with goal of weight reduction to control same. No other treatment changes recommended. Thanks

## 2012-08-21 NOTE — Telephone Encounter (Signed)
Notified pt with md response. Pt is wanting copy of labs mail to her. Mailing to address on file...lmb

## 2012-12-05 ENCOUNTER — Telehealth: Payer: Self-pay | Admitting: Internal Medicine

## 2012-12-05 NOTE — Telephone Encounter (Signed)
Please call pt

## 2012-12-05 NOTE — Telephone Encounter (Signed)
Pt called request a phone call from the nurse.

## 2012-12-06 ENCOUNTER — Telehealth: Payer: Self-pay | Admitting: *Deleted

## 2012-12-06 NOTE — Telephone Encounter (Signed)
A user error has taken place.

## 2012-12-06 NOTE — Telephone Encounter (Signed)
Message sent to PCP.

## 2013-01-01 ENCOUNTER — Encounter: Payer: Self-pay | Admitting: Internal Medicine

## 2013-01-01 ENCOUNTER — Ambulatory Visit (INDEPENDENT_AMBULATORY_CARE_PROVIDER_SITE_OTHER): Payer: Medicare Other | Admitting: Internal Medicine

## 2013-01-01 ENCOUNTER — Telehealth: Payer: Self-pay | Admitting: *Deleted

## 2013-01-01 VITALS — BP 144/80 | HR 67 | Temp 97.5°F | Wt 182.8 lb

## 2013-01-01 DIAGNOSIS — F411 Generalized anxiety disorder: Secondary | ICD-10-CM

## 2013-01-01 DIAGNOSIS — B029 Zoster without complications: Secondary | ICD-10-CM

## 2013-01-01 DIAGNOSIS — E785 Hyperlipidemia, unspecified: Secondary | ICD-10-CM

## 2013-01-01 DIAGNOSIS — I10 Essential (primary) hypertension: Secondary | ICD-10-CM

## 2013-01-01 MED ORDER — VALACYCLOVIR HCL 1 G PO TABS: 1000.0000 mg | ORAL_TABLET | Freq: Three times a day (TID) | ORAL | Status: DC

## 2013-01-01 MED ORDER — PREGABALIN 25 MG PO CAPS: 25.0000 mg | ORAL_CAPSULE | Freq: Three times a day (TID) | ORAL | Status: DC

## 2013-01-01 NOTE — Telephone Encounter (Signed)
Pt called states she believes her shingles have returned.  Please advise

## 2013-01-01 NOTE — Progress Notes (Signed)
  Subjective:    Patient ID: Kathryn Romero, female    DOB: Jun 11, 1935, 77 y.o.   MRN: [id number]  HPI  Complains of rash - ?recurrent shingles on back Reviewed interval events including passing of spouse summer 2014 with hospice   Past Medical History  Diagnosis Date  . GLUCOSE INTOLERANCE   . RETINITIS PIGMENTOSA   . COLON CANCER, HX OF   . ANEMIA-NOS   . ANXIETY   . HYPERLIPIDEMIA   . HYPERTENSION   . SYNDROME, CARPAL TUNNEL     Review of Systems  Constitutional: Positive for fatigue. Negative for fever and unexpected weight change.  Respiratory: Negative for cough and shortness of breath.   Cardiovascular: Negative for chest pain and leg swelling.  Skin: Positive for rash.  Psychiatric/Behavioral: Negative for hallucinations, self-injury and dysphoric mood. The patient is not nervous/anxious.        Objective:   Physical Exam BP 144/80  Pulse 67  Temp(Src) 97.5 F (36.4 C) (Oral)  Wt 182 lb 12.8 oz (82.918 kg)  BMI 33.98 kg/m2  SpO2 94% Wt Readings from Last 3 Encounters:  01/01/13 182 lb 12.8 oz (82.918 kg)  08/16/12 188 lb 1.9 oz (85.331 kg)  05/29/12 186 lb (84.369 kg)   Constitutional: She is overweight, but appears well-developed and well-nourished. No distress.   Neck: Normal range of motion. Neck supple. No JVD present. No thyromegaly present.  Cardiovascular: Normal rate, regular rhythm and normal heart sounds.  No murmur heard. No BLE edema. Pulmonary/Chest: Effort normal and breath sounds normal. No respiratory distress. She has no wheezes. Skin: small rosy patch with clustered vesicles on R flank Psychiatric: She has a normal mood and affect. Her behavior is normal. Judgment and thought content normal.   Lab Results  Component Value Date   WBC 3.8* 08/16/2012   HGB 13.1 08/16/2012   HCT 38.0 08/16/2012   PLT 204.0 08/16/2012   GLUCOSE 100* 08/16/2012   CHOL 322* 08/16/2012   TRIG 206.0* 08/16/2012   HDL 80.10 08/16/2012   LDLDIRECT 198.1 08/16/2012   LDLCALC 175 04/20/2012   ALT 38* 08/16/2012   AST 33 08/16/2012   NA 140 08/16/2012   K 3.5 08/16/2012   CL 102 08/16/2012   CREATININE 0.7 08/16/2012   BUN 17 08/16/2012   CO2 29 08/16/2012   TSH 1.18 08/16/2012   HGBA1C 5.9 08/16/2012       Assessment & Plan:   Shingles - Classic presentation. Begin Valtrex, ok for Lyrica as needed for pain. Education on diagnosis provided and reassurance  Also see problem list. Medications and labs reviewed today.

## 2013-01-01 NOTE — Patient Instructions (Addendum)
It was good to see you today. We have reviewed your prior records including labs and tests today Valtrex 1 pill 3x/day for 1 week - and Lyrica if needed for pain Your prescription(s) have been submitted to your pharmacy. Please take as directed and contact our office if you believe you are having problem(s) with the medication(s).  Shingles Shingles (herpes zoster) is an infection that is caused by the same virus that causes chickenpox (varicella). The infection causes a painful skin rash and fluid-filled blisters, which eventually break open, crust over, and heal. It may occur in any area of the body, but it usually affects only one side of the body or face. The pain of shingles usually lasts about 1 month. However, some people with shingles may develop long-term (chronic) pain in the affected area of the body. Shingles often occurs many years after the person had chickenpox. It is more common:  In people older than 50 years.  In people with weakened immune systems, such as those with HIV, AIDS, or cancer.  In people taking medicines that weaken the immune system, such as transplant medicines.  In people under great stress. CAUSES  Shingles is caused by the varicella zoster virus (VZV), which also causes chickenpox. After a person is infected with the virus, it can remain in the person's body for years in an inactive state (dormant). To cause shingles, the virus reactivates and breaks out as an infection in a nerve root. The virus can be spread from person to person (contagious) through contact with open blisters of the shingles rash. It will only spread to people who have not had chickenpox. When these people are exposed to the virus, they may develop chickenpox. They will not develop shingles. Once the blisters scab over, the person is no longer contagious and cannot spread the virus to others. SYMPTOMS  Shingles shows up in stages. The initial symptoms may be pain, itching, and tingling in an  area of the skin. This pain is usually described as burning, stabbing, or throbbing.In a few days or weeks, a painful red rash will appear in the area where the pain, itching, and tingling were felt. The rash is usually on one side of the body in a band or belt-like pattern. Then, the rash usually turns into fluid-filled blisters. They will scab over and dry up in approximately 2 3 weeks. Flu-like symptoms may also occur with the initial symptoms, the rash, or the blisters. These may include:  Fever.  Chills.  Headache.  Upset stomach. DIAGNOSIS  Your caregiver will perform a skin exam to diagnose shingles. Skin scrapings or fluid samples may also be taken from the blisters. This sample will be examined under a microscope or sent to a lab for further testing. TREATMENT  There is no specific cure for shingles. Your caregiver will likely prescribe medicines to help you manage the pain, recover faster, and avoid long-term problems. This may include antiviral drugs, anti-inflammatory drugs, and pain medicines. HOME CARE INSTRUCTIONS   Take a cool bath or apply cool compresses to the area of the rash or blisters as directed. This may help with the pain and itching.   Only take over-the-counter or prescription medicines as directed by your caregiver.   Rest as directed by your caregiver.  Keep your rash and blisters clean with mild soap and cool water or as directed by your caregiver.  Do not pick your blisters or scratch your rash. Apply an anti-itch cream or numbing creams to the  affected area as directed by your caregiver.  Keep your shingles rash covered with a loose bandage (dressing).  Avoid skin contact with:  Babies.   Pregnant women.   Children with eczema.   Elderly people with transplants.   People with chronic illnesses, such as leukemia or AIDS.   Wear loose-fitting clothing to help ease the pain of material rubbing against the rash.  Keep all follow-up  appointments with your caregiver.If the area involved is on your face, you may receive a referral for follow-up to a specialist, such as an eye doctor (ophthalmologist) or an ear, nose, and throat (ENT) doctor. Keeping all follow-up appointments will help you avoid eye complications, chronic pain, or disability.  SEEK IMMEDIATE MEDICAL CARE IF:   You have facial pain, pain around the eye area, or loss of feeling on one side of your face.  You have ear pain or ringing in your ear.  You have loss of taste.  Your pain is not relieved with prescribed medicines.   Your redness or swelling spreads.   You have more pain and swelling.  Your condition is worsening or has changed.   You have a feveror persistent symptoms for more than 2 3 days.  You have a fever and your symptoms suddenly get worse. MAKE SURE YOU:  Understand these instructions.  Will watch your condition.  Will get help right away if you are not doing well or get worse. Document Released: 04/26/2005 Document Revised: 01/19/2012 Document Reviewed: 12/09/2011 [facility] Patient Information 2014 [place], [place].

## 2013-01-01 NOTE — Assessment & Plan Note (Signed)
Symptoms, mild exacerbation following death of spouse Jul 20, 2014Support offered, interval history reviewed No prescription changes recommended at this time

## 2013-01-01 NOTE — Assessment & Plan Note (Signed)
Has previously declined statin or rx med for same - Reviewed control of same with weight gain  Check annually, review need for treatment as indicated 

## 2013-01-01 NOTE — Assessment & Plan Note (Signed)
BP Readings from Last 3 Encounters:  01/01/13 144/80  08/16/12 118/78  05/29/12 142/80  patient declines prescription medication for same Exacerbated at this time because of pain related to shingles neuropathy Patient agrees to monitor and will call if systolic pressure remains greater than 140

## 2013-01-01 NOTE — Telephone Encounter (Signed)
OV advised - noted she is on my schedule for 4p today thanks

## 2013-02-13 ENCOUNTER — Ambulatory Visit: Payer: Medicare Other | Admitting: Internal Medicine

## 2013-08-29 ENCOUNTER — Encounter: Payer: Self-pay | Admitting: Internal Medicine

## 2013-09-12 ENCOUNTER — Encounter: Payer: Self-pay | Admitting: Internal Medicine

## 2013-10-30 ENCOUNTER — Ambulatory Visit (AMBULATORY_SURGERY_CENTER): Payer: Managed Care, Other (non HMO) | Admitting: *Deleted

## 2013-10-30 VITALS — Ht 61.5 in | Wt 178.6 lb

## 2013-10-30 DIAGNOSIS — Z85038 Personal history of other malignant neoplasm of large intestine: Secondary | ICD-10-CM

## 2013-10-30 MED ORDER — MOVIPREP 100 G PO SOLR: ORAL | Status: DC

## 2013-10-30 NOTE — Progress Notes (Signed)
No allergies to eggs or soy. No problems with anesthesia.  Pt given Emmi instructions for colonoscopy  No oxygen use  No diet drug use  

## 2013-11-08 ENCOUNTER — Encounter: Payer: Self-pay | Admitting: Internal Medicine

## 2013-11-13 ENCOUNTER — Encounter: Payer: Medicare Other | Admitting: Internal Medicine

## 2013-11-19 LAB — HEPATIC FUNCTION PANEL
ALT: 24 U/L (ref 7–35)
AST: 28 U/L (ref 13–35)
Bilirubin, Total: 0.5 mg/dL

## 2013-11-19 LAB — LIPID PANEL
Cholesterol: 282 mg/dL — AB (ref 0–200)
HDL: 79 mg/dL — AB (ref 35–70)
LDL Cholesterol: 183 mg/dL
Triglycerides: 134 mg/dL (ref 40–160)

## 2013-11-19 LAB — CBC AND DIFFERENTIAL
Hemoglobin: 13.5 g/dL (ref 12.0–16.0)
Platelets: 206 10*3/uL (ref 150–399)
WBC: 4.5 10^3/mL

## 2013-11-19 LAB — BASIC METABOLIC PANEL
BUN: 19 mg/dL (ref 4–21)
Creatinine: 0.8 mg/dL (ref 0.5–1.1)
Glucose: 92 mg/dL
Potassium: 4.1 mmol/L (ref 3.4–5.3)
Sodium: 142 mmol/L (ref 137–147)

## 2013-11-19 LAB — HEMOGLOBIN A1C: Hgb A1c MFr Bld: 5.6 % (ref 4.0–6.0)

## 2013-11-19 LAB — TSH: TSH: 3.32 u[IU]/mL (ref 0.41–5.90)

## 2013-11-27 ENCOUNTER — Telehealth: Payer: Self-pay | Admitting: Internal Medicine

## 2013-11-27 NOTE — Telephone Encounter (Signed)
Updated prep instructions reviewed with pt.

## 2013-12-03 ENCOUNTER — Encounter: Payer: Self-pay | Admitting: Internal Medicine

## 2013-12-03 ENCOUNTER — Ambulatory Visit (AMBULATORY_SURGERY_CENTER): Payer: Managed Care, Other (non HMO) | Admitting: Internal Medicine

## 2013-12-03 VITALS — BP 148/70 | HR 63 | Temp 97.1°F | Resp 16 | Ht 61.5 in | Wt 178.0 lb

## 2013-12-03 DIAGNOSIS — Z85038 Personal history of other malignant neoplasm of large intestine: Secondary | ICD-10-CM

## 2013-12-03 MED ORDER — SODIUM CHLORIDE 0.9 % IV SOLN: 500.0000 mL | INTRAVENOUS | Status: DC

## 2013-12-03 NOTE — Progress Notes (Signed)
Procedure ends, to recovery, report given and VSS. 

## 2013-12-03 NOTE — Op Note (Signed)
[facility]  [address] [place], [address]   COLONOSCOPY PROCEDURE REPORT  PATIENT: Kathryn Romero, Kathryn Romero  MR#: [id number] BIRTHDATE: 1935-10-16 , 53  yrs. old GENDER: Female ENDOSCOPIST: Eustace Quail, MD REFERRED NO:[id number] Program Recall PROCEDURE DATE:  12/03/2013 PROCEDURE:   Colonoscopy, surveillance First Screening Colonoscopy - Avg.  risk and is 50 yrs.  old or older - No.  Prior Negative Screening - Now for repeat screening. N/A  History of Adenoma - Now for follow-up colonoscopy & has been > or = to 3 yrs.  N/A  Polyps Removed Today? No.  Recommend repeat exam, <10 yrs? Yes.  High risk (family or personal hx). ASA CLASS:   Class II INDICATIONS:High risk patient with personal history of colon cancer. Right colon cancer status post right hemicolectomy 2004. Multiple followup exams. Last examination July 2010. MEDICATIONS: MAC sedation, administered by CRNA and propofol (Diprivan) 200mg  IV  DESCRIPTION OF PROCEDURE:   After the risks benefits and alternatives of the procedure were thoroughly explained, informed consent was obtained.  A digital rectal exam revealed no abnormalities of the rectum.   The LB [id number]  endoscope was introduced through the anus and advanced to the surgical anastomosis. No adverse events experienced.   The quality of the prep was excellent, using MoviPrep  The instrument was then slowly withdrawn as the colon was fully examined.      COLON FINDINGS: There was evidence of a prior ileocolonic surgical anastomosis.   Moderate diverticulosis was noted The finding was in the left colon.   The colon mucosa was otherwise normal. Retroflexed views revealed no abnormalities. The time to cecum=2 minutes 34 seconds.  Withdrawal time=6 minutes 46 seconds.  The scope was withdrawn and the procedure completed.  COMPLICATIONS: There were no complications.  ENDOSCOPIC IMPRESSION: 1.   There was evidence of a prior  ileocolonic surgical anastomosis 2.   Moderate diverticulosis was noted in the left colon 3.   The colon mucosa was otherwise normal  RECOMMENDATIONS: 1. Follow up colonoscopy in 5 years   eSigned:  Eustace Quail, MD 12/03/2013 2:51 PM   cc: Rowe Clack, MD, Lurline Del, MD, and The Patient

## 2013-12-03 NOTE — Patient Instructions (Signed)
YOU HAD AN ENDOSCOPIC PROCEDURE TODAY AT THE [facility]: Refer to the procedure report that was given to you for any specific questions about what was found during the examination.  If the procedure report does not answer your questions, please call your gastroenterologist to clarify.  If you requested that your care partner not be given the details of your procedure findings, then the procedure report has been included in a sealed envelope for you to review at your convenience later.  YOU SHOULD EXPECT: Some feelings of bloating in the abdomen. Passage of more gas than usual.  Walking can help get rid of the air that was put into your GI tract during the procedure and reduce the bloating. If you had a lower endoscopy (such as a colonoscopy or flexible sigmoidoscopy) you may notice spotting of blood in your stool or on the toilet paper. If you underwent a bowel prep for your procedure, then you may not have a normal bowel movement for a few days.  DIET: Your first meal following the procedure should be a light meal and then it is ok to progress to your normal diet.  A half-sandwich or bowl of soup is an example of a good first meal.  Heavy or fried foods are harder to digest and may make you feel nauseous or bloated.  Likewise meals heavy in dairy and vegetables can cause extra gas to form and this can also increase the bloating.  Drink plenty of fluids but you should avoid alcoholic beverages for 24 hours.  ACTIVITY: Your care partner should take you home directly after the procedure.  You should plan to take it easy, moving slowly for the rest of the day.  You can resume normal activity the day after the procedure however you should NOT DRIVE or use heavy machinery for 24 hours (because of the sedation medicines used during the test).    SYMPTOMS TO REPORT IMMEDIATELY: A gastroenterologist can be reached at any hour.  During normal business hours, 8:30 AM to 5:00 PM Monday through Friday,  call [phone].  After hours and on weekends, please call the GI answering service at [phone] who will take a message and have the physician on call contact you.   Following lower endoscopy (colonoscopy or flexible sigmoidoscopy):  Excessive amounts of blood in the stool  Significant tenderness or worsening of abdominal pains  Swelling of the abdomen that is new, acute  Fever of 100F or higher    FOLLOW UP: If any biopsies were taken you will be contacted by phone or by letter within the next 1-3 weeks.  Call your gastroenterologist if you have not heard about the biopsies in 3 weeks.  Our staff will call the home number listed on your records the next business day following your procedure to check on you and address any questions or concerns that you may have at that time regarding the information given to you following your procedure. This is a courtesy call and so if there is no answer at the home number and we have not heard from you through the emergency physician on call, we will assume that you have returned to your regular daily activities without incident.  SIGNATURES/CONFIDENTIALITY: You and/or your care partner have signed paperwork which will be entered into your electronic medical record.  These signatures attest to the fact that that the information above on your After Visit Summary has been reviewed and is understood.  Full responsibility of the confidentiality   of this discharge information lies with you and/or your care-partner.     

## 2013-12-04 ENCOUNTER — Telehealth: Payer: Self-pay

## 2013-12-04 NOTE — Telephone Encounter (Signed)
  Follow up Call-  Call back number 12/03/2013  Post procedure Call Back phone  # [phone]  Permission to leave phone message No     Patient questions:  Do you have a fever, pain , or abdominal swelling? No. Pain Score  0 *  Have you tolerated food without any problems? Yes.    Have you been able to return to your normal activities? Yes.    Do you have any questions about your discharge instructions: Diet   No. Medications  No. Follow up visit  No.  Do you have questions or concerns about your Care? No.  Actions: * If pain score is 4 or above: No action needed, pain <4.

## 2014-02-07 LAB — HM MAMMOGRAPHY: HM Mammogram: NEGATIVE

## 2014-04-12 ENCOUNTER — Ambulatory Visit (INDEPENDENT_AMBULATORY_CARE_PROVIDER_SITE_OTHER): Payer: Managed Care, Other (non HMO) | Admitting: Family

## 2014-04-12 ENCOUNTER — Encounter: Payer: Self-pay | Admitting: Family

## 2014-04-12 ENCOUNTER — Other Ambulatory Visit (INDEPENDENT_AMBULATORY_CARE_PROVIDER_SITE_OTHER): Payer: Managed Care, Other (non HMO)

## 2014-04-12 VITALS — BP 140/72 | HR 72 | Temp 98.1°F | Resp 18 | Ht 61.5 in | Wt 187.0 lb

## 2014-04-12 DIAGNOSIS — R252 Cramp and spasm: Secondary | ICD-10-CM

## 2014-04-12 DIAGNOSIS — M25569 Pain in unspecified knee: Secondary | ICD-10-CM | POA: Insufficient documentation

## 2014-04-12 DIAGNOSIS — M25561 Pain in right knee: Secondary | ICD-10-CM

## 2014-04-12 NOTE — Progress Notes (Signed)
   Subjective:    Patient ID: Kathryn Romero, female    DOB: 01-28-1936, 78 y.o.   MRN: [id number]  Chief Complaint  Patient presents with  . Leg Pain    right leg pain on and off x2 to 3 weeks, says it feels like she bruised it, sometimes tingling in her foot, scared of blood clots    HPI:  Kathryn Romero is a 78 y.o. female who presents today for an acute visit.  Acute symptoms of right lower leg pain started about 2 weeks ago. Indicates it feels like she bruised it and feels like she has some tingling in her foot. She has concerns for blood clots. Does have a family history of blood clots. Recently received a cortisone injection in her back for spinal stenosis about 2 weeks ago. Descrbes that leg feels tight and she gets cramps in her legs at night.  Allergies  Allergen Reactions  . Codeine Shortness Of Breath  . Hydromet [Hydrocodone-Homatropine] Shortness Of Breath and Other (See Comments)    Wheezing  . Morphine     "feels funny"  . Sulfa Antibiotics     n/v   No current outpatient prescriptions on file prior to visit.   No current facility-administered medications on file prior to visit.   Review of Systems    See HPI  Objective:    BP 140/72 mmHg  Pulse 72  Temp(Src) 98.1 F (36.7 C) (Oral)  Resp 18  Ht 5' 1.5" (1.562 m)  Wt 187 lb (84.823 kg)  BMI 34.77 kg/m2  SpO2 94% Nursing note and vital signs reviewed.  Physical Exam  Constitutional: She is oriented to person, place, and time. She appears well-developed and well-nourished. No distress.  Cardiovascular: Normal rate, regular rhythm, normal heart sounds and intact distal pulses.   Pulmonary/Chest: Effort normal and breath sounds normal.  Musculoskeletal:  No obvious deformity or discoloration of right lower leg noted. Mild tenderness elicited mid fibular shaft the right leg. No palpable masses. Ankle has full range of motion with no pain. Passive dorsi flexion right ankle does not increase pain.   Neurological: She is alert and oriented to person, place, and time.  Skin: Skin is warm and dry.  Psychiatric: She has a normal mood and affect. Her behavior is normal. Judgment and thought content normal.       Assessment & Plan:

## 2014-04-12 NOTE — Patient Instructions (Addendum)
Thank you for choosing [facility].  Summary/Instructions:  Please stop by the lab on the basement level of the building for your blood work. Your results will be released to [facility] (or called to you) after review, usually within 72hours after test completion. If any changes need to be made, you will be notified at that same time.  If your symptoms worsen or fail to improve, please contact our office for further instruction, or in case of emergency go directly to the emergency room at the closest medical facility.   Please drink plenty of fluids. Stretch your lower legs prior to going to bed. We will refer you to cardiology per our discussion.

## 2014-04-12 NOTE — Progress Notes (Signed)
Pre visit review using our clinic review tool, if applicable. No additional management support is needed unless otherwise documented below in the visit note. 

## 2014-04-12 NOTE — Assessment & Plan Note (Signed)
Symptoms and exam consistent with potential muscle spasm. No obvious redness, deformity or discoloration noted. Obtain electrolytes, magnesium, and phosphorus. Refer to cardiology to rule out any claudication and for cardiovascular risk assessment. Instructed to follow up if symptoms worsen or fail to improve. Instructed if leg pain becomes increasing to seek urgent care or emergency care office is closed.

## 2014-04-14 LAB — PHOSPHORUS: Phosphorus: 3.6 mg/dL (ref 2.3–4.6)

## 2014-04-14 LAB — BASIC METABOLIC PANEL
BUN: 21 mg/dL (ref 6–23)
CO2: 27 mEq/L (ref 19–32)
Calcium: 9.3 mg/dL (ref 8.4–10.5)
Chloride: 104 mEq/L (ref 96–112)
Creatinine, Ser: 0.7 mg/dL (ref 0.4–1.2)
GFR: 85.02 mL/min (ref >60.00)
Glucose, Bld: 82 mg/dL (ref 70–99)
Potassium: 4 mEq/L (ref 3.5–5.1)
Sodium: 141 mEq/L (ref 135–145)

## 2014-04-14 LAB — MAGNESIUM: Magnesium: 2.3 mg/dL (ref 1.5–2.5)

## 2014-04-15 ENCOUNTER — Telehealth: Payer: Self-pay | Admitting: Family

## 2014-04-15 NOTE — Telephone Encounter (Signed)
Please call the for the patient her lab work is within normal limits. DOS the spasm is most likely not related to electrolyte deficiency is most likely related to her lower back.

## 2014-04-15 NOTE — Telephone Encounter (Signed)
Called pt and left message for her to call back.  

## 2014-04-15 NOTE — Telephone Encounter (Signed)
Spoke with pt. Let her know that all lab work was normal and her spasms could be related to lower back. Pt understood.

## 2014-05-20 ENCOUNTER — Ambulatory Visit (INDEPENDENT_AMBULATORY_CARE_PROVIDER_SITE_OTHER): Payer: PPO | Admitting: Internal Medicine

## 2014-05-20 ENCOUNTER — Ambulatory Visit (INDEPENDENT_AMBULATORY_CARE_PROVIDER_SITE_OTHER)
Admission: RE | Admit: 2014-05-20 | Discharge: 2014-05-20 | Disposition: A | Discharge status: Home / Self Care | Payer: PPO | Source: Ambulatory Visit | Attending: Internal Medicine | Admitting: Internal Medicine

## 2014-05-20 ENCOUNTER — Other Ambulatory Visit (INDEPENDENT_AMBULATORY_CARE_PROVIDER_SITE_OTHER): Payer: PPO

## 2014-05-20 ENCOUNTER — Encounter: Payer: Self-pay | Admitting: Internal Medicine

## 2014-05-20 VITALS — BP 116/68 | HR 67 | Temp 98.2°F | Ht 61.5 in | Wt 183.0 lb

## 2014-05-20 DIAGNOSIS — M8580 Other specified disorders of bone density and structure, unspecified site: Secondary | ICD-10-CM

## 2014-05-20 DIAGNOSIS — I10 Essential (primary) hypertension: Secondary | ICD-10-CM

## 2014-05-20 DIAGNOSIS — Z0000 Encounter for general adult medical examination without abnormal findings: Secondary | ICD-10-CM

## 2014-05-20 DIAGNOSIS — E669 Obesity, unspecified: Secondary | ICD-10-CM | POA: Insufficient documentation

## 2014-05-20 DIAGNOSIS — Z23 Encounter for immunization: Secondary | ICD-10-CM

## 2014-05-20 DIAGNOSIS — E785 Hyperlipidemia, unspecified: Secondary | ICD-10-CM

## 2014-05-20 DIAGNOSIS — M899 Disorder of bone, unspecified: Secondary | ICD-10-CM

## 2014-05-20 DIAGNOSIS — E739 Lactose intolerance, unspecified: Secondary | ICD-10-CM

## 2014-05-20 LAB — LIPID PANEL
Cholesterol: 261 mg/dL — ABNORMAL HIGH (ref 0–200)
HDL: 71.5 mg/dL (ref >39.00)
LDL Cholesterol: 170 mg/dL — ABNORMAL HIGH (ref 0–99)
NonHDL: 189.5
Total CHOL/HDL Ratio: 4
Triglycerides: 98 mg/dL (ref 0.0–149.0)
VLDL: 19.6 mg/dL (ref 0.0–40.0)

## 2014-05-20 LAB — BASIC METABOLIC PANEL
BUN: 17 mg/dL (ref 6–23)
CO2: 27 mEq/L (ref 19–32)
Calcium: 9.5 mg/dL (ref 8.4–10.5)
Chloride: 106 mEq/L (ref 96–112)
Creatinine, Ser: 0.7 mg/dL (ref 0.4–1.2)
GFR: 83.63 mL/min (ref >60.00)
Glucose, Bld: 109 mg/dL — ABNORMAL HIGH (ref 70–99)
Potassium: 4.2 mEq/L (ref 3.5–5.1)
Sodium: 140 mEq/L (ref 135–145)

## 2014-05-20 LAB — HEMOGLOBIN A1C: Hgb A1c MFr Bld: 5.9 % (ref 4.6–6.5)

## 2014-05-20 NOTE — Progress Notes (Signed)
Pre visit review using our clinic review tool, if applicable. No additional management support is needed unless otherwise documented below in the visit note. 

## 2014-05-20 NOTE — Assessment & Plan Note (Signed)
BP Readings from Last 3 Encounters:  05/20/14 116/68  04/12/14 140/72  12/03/13 148/70  patient declines prescription medication for same Exacerbated periodically because of pain  Patient agrees to monitor and will call if systolic pressure greater than 140

## 2014-05-20 NOTE — Patient Instructions (Addendum)
It was good to see you today.  We have reviewed your prior records including labs and tests today  Health Maintenance reviewed - Prevnar 13 updated today and annual flu shot declined - all other recommended immunizations and age-appropriate screenings are up-to-date.  Will schedule your bone density test as discussed  Test(s) ordered today. Your results will be released to [place] (or called to you) after review, usually within 72hours after test completion. If any changes need to be made, you will be notified at that same time.  Medications reviewed and updated, no changes recommended at this time.  Continue working with your other specialists as reviewed  Please schedule followup in 12 months for annual exam and labs, call sooner if problems.  Health Maintenance Adopting a healthy lifestyle and getting preventive care can go a long way to promote health and wellness. Talk with your health care provider about what schedule of regular examinations is right for you. This is a good chance for you to check in with your provider about disease prevention and staying healthy. In between checkups, there are plenty of things you can do on your own. Experts have done a lot of research about which lifestyle changes and preventive measures are most likely to keep you healthy. Ask your health care provider for more information. WEIGHT AND DIET  Eat a healthy diet  Be sure to include plenty of vegetables, fruits, low-fat dairy products, and lean protein.  Do not eat a lot of foods high in solid fats, added sugars, or salt.  Get regular exercise. This is one of the most important things you can do for your health.  Most adults should exercise for at least 150 minutes each week. The exercise should increase your heart rate and make you sweat (moderate-intensity exercise).  Most adults should also do strengthening exercises at least twice a week. This is in addition to the moderate-intensity exercise.   Maintain a healthy weight  Body mass index (BMI) is a measurement that can be used to identify possible weight problems. It estimates body fat based on height and weight. Your health care provider can help determine your BMI and help you achieve or maintain a healthy weight.  For females 49 years of age and older:   A BMI below 18.5 is considered underweight.  A BMI of 18.5 to 24.9 is normal.  A BMI of 25 to 29.9 is considered overweight.  A BMI of 30 and above is considered obese.  Watch levels of cholesterol and blood lipids  You should start having your blood tested for lipids and cholesterol at 77 years of age, then have this test every 5 years.  You may need to have your cholesterol levels checked more often if:  Your lipid or cholesterol levels are high.  You are older than 77 years of age.  You are at high risk for heart disease.  CANCER SCREENING   Lung Cancer  Lung cancer screening is recommended for adults 25-79 years old who are at high risk for lung cancer because of a history of smoking.  A yearly low-dose CT scan of the lungs is recommended for people who:  Currently smoke.  Have quit within the past 15 years.  Have at least a 30-pack-year history of smoking. A pack year is smoking an average of one pack of cigarettes a day for 1 year.  Yearly screening should continue until it has been 15 years since you quit.  Yearly screening should stop if you  develop a health problem that would prevent you from having lung cancer treatment.  Breast Cancer  Practice breast self-awareness. This means understanding how your breasts normally appear and feel.  It also means doing regular breast self-exams. Let your health care provider know about any changes, no matter how small.  If you are in your 20s or 30s, you should have a clinical breast exam (CBE) by a health care provider every 1-3 years as part of a regular health exam.  If you are 72 or older, have a  CBE every year. Also consider having a breast X-ray (mammogram) every year.  If you have a family history of breast cancer, talk to your health care provider about genetic screening.  If you are at high risk for breast cancer, talk to your health care provider about having an MRI and a mammogram every year.  Breast cancer gene (BRCA) assessment is recommended for women who have family members with BRCA-related cancers. BRCA-related cancers include:  Breast.  Ovarian.  Tubal.  Peritoneal cancers.  Results of the assessment will determine the need for genetic counseling and BRCA1 and BRCA2 testing. Cervical Cancer Routine pelvic examinations to screen for cervical cancer are no longer recommended for nonpregnant women who are considered low risk for cancer of the pelvic organs (ovaries, uterus, and vagina) and who do not have symptoms. A pelvic examination may be necessary if you have symptoms including those associated with pelvic infections. Ask your health care provider if a screening pelvic exam is right for you.   The Pap test is the screening test for cervical cancer for women who are considered at risk.  If you had a hysterectomy for a problem that was not cancer or a condition that could lead to cancer, then you no longer need Pap tests.  If you are older than 65 years, and you have had normal Pap tests for the past 10 years, you no longer need to have Pap tests.  If you have had past treatment for cervical cancer or a condition that could lead to cancer, you need Pap tests and screening for cancer for at least 20 years after your treatment.  If you no longer get a Pap test, assess your risk factors if they change (such as having a new sexual partner). This can affect whether you should start being screened again.  Some women have medical problems that increase their chance of getting cervical cancer. If this is the case for you, your health care provider may recommend more  frequent screening and Pap tests.  The human papillomavirus (HPV) test is another test that may be used for cervical cancer screening. The HPV test looks for the virus that can cause cell changes in the cervix. The cells collected during the Pap test can be tested for HPV.  The HPV test can be used to screen women 47 years of age and older. Getting tested for HPV can extend the interval between normal Pap tests from three to five years.  An HPV test also should be used to screen women of any age who have unclear Pap test results.  After 77 years of age, women should have HPV testing as often as Pap tests.  Colorectal Cancer  This type of cancer can be detected and often prevented.  Routine colorectal cancer screening usually begins at 77 years of age and continues through 77 years of age.  Your health care provider may recommend screening at an earlier age if you have  risk factors for colon cancer.  Your health care provider may also recommend using home test kits to check for hidden blood in the stool.  A small camera at the end of a tube can be used to examine your colon directly (sigmoidoscopy or colonoscopy). This is done to check for the earliest forms of colorectal cancer.  Routine screening usually begins at age 70.  Direct examination of the colon should be repeated every 5-10 years through 77 years of age. However, you may need to be screened more often if early forms of precancerous polyps or small growths are found. Skin Cancer  Check your skin from head to toe regularly.  Tell your health care provider about any new moles or changes in moles, especially if there is a change in a mole's shape or color.  Also tell your health care provider if you have a mole that is larger than the size of a pencil eraser.  Always use sunscreen. Apply sunscreen liberally and repeatedly throughout the day.  Protect yourself by wearing long sleeves, pants, a wide-brimmed hat, and sunglasses  whenever you are outside. HEART DISEASE, DIABETES, AND HIGH BLOOD PRESSURE   Have your blood pressure checked at least every 1-2 years. High blood pressure causes heart disease and increases the risk of stroke.  If you are between 55 years and 51 years old, ask your health care provider if you should take aspirin to prevent strokes.  Have regular diabetes screenings. This involves taking a blood sample to check your fasting blood sugar level.  If you are at a normal weight and have a low risk for diabetes, have this test once every three years after 77 years of age.  If you are overweight and have a high risk for diabetes, consider being tested at a younger age or more often. PREVENTING INFECTION  Hepatitis B  If you have a higher risk for hepatitis B, you should be screened for this virus. You are considered at high risk for hepatitis B if:  You were born in a country where hepatitis B is common. Ask your health care provider which countries are considered high risk.  Your parents were born in a high-risk country, and you have not been immunized against hepatitis B (hepatitis B vaccine).  You have HIV or AIDS.  You use needles to inject street drugs.  You live with someone who has hepatitis B.  You have had sex with someone who has hepatitis B.  You get hemodialysis treatment.  You take certain medicines for conditions, including cancer, organ transplantation, and autoimmune conditions. Hepatitis C  Blood testing is recommended for:  Everyone born from [birth year] through 1963.  Anyone with known risk factors for hepatitis C. Sexually transmitted infections (STIs)  You should be screened for sexually transmitted infections (STIs) including gonorrhea and chlamydia if:  You are sexually active and are younger than 77 years of age.  You are older than 77 years of age and your health care provider tells you that you are at risk for this type of infection.  Your sexual activity  has changed since you were last screened and you are at an increased risk for chlamydia or gonorrhea. Ask your health care provider if you are at risk.  If you do not have HIV, but are at risk, it may be recommended that you take a prescription medicine daily to prevent HIV infection. This is called pre-exposure prophylaxis (PrEP). You are considered at risk if:  You are sexually  active and do not regularly use condoms or know the HIV status of your partner(s).  You take drugs by injection.  You are sexually active with a partner who has HIV. Talk with your health care provider about whether you are at high risk of being infected with HIV. If you choose to begin PrEP, you should first be tested for HIV. You should then be tested every 3 months for as long as you are taking PrEP.  PREGNANCY   If you are premenopausal and you may become pregnant, ask your health care provider about preconception counseling.  If you may become pregnant, take 400 to 800 micrograms (mcg) of folic acid every day.  If you want to prevent pregnancy, talk to your health care provider about birth control (contraception). OSTEOPOROSIS AND MENOPAUSE   Osteoporosis is a disease in which the bones lose minerals and strength with aging. This can result in serious bone fractures. Your risk for osteoporosis can be identified using a bone density scan.  If you are 60 years of age or older, or if you are at risk for osteoporosis and fractures, ask your health care provider if you should be screened.  Ask your health care provider whether you should take a calcium or vitamin D supplement to lower your risk for osteoporosis.  Menopause may have certain physical symptoms and risks.  Hormone replacement therapy may reduce some of these symptoms and risks. Talk to your health care provider about whether hormone replacement therapy is right for you.  HOME CARE INSTRUCTIONS   Schedule regular health, dental, and eye  exams.  Stay current with your immunizations.   Do not use any tobacco products including cigarettes, chewing tobacco, or electronic cigarettes.  If you are pregnant, do not drink alcohol.  If you are breastfeeding, limit how much and how often you drink alcohol.  Limit alcohol intake to no more than 1 drink per day for nonpregnant women. One drink equals 12 ounces of beer, 5 ounces of wine, or 1 ounces of hard liquor.  Do not use street drugs.  Do not share needles.  Ask your health care provider for help if you need support or information about quitting drugs.  Tell your health care provider if you often feel depressed.  Tell your health care provider if you have ever been abused or do not feel safe at home. Document Released: 11/09/2010 Document Revised: 09/10/2013 Document Reviewed: 03/28/2013 [facility] Patient Information 2015 [place], [place]. This information is not intended to replace advice given to you by your health care provider. Make sure you discuss any questions you have with your health care provider.

## 2014-05-20 NOTE — Assessment & Plan Note (Signed)
Has previously declined statin or rx med for same - Reviewed control of same with weight gain  Check annually, review need for treatment as indicated

## 2014-05-20 NOTE — Assessment & Plan Note (Signed)
Weight gain reviewed - check labs including a1c now to look for DM Advised patient to work on diet and exercise changes for weight loss Lab Results  Component Value Date   HGBA1C 5.9 08/16/2012

## 2014-05-20 NOTE — Assessment & Plan Note (Signed)
Wt Readings from Last 3 Encounters:  05/20/14 183 lb (83.008 kg)  04/12/14 187 lb (84.823 kg)  12/03/13 178 lb (80.74 kg)   The patient is asked to make an attempt to improve diet and exercise patterns to aid in medical management of this problem. Following Dr. Luna Glasgow diet since 05/10/14 with good success -down 8# in 1st 2 weeks -encouragement provided

## 2014-05-20 NOTE — Progress Notes (Signed)
Subjective:    Patient ID: Kathryn Romero, female    DOB: 1935-05-15, 79 y.o.   MRN: [id number]  HPI   Here for annual medicare wellness  Diet: heart healthy  Physical activity: sedentary Depression/mood screen: negative Hearing: intact to whispered voice Visual acuity: grossly normal, performs annual eye exam  ADLs: capable Fall risk: none Home safety: good Cognitive evaluation: intact to orientation, naming, recall and repetition EOL planning: adv directives, full code/ I agree  I have personally reviewed and have noted 1. The patient's medical and social history 2. Their use of alcohol, tobacco or illicit drugs 3. Their current medications and supplements 4. The patient's functional ability including ADL's, fall risks, home safety risks and hearing or visual impairment. 5. Diet and physical activities 6. Evidence for depression or mood disorders   Also reviewed chronic medical issues and interval medical events  Past Medical History  Diagnosis Date  . GLUCOSE INTOLERANCE   . RETINITIS PIGMENTOSA   . COLON CANCER, HX OF   . ANEMIA-NOS   . ANXIETY   . HYPERLIPIDEMIA   . HYPERTENSION   . SYNDROME, CARPAL TUNNEL   . Cancer 2005    colon cancer   Family History  Problem Relation Age of Onset  . ALS Cousin   . Polymyalgia rheumatica Cousin   . Colon cancer Neg Hx    History  Substance Use Topics  . Smoking status: Never Smoker   . Smokeless tobacco: Never Used  . Alcohol Use: No    Review of Systems  Constitutional: Negative for fatigue and unexpected weight change.  Respiratory: Negative for cough, shortness of breath and wheezing.   Cardiovascular: Negative for chest pain, palpitations and leg swelling.  Gastrointestinal: Negative for nausea, abdominal pain and diarrhea.  Neurological: Negative for dizziness, weakness, light-headedness and headaches.  Psychiatric/Behavioral: Negative for dysphoric mood. The patient is not nervous/anxious.   All  other systems reviewed and are negative.      Objective:   Physical Exam  BP 116/68 mmHg  Pulse 67  Temp(Src) 98.2 F (36.8 C) (Oral)  Ht 5' 1.5" (1.562 m)  Wt 183 lb (83.008 kg)  BMI 34.02 kg/m2  SpO2 94% Wt Readings from Last 3 Encounters:  05/20/14 183 lb (83.008 kg)  04/12/14 187 lb (84.823 kg)  12/03/13 178 lb (80.74 kg)   Constitutional: She is obese, appears well-developed and well-nourished. No distress.  Neck: Normal range of motion. Neck supple. No JVD present. No thyromegaly present.  Cardiovascular: Normal rate, regular rhythm and normal heart sounds.  No murmur heard. No BLE edema. Pulmonary/Chest: Effort normal and breath sounds normal. No respiratory distress. She has no wheezes.  Psychiatric: She has a normal mood and affect. Her behavior is normal. Judgment and thought content normal.   Lab Results  Component Value Date   WBC 3.8* 08/16/2012   HGB 13.1 08/16/2012   HCT 38.0 08/16/2012   PLT 204.0 08/16/2012   GLUCOSE 82 04/12/2014   CHOL 322* 08/16/2012   TRIG 206.0* 08/16/2012   HDL 80.10 08/16/2012   LDLDIRECT 198.1 08/16/2012   LDLCALC 175 04/20/2012   ALT 38* 08/16/2012   AST 33 08/16/2012   NA 141 04/12/2014   K 4.0 04/12/2014   CL 104 04/12/2014   CREATININE 0.7 04/12/2014   BUN 21 04/12/2014   CO2 27 04/12/2014   TSH 1.18 08/16/2012   HGBA1C 5.9 08/16/2012    Mr Lumbar Spine Wo Contrast  07/20/2012   *RADIOLOGY REPORT*  Clinical  Data: Back pain.  Degenerative disc disease.  Bilateral leg numbness.  MRI LUMBAR SPINE WITHOUT CONTRAST  Technique:  Multiplanar and multiecho pulse sequences of the lumbar spine were obtained without intravenous contrast.  Comparison: MRI lumbar spine 08/27/2010.  Findings: Normal signal is present in the conus medullaris which terminates at L1.  Grade 1 anterolisthesis at L4-5 measures 6 mm. Marrow signal, vertebral body heights, alignment are otherwise normal.  Limited imaging of the abdomen is unremarkable.   T11-12:  A leftward disc herniation is stable.  There is no significant stenosis.  T12-L1:  Negative.  L1-2:  A mild leftward disc herniation is present without significant stenosis.  L2-3:  A broad-based disc herniation is present.  The disc herniates into the inferior recess of both neural foramina.  There is slight progression in mild foraminal stenosis bilaterally.  Mild lateral recess narrowing is worse on the left.  L3-4:  A broad-based disc herniation is present.  Moderate facet hypertrophy is evident.  Moderate central and bilateral foraminal stenosis is worse on the right.  L4-5:  A broad-based disc herniation is present.  Advanced facet hypertrophy is noted.  There is progression of severe central canal stenosis.  Moderate to severe foraminal stenosis is evident bilaterally, worse on the right.  L5-S1:  Mild facet hypertrophy is present.  There is a mild broad- based disc bulge.  No significant stenosis is evident.  IMPRESSION:  1.  Progression of multilevel spondylosis of the cervical spine at L2-3, L3-4, and L4-5. 2.  Mild lateral recess and foraminal stenosis at L2-3 is worse on the left. 3.  Progression of moderate to severe foraminal stenosis at L3-4 is worse on the right. 4.  Progression of moderate lateral recess narrowing bilaterally. 5.  Progression of severe central canal stenosis at L4-5. 6.  Progression of moderate severe foraminal stenosis bilaterally at L4-5 is worse on the right.   Original Report Authenticated By: San Morelle, M.D.       Assessment & Plan:   AWV/z00.00 - Today patient counseled on age appropriate routine health concerns for screening and prevention, each reviewed and up to date or declined. Immunizations reviewed and up to date or declined. Labs reviewed. Risk factors for depression reviewed and negative. Hearing function and visual acuity are intact. ADLs screened and addressed as needed. Functional ability and level of safety reviewed and appropriate.  Education, counseling and referrals performed based on assessed risks today. Patient provided with a copy of personalized plan for preventive services.  Problem List Items Addressed This Visit    Dyslipidemia    Has previously declined statin or rx med for same - Reviewed control of same with weight gain  Check annually, review need for treatment as indicated    Relevant Orders      Lipid panel   Essential hypertension    BP Readings from Last 3 Encounters:  05/20/14 116/68  04/12/14 140/72  12/03/13 148/70  patient declines prescription medication for same Exacerbated periodically because of pain  Patient agrees to monitor and will call if systolic pressure greater than 140    Relevant Orders      Basic metabolic panel   GLUCOSE INTOLERANCE    Weight gain reviewed - check labs including a1c now to look for DM Advised patient to work on diet and exercise changes for weight loss Lab Results  Component Value Date   HGBA1C 5.9 08/16/2012       Relevant Orders      Hemoglobin A1c  Obese    Wt Readings from Last 3 Encounters:  05/20/14 183 lb (83.008 kg)  04/12/14 187 lb (84.823 kg)  12/03/13 178 lb (80.74 kg)   The patient is asked to make an attempt to improve diet and exercise patterns to aid in medical management of this problem. Following Dr. Luna Glasgow diet since 05/10/14 with good success -down 8# in 1st 2 weeks -encouragement provided     Other Visit Diagnoses    Routine general medical examination at a health care facility    -  Primary    Need for prophylactic vaccination against Streptococcus pneumoniae (pneumococcus)        Relevant Orders       Pneumococcal conjugate vaccine 13-valent (Completed)    Osteopenia, senile        Relevant Orders       DG Bone Density

## 2014-05-21 ENCOUNTER — Telehealth: Payer: Self-pay | Admitting: Internal Medicine

## 2014-05-21 NOTE — Telephone Encounter (Signed)
Pt called back in.  She said that she was returning phone call.

## 2014-05-22 NOTE — Telephone Encounter (Signed)
Pt given lab results. Pt is happy to hear that her labs are improving.  Stated that she left her ear rings in xray on Monday.  Called xray, they have them. Will mail them to pt with letter of results.

## 2014-05-22 NOTE — Telephone Encounter (Signed)
Pt would like a call back from nurse with results

## 2014-06-10 ENCOUNTER — Ambulatory Visit: Payer: Managed Care, Other (non HMO) | Admitting: Cardiology

## 2014-06-11 ENCOUNTER — Ambulatory Visit: Payer: Managed Care, Other (non HMO) | Admitting: Cardiology

## 2014-07-19 ENCOUNTER — Telehealth: Payer: Self-pay

## 2014-07-22 NOTE — Telephone Encounter (Signed)
Call to patient for status of flu vaccine. Patient declines

## 2014-09-14 ENCOUNTER — Emergency Department (HOSPITAL_COMMUNITY)
Admission: EM | Admit: 2014-09-14 | Discharge: 2014-09-14 | Disposition: A | Discharge status: Home / Self Care | Payer: PPO | Attending: Emergency Medicine | Admitting: Emergency Medicine

## 2014-09-14 DIAGNOSIS — Z8659 Personal history of other mental and behavioral disorders: Secondary | ICD-10-CM | POA: Diagnosis not present

## 2014-09-14 DIAGNOSIS — R531 Weakness: Secondary | ICD-10-CM | POA: Insufficient documentation

## 2014-09-14 DIAGNOSIS — M546 Pain in thoracic spine: Secondary | ICD-10-CM | POA: Insufficient documentation

## 2014-09-14 DIAGNOSIS — Z862 Personal history of diseases of the blood and blood-forming organs and certain disorders involving the immune mechanism: Secondary | ICD-10-CM | POA: Insufficient documentation

## 2014-09-14 DIAGNOSIS — N3001 Acute cystitis with hematuria: Secondary | ICD-10-CM

## 2014-09-14 DIAGNOSIS — Z8639 Personal history of other endocrine, nutritional and metabolic disease: Secondary | ICD-10-CM | POA: Insufficient documentation

## 2014-09-14 DIAGNOSIS — I10 Essential (primary) hypertension: Secondary | ICD-10-CM | POA: Diagnosis not present

## 2014-09-14 DIAGNOSIS — R0602 Shortness of breath: Secondary | ICD-10-CM | POA: Diagnosis not present

## 2014-09-14 DIAGNOSIS — R5383 Other fatigue: Secondary | ICD-10-CM | POA: Diagnosis present

## 2014-09-14 DIAGNOSIS — R51 Headache: Secondary | ICD-10-CM | POA: Insufficient documentation

## 2014-09-14 DIAGNOSIS — R5381 Other malaise: Secondary | ICD-10-CM | POA: Diagnosis not present

## 2014-09-14 DIAGNOSIS — Z8669 Personal history of other diseases of the nervous system and sense organs: Secondary | ICD-10-CM | POA: Insufficient documentation

## 2014-09-14 DIAGNOSIS — Z85038 Personal history of other malignant neoplasm of large intestine: Secondary | ICD-10-CM | POA: Diagnosis not present

## 2014-09-14 LAB — CBC WITH DIFFERENTIAL/PLATELET
Basophils Absolute: 0 10*3/uL (ref 0.0–0.1)
Basophils Relative: 0 % (ref 0–1)
Eosinophils Absolute: 0.1 10*3/uL (ref 0.0–0.7)
Eosinophils Relative: 1 % (ref 0–5)
HCT: 41.9 % (ref 36.0–46.0)
Hemoglobin: 13.7 g/dL (ref 12.0–15.0)
Lymphocytes Relative: 7 % — ABNORMAL LOW (ref 12–46)
Lymphs Abs: 0.4 10*3/uL — ABNORMAL LOW (ref 0.7–4.0)
MCH: 29 pg (ref 26.0–34.0)
MCHC: 32.7 g/dL (ref 30.0–36.0)
MCV: 88.6 fL (ref 78.0–100.0)
Monocytes Absolute: 0.2 10*3/uL (ref 0.1–1.0)
Monocytes Relative: 3 % (ref 3–12)
Neutro Abs: 5.3 10*3/uL (ref 1.7–7.7)
Neutrophils Relative %: 89 % — ABNORMAL HIGH (ref 43–77)
Platelets: 184 10*3/uL (ref 150–400)
RBC: 4.73 MIL/uL (ref 3.87–5.11)
RDW: 13.2 % (ref 11.5–15.5)
WBC: 5.9 10*3/uL (ref 4.0–10.5)

## 2014-09-14 LAB — URINALYSIS, ROUTINE W REFLEX MICROSCOPIC
Bilirubin Urine: NEGATIVE
Glucose, UA: NEGATIVE mg/dL
Ketones, ur: NEGATIVE mg/dL
Nitrite: POSITIVE — AB
Protein, ur: NEGATIVE mg/dL
Specific Gravity, Urine: 1.022 (ref 1.005–1.030)
Urobilinogen, UA: 0.2 mg/dL (ref 0.0–1.0)
pH: 5 (ref 5.0–8.0)

## 2014-09-14 LAB — D-DIMER, QUANTITATIVE: D-Dimer, Quant: 0.58 ug/mL-FEU — ABNORMAL HIGH (ref 0.00–0.48)

## 2014-09-14 LAB — COMPREHENSIVE METABOLIC PANEL
ALT: 37 U/L (ref 14–54)
AST: 28 U/L (ref 15–41)
Albumin: 3.9 g/dL (ref 3.5–5.0)
Alkaline Phosphatase: 46 U/L (ref 38–126)
Anion gap: 10 (ref 5–15)
BUN: 16 mg/dL (ref 6–20)
CO2: 24 mmol/L (ref 22–32)
Calcium: 9 mg/dL (ref 8.9–10.3)
Chloride: 104 mmol/L (ref 101–111)
Creatinine, Ser: 0.71 mg/dL (ref 0.44–1.00)
GFR calc Af Amer: >60 mL/min (ref >60)
GFR calc non Af Amer: >60 mL/min (ref >60)
Glucose, Bld: 108 mg/dL — ABNORMAL HIGH (ref 70–99)
Potassium: 3.4 mmol/L — ABNORMAL LOW (ref 3.5–5.1)
Sodium: 138 mmol/L (ref 135–145)
Total Bilirubin: 0.7 mg/dL (ref 0.3–1.2)
Total Protein: 6.9 g/dL (ref 6.5–8.1)

## 2014-09-14 LAB — URINE MICROSCOPIC-ADD ON

## 2014-09-14 LAB — TROPONIN I: Troponin I: <0.03 ng/mL (ref <0.031)

## 2014-09-14 MED ORDER — CEPHALEXIN 500 MG PO CAPS: 500.0000 mg | ORAL_CAPSULE | Freq: Three times a day (TID) | ORAL | Status: DC

## 2014-09-14 MED ORDER — ACETAMINOPHEN 325 MG PO TABS
650.0000 mg | ORAL_TABLET | Freq: Once | ORAL | Status: AC
  Administered 2014-09-14: 650 mg via ORAL
  Filled 2014-09-14: qty 2

## 2014-09-14 NOTE — ED Notes (Signed)
Patient woke today at 0400 with C/O weakness and a feeling of impending doom.  Patient C/O generalized weakness and DOE.  Patient C/O back ache in her upper back.

## 2014-09-14 NOTE — ED Notes (Signed)
Pt stable, ambulatory, states understanding of discharge instructions 

## 2014-09-14 NOTE — ED Notes (Signed)
Pulse 92 02 stats 93 while ambulating in the hall way

## 2014-09-14 NOTE — ED Provider Notes (Signed)
CSN: [id number]     Arrival date & time 09/14/14  41 History   First MD Initiated Contact with Patient 09/14/14 1210     Chief Complaint  Patient presents with  . Fatigue     (Consider location/radiation/quality/duration/timing/severity/associated sxs/prior Treatment) Patient is a 79 y.o. female presenting with general illness.  Illness Quality:  Malaise, fatigue, headache, upper back pain, shortness of breath Severity:  Severe Onset quality:  Gradual Duration:  2 days Timing:  Constant Progression:  Worsening Chronicity:  New Context:  Began feeling poorly yesterday, described as malaise/fatigue.  This morning, woke up at about 4am and felt very bad with SOB, upper back pain, and generalized weakness.   Associated symptoms: no abdominal pain, no cough, no fever, no nausea and no vomiting     Past Medical History  Diagnosis Date  . GLUCOSE INTOLERANCE   . RETINITIS PIGMENTOSA   . COLON CANCER, HX OF   . ANEMIA-NOS   . ANXIETY   . HYPERLIPIDEMIA   . HYPERTENSION   . SYNDROME, CARPAL TUNNEL   . Cancer 2005    colon cancer  . Lumbar spinal stenosis     injections by Ramos 03/2014 with improvement in pain sx   Past Surgical History  Procedure Laterality Date  . Appendectomy  1946  . S/p right hemicolectomy  03/2003  . Carapl tunnel release Left 1990  . Abdominal hysterectomy  1989  . Cataract surgery  1995  . Oophorectomy  1989  . S/p ganglion cyst  1973   Family History  Problem Relation Age of Onset  . ALS Cousin   . Polymyalgia rheumatica Cousin   . Colon cancer Neg Hx    History  Substance Use Topics  . Smoking status: Never Smoker   . Smokeless tobacco: Never Used  . Alcohol Use: No   OB History    No data available     Review of Systems  Constitutional: Negative for fever.  Respiratory: Negative for cough.   Gastrointestinal: Negative for nausea, vomiting and abdominal pain.  All other systems reviewed and are negative.     Allergies   Codeine; Hydromet; Morphine; Statins; and Sulfa antibiotics  Home Medications   Prior to Admission medications   Medication Sig Start Date End Date Taking? Authorizing Provider  naproxen sodium (ANAPROX) 220 MG tablet Take 220 mg by mouth every 6 (six) hours as needed (shoulder and back pain).   Yes Historical Provider, MD   BP 135/64 mmHg  Pulse 81  Temp(Src) 98.4 F (36.9 C) (Oral)  Resp 24  SpO2 96% Physical Exam  Constitutional: She is oriented to person, place, and time. She appears well-developed and well-nourished. No distress.  HENT:  Head: Normocephalic and atraumatic.  Mouth/Throat: Oropharynx is clear and moist.  Eyes: Conjunctivae are normal. Pupils are equal, round, and reactive to light. No scleral icterus.  Neck: Neck supple.  Cardiovascular: Normal rate, regular rhythm, normal heart sounds and intact distal pulses.   No murmur heard. Pulmonary/Chest: Effort normal and breath sounds normal. No stridor. No respiratory distress. She has no rales.  Abdominal: Soft. Bowel sounds are normal. She exhibits no distension. There is no tenderness.  Musculoskeletal: Normal range of motion. She exhibits no edema.  Mild TTP across mid thoracic back  Neurological: She is alert and oriented to person, place, and time.  Skin: Skin is warm and dry. No rash noted.  Psychiatric: She has a normal mood and affect. Her behavior is normal.  Nursing note and  vitals reviewed.   ED Course  Procedures (including critical care time) Labs Review Labs Reviewed  CBC WITH DIFFERENTIAL/PLATELET - Abnormal; Notable for the following:    Neutrophils Relative % 89 (*)    Lymphocytes Relative 7 (*)    Lymphs Abs 0.4 (*)    All other components within normal limits  COMPREHENSIVE METABOLIC PANEL - Abnormal; Notable for the following:    Potassium 3.4 (*)    Glucose, Bld 108 (*)    All other components within normal limits  URINALYSIS, ROUTINE W REFLEX MICROSCOPIC - Abnormal; Notable for the  following:    APPearance CLOUDY (*)    Hgb urine dipstick MODERATE (*)    Nitrite POSITIVE (*)    Leukocytes, UA SMALL (*)    All other components within normal limits  D-DIMER, QUANTITATIVE - Abnormal; Notable for the following:    D-Dimer, Quant 0.58 (*)    All other components within normal limits  URINE MICROSCOPIC-ADD ON - Abnormal; Notable for the following:    Squamous Epithelial / LPF FEW (*)    Bacteria, UA MANY (*)    All other components within normal limits  URINE CULTURE  TROPONIN I    Imaging Review No results found.   EKG Interpretation   Date/Time:  Saturday Sep 14 2014 12:17:44 EDT Ventricular Rate:  85 PR Interval:  173 QRS Duration: 97 QT Interval:  375 QTC Calculation: 446 R Axis:   3 Text Interpretation:  Sinus rhythm Low voltage, precordial leads No  significant change was found Confirmed by [facility]  MD, TREY ([id number]) on  09/14/2014 2:45:51 PM      MDM   Final diagnoses:  Acute cystitis with hematuria    79 yo female with vague complaints of malaise, fatigue, generalized weakness.  She also complained of some upper back pain and posterior headache.  Exam was reassuring.  She was able to ambulate without difficulty, increased WOB, or hypoxia.  Bloodwork unremarkable.  DDimer negative by age related cutoff. UA notable for nitrite positive UTI.  I think this is likely the source of her malaise.  She remained well appearing with stable vitals during her ED course.  Plan dc with keflex and PCP follow up.        Serita Grit, MD 09/14/14 [phone]

## 2014-09-14 NOTE — Discharge Instructions (Signed)

## 2014-09-16 LAB — URINE CULTURE: Colony Count: >=100000

## 2014-09-17 ENCOUNTER — Telehealth (HOSPITAL_BASED_OUTPATIENT_CLINIC_OR_DEPARTMENT_OTHER): Payer: Self-pay | Admitting: Emergency Medicine

## 2014-09-17 NOTE — Telephone Encounter (Signed)
Post ED Visit - Positive Culture Follow-up  Culture report reviewed by antimicrobial stewardship pharmacist: []  Wes Plumas, Pharm.D., BCPS [x]  Heide Guile, Pharm.D., BCPS []  Alycia Rossetti, Pharm.D., BCPS []  Rockwell, Pharm.D., BCPS, AAHIVP []  Legrand Como, Pharm.D., BCPS, AAHIVP []  Isac Sarna, Pharm.D., BCPS  Positive urine culture Klebsiella Treated with cephalexin, organism sensitive to the same and no further patient follow-up is required at this time.  Hazle Nordmann 09/17/2014, 9:52 AM

## 2014-09-18 ENCOUNTER — Other Ambulatory Visit (INDEPENDENT_AMBULATORY_CARE_PROVIDER_SITE_OTHER): Payer: PPO

## 2014-09-18 ENCOUNTER — Encounter: Payer: Self-pay | Admitting: Internal Medicine

## 2014-09-18 ENCOUNTER — Ambulatory Visit (INDEPENDENT_AMBULATORY_CARE_PROVIDER_SITE_OTHER): Payer: PPO | Admitting: Internal Medicine

## 2014-09-18 VITALS — BP 120/72 | HR 69 | Temp 98.4°F | Resp 12 | Wt 183.8 lb

## 2014-09-18 DIAGNOSIS — N3000 Acute cystitis without hematuria: Secondary | ICD-10-CM

## 2014-09-18 DIAGNOSIS — R5383 Other fatigue: Secondary | ICD-10-CM

## 2014-09-18 LAB — BASIC METABOLIC PANEL
BUN: 20 mg/dL (ref 6–23)
CO2: 28 mEq/L (ref 19–32)
Calcium: 9.2 mg/dL (ref 8.4–10.5)
Chloride: 106 mEq/L (ref 96–112)
Creatinine, Ser: 0.66 mg/dL (ref 0.40–1.20)
GFR: 92.39 mL/min (ref >60.00)
Glucose, Bld: 99 mg/dL (ref 70–99)
Potassium: 3.6 mEq/L (ref 3.5–5.1)
Sodium: 141 mEq/L (ref 135–145)

## 2014-09-18 LAB — TSH: TSH: 0.99 u[IU]/mL (ref 0.35–4.50)

## 2014-09-18 NOTE — Progress Notes (Signed)
Pre visit review using our clinic review tool, if applicable. No additional management support is needed unless otherwise documented below in the visit note. 

## 2014-09-18 NOTE — Patient Instructions (Signed)
We are going to check your blood work today and call you back with the results.   Continue taking the antibiotics until they are gone. If you are not feeling back to getting stronger in a week call us back.  It is okay to take the vitamin D. It is also okay to use the bone broth if you want to.

## 2014-09-19 DIAGNOSIS — R5383 Other fatigue: Secondary | ICD-10-CM | POA: Insufficient documentation

## 2014-09-19 DIAGNOSIS — R531 Weakness: Secondary | ICD-10-CM | POA: Insufficient documentation

## 2014-09-19 DIAGNOSIS — N390 Urinary tract infection, site not specified: Secondary | ICD-10-CM | POA: Insufficient documentation

## 2014-09-19 NOTE — Progress Notes (Signed)
   Subjective:    Patient ID: Kathryn Romero, female    DOB: [birth year], 79 y.o.   MRN: [id number]  HPI The patient is a 79 YO female who is coming in today for an ER follow up (treated for UTI, presented with weakness, all other labs and imaging negative for acute process). She is taking her antibiotic and is feeling slightly better but not a ton. She denies dysuria (never had) or abdominal pain or flank pain. She denies fevers and chills. She is still feeling somewhat weak and the antibiotic affected her stomach some. Denies any new symptoms. Has been doing a bone broth diet lately and wonders if that is related. She has stopped since the infection.   Review of Systems  Constitutional: Positive for fatigue. Negative for fever, chills, activity change, appetite change and unexpected weight change.  HENT: Negative.   Respiratory: Negative.   Cardiovascular: Negative.   Gastrointestinal: Negative.   Genitourinary: Negative.   Neurological: Negative.   Psychiatric/Behavioral: Negative.       Objective:   Physical Exam  Constitutional: She is oriented to person, place, and time. She appears well-developed and well-nourished.  HENT:  Head: Normocephalic and atraumatic.  Eyes: EOM are normal.  Neck: Normal range of motion.  Cardiovascular: Normal rate and regular rhythm.   Pulmonary/Chest: Effort normal and breath sounds normal. No respiratory distress. She has no wheezes.  Abdominal: Soft. Bowel sounds are normal. She exhibits no distension. There is no tenderness.  Musculoskeletal: She exhibits no edema.  Neurological: She is alert and oriented to person, place, and time. Coordination normal.  Skin: Skin is warm and dry.  Psychiatric: She has a normal mood and affect.   Filed Vitals:   09/18/14 1108  BP: 120/72  Pulse: 69  Temp: 98.4 F (36.9 C)  TempSrc: Oral  Resp: 12  Weight: 183 lb 12.8 oz (83.371 kg)  SpO2: 96%      Assessment & Plan:

## 2014-09-19 NOTE — Assessment & Plan Note (Signed)
Checking on her thyroid and BMP today (since potassium slightly low in the ER) to make sure we are not missing a cause for her fatigue. Possibly related to her infection and would expect it to resolve as her infection clears.

## 2014-09-19 NOTE — Assessment & Plan Note (Signed)
Culture growing and sensitive to keflex that she is taking. She still has about half of the antibiotics left and encouraged her to take all of them. She will do so.

## 2014-09-20 ENCOUNTER — Telehealth: Payer: Self-pay | Admitting: Internal Medicine

## 2014-09-20 NOTE — Telephone Encounter (Signed)
Patient is calling on lab results, and also to advise of her current condition. Please give the patient a call to discuss this.

## 2014-09-20 NOTE — Telephone Encounter (Signed)
Spoke with patient. She has not finished her antibiotics yet. She said she still does not feel well. I asked her to give [place] a call after she finishes all her medication if she is still not feeling better.

## 2014-09-23 ENCOUNTER — Ambulatory Visit: Payer: PPO | Admitting: Internal Medicine

## 2014-09-23 NOTE — Telephone Encounter (Signed)
Spoke with patient and she is scheduling an office visit 

## 2014-09-23 NOTE — Telephone Encounter (Signed)
She is not feeling better. When I call her, do you want me to have her come back in?

## 2014-09-23 NOTE — Telephone Encounter (Signed)
Yes, if no better needs visit.

## 2014-09-23 NOTE — Telephone Encounter (Signed)
Pt called in said that she is not feeling any better and would like to see if nurse could call her back  [phone]  Best number

## 2014-09-25 ENCOUNTER — Other Ambulatory Visit (INDEPENDENT_AMBULATORY_CARE_PROVIDER_SITE_OTHER): Payer: PPO

## 2014-09-25 ENCOUNTER — Encounter: Payer: Self-pay | Admitting: Internal Medicine

## 2014-09-25 ENCOUNTER — Telehealth: Payer: Self-pay | Admitting: Internal Medicine

## 2014-09-25 ENCOUNTER — Ambulatory Visit (INDEPENDENT_AMBULATORY_CARE_PROVIDER_SITE_OTHER): Payer: PPO | Admitting: Internal Medicine

## 2014-09-25 ENCOUNTER — Ambulatory Visit (INDEPENDENT_AMBULATORY_CARE_PROVIDER_SITE_OTHER)
Admission: RE | Admit: 2014-09-25 | Discharge: 2014-09-25 | Disposition: A | Discharge status: Home / Self Care | Payer: PPO | Source: Ambulatory Visit | Attending: Internal Medicine | Admitting: Internal Medicine

## 2014-09-25 VITALS — BP 140/68 | HR 83 | Temp 98.2°F | Ht 61.5 in | Wt 184.0 lb

## 2014-09-25 DIAGNOSIS — J180 Bronchopneumonia, unspecified organism: Secondary | ICD-10-CM

## 2014-09-25 DIAGNOSIS — N3001 Acute cystitis with hematuria: Secondary | ICD-10-CM

## 2014-09-25 LAB — URINALYSIS, ROUTINE W REFLEX MICROSCOPIC
Bilirubin Urine: NEGATIVE
Leukocytes, UA: NEGATIVE
Nitrite: NEGATIVE
Specific Gravity, Urine: >=1.03 — AB (ref 1.000–1.030)
Total Protein, Urine: 30 — AB
Urine Glucose: NEGATIVE
Urobilinogen, UA: 0.2 (ref 0.0–1.0)
pH: 5.5 (ref 5.0–8.0)

## 2014-09-25 MED ORDER — LEVOFLOXACIN 500 MG PO TABS: 500.0000 mg | ORAL_TABLET | Freq: Every day | ORAL | Status: DC

## 2014-09-25 MED ORDER — BENZONATATE 200 MG PO CAPS: 200.0000 mg | ORAL_CAPSULE | Freq: Two times a day (BID) | ORAL | Status: DC | PRN

## 2014-09-25 NOTE — Patient Instructions (Signed)
It was good to see you today.  Test(s) ordered today. Your results will be released to [place] (or called to you) after review, usually within 72hours after test completion. If any changes need to be made, you will be notified at that same time.  Levaquin antibiotics daily x 7 days and prescription cough pills - Your prescription(s) have been submitted to your pharmacy. Please take as directed and contact our office if you believe you are having problem(s) with the medication(s).  Alternate between aleve, ibuprofen and/or tylenol for aches, pain and fever symptoms as discussed  Hydrate, rest and call if worse or unimproved

## 2014-09-25 NOTE — Progress Notes (Signed)
Subjective:    Patient ID: Burgess Estelle, female    DOB: May 31, 1935, 79 y.o.   MRN: [id number]  HPI  Patient here for cough 2 weeks, unimproved with over-the-counter meds and conservative care Also reviewed chronic conditions, interval history including complications of UTI with emergency room visit earlier this month  Past Medical History  Diagnosis Date  . GLUCOSE INTOLERANCE   . RETINITIS PIGMENTOSA   . COLON CANCER, HX OF   . ANEMIA-NOS   . ANXIETY   . HYPERLIPIDEMIA   . HYPERTENSION   . SYNDROME, CARPAL TUNNEL   . Cancer 2005    colon cancer  . Lumbar spinal stenosis     injections by Ramos 03/2014 with improvement in pain sx    Review of Systems  Constitutional: Positive for chills and fatigue. Negative for fever and diaphoresis.  Respiratory: Positive for cough (deep with sputum - thick yellow green). Negative for chest tightness, shortness of breath and wheezing.   Cardiovascular: Negative for chest pain, palpitations and leg swelling.  Psychiatric/Behavioral: Positive for sleep disturbance (due to cough).       Objective:    Physical Exam  Constitutional: She appears well-developed and well-nourished. No distress.  HENT:  Mouth/Throat: Oropharynx is clear and moist. No oropharyngeal exudate.  Cardiovascular: Normal rate, regular rhythm and normal heart sounds.   No murmur heard. Pulmonary/Chest: Effort normal. No respiratory distress. She has no wheezes. She has rales (with bibasalar crackles).  Musculoskeletal: She exhibits no edema.    BP 140/68 mmHg  Pulse 83  Temp(Src) 98.2 F (36.8 C) (Oral)  Ht 5' 1.5" (1.562 m)  Wt 184 lb (83.462 kg)  BMI 34.21 kg/m2  SpO2 94% Wt Readings from Last 3 Encounters:  09/25/14 184 lb (83.462 kg)  09/18/14 183 lb 12.8 oz (83.371 kg)  05/20/14 183 lb (83.008 kg)    Lab Results  Component Value Date   WBC 5.9 09/14/2014   HGB 13.7 09/14/2014   HCT 41.9 09/14/2014   PLT 184 09/14/2014   GLUCOSE 99  09/18/2014   CHOL 261* 05/20/2014   TRIG 98.0 05/20/2014   HDL 71.50 05/20/2014   LDLDIRECT 198.1 08/16/2012   LDLCALC 170* 05/20/2014   ALT 37 09/14/2014   AST 28 09/14/2014   NA 141 09/18/2014   K 3.6 09/18/2014   CL 106 09/18/2014   CREATININE 0.66 09/18/2014   BUN 20 09/18/2014   CO2 28 09/18/2014   TSH 0.99 09/18/2014   HGBA1C 5.9 05/20/2014    No results found.  05/18/2011 CXR: IMPRESSION: No acute cardiopulmonary disease or significant interval change.     Assessment & Plan:   Cough - suspicion for bronchopneumonia Hx hemorraghic cystitis 5/7 ED visit reviewed, s/p Keflex x 7d - never had symptoms so unsure if improved   Check CXR and UA/Ucx tx with Levaquin for pulm and ?uro source rx benzonate (adv reaction to narcotics reviewed)  Problem List Items Addressed This Visit    None    Visit Diagnoses    Bronchopneumonia    -  Primary    Relevant Medications    levofloxacin (LEVAQUIN) 500 MG tablet    benzonatate (TESSALON) 200 MG capsule    Other Relevant Orders    DG Chest 2 View    Acute cystitis with hematuria        Relevant Orders    Urinalysis, Routine w reflex microscopic    Urine culture        Gwendolyn Grant, MD

## 2014-09-25 NOTE — Progress Notes (Signed)
Pre visit review using our clinic review tool, if applicable. No additional management support is needed unless otherwise documented below in the visit note. 

## 2014-09-25 NOTE — Telephone Encounter (Signed)
Pt called request x ray result that was done today.

## 2014-09-26 ENCOUNTER — Ambulatory Visit: Payer: PPO | Admitting: Internal Medicine

## 2014-09-26 NOTE — Telephone Encounter (Signed)
Patient has called back in regards  °

## 2014-09-28 LAB — URINE CULTURE: Colony Count: >=100000

## 2014-09-30 ENCOUNTER — Telehealth: Payer: Self-pay | Admitting: Internal Medicine

## 2014-09-30 NOTE — Telephone Encounter (Signed)
Please call patient regarding her urine culture results

## 2014-10-01 NOTE — Telephone Encounter (Signed)
LVM for pt to call back as soon as possible.   RE: culture results.

## 2014-10-01 NOTE — Telephone Encounter (Signed)
Pt had questions about needing more abx or other rx for uti. Indicated that pt did not.  Pt wanted to know about the urology referral and I informed that they would call her for an appt as soon as the referral coordinators had the insurance side ironed out.

## 2014-10-02 ENCOUNTER — Telehealth: Payer: Self-pay | Admitting: Internal Medicine

## 2014-10-02 NOTE — Telephone Encounter (Signed)
Patient is calling for the result of her culture.

## 2014-10-03 NOTE — Telephone Encounter (Signed)
Called pt.  Pt states that chest is tight - Pt feels very tired and it causes exhaustion to even talk. Pt states also that she is sleeping more and longer amounts and she doesn't feel that she is getting enough oxygen.  Acute visit made with Dr. Linna Darner for tomorrow. (Friday 10/04/14)

## 2014-10-04 ENCOUNTER — Ambulatory Visit: Payer: PPO | Admitting: Internal Medicine

## 2014-10-08 ENCOUNTER — Telehealth: Payer: Self-pay | Admitting: Internal Medicine

## 2014-10-08 NOTE — Telephone Encounter (Signed)
Patient was supposed to go on a trip to [place] and she is unable to go due to her health. She needs a note from her doctor in order to cancel the flight. She was hoping you could send her the note through email so she can forward it to them. Her email is [email or web address]. She is also wondering about being sent to a urologist due to her uti. Please call patient.

## 2014-10-09 ENCOUNTER — Other Ambulatory Visit: Payer: Self-pay | Admitting: Internal Medicine

## 2014-10-09 ENCOUNTER — Other Ambulatory Visit (INDEPENDENT_AMBULATORY_CARE_PROVIDER_SITE_OTHER): Payer: PPO

## 2014-10-09 DIAGNOSIS — N390 Urinary tract infection, site not specified: Secondary | ICD-10-CM

## 2014-10-09 LAB — URINALYSIS, ROUTINE W REFLEX MICROSCOPIC
Bilirubin Urine: NEGATIVE
Ketones, ur: NEGATIVE
Leukocytes, UA: NEGATIVE
Nitrite: NEGATIVE
Specific Gravity, Urine: 1.02 (ref 1.000–1.030)
Total Protein, Urine: NEGATIVE
Urine Glucose: NEGATIVE
Urobilinogen, UA: 0.2 (ref 0.0–1.0)
pH: 6 (ref 5.0–8.0)

## 2014-10-09 NOTE — Telephone Encounter (Signed)
Orders entered for UA and Culture dx recurrent UTI.

## 2014-10-09 NOTE — Telephone Encounter (Signed)
There is no reason for urology evaluation of UTI at this time unless patient fails to respnd to this course of anabiotic. When patient has completed anabiotic's, please return for repeat urinalysis with urine culture (okay to enter future orders for same) Also okay to generate medical letter stating patient is unable to travel at this time because of ongoing medical illness. I will sign same and then I would recommend faxing to patient's airline or travel agent if patient is unable to pick up the letter

## 2014-10-10 NOTE — Telephone Encounter (Signed)
Letter has been typed and stamped with MD sig.

## 2014-10-11 LAB — URINE CULTURE
Colony Count: NO GROWTH
Organism ID, Bacteria: NO GROWTH

## 2014-10-14 ENCOUNTER — Ambulatory Visit (INDEPENDENT_AMBULATORY_CARE_PROVIDER_SITE_OTHER): Payer: PPO | Admitting: Internal Medicine

## 2014-10-14 ENCOUNTER — Encounter: Payer: Self-pay | Admitting: Internal Medicine

## 2014-10-14 ENCOUNTER — Telehealth: Payer: Self-pay | Admitting: Internal Medicine

## 2014-10-14 VITALS — BP 130/80 | HR 68 | Temp 98.1°F | Resp 16 | Ht 61.5 in | Wt 190.0 lb

## 2014-10-14 DIAGNOSIS — Z85038 Personal history of other malignant neoplasm of large intestine: Secondary | ICD-10-CM

## 2014-10-14 DIAGNOSIS — R0600 Dyspnea, unspecified: Secondary | ICD-10-CM | POA: Insufficient documentation

## 2014-10-14 DIAGNOSIS — R0609 Other forms of dyspnea: Secondary | ICD-10-CM

## 2014-10-14 DIAGNOSIS — I10 Essential (primary) hypertension: Secondary | ICD-10-CM | POA: Diagnosis not present

## 2014-10-14 NOTE — Progress Notes (Signed)
Pre visit review using our clinic review tool, if applicable. No additional management support is needed unless otherwise documented below in the visit note. 

## 2014-10-14 NOTE — Telephone Encounter (Signed)
Letter from airlines for trip reimbursement received. Form is filled out and awaiting for MD signature.

## 2014-10-14 NOTE — Progress Notes (Signed)
Subjective:  Patient ID: Kathryn Romero, female    DOB: [birth year]  Age: 79 y.o. MRN: [id number]  CC: Hypertension   HPI Kathryn Romero presents for SOB and DOE - she is new to me, it appears to me that symptoms started about 1 month ago with a NP cough and trip to the ER for SOB, she ruled out for CHF and MI and d-dimer was not significantly elevated, her CXR was normal. The cough has resolved but she complains or worsening fatigue, SOB, DOE, and an 8-10 pound weight gain. She was recently treated for a UTI but all those symptoms have resolved and her recent urine clx was negative.  Outpatient Prescriptions Prior to Visit  Medication Sig Dispense Refill  . benzonatate (TESSALON) 200 MG capsule Take 1 capsule (200 mg total) by mouth 2 (two) times daily as needed for cough. (Patient not taking: Reported on 10/14/2014) 20 capsule 0  . levofloxacin (LEVAQUIN) 500 MG tablet Take 1 tablet (500 mg total) by mouth daily. (Patient not taking: Reported on 10/14/2014) 7 tablet 0   No facility-administered medications prior to visit.    ROS Review of Systems  Constitutional: Positive for appetite change, fatigue and unexpected weight change. Negative for fever, chills, diaphoresis and activity change.  HENT: Negative.  Negative for trouble swallowing and voice change.   Eyes: Negative.   Respiratory: Positive for shortness of breath. Negative for apnea, cough, choking, chest tightness, wheezing and stridor.   Cardiovascular: Negative.  Negative for chest pain, palpitations and leg swelling.  Gastrointestinal: Negative.  Negative for vomiting, abdominal pain, diarrhea, constipation and blood in stool.  Endocrine: Negative.   Genitourinary: Negative.  Negative for dysuria, urgency, frequency, hematuria, flank pain, decreased urine volume, enuresis and difficulty urinating.  Musculoskeletal: Negative.  Negative for myalgias, back pain, joint swelling and arthralgias.  Skin: Negative.     Allergic/Immunologic: Negative.   Neurological: Negative.  Negative for dizziness, tremors, syncope, light-headedness, numbness and headaches.  Hematological: Negative.  Negative for adenopathy. Does not bruise/bleed easily.  Psychiatric/Behavioral: Negative.     Objective:  BP 130/80 mmHg  Pulse 68  Temp(Src) 98.1 F (36.7 C) (Oral)  Resp 16  Ht 5' 1.5" (1.562 m)  Wt 190 lb (86.183 kg)  BMI 35.32 kg/m2  SpO2 98%  BP Readings from Last 3 Encounters:  10/14/14 130/80  09/25/14 140/68  09/18/14 120/72    Wt Readings from Last 3 Encounters:  10/14/14 190 lb (86.183 kg)  09/25/14 184 lb (83.462 kg)  09/18/14 183 lb 12.8 oz (83.371 kg)    Physical Exam  Constitutional: She is oriented to person, place, and time. She appears well-developed and well-nourished. No distress.  HENT:  Head: Normocephalic and atraumatic.  Mouth/Throat: Oropharynx is clear and moist. No oropharyngeal exudate.  Eyes: Conjunctivae are normal. Right eye exhibits no discharge. Left eye exhibits no discharge. No scleral icterus.  Neck: Normal range of motion. Neck supple. No JVD present. No tracheal deviation present. No thyromegaly present.  Cardiovascular: Normal rate, regular rhythm, normal heart sounds and intact distal pulses.  Exam reveals no gallop and no friction rub.   No murmur heard. EKG is normal - no LVH, no q waves, no ST/T wave changes  Pulmonary/Chest: Effort normal and breath sounds normal. No stridor. No respiratory distress. She has no wheezes. She has no rales. She exhibits no tenderness.  Abdominal: Soft. Bowel sounds are normal. She exhibits no distension and no mass. There is no tenderness. There is no  rebound and no guarding.  Musculoskeletal: Normal range of motion. She exhibits no edema or tenderness.  Lymphadenopathy:    She has no cervical adenopathy.  Neurological: She is oriented to person, place, and time.  Skin: Skin is warm and dry. No rash noted. She is not diaphoretic.  No erythema. No pallor.  Vitals reviewed.   Lab Results  Component Value Date   WBC 5.9 09/14/2014   HGB 13.7 09/14/2014   HCT 41.9 09/14/2014   PLT 184 09/14/2014   GLUCOSE 99 09/18/2014   CHOL 261* 05/20/2014   TRIG 98.0 05/20/2014   HDL 71.50 05/20/2014   LDLDIRECT 198.1 08/16/2012   LDLCALC 170* 05/20/2014   ALT 37 09/14/2014   AST 28 09/14/2014   NA 141 09/18/2014   K 3.6 09/18/2014   CL 106 09/18/2014   CREATININE 0.66 09/18/2014   BUN 20 09/18/2014   CO2 28 09/18/2014   TSH 0.99 09/18/2014   HGBA1C 5.9 05/20/2014    Dg Chest 2 View  09/25/2014   CLINICAL DATA:  Cough, congestion, and weakness for the past 2 weeks; history of previous episodes of pneumonia and bronchitis, nonsmoker.  EXAM: CHEST  2 VIEW  COMPARISON:  PA and lateral chest x-ray of May 18, 2011  FINDINGS: The lungs are adequately inflated. There is no focal infiltrate. There is no pleural effusion or pneumothorax. The cardiac silhouette is top-normal in size. The pulmonary vascularity is normal. There is tortuosity of the descending thoracic aorta. There is mild multilevel degenerative disc disease.  IMPRESSION: There is no active cardiopulmonary disease.   Electronically Signed   By: David  Martinique M.D.   On: 09/25/2014 12:39    Assessment & Plan:   Issabelle was seen today for hypertension.  Diagnoses and all orders for this visit:  DOE (dyspnea on exertion) - her EKG is normal today and she appears to have a normal fluid status, recent labs do not indicate a cause for SOB or DOE, she is high risk for CAD so will get a thallium scan done and will look at an ECHO as well to see if there is diastolic dysfunction. Orders: -     Myocardial Perfusion Imaging; Future -     ECHOCARDIOGRAM COMPLETE; Future -     EKG 12-Lead  History of malignant neoplasm of large intestine  I have discontinued Ms. Grismore's levofloxacin and benzonatate.  No orders of the defined types were placed in this encounter.       Follow-up: Return in about 2 months (around 12/14/2014).  Scarlette Calico, MD

## 2014-10-14 NOTE — Assessment & Plan Note (Signed)
Her BP is well controlled 

## 2014-10-14 NOTE — Patient Instructions (Signed)

## 2014-10-14 NOTE — Telephone Encounter (Signed)
Please call patient today. She has a couple of questions regarding the paperwork you gave her today.

## 2014-10-14 NOTE — Telephone Encounter (Signed)
Called pt back. She wanted [place] to know that she is breathing better and will see the cardiologist as suggested by Dr. Ronnald Ramp.

## 2014-10-23 ENCOUNTER — Encounter: Payer: Self-pay | Admitting: Internal Medicine

## 2014-10-29 DIAGNOSIS — E785 Hyperlipidemia, unspecified: Secondary | ICD-10-CM | POA: Insufficient documentation

## 2014-10-31 ENCOUNTER — Telehealth (HOSPITAL_COMMUNITY): Payer: Self-pay | Admitting: *Deleted

## 2014-10-31 NOTE — Telephone Encounter (Signed)
Patient given detailed instructions per Myocardial Perfusion Study Information Sheet for test on 11/05/14 at  11:45. Patient Notified to arrive 15 minutes early, and that it is imperative to arrive on time for appointment to keep from having the test rescheduled. Patient verbalized understanding. Hubbard Robinson, RN

## 2014-11-05 ENCOUNTER — Ambulatory Visit (HOSPITAL_BASED_OUTPATIENT_CLINIC_OR_DEPARTMENT_OTHER): Payer: PPO

## 2014-11-05 ENCOUNTER — Ambulatory Visit (HOSPITAL_COMMUNITY): Payer: PPO | Attending: Internal Medicine

## 2014-11-05 ENCOUNTER — Other Ambulatory Visit: Payer: Self-pay

## 2014-11-05 DIAGNOSIS — R0609 Other forms of dyspnea: Secondary | ICD-10-CM

## 2014-11-05 DIAGNOSIS — R0600 Dyspnea, unspecified: Secondary | ICD-10-CM

## 2014-11-05 LAB — MYOCARDIAL PERFUSION IMAGING
LV dias vol: 76 mL
LV sys vol: 16 mL
Peak HR: 81 {beats}/min
RATE: 0.27
Rest HR: 59 {beats}/min
SDS: 2
SRS: 1
SSS: 3
TID: 0.96

## 2014-11-05 MED ORDER — TECHNETIUM TC 99M SESTAMIBI GENERIC - CARDIOLITE
30.5000 | Freq: Once | INTRAVENOUS | Status: AC | PRN
  Administered 2014-11-05: 31 via INTRAVENOUS

## 2014-11-05 MED ORDER — REGADENOSON 0.4 MG/5ML IV SOLN
0.4000 mg | Freq: Once | INTRAVENOUS | Status: AC
  Administered 2014-11-05: 0.4 mg via INTRAVENOUS

## 2014-11-05 MED ORDER — TECHNETIUM TC 99M SESTAMIBI GENERIC - CARDIOLITE
10.4000 | Freq: Once | INTRAVENOUS | Status: AC | PRN
  Administered 2014-11-05: 10 via INTRAVENOUS

## 2014-11-08 ENCOUNTER — Ambulatory Visit (HOSPITAL_COMMUNITY): Payer: PPO

## 2014-11-20 DIAGNOSIS — K219 Gastro-esophageal reflux disease without esophagitis: Secondary | ICD-10-CM | POA: Diagnosis present

## 2015-01-21 ENCOUNTER — Ambulatory Visit: Payer: PPO | Admitting: Internal Medicine

## 2015-03-31 ENCOUNTER — Ambulatory Visit: Payer: PPO | Admitting: Internal Medicine

## 2015-05-22 DIAGNOSIS — N390 Urinary tract infection, site not specified: Secondary | ICD-10-CM | POA: Diagnosis not present

## 2015-05-22 DIAGNOSIS — E784 Other hyperlipidemia: Secondary | ICD-10-CM | POA: Diagnosis not present

## 2015-05-22 DIAGNOSIS — R8299 Other abnormal findings in urine: Secondary | ICD-10-CM | POA: Diagnosis not present

## 2015-05-22 DIAGNOSIS — I10 Essential (primary) hypertension: Secondary | ICD-10-CM | POA: Diagnosis not present

## 2015-05-23 ENCOUNTER — Other Ambulatory Visit: Payer: Self-pay | Admitting: Ophthalmology

## 2015-05-23 DIAGNOSIS — L821 Other seborrheic keratosis: Secondary | ICD-10-CM | POA: Diagnosis not present

## 2015-05-23 DIAGNOSIS — Z961 Presence of intraocular lens: Secondary | ICD-10-CM | POA: Diagnosis not present

## 2015-05-23 DIAGNOSIS — H3552 Pigmentary retinal dystrophy: Secondary | ICD-10-CM | POA: Diagnosis not present

## 2015-05-23 DIAGNOSIS — D2312 Other benign neoplasm of skin of left eyelid, including canthus: Secondary | ICD-10-CM | POA: Diagnosis not present

## 2015-05-29 DIAGNOSIS — E7439 Other disorders of intestinal carbohydrate absorption: Secondary | ICD-10-CM | POA: Diagnosis not present

## 2015-05-29 DIAGNOSIS — M4806 Spinal stenosis, lumbar region: Secondary | ICD-10-CM | POA: Diagnosis not present

## 2015-05-29 DIAGNOSIS — I10 Essential (primary) hypertension: Secondary | ICD-10-CM | POA: Diagnosis not present

## 2015-05-29 DIAGNOSIS — R0609 Other forms of dyspnea: Secondary | ICD-10-CM | POA: Diagnosis not present

## 2015-05-29 DIAGNOSIS — Z6836 Body mass index (BMI) 36.0-36.9, adult: Secondary | ICD-10-CM | POA: Diagnosis not present

## 2015-05-29 DIAGNOSIS — E784 Other hyperlipidemia: Secondary | ICD-10-CM | POA: Diagnosis not present

## 2015-05-29 DIAGNOSIS — F419 Anxiety disorder, unspecified: Secondary | ICD-10-CM | POA: Diagnosis not present

## 2015-05-29 DIAGNOSIS — K5790 Diverticulosis of intestine, part unspecified, without perforation or abscess without bleeding: Secondary | ICD-10-CM | POA: Diagnosis not present

## 2015-05-29 DIAGNOSIS — Z23 Encounter for immunization: Secondary | ICD-10-CM | POA: Diagnosis not present

## 2015-05-29 DIAGNOSIS — Z1389 Encounter for screening for other disorder: Secondary | ICD-10-CM | POA: Diagnosis not present

## 2015-05-29 DIAGNOSIS — K219 Gastro-esophageal reflux disease without esophagitis: Secondary | ICD-10-CM | POA: Diagnosis not present

## 2015-05-29 DIAGNOSIS — Z0000 Encounter for general adult medical examination without abnormal findings: Secondary | ICD-10-CM | POA: Diagnosis not present

## 2015-06-04 ENCOUNTER — Emergency Department (HOSPITAL_COMMUNITY)
Admission: EM | Admit: 2015-06-04 | Discharge: 2015-06-04 | Disposition: A | Discharge status: Home / Self Care | Payer: PPO | Attending: Emergency Medicine | Admitting: Emergency Medicine

## 2015-06-04 ENCOUNTER — Encounter (HOSPITAL_COMMUNITY): Payer: Self-pay

## 2015-06-04 DIAGNOSIS — Z85038 Personal history of other malignant neoplasm of large intestine: Secondary | ICD-10-CM | POA: Diagnosis not present

## 2015-06-04 DIAGNOSIS — Z862 Personal history of diseases of the blood and blood-forming organs and certain disorders involving the immune mechanism: Secondary | ICD-10-CM | POA: Insufficient documentation

## 2015-06-04 DIAGNOSIS — Y9389 Activity, other specified: Secondary | ICD-10-CM | POA: Insufficient documentation

## 2015-06-04 DIAGNOSIS — I10 Essential (primary) hypertension: Secondary | ICD-10-CM | POA: Diagnosis not present

## 2015-06-04 DIAGNOSIS — X58XXXA Exposure to other specified factors, initial encounter: Secondary | ICD-10-CM | POA: Insufficient documentation

## 2015-06-04 DIAGNOSIS — Z8739 Personal history of other diseases of the musculoskeletal system and connective tissue: Secondary | ICD-10-CM | POA: Insufficient documentation

## 2015-06-04 DIAGNOSIS — Y998 Other external cause status: Secondary | ICD-10-CM | POA: Insufficient documentation

## 2015-06-04 DIAGNOSIS — Z8669 Personal history of other diseases of the nervous system and sense organs: Secondary | ICD-10-CM | POA: Diagnosis not present

## 2015-06-04 DIAGNOSIS — T7840XA Allergy, unspecified, initial encounter: Secondary | ICD-10-CM | POA: Diagnosis not present

## 2015-06-04 DIAGNOSIS — Z79899 Other long term (current) drug therapy: Secondary | ICD-10-CM | POA: Diagnosis not present

## 2015-06-04 DIAGNOSIS — E785 Hyperlipidemia, unspecified: Secondary | ICD-10-CM | POA: Diagnosis not present

## 2015-06-04 DIAGNOSIS — Z8659 Personal history of other mental and behavioral disorders: Secondary | ICD-10-CM | POA: Insufficient documentation

## 2015-06-04 DIAGNOSIS — Y9289 Other specified places as the place of occurrence of the external cause: Secondary | ICD-10-CM | POA: Diagnosis not present

## 2015-06-04 MED ORDER — PREDNISONE 50 MG PO TABS: ORAL_TABLET | ORAL | Status: DC

## 2015-06-04 MED ORDER — FAMOTIDINE IN NACL 20-0.9 MG/50ML-% IV SOLN: 20.0000 mg | Freq: Once | INTRAVENOUS | Status: DC

## 2015-06-04 MED ORDER — SODIUM CHLORIDE 0.9 % IV BOLUS (SEPSIS): 500.0000 mL | Freq: Once | INTRAVENOUS | Status: DC

## 2015-06-04 MED ORDER — METHYLPREDNISOLONE SODIUM SUCC 125 MG IJ SOLR: 125.0000 mg | Freq: Once | INTRAMUSCULAR | Status: DC

## 2015-06-04 MED ORDER — DIPHENHYDRAMINE HCL 50 MG/ML IJ SOLN: 12.5000 mg | Freq: Once | INTRAMUSCULAR | Status: DC

## 2015-06-04 NOTE — ED Notes (Signed)
Verbalized understanding discharge instructions. In no acute distress.   

## 2015-06-04 NOTE — ED Notes (Signed)
Per pt, a week ago seen by EMS for allergic reaction. ? Shrimp.  Today ate salmon.  Same effect.  Pt has burning over entire body.  Feels some difficulty swallowing with SOB

## 2015-06-04 NOTE — ED Notes (Signed)
MD at bedside. EDP PRESENT 

## 2015-06-04 NOTE — Discharge Instructions (Signed)
Allergies An allergy is when your body reacts to a substance in a way that is not normal. An allergic reaction can happen after you:  Eat something.  Breathe in something.  Touch something. WHAT KINDS OF ALLERGIES ARE THERE? You can be allergic to:  Things that are only around during certain seasons, like molds and pollens.  Foods.  Drugs.  Insects.  Animal dander. WHAT ARE SYMPTOMS OF ALLERGIES?  Puffiness (swelling). This may happen on the lips, face, tongue, mouth, or throat.  Sneezing.  Coughing.  Breathing loudly (wheezing).  Stuffy nose.  Tingling in the mouth.  A rash.  Itching.  Itchy, red, puffy areas of skin (hives).  Watery eyes.  Throwing up (vomiting).  Watery poop (diarrhea).  Dizziness.  Feeling faint or fainting.  Trouble breathing or swallowing.  A tight feeling in the chest.  A fast heartbeat. HOW ARE ALLERGIES DIAGNOSED? Allergies can be diagnosed with:  A medical and family history.  Skin tests.  Blood tests.  A food diary. A food diary is a record of all the foods, drinks, and symptoms you have each day.  The results of an elimination diet. This diet involves making sure not to eat certain foods and then seeing what happens when you start eating them again. HOW ARE ALLERGIES TREATED? There is no cure for allergies, but allergic reactions can be treated with medicine. Severe reactions usually need to be treated at a hospital.  HOW CAN REACTIONS BE PREVENTED? The best way to prevent an allergic reaction is to avoid the thing you are allergic to. Allergy shots and medicines can also help prevent reactions in some cases.   This information is not intended to replace advice given to you by your health care provider. Make sure you discuss any questions you have with your health care provider.   Document Released: 08/21/2012 Document Revised: 05/17/2014 Document Reviewed: 02/05/2014 Elsevier Interactive Patient Education 2016  [place] may need see an allergist. Discuss this with your primary care doctor. Prescription for prednisone. Take 1 tablet after onset of symptoms. Can also take 1-2 Benadryl tablets.

## 2015-06-04 NOTE — ED Provider Notes (Signed)
CSN: [id number]     Arrival date & time 06/04/15  1359 History   First MD Initiated Contact with Patient 06/04/15 1504     Chief Complaint  Patient presents with  . Allergic Reaction     (Consider location/radiation/quality/duration/timing/severity/associated sxs/prior Treatment) HPI..... Sensation of feeling on fire, rash, trouble swallowing after eating salmon today. Symptoms have now cleared. Similar episode approximately 1 week ago with a shrimp. She has never had allergies to either shrimp or fish in the past. Severity of symptoms is moderate. She took nothing at home today.  Past Medical History  Diagnosis Date  . GLUCOSE INTOLERANCE   . RETINITIS PIGMENTOSA   . COLON CANCER, HX OF   . ANEMIA-NOS   . ANXIETY   . HYPERLIPIDEMIA   . HYPERTENSION   . SYNDROME, CARPAL TUNNEL   . Cancer [facility]) 2005    colon cancer  . Lumbar spinal stenosis     injections by Ramos 03/2014 with improvement in pain sx   Past Surgical History  Procedure Laterality Date  . Appendectomy  1946  . S/p right hemicolectomy  03/2003  . Carapl tunnel release Left 1990  . Abdominal hysterectomy  1989  . Cataract surgery  1995  . Oophorectomy  1989  . S/p ganglion cyst  1973   Family History  Problem Relation Age of Onset  . ALS Cousin   . Polymyalgia rheumatica Cousin   . Colon cancer Neg Hx    Social History  Substance Use Topics  . Smoking status: Never Smoker   . Smokeless tobacco: Never Used  . Alcohol Use: No   OB History    No data available     Review of Systems  All other systems reviewed and are negative.     Allergies  Codeine; Hydromet; Fish allergy; Morphine; Statins; and Sulfa antibiotics  Home Medications   Prior to Admission medications   Medication Sig Start Date End Date Taking? Authorizing Provider  AFRIN NASAL SPRAY 0.05 % nasal spray USE 2 SPRAYS IN EACH NOSTRIL TWICE DAILY AS NEEDED ALLERGIES 05/07/15  Yes Historical Provider, MD  Multiple Vitamins-Minerals  (MULTIVITAMIN & MINERAL PO) Take 1 tablet by mouth daily.   Yes Historical Provider, MD  pravastatin (PRAVACHOL) 20 MG tablet Take 1 tablet by moth at bedtime 05/29/15  Yes Historical Provider, MD  pseudoephedrine-guaifenesin (MUCINEX D) 60-600 MG 12 hr tablet Take 1 tablet by mouth every 12 (twelve) hours as needed for congestion.   Yes Historical Provider, MD  predniSONE (DELTASONE) 50 MG tablet Take 1 tablet after onset of allergic symptoms. 06/04/15   Nat Christen, MD   BP 136/65 mmHg  Pulse 74  Temp(Src) 97.5 F (36.4 C) (Oral)  Resp 18  SpO2 98% Physical Exam  Constitutional: She is oriented to person, place, and time. She appears well-developed and well-nourished.  HENT:  Head: Normocephalic and atraumatic.  Eyes: Conjunctivae and EOM are normal. Pupils are equal, round, and reactive to light.  Neck: Normal range of motion. Neck supple.  Cardiovascular: Normal rate and regular rhythm.   Pulmonary/Chest: Effort normal and breath sounds normal.  Abdominal: Soft. Bowel sounds are normal.  Musculoskeletal: Normal range of motion.  Neurological: She is alert and oriented to person, place, and time.  Skin: Skin is warm and dry.  Psychiatric: She has a normal mood and affect. Her behavior is normal.  Nursing note and vitals reviewed.   ED Course  Procedures (including critical care time) Labs Review Labs Reviewed - No data  to display  Imaging Review No results found. I have personally reviewed and evaluated these images and lab results as part of my medical decision-making.   EKG Interpretation None      MDM   Final diagnoses:  Allergic reaction, initial encounter    Patient is in no acute distress. Her allergic phenomena now  improved. Prescription for prednisone to take immediately after allergic symptoms.  Also recommended Benadryl.    Nat Christen, MD 06/04/15 [phone]

## 2015-07-28 DIAGNOSIS — M4806 Spinal stenosis, lumbar region: Secondary | ICD-10-CM | POA: Diagnosis not present

## 2015-07-28 DIAGNOSIS — G894 Chronic pain syndrome: Secondary | ICD-10-CM | POA: Diagnosis not present

## 2015-07-28 DIAGNOSIS — M546 Pain in thoracic spine: Secondary | ICD-10-CM | POA: Diagnosis not present

## 2015-07-28 DIAGNOSIS — M5416 Radiculopathy, lumbar region: Secondary | ICD-10-CM | POA: Diagnosis not present

## 2015-08-06 DIAGNOSIS — M4806 Spinal stenosis, lumbar region: Secondary | ICD-10-CM | POA: Diagnosis not present

## 2015-08-06 DIAGNOSIS — M5416 Radiculopathy, lumbar region: Secondary | ICD-10-CM | POA: Diagnosis not present

## 2015-08-14 DIAGNOSIS — G894 Chronic pain syndrome: Secondary | ICD-10-CM | POA: Diagnosis not present

## 2015-08-14 DIAGNOSIS — M4806 Spinal stenosis, lumbar region: Secondary | ICD-10-CM | POA: Diagnosis not present

## 2015-08-14 DIAGNOSIS — M5416 Radiculopathy, lumbar region: Secondary | ICD-10-CM | POA: Diagnosis not present

## 2015-09-01 DIAGNOSIS — I10 Essential (primary) hypertension: Secondary | ICD-10-CM | POA: Diagnosis not present

## 2015-09-01 DIAGNOSIS — F419 Anxiety disorder, unspecified: Secondary | ICD-10-CM | POA: Diagnosis not present

## 2015-09-01 DIAGNOSIS — Z6836 Body mass index (BMI) 36.0-36.9, adult: Secondary | ICD-10-CM | POA: Diagnosis not present

## 2015-09-01 DIAGNOSIS — M4806 Spinal stenosis, lumbar region: Secondary | ICD-10-CM | POA: Diagnosis not present

## 2015-09-01 DIAGNOSIS — E784 Other hyperlipidemia: Secondary | ICD-10-CM | POA: Diagnosis not present

## 2015-10-07 DIAGNOSIS — M4316 Spondylolisthesis, lumbar region: Secondary | ICD-10-CM | POA: Diagnosis not present

## 2015-10-07 DIAGNOSIS — M545 Low back pain: Secondary | ICD-10-CM | POA: Diagnosis not present

## 2015-10-07 DIAGNOSIS — M25551 Pain in right hip: Secondary | ICD-10-CM | POA: Diagnosis not present

## 2015-10-07 DIAGNOSIS — M5416 Radiculopathy, lumbar region: Secondary | ICD-10-CM | POA: Diagnosis not present

## 2015-10-07 DIAGNOSIS — M4806 Spinal stenosis, lumbar region: Secondary | ICD-10-CM | POA: Diagnosis not present

## 2015-10-08 ENCOUNTER — Other Ambulatory Visit: Payer: Self-pay | Admitting: Neurosurgery

## 2015-11-06 ENCOUNTER — Encounter (HOSPITAL_COMMUNITY): Payer: Self-pay

## 2015-11-06 ENCOUNTER — Encounter (HOSPITAL_COMMUNITY)
Admission: RE | Admit: 2015-11-06 | Discharge: 2015-11-06 | Disposition: A | Discharge status: Home / Self Care | Payer: PPO | Source: Ambulatory Visit | Attending: Neurosurgery | Admitting: Neurosurgery

## 2015-11-06 DIAGNOSIS — Z01812 Encounter for preprocedural laboratory examination: Secondary | ICD-10-CM | POA: Diagnosis not present

## 2015-11-06 DIAGNOSIS — M4316 Spondylolisthesis, lumbar region: Secondary | ICD-10-CM | POA: Insufficient documentation

## 2015-11-06 DIAGNOSIS — Z0183 Encounter for blood typing: Secondary | ICD-10-CM | POA: Diagnosis not present

## 2015-11-06 HISTORY — DX: Personal history of other infectious and parasitic diseases: Z86.19

## 2015-11-06 HISTORY — DX: Nocturia: R35.1

## 2015-11-06 HISTORY — DX: Dorsalgia, unspecified: M54.9

## 2015-11-06 HISTORY — DX: Legal blindness, as defined in USA: H54.8

## 2015-11-06 HISTORY — DX: Weakness: R53.1

## 2015-11-06 HISTORY — DX: Personal history of other medical treatment: Z92.89

## 2015-11-06 HISTORY — DX: Other chronic pain: G89.29

## 2015-11-06 HISTORY — DX: Unspecified osteoarthritis, unspecified site: M19.90

## 2015-11-06 LAB — BASIC METABOLIC PANEL
Anion gap: 7 (ref 5–15)
BUN: 25 mg/dL — ABNORMAL HIGH (ref 6–20)
CO2: 23 mmol/L (ref 22–32)
Calcium: 9.8 mg/dL (ref 8.9–10.3)
Chloride: 110 mmol/L (ref 101–111)
Creatinine, Ser: 0.73 mg/dL (ref 0.44–1.00)
GFR calc Af Amer: >60 mL/min (ref >60)
GFR calc non Af Amer: >60 mL/min (ref >60)
Glucose, Bld: 104 mg/dL — ABNORMAL HIGH (ref 65–99)
Potassium: 4.2 mmol/L (ref 3.5–5.1)
Sodium: 140 mmol/L (ref 135–145)

## 2015-11-06 LAB — CBC
HCT: 40.9 % (ref 36.0–46.0)
Hemoglobin: 13.5 g/dL (ref 12.0–15.0)
MCH: 29.7 pg (ref 26.0–34.0)
MCHC: 33 g/dL (ref 30.0–36.0)
MCV: 90.1 fL (ref 78.0–100.0)
Platelets: 173 10*3/uL (ref 150–400)
RBC: 4.54 MIL/uL (ref 3.87–5.11)
RDW: 13.8 % (ref 11.5–15.5)
WBC: 4.2 10*3/uL (ref 4.0–10.5)

## 2015-11-06 LAB — TYPE AND SCREEN
ABO/RH(D): A NEG
Antibody Screen: NEGATIVE

## 2015-11-06 LAB — SURGICAL PCR SCREEN
MRSA, PCR: NEGATIVE
Staphylococcus aureus: NEGATIVE

## 2015-11-06 LAB — ABO/RH: ABO/RH(D): A NEG

## 2015-11-06 NOTE — Progress Notes (Addendum)
Saw Dr.Brackbill,last visit > 6 yrs ago.Went in as a precautionary  Stress test report in epic from 11/05/14  Echo report in epic from 11/05/14  EKG denies in past yr  CXR denies in past yr  Dr.[name] is Medical Md

## 2015-11-06 NOTE — Pre-Procedure Instructions (Signed)
Cassiopeia G Willow  11/06/2015      [facility] [place], [address] [place] [address] Phone: [phone] Fax: [phone]  [facility] - , [place] - [address] [place] [address] Phone: [phone] Fax: [phone]    Your procedure is scheduled on  Fri, July 7 @ 10:35 AM  Report to [facility] Admitting at 7:30 AM  Call this number if you have problems the morning of surgery:  [phone]   Remember:  Do not eat food or drink liquids after midnight.               Stop taking your Magnesium and Multivitamin. No Goody's,BC's,Aleve,Advil,Motrin,Fish Oil,or any Herbal Medications.    Do not wear jewelry, make-up or nail polish.  Do not wear lotions, powders, or perfumes.    Do not shave 48 hours prior to surgery.    Do not bring valuables to the hospital.  [facility] is not responsible for any belongings or valuables.  Contacts, dentures or bridgework may not be worn into surgery.  Leave your suitcase in the car.  After surgery it may be brought to your room.  For patients admitted to the hospital, discharge time will be determined by your treatment team.  Patients discharged the day of surgery will not be allowed to drive home.    [facility]  Before surgery, you can play an important role.  Because skin is not sterile, your skin needs to be as free of germs as possible.  You can reduce the number of germs on you skin by washing with CHG (chlorahexidine gluconate) soap before surgery.  CHG is an antiseptic cleaner which kills germs and bonds with the skin to continue killing germs even after washing.  Please DO NOT use if you have an allergy to CHG or antibacterial soaps.  If your skin becomes reddened/irritated stop using the CHG and inform your nurse when you arrive at Short Stay.  Do not shave  (including legs and underarms) for at least 48 hours prior to the first CHG shower.  You may shave your face.  Please follow these instructions carefully:   1.  Shower with CHG Soap the night before surgery and the                                morning of Surgery.  2.  If you choose to wash your hair, wash your hair first as usual with your       normal shampoo.  3.  After you shampoo, rinse your hair and body thoroughly to remove the                      Shampoo.  4.  Use CHG as you would any other liquid soap.  You can apply chg directly       to the skin and wash gently with scrungie or a clean washcloth.  5.  Apply the CHG Soap to your body ONLY FROM THE NECK DOWN.        Do not use on open wounds or open sores.  Avoid contact with your eyes,       ears, mouth and genitals (private parts).  Wash genitals (private parts)       with your  normal soap.  6.  Wash thoroughly, paying special attention to the area where your surgery        will be performed.  7.  Thoroughly rinse your body with warm water from the neck down.  8.  DO NOT shower/wash with your normal soap after using and rinsing off       the CHG Soap.  9.  Pat yourself dry with a clean towel.            10.  Wear clean pajamas.            11.  Place clean sheets on your bed the night of your first shower and do not        sleep with pets.  Day of Surgery  Do not apply any lotions/deoderants the morning of surgery.  Please wear clean clothes to the hospital/surgery center.    Please read over the following fact sheets that you were given. Pain Booklet, Coughing and Deep Breathing, MRSA Information and Surgical Site Infection Prevention

## 2015-11-13 MED ORDER — CEFAZOLIN SODIUM-DEXTROSE 2-4 GM/100ML-% IV SOLN
2.0000 g | INTRAVENOUS | Status: AC
  Administered 2015-11-14: 2 g via INTRAVENOUS
  Filled 2015-11-13: qty 100

## 2015-11-13 NOTE — H&P (Signed)
Patient ID:   [id number] Patient: Kathryn Romero  Date of Birth: 14-Aug-1935 Visit Type: Office Visit   Date: 10/07/2015 09:30 AM Provider: Marchia Meiers. Vertell Limber MD   This 80 year old female presents for back pain.  History of Present Illness: 1.  back pain    Kathryn Romero, 80 year old female, visits for evaluation of lumbar and right hip pain.  She also notes recent neck and shoulder discomfort.  She recalls no specific injury, noting falls have been due to her in her midfoot and numbness of both legs and falling (she is legally blind due to retinitis pigmentosa).  She is frustrated with her recent weight gain, but notes exercises cause pain.  She reports lumbar pain, right buttock pain, right thigh pain and right groin pain intermittent throughout the day.  Patient is able to stand up without assistance.  She is not able to walk any distance and has significant back and right leg pain and weakness.  Aleve 1-2 per day  ESI years ago offered no relief  Patient lives by herself.   History: Retinitis pigmentosa/legally blind since 80 yrs old. Surgical history: Cataracts, carpal tunnel release 1975; colon resection 2005; appendectomy 1946  MRI and x-rays on [place]  X-rays shows evidence of mild to moderate arthritis in the bilateral hips. There is disc height loss at L2-3, L4-5. Spondylolisthesis at L4-5 is measured at 7.5 mm on flexion, 5.3 mm on neutral and 5.8 mm on extension.        PAST MEDICAL/SURGICAL HISTORY   (Detailed)     PAST MEDICAL HISTORY, SURGICAL HISTORY, FAMILY HISTORY, SOCIAL HISTORY AND REVIEW OF SYSTEMS I have reviewed the patient's past medical, surgical, family and social history as well as the comprehensive review of systems as included on the [facility] history form dated 10/07/2015, which I have signed.  Family History  (Detailed) Relationship Family Member Name Deceased Age at Death Condition Onset Age Cause of  Death      Family history of Cancer, unknown  N  Father  Y 44     Father  Y 20 Stroke  N  Mother  Y 48 Arthritis  N  Mother  Y 73       SOCIAL HISTORY  (Detailed) Tobacco use reviewed. Preferred language is Unknown.   Smoking status: Never smoker.  SMOKING STATUS Use Status Type Smoking Status Usage Per Day Years Used Total Pack Years  no/never  Never smoker       HOME ENVIRONMENT/SAFETY The patient has not fallen in the last year.        MEDICATIONS(added, continued or stopped this visit): Started Medication Directions Instruction Stopped   Aleve take 2 capsule by oral route 4 times every day       ALLERGIES: Ingredient Reaction Medication Name Comment  MORPHINE     CODEINE     PENICILLIN      Reviewed, updated.   REVIEW OF SYSTEMS System Neg/Pos Details  Constitutional Negative Chills, fatigue, fever, malaise, night sweats, weight gain and weight loss.  ENMT Negative Ear drainage, hearing loss, nasal drainage, otalgia, sinus pressure and sore throat.  Eyes Negative Eye discharge, eye pain and vision changes.  Respiratory Negative Chronic cough, cough, dyspnea, known TB exposure and wheezing.  Cardio Negative Chest pain, claudication, edema and irregular heartbeat/palpitations.  GI Negative Abdominal pain, blood in stool, change in stool pattern, constipation, decreased appetite, diarrhea, heartburn, nausea and vomiting.  GU Negative Dysuria, hematuria, hot flashes, irregular menses, polyuria,  urinary frequency, urinary incontinence and urinary retention.  Endocrine Negative Cold intolerance, heat intolerance, polydipsia and polyphagia.  Neuro Negative Dizziness, extremity weakness, gait disturbance, headache, memory impairment, numbness in extremity, seizures and tremors.  Psych Negative Anxiety, depression and insomnia.  Integumentary Negative Brittle hair, brittle nails, change in shape/size of mole(s), hair loss, hirsutism, hives, pruritus, rash and skin  lesion.  MS Positive Back pain, Joint swelling, Muscle weakness, Neck pain, Leg pain.  MS Negative Joint pain.  Hema/Lymph Negative Easy bleeding, easy bruising and lymphadenopathy.  Allergic/Immuno Negative Contact allergy, environmental allergies, food allergies and seasonal allergies.  Reproductive Negative Breast discharge, breast lump(s), dysmenorrhea, dyspareunia, history of abnormal PAP smear and vaginal discharge.     Vitals Date Temp F BP Pulse Ht In Wt Lb BMI BSA Pain Score  10/07/2015  154/82 80 61 189 35.71  7/10     PHYSICAL EXAM General Level of Distress: no acute distress Overall Appearance: normal  Head and Face  Right Left  Fundoscopic Exam:  normal normal    Cardiovascular Cardiac: regular rate and rhythm without murmur  Right Left  Carotid Pulses: normal normal  Respiratory Lungs: clear to auscultation  Neurological Orientation: normal Recent and Remote Memory: normal Attention Span and Concentration:   normal Language: normal Fund of Knowledge: normal  Right Left Sensation: normal normal Upper Extremity Coordination: normal normal  Lower Extremity Coordination: normal normal  Musculoskeletal Gait and Station: normal  Right Left Upper Extremity Muscle Strength: normal normal Lower Extremity Muscle Strength: normal normal Upper Extremity Muscle Tone:  normal normal Lower Extremity Muscle Tone: normal normal  Motor Strength Upper and lower extremity motor strength was tested in the clinically pertinent muscles. Any abnormal findings will be noted below.   Right Left Tib Anterior: 4+/5  EHL: 4/5    Deep Tendon Reflexes  Right Left Biceps: normal normal Triceps: normal normal Brachiloradialis: normal normal Patellar: normal normal Achilles: normal normal  Sensory Sensation was tested at L1 to S1. Any abnormal findings will be noted below.  Right Left L5: decreased decreased  S1: decreased decreased  Cranial Nerves II. Optic  Nerve/Visual Fields: normal III. Oculomotor: normal IV. Trochlear: normal V. Trigeminal: normal VI. Abducens: normal VII. Facial: normal VIII. Acoustic/Vestibular: normal IX. Glossopharyngeal: normal X. Vagus: normal XI. Spinal Accessory: normal XII. Hypoglossal: normal  Motor and other Tests Lhermittes: negative Rhomberg: negative Pronator drift: absent     Right Left Hoffman's: normal normal Clonus: normal normal Babinski: normal normal SLR: negative negative Patrick's Corky Sox): positive negative Toe Walk: normal normal Toe Lift: normal normal Heel Walk: normal normal SI Joint: nontender nontender   Additional Findings:  optic pallor bilateral. Worse right L5-S1 pin prick sensation. Hip abduction 4/5 on the right.   DIAGNOSTIC RESULTS 10/07/15 MRI: L4-5 with near complete obilteration of the central canal with moderate foraminal stenosis due to a prominent disc bulge, severe facet DJD and mild degenerative spondylolisthesis.  L3-4 with severe central and lateral recess and right foraminal stenosis with compression of both L4 nerve roots on the right L3 nerve root due to a prominent disc bulge and severe facet DJD. L2-3 with moderate stenosis due to a prominent disc bulge and severe facet DJD L5-S1 with severe facet DJD w/o stenosis  X-rays shows evidence of moderate arthritis in the bilateral hip. There is disc height loss at L2-3, L4-5. Spondylolisthesis at L4-5 is measured at 7.5 mm on flexion, 5.3 mm on neutral and 5.8 mm on extension.      IMPRESSION The  patient is experiencing right sided lower back and hip pain that radiates into her groin and right leg. Her imaging shows significant spondylolisthesis at the L4-5 level causing severe stenosis. There is also evidence for stenosis causing bilateral L3, and right L4 nerve root compression. Although she has moderate arthritis in her hips and positive Patrick's on the right, her back issues are more of an immediate  concern at this time. the Patrick's test is not as bad as straight leg raise and leg weakness and I believe it is fairly mild in severity. I think that her bigger issue relates to her severe spinal stenosis and spondylolisthesis of the lumbar spine.  I suggested that if her hip pain progresses we could discuss getting hip injections.  On confrontational exam she does have decreased L5-S1 sensation and weak right hip abduction with dorsiflexion and EHL strength on the right. she has a mobile spondylolisthesis of L5 on S1.  I recommend that she proceed with L3-5 decompression MAS PLIF to alleviate her pain and weakness.  The patient is legally blind and lives by herself. If she proceeds with surgery I recommend that she stay at a nursing facility to care for her  Completed Orders (this encounter) Order Details Reason Side Interpretation Result Initial Treatment Date Region  Lumbar Spine- AP/Lat/Flex/Ex AND 2V RT  HIP     10/07/2015 All Levels to All Levels  Hip - Right Lateral W/AP Pelvis AND 4V L/S     10/07/2015    Assessment/Plan # Detail Type Description   1. Assessment Low back pain, unspecified back pain laterality, with sciatica presence unspecified (M54.5).       2. Assessment Spinal stenosis of lumbar region (M48.06).       3. Assessment Lumbar radiculopathy (M54.16).       4. Assessment Spondylolisthesis, lumbar region (M43.16).       5. Assessment Right hip pain (M25.551).         Pain Assessment/Treatment Pain Scale: 7/10. Method: Numeric Pain Intensity Scale. Location: back. Onset: 08/07/2015. Pain Assessment/Treatment follow-up plan of care: Patient is taking medications prescribed.  Fall Risk Plan The patient has not fallen in the last year.  Schedule for L3-5 MAS PLIF  Orders: Diagnostic Procedures: Assessment Procedure  M25.551 Hip - Right Lateral W/AP Pelvis  M43.16 Lumbar Spine- AP/Lat/Flex/Ex  M43.16 MAS PLIF L3-4, L4-5  M54.16 Lumbar Spine- AP/Lat              Provider:  Marchia Meiers. Vertell Limber MD  10/07/2015 11:21 AM Dictation edited by: Marchia Meiers. Vertell Limber    CC Providers: [place],  [place]  [id number]   [facility] [address] [place] [address] [place], [place] [address]              Electronically signed by Marchia Meiers. Vertell Limber MD on 10/07/2015 11:25 AM

## 2015-11-14 ENCOUNTER — Encounter (HOSPITAL_COMMUNITY)
Admission: RE | Disposition: A | Discharge status: Skilled Nursing Facility | Payer: Self-pay | Source: Ambulatory Visit | Attending: Neurosurgery

## 2015-11-14 ENCOUNTER — Inpatient Hospital Stay (HOSPITAL_COMMUNITY): Payer: PPO | Admitting: Certified Registered Nurse Anesthetist

## 2015-11-14 ENCOUNTER — Encounter (HOSPITAL_COMMUNITY): Payer: Self-pay | Admitting: Surgery

## 2015-11-14 ENCOUNTER — Inpatient Hospital Stay (HOSPITAL_COMMUNITY): Payer: PPO

## 2015-11-14 ENCOUNTER — Inpatient Hospital Stay (HOSPITAL_COMMUNITY)
Admission: RE | Admit: 2015-11-14 | Discharge: 2015-11-18 | DRG: 460 | Disposition: A | Discharge status: Skilled Nursing Facility | Payer: PPO | Source: Ambulatory Visit | Attending: Neurosurgery | Admitting: Neurosurgery

## 2015-11-14 DIAGNOSIS — M545 Low back pain: Secondary | ICD-10-CM | POA: Diagnosis not present

## 2015-11-14 DIAGNOSIS — M4726 Other spondylosis with radiculopathy, lumbar region: Secondary | ICD-10-CM | POA: Diagnosis not present

## 2015-11-14 DIAGNOSIS — M4316 Spondylolisthesis, lumbar region: Secondary | ICD-10-CM | POA: Diagnosis not present

## 2015-11-14 DIAGNOSIS — H548 Legal blindness, as defined in USA: Secondary | ICD-10-CM | POA: Diagnosis present

## 2015-11-14 DIAGNOSIS — D649 Anemia, unspecified: Secondary | ICD-10-CM | POA: Diagnosis not present

## 2015-11-14 DIAGNOSIS — Z419 Encounter for procedure for purposes other than remedying health state, unspecified: Secondary | ICD-10-CM

## 2015-11-14 DIAGNOSIS — M479 Spondylosis, unspecified: Secondary | ICD-10-CM | POA: Diagnosis not present

## 2015-11-14 DIAGNOSIS — M4806 Spinal stenosis, lumbar region: Secondary | ICD-10-CM | POA: Diagnosis present

## 2015-11-14 DIAGNOSIS — Z981 Arthrodesis status: Secondary | ICD-10-CM | POA: Diagnosis not present

## 2015-11-14 DIAGNOSIS — M549 Dorsalgia, unspecified: Secondary | ICD-10-CM | POA: Diagnosis not present

## 2015-11-14 HISTORY — PX: MAXIMUM ACCESS (MAS)POSTERIOR LUMBAR INTERBODY FUSION (PLIF) 2 LEVEL: SHX6369

## 2015-11-14 SURGERY — FOR MAXIMUM ACCESS (MAS) POSTERIOR LUMBAR INTERBODY FUSION (PLIF) 2 LEVEL
Anesthesia: General | Site: Spine Lumbar

## 2015-11-14 MED ORDER — OXYCODONE-ACETAMINOPHEN 5-325 MG PO TABS
1.0000 | ORAL_TABLET | ORAL | Status: DC | PRN
  Administered 2015-11-14 – 2015-11-18 (×6): 2 via ORAL
  Filled 2015-11-14: qty 1
  Filled 2015-11-14: qty 2
  Filled 2015-11-14: qty 1
  Filled 2015-11-14 (×3): qty 2

## 2015-11-14 MED ORDER — FENTANYL CITRATE (PF) 250 MCG/5ML IJ SOLN
INTRAMUSCULAR | Status: AC
  Filled 2015-11-14: qty 5

## 2015-11-14 MED ORDER — ALUM & MAG HYDROXIDE-SIMETH 200-200-20 MG/5ML PO SUSP
30.0000 mL | Freq: Four times a day (QID) | ORAL | Status: DC | PRN
Start: 2015-11-14 — End: 2015-11-18

## 2015-11-14 MED ORDER — KCL IN DEXTROSE-NACL 20-5-0.45 MEQ/L-%-% IV SOLN
INTRAVENOUS | Status: AC
  Filled 2015-11-14: qty 1000

## 2015-11-14 MED ORDER — BUPIVACAINE LIPOSOME 1.3 % IJ SUSP
INTRAMUSCULAR | Status: DC | PRN
Start: 2015-11-14 — End: 2015-11-14
  Administered 2015-11-14: 20 mL

## 2015-11-14 MED ORDER — HYDROMORPHONE HCL 1 MG/ML IJ SOLN
0.5000 mg | INTRAMUSCULAR | Status: DC | PRN
  Administered 2015-11-14 – 2015-11-16 (×6): 1 mg via INTRAVENOUS
  Filled 2015-11-14 (×6): qty 1

## 2015-11-14 MED ORDER — FENTANYL CITRATE (PF) 100 MCG/2ML IJ SOLN
25.0000 ug | INTRAMUSCULAR | Status: DC | PRN
Start: 2015-11-14 — End: 2015-11-14
  Administered 2015-11-14 (×2): 50 ug via INTRAVENOUS

## 2015-11-14 MED ORDER — PHENOL 1.4 % MT LIQD: 1.0000 | OROMUCOSAL | Status: DC | PRN

## 2015-11-14 MED ORDER — PHENYLEPHRINE 40 MCG/ML (10ML) SYRINGE FOR IV PUSH (FOR BLOOD PRESSURE SUPPORT)
PREFILLED_SYRINGE | INTRAVENOUS | Status: AC
  Filled 2015-11-14: qty 10

## 2015-11-14 MED ORDER — LIDOCAINE 2% (20 MG/ML) 5 ML SYRINGE
INTRAMUSCULAR | Status: DC | PRN
  Administered 2015-11-14: 100 mg via INTRAVENOUS

## 2015-11-14 MED ORDER — LACTATED RINGERS IV SOLN
INTRAVENOUS | Status: DC
  Administered 2015-11-14: 08:00:00 via INTRAVENOUS

## 2015-11-14 MED ORDER — KCL IN DEXTROSE-NACL 20-5-0.45 MEQ/L-%-% IV SOLN
INTRAVENOUS | Status: DC
  Administered 2015-11-14 – 2015-11-15 (×2): via INTRAVENOUS
  Filled 2015-11-14 (×9): qty 1000

## 2015-11-14 MED ORDER — ACETAMINOPHEN 325 MG PO TABS: 650.0000 mg | ORAL_TABLET | ORAL | Status: DC | PRN

## 2015-11-14 MED ORDER — SODIUM CHLORIDE 0.9% FLUSH: 3.0000 mL | INTRAVENOUS | Status: DC | PRN

## 2015-11-14 MED ORDER — MEPERIDINE HCL 25 MG/ML IJ SOLN: 6.2500 mg | INTRAMUSCULAR | Status: DC | PRN

## 2015-11-14 MED ORDER — FENTANYL CITRATE (PF) 250 MCG/5ML IJ SOLN
INTRAMUSCULAR | Status: DC | PRN
  Administered 2015-11-14: 100 ug via INTRAVENOUS

## 2015-11-14 MED ORDER — MIDAZOLAM HCL 2 MG/2ML IJ SOLN
INTRAMUSCULAR | Status: AC
  Filled 2015-11-14: qty 2

## 2015-11-14 MED ORDER — DOCUSATE SODIUM 100 MG PO CAPS
100.0000 mg | ORAL_CAPSULE | Freq: Two times a day (BID) | ORAL | Status: DC
  Administered 2015-11-15 – 2015-11-18 (×7): 100 mg via ORAL
  Filled 2015-11-14 (×7): qty 1

## 2015-11-14 MED ORDER — EPHEDRINE SULFATE 50 MG/ML IJ SOLN
INTRAMUSCULAR | Status: DC | PRN
  Administered 2015-11-14 (×3): 5 mg via INTRAVENOUS

## 2015-11-14 MED ORDER — BISACODYL 10 MG RE SUPP: 10.0000 mg | Freq: Every day | RECTAL | Status: DC | PRN

## 2015-11-14 MED ORDER — PROPOFOL 10 MG/ML IV BOLUS
INTRAVENOUS | Status: DC | PRN
  Administered 2015-11-14: 120 mg via INTRAVENOUS

## 2015-11-14 MED ORDER — CEFAZOLIN IN D5W 1 GM/50ML IV SOLN
1.0000 g | Freq: Three times a day (TID) | INTRAVENOUS | Status: AC
  Administered 2015-11-14 – 2015-11-15 (×2): 1 g via INTRAVENOUS
  Filled 2015-11-14 (×2): qty 50

## 2015-11-14 MED ORDER — NAPROXEN 250 MG PO TABS
250.0000 mg | ORAL_TABLET | Freq: Two times a day (BID) | ORAL | Status: DC | PRN
  Administered 2015-11-14 – 2015-11-17 (×6): 250 mg via ORAL
  Filled 2015-11-14 (×7): qty 1

## 2015-11-14 MED ORDER — ZOLPIDEM TARTRATE 5 MG PO TABS
5.0000 mg | ORAL_TABLET | Freq: Every evening | ORAL | Status: DC | PRN
  Administered 2015-11-15 – 2015-11-16 (×2): 5 mg via ORAL
  Filled 2015-11-14 (×2): qty 1

## 2015-11-14 MED ORDER — SODIUM CHLORIDE 0.9% FLUSH
3.0000 mL | Freq: Two times a day (BID) | INTRAVENOUS | Status: DC
  Administered 2015-11-14 – 2015-11-18 (×7): 3 mL via INTRAVENOUS

## 2015-11-14 MED ORDER — SODIUM CHLORIDE 0.9 % IV SOLN: 250.0000 mL | INTRAVENOUS | Status: DC

## 2015-11-14 MED ORDER — ACETAMINOPHEN 650 MG RE SUPP: 650.0000 mg | RECTAL | Status: DC | PRN

## 2015-11-14 MED ORDER — ONDANSETRON HCL 4 MG/2ML IJ SOLN
4.0000 mg | INTRAMUSCULAR | Status: DC | PRN
  Administered 2015-11-14 – 2015-11-15 (×2): 4 mg via INTRAVENOUS
  Filled 2015-11-14 (×2): qty 2

## 2015-11-14 MED ORDER — POLYETHYLENE GLYCOL 3350 17 G PO PACK
17.0000 g | PACK | Freq: Every day | ORAL | Status: DC | PRN
  Administered 2015-11-18: 17 g via ORAL
  Filled 2015-11-14: qty 1

## 2015-11-14 MED ORDER — SODIUM CHLORIDE 0.9 % IV SOLN
0.0125 ug/kg/min | INTRAVENOUS | Status: AC
  Administered 2015-11-14: .1 ug/kg/min via INTRAVENOUS
  Filled 2015-11-14: qty 2000

## 2015-11-14 MED ORDER — LACTATED RINGERS IV SOLN
INTRAVENOUS | Status: DC | PRN
  Administered 2015-11-14: 12:00:00 via INTRAVENOUS

## 2015-11-14 MED ORDER — FAMOTIDINE IN NACL 20-0.9 MG/50ML-% IV SOLN
20.0000 mg | Freq: Two times a day (BID) | INTRAVENOUS | Status: DC
  Administered 2015-11-14 – 2015-11-16 (×5): 20 mg via INTRAVENOUS
  Filled 2015-11-14 (×5): qty 50

## 2015-11-14 MED ORDER — DEXTROSE 5 % IV SOLN
10.0000 mg | INTRAVENOUS | Status: DC | PRN
  Administered 2015-11-14: 20 ug/min via INTRAVENOUS

## 2015-11-14 MED ORDER — METOCLOPRAMIDE HCL 5 MG/ML IJ SOLN: 10.0000 mg | Freq: Once | INTRAMUSCULAR | Status: DC | PRN

## 2015-11-14 MED ORDER — PHENYLEPHRINE HCL 10 MG/ML IJ SOLN
INTRAMUSCULAR | Status: DC | PRN
  Administered 2015-11-14 (×2): 40 ug via INTRAVENOUS

## 2015-11-14 MED ORDER — SUCCINYLCHOLINE CHLORIDE 200 MG/10ML IV SOSY
PREFILLED_SYRINGE | INTRAVENOUS | Status: DC | PRN
  Administered 2015-11-14: 100 mg via INTRAVENOUS

## 2015-11-14 MED ORDER — METHOCARBAMOL 500 MG PO TABS
500.0000 mg | ORAL_TABLET | Freq: Four times a day (QID) | ORAL | Status: DC | PRN
  Administered 2015-11-14 – 2015-11-18 (×8): 500 mg via ORAL
  Filled 2015-11-14 (×9): qty 1

## 2015-11-14 MED ORDER — 0.9 % SODIUM CHLORIDE (POUR BTL) OPTIME
TOPICAL | Status: DC | PRN
  Administered 2015-11-14: 1000 mL

## 2015-11-14 MED ORDER — LIDOCAINE 2% (20 MG/ML) 5 ML SYRINGE
INTRAMUSCULAR | Status: AC
  Filled 2015-11-14: qty 5

## 2015-11-14 MED ORDER — THROMBIN 20000 UNITS EX SOLR
CUTANEOUS | Status: DC | PRN
  Administered 2015-11-14: 13:00:00 via TOPICAL

## 2015-11-14 MED ORDER — BUPIVACAINE LIPOSOME 1.3 % IJ SUSP
20.0000 mL | INTRAMUSCULAR | Status: DC
  Filled 2015-11-14: qty 20

## 2015-11-14 MED ORDER — FLEET ENEMA 7-19 GM/118ML RE ENEM: 1.0000 | ENEMA | Freq: Once | RECTAL | Status: DC | PRN

## 2015-11-14 MED ORDER — ROCURONIUM BROMIDE 50 MG/5ML IV SOLN
INTRAVENOUS | Status: AC
  Filled 2015-11-14: qty 1

## 2015-11-14 MED ORDER — LIDOCAINE-EPINEPHRINE 1 %-1:100000 IJ SOLN
INTRAMUSCULAR | Status: DC | PRN
  Administered 2015-11-14: 9 mL

## 2015-11-14 MED ORDER — PRAVASTATIN SODIUM 20 MG PO TABS
20.0000 mg | ORAL_TABLET | Freq: Every day | ORAL | Status: DC
  Administered 2015-11-14 – 2015-11-16 (×3): 20 mg via ORAL
  Filled 2015-11-14 (×4): qty 1

## 2015-11-14 MED ORDER — BUPIVACAINE HCL (PF) 0.5 % IJ SOLN
INTRAMUSCULAR | Status: DC | PRN
  Administered 2015-11-14: 9 mL

## 2015-11-14 MED ORDER — DEXAMETHASONE SODIUM PHOSPHATE 10 MG/ML IJ SOLN
INTRAMUSCULAR | Status: DC | PRN
  Administered 2015-11-14: 10 mg via INTRAVENOUS

## 2015-11-14 MED ORDER — MENTHOL 3 MG MT LOZG: 1.0000 | LOZENGE | OROMUCOSAL | Status: DC | PRN

## 2015-11-14 MED ORDER — PROPOFOL 500 MG/50ML IV EMUL
INTRAVENOUS | Status: DC | PRN
  Administered 2015-11-14: 75 ug/kg/min via INTRAVENOUS

## 2015-11-14 MED ORDER — METHOCARBAMOL 1000 MG/10ML IJ SOLN
500.0000 mg | Freq: Four times a day (QID) | INTRAVENOUS | Status: DC | PRN
  Filled 2015-11-14: qty 5

## 2015-11-14 MED ORDER — ONDANSETRON HCL 4 MG/2ML IJ SOLN
INTRAMUSCULAR | Status: AC
  Filled 2015-11-14: qty 2

## 2015-11-14 MED ORDER — FENTANYL CITRATE (PF) 100 MCG/2ML IJ SOLN
INTRAMUSCULAR | Status: AC
  Administered 2015-11-14: 50 ug via INTRAVENOUS
  Filled 2015-11-14: qty 2

## 2015-11-14 MED ORDER — OXYCODONE-ACETAMINOPHEN 5-325 MG PO TABS
ORAL_TABLET | ORAL | Status: AC
  Filled 2015-11-14: qty 2

## 2015-11-14 MED ORDER — ONDANSETRON HCL 4 MG/2ML IJ SOLN
INTRAMUSCULAR | Status: DC | PRN
  Administered 2015-11-14: 4 mg via INTRAVENOUS

## 2015-11-14 MED ORDER — HYDROMORPHONE HCL 1 MG/ML IJ SOLN
INTRAMUSCULAR | Status: AC
  Filled 2015-11-14: qty 1

## 2015-11-14 MED ORDER — MIDAZOLAM HCL 2 MG/2ML IJ SOLN
INTRAMUSCULAR | Status: DC | PRN
  Administered 2015-11-14 (×2): 1 mg via INTRAVENOUS

## 2015-11-14 MED ORDER — CEFAZOLIN IN D5W 1 GM/50ML IV SOLN
1.0000 g | Freq: Three times a day (TID) | INTRAVENOUS | Status: DC
  Filled 2015-11-14 (×2): qty 50

## 2015-11-14 SURGICAL SUPPLY — 92 items
ADH SKN CLS APL DERMABOND .7 (GAUZE/BANDAGES/DRESSINGS) ×1
APL SKNCLS STERI-STRIP NONHPOA (GAUZE/BANDAGES/DRESSINGS)
BENZOIN TINCTURE PRP APPL 2/3 (GAUZE/BANDAGES/DRESSINGS) IMPLANT
BIT DRILL PLIF MAS DISP 5.5MM (DRILL) IMPLANT
BLADE CLIPPER SURG (BLADE) IMPLANT
BONE CANC CHIPS 40CC CAN1/2 (Bone Implant) ×6 IMPLANT
BUR MATCHSTICK NEURO 3.0 LAGG (BURR) ×3 IMPLANT
BUR PRECISION FLUTE 5.0 (BURR) ×2 IMPLANT
BUR ROUND FLUTED 5 RND (BURR) ×2 IMPLANT
BUR ROUND FLUTED 5MM RND (BURR) ×1
CAGE COROENT LG 10X9X23-12 (Cage) ×4 IMPLANT
CANISTER SUCT 3000ML PPV (MISCELLANEOUS) ×3 IMPLANT
CHIPS CANC BONE 40CC CAN1/2 (Bone Implant) ×2 IMPLANT
CLIP NEUROVISION LG (CLIP) ×2 IMPLANT
CLOSURE WOUND 1/2 X4 (GAUZE/BANDAGES/DRESSINGS)
CONT SPEC 4OZ CLIKSEAL STRL BL (MISCELLANEOUS) ×3 IMPLANT
COVER BACK TABLE 24X17X13 BIG (DRAPES) IMPLANT
COVER BACK TABLE 60X90IN (DRAPES) ×3 IMPLANT
DECANTER SPIKE VIAL GLASS SM (MISCELLANEOUS) ×3 IMPLANT
DERMABOND ADVANCED (GAUZE/BANDAGES/DRESSINGS) ×2
DERMABOND ADVANCED .7 DNX12 (GAUZE/BANDAGES/DRESSINGS) ×1 IMPLANT
DRAPE C-ARM 42X72 X-RAY (DRAPES) ×3 IMPLANT
DRAPE C-ARMOR (DRAPES) ×3 IMPLANT
DRAPE LAPAROTOMY 100X72X124 (DRAPES) ×3 IMPLANT
DRAPE POUCH INSTRU U-SHP 10X18 (DRAPES) ×3 IMPLANT
DRAPE SURG 17X23 STRL (DRAPES) ×3 IMPLANT
DRILL PLIF MAS DISP 5.5MM (DRILL) ×3
DRSG OPSITE POSTOP 4X6 (GAUZE/BANDAGES/DRESSINGS) ×2 IMPLANT
DURAPREP 26ML APPLICATOR (WOUND CARE) ×3 IMPLANT
ELECT BLADE 4.0 EZ CLEAN MEGAD (MISCELLANEOUS) ×9
ELECT REM PT RETURN 9FT ADLT (ELECTROSURGICAL) ×3
ELECTRODE BLDE 4.0 EZ CLN MEGD (MISCELLANEOUS) IMPLANT
ELECTRODE REM PT RTRN 9FT ADLT (ELECTROSURGICAL) ×1 IMPLANT
EVACUATOR 1/8 PVC DRAIN (DRAIN) IMPLANT
GAUZE SPONGE 4X4 12PLY STRL (GAUZE/BANDAGES/DRESSINGS) ×3 IMPLANT
GAUZE SPONGE 4X4 16PLY XRAY LF (GAUZE/BANDAGES/DRESSINGS) IMPLANT
GLOVE BIO SURGEON STRL SZ8 (GLOVE) ×6 IMPLANT
GLOVE BIOGEL PI IND STRL 7.0 (GLOVE) IMPLANT
GLOVE BIOGEL PI IND STRL 7.5 (GLOVE) IMPLANT
GLOVE BIOGEL PI IND STRL 8 (GLOVE) ×2 IMPLANT
GLOVE BIOGEL PI IND STRL 8.5 (GLOVE) ×2 IMPLANT
GLOVE BIOGEL PI INDICATOR 7.0 (GLOVE) ×4
GLOVE BIOGEL PI INDICATOR 7.5 (GLOVE) ×6
GLOVE BIOGEL PI INDICATOR 8 (GLOVE) ×10
GLOVE BIOGEL PI INDICATOR 8.5 (GLOVE) ×4
GLOVE ECLIPSE 8.0 STRL XLNG CF (GLOVE) ×6 IMPLANT
GLOVE EXAM NITRILE LRG STRL (GLOVE) IMPLANT
GLOVE EXAM NITRILE MD LF STRL (GLOVE) IMPLANT
GLOVE EXAM NITRILE XL STR (GLOVE) IMPLANT
GLOVE EXAM NITRILE XS STR PU (GLOVE) IMPLANT
GLOVE SURG SS PI 6.5 STRL IVOR (GLOVE) ×6 IMPLANT
GOWN STRL REUS W/ TWL LRG LVL3 (GOWN DISPOSABLE) IMPLANT
GOWN STRL REUS W/ TWL XL LVL3 (GOWN DISPOSABLE) ×3 IMPLANT
GOWN STRL REUS W/TWL 2XL LVL3 (GOWN DISPOSABLE) ×4 IMPLANT
GOWN STRL REUS W/TWL LRG LVL3 (GOWN DISPOSABLE) ×6
GOWN STRL REUS W/TWL XL LVL3 (GOWN DISPOSABLE) ×9
GRAFT BNE CHIP CANC 1-8 40 (Bone Implant) IMPLANT
KIT BASIN OR (CUSTOM PROCEDURE TRAY) ×3 IMPLANT
KIT POSITION SURG JACKSON T1 (MISCELLANEOUS) ×3 IMPLANT
KIT ROOM TURNOVER OR (KITS) ×3 IMPLANT
MILL MEDIUM DISP (BLADE) ×3 IMPLANT
MODULE NVM5 NEXT GEN EMG (NEEDLE) ×2 IMPLANT
NDL HYPO 21X1 ECLIPSE (NEEDLE) IMPLANT
NDL HYPO 25X1 1.5 SAFETY (NEEDLE) ×1 IMPLANT
NEEDLE HYPO 21X1 ECLIPSE (NEEDLE) ×3 IMPLANT
NEEDLE HYPO 25X1 1.5 SAFETY (NEEDLE) ×3 IMPLANT
NS IRRIG 1000ML POUR BTL (IV SOLUTION) ×3 IMPLANT
PACK LAMINECTOMY NEURO (CUSTOM PROCEDURE TRAY) ×3 IMPLANT
PAD ARMBOARD 7.5X6 YLW CONV (MISCELLANEOUS) ×9 IMPLANT
ROD 55MM (Rod) ×6 IMPLANT
ROD SPNL 55XPREBNT NS MAS (Rod) IMPLANT
SCREW LOCK (Screw) ×18 IMPLANT
SCREW LOCK FXNS SPNE MAS PL (Screw) IMPLANT
SCREW PLIF MAS 5.5X35 LUMBAR (Screw) ×8 IMPLANT
SCREW SHANKS 5.5X35 (Screw) ×4 IMPLANT
SCREW TULIP 5.5 (Screw) ×4 IMPLANT
SPONGE LAP 4X18 X RAY DECT (DISPOSABLE) IMPLANT
SPONGE SURGIFOAM ABS GEL 100 (HEMOSTASIS) ×3 IMPLANT
STAPLER SKIN PROX WIDE 3.9 (STAPLE) IMPLANT
STRIP CLOSURE SKIN 1/2X4 (GAUZE/BANDAGES/DRESSINGS) ×1 IMPLANT
SUT VIC AB 1 CT1 18XBRD ANBCTR (SUTURE) ×2 IMPLANT
SUT VIC AB 1 CT1 8-18 (SUTURE) ×6
SUT VIC AB 2-0 CT1 18 (SUTURE) ×6 IMPLANT
SUT VIC AB 3-0 SH 8-18 (SUTURE) ×6 IMPLANT
SYR 20CC LL (SYRINGE) ×2 IMPLANT
SYR 3ML LL SCALE MARK (SYRINGE) ×4 IMPLANT
SYR 5ML LL (SYRINGE) IMPLANT
TOWEL OR 17X24 6PK STRL BLUE (TOWEL DISPOSABLE) ×3 IMPLANT
TOWEL OR 17X26 10 PK STRL BLUE (TOWEL DISPOSABLE) ×3 IMPLANT
TRAP SPECIMEN MUCOUS 40CC (MISCELLANEOUS) ×3 IMPLANT
TRAY FOLEY W/METER SILVER 16FR (SET/KITS/TRAYS/PACK) ×3 IMPLANT
WATER STERILE IRR 1000ML POUR (IV SOLUTION) ×3 IMPLANT

## 2015-11-14 NOTE — Progress Notes (Signed)
Pt arrived to 5C17 via stretcher. VSS.  Pain 7/10.  Will continue to monitor.  Cori Razor, RN

## 2015-11-14 NOTE — Interval H&P Note (Signed)
History and Physical Interval Note:  11/14/2015 7:42 AM  Kathryn Romero  has presented today for surgery, with the diagnosis of Spondylolisthesis, Lumbar region  The various methods of treatment have been discussed with the patient and family. After consideration of risks, benefits and other options for treatment, the patient has consented to  Procedure(s) with comments: L3-4 L4-5 Maximum access posterior lumbar interbody fusion (N/A) - L3-4 L4-5 Maximum access posterior lumbar interbody fusion as a surgical intervention .  The patient's history has been reviewed, patient examined, no change in status, stable for surgery.  I have reviewed the patient's chart and labs.  Questions were answered to the patient's satisfaction.     Eliazar Olivar D

## 2015-11-14 NOTE — Brief Op Note (Signed)
11/14/2015  2:52 PM  PATIENT:  Kathryn Romero  80 y.o. female  PRE-OPERATIVE DIAGNOSIS:  Spondylolisthesis L 34, L 45 levels with stenosis, spondylosis, radiculopathy  POST-OPERATIVE DIAGNOSIS:  Spondylolisthesis L 34, L 45 levels with stenosis, spondylosis, radiculopathy  PROCEDURE:  Procedure(s) with comments: Lumbar Four-Five Maximum access posterior lumbar interbody fusion, Lumbar Three-Four Lumbar Four-Five Posterolateral Fusion and Pedicle Screws (N/A) - L3-4 L4-5 Maximum access posterior lumbar interbody fusion with interbody cages L 45 with pedicle screws L 3, L 4, L 5 with posterolateral arthrodesis L 3 - L 5 levels  SURGEON:  Surgeon(s) and Role:    * Erline Levine, MD - Primary    * Eustace Moore, MD - Assisting  PHYSICIAN ASSISTANT:   ASSISTANTS: Poteat, RN   ANESTHESIA:   general  EBL:  Total I/O In: 1000 [I.V.:1000] Out: 1025 [Urine:775; Blood:250]  BLOOD ADMINISTERED:none  DRAINS: none   LOCAL MEDICATIONS USED:  MARCAINE    and LIDOCAINE   SPECIMEN:  No Specimen  DISPOSITION OF SPECIMEN:  N/A  COUNTS:  YES  TOURNIQUET:  * No tourniquets in log *  DICTATION: Patient is a 80 year old with spondylolisthesis , stenosis, disc herniation and severe back and bilateral lower extremity pain at L45 and L 34 levels of the lumbar spine.  It was elected to take her to surgery for MASPLIF at  L 45 and L 34  levels with posterolateral arthrodesis.  Procedure:   Following uncomplicated induction of GETA, and placement of electrodes for neural monitoring, patient was turned into a prone position on the [place] tableand using AP  fluoroscopy the area of planned incision was marked, prepped with betadine scrub and Duraprep, then draped. Exposure was performed of facet joint complex at L 34 and L 45 levels and the MAS retractor was placed.5.5 x 35 mm cortical Nuvasive screws were placed at L 3 bilaterally according to standard landmarks using neural monitoring.  A total  laminectomy of L 3 and L 4 was then performed with disarticulation of facets.  Decompression was greater than for standard PLIF procedure and thorough decompression of the thecal sac, bilateral L 3, L 4, L 5 nerve roots was performed. Along with foraminal and extraforaminal portions of these nerve roots.  This bone was saved for grafting, combined with allograft after being run through bone mill and was placed in bone packing device.  Thorough discectomy was performed bilaterally at L 45  and the endplates were prepared for grafting.  23 x 10 x 12 degree cages were placed in the interspace and positioning was confirmed with AP and lateral fluoroscopy.  10 cc of autograft was packed in the interspace medial to the second cage.   I elected to not place cages at the L 34 level as I felt that the neural elements were sufficiently decompressed and that I would need to disrupt the facet joint complex and was also concerned about the patient's bone quality, that I elected not to place interbody cages at this level.  Remaining screws were placed at L 4 and and L 5 and 55 mm rods were placed. And the screws were locked and torqued.Final Xrays showed well positioned implants and screw fixation. The posterolateral region on each side was packed with remaining 30 cc of autograft and allograft on each side. The wounds were irrigated and then closed with 1, 2-0 and 3-0 Vicryl stitches. 20 cc long-acting Marcaine was injected into the musculature.  Sterile occlusive dressing was placed with Dermabond and  an occlusive dressing. The patient was then extubated in the operating room and taken to recovery in stable and satisfactory condition having tolerated her operation well. Counts were correct at the end of the case.  PLAN OF CARE: Admit to inpatient   PATIENT DISPOSITION:  PACU - hemodynamically stable.   Delay start of Pharmacological VTE agent (>24hrs) due to surgical blood loss or risk of bleeding: yes

## 2015-11-14 NOTE — Op Note (Signed)
11/14/2015  2:52 PM  PATIENT:  Kathryn Romero  80 y.o. female  PRE-OPERATIVE DIAGNOSIS:  Spondylolisthesis L 34, L 45 levels with stenosis, spondylosis, radiculopathy  POST-OPERATIVE DIAGNOSIS:  Spondylolisthesis L 34, L 45 levels with stenosis, spondylosis, radiculopathy  PROCEDURE:  Procedure(s) with comments: Lumbar Four-Five Maximum access posterior lumbar interbody fusion, Lumbar Three-Four Lumbar Four-Five Posterolateral Fusion and Pedicle Screws (N/A) - L3-4 L4-5 Maximum access posterior lumbar interbody fusion with interbody cages L 45 with pedicle screws L 3, L 4, L 5 with posterolateral arthrodesis L 3 - L 5 levels  SURGEON:  Surgeon(s) and Role:    * Erline Levine, MD - Primary    * Eustace Moore, MD - Assisting  PHYSICIAN ASSISTANT:   ASSISTANTS: Poteat, RN   ANESTHESIA:   general  EBL:  Total I/O In: 1000 [I.V.:1000] Out: 1025 [Urine:775; Blood:250]  BLOOD ADMINISTERED:none  DRAINS: none   LOCAL MEDICATIONS USED:  MARCAINE    and LIDOCAINE   SPECIMEN:  No Specimen  DISPOSITION OF SPECIMEN:  N/A  COUNTS:  YES  TOURNIQUET:  * No tourniquets in log *  DICTATION: Patient is a 80 year old with spondylolisthesis , stenosis, disc herniation and severe back and bilateral lower extremity pain at L45 and L 34 levels of the lumbar spine.  It was elected to take her to surgery for MASPLIF at  L 45 and L 34  levels with posterolateral arthrodesis.  Procedure:   Following uncomplicated induction of GETA, and placement of electrodes for neural monitoring, patient was turned into a prone position on the [place] tableand using AP  fluoroscopy the area of planned incision was marked, prepped with betadine scrub and Duraprep, then draped. Exposure was performed of facet joint complex at L 34 and L 45 levels and the MAS retractor was placed.5.5 x 35 mm cortical Nuvasive screws were placed at L 3 bilaterally according to standard landmarks using neural monitoring.  A total  laminectomy of L 3 and L 4 was then performed with disarticulation of facets.  Decompression was greater than for standard PLIF procedure and thorough decompression of the thecal sac, bilateral L 3, L 4, L 5 nerve roots was performed. Along with foraminal and extraforaminal portions of these nerve roots.  This bone was saved for grafting, combined with allograft after being run through bone mill and was placed in bone packing device.  Thorough discectomy was performed bilaterally at L 45  and the endplates were prepared for grafting.  23 x 10 x 12 degree cages were placed in the interspace and positioning was confirmed with AP and lateral fluoroscopy.  10 cc of autograft was packed in the interspace medial to the second cage.   I elected to not place cages at the L 34 level as I felt that the neural elements were sufficiently decompressed and that I would need to disrupt the facet joint complex and was also concerned about the patient's bone quality, that I elected not to place interbody cages at this level.  Remaining screws were placed at L 4 and and L 5 and 55 mm rods were placed. And the screws were locked and torqued.Final Xrays showed well positioned implants and screw fixation. The posterolateral region on each side was packed with remaining 30 cc of autograft and allograft on each side. The wounds were irrigated and then closed with 1, 2-0 and 3-0 Vicryl stitches. 20 cc long-acting Marcaine was injected into the musculature.  Sterile occlusive dressing was placed with Dermabond and  an occlusive dressing. The patient was then extubated in the operating room and taken to recovery in stable and satisfactory condition having tolerated her operation well. Counts were correct at the end of the case.  PLAN OF CARE: Admit to inpatient   PATIENT DISPOSITION:  PACU - hemodynamically stable.   Delay start of Pharmacological VTE agent (>24hrs) due to surgical blood loss or risk of bleeding: yes

## 2015-11-14 NOTE — Anesthesia Preprocedure Evaluation (Signed)
Anesthesia Evaluation  Patient identified by MRN, date of birth, ID band Patient awake    Reviewed: Allergy & Precautions, NPO status , Patient's Chart, lab work & pertinent test results  Airway Mallampati: II  TM Distance: >3 FB Neck ROM: Full    Dental no notable dental hx. (+) Upper Dentures, Partial Lower   Pulmonary neg pulmonary ROS,    Pulmonary exam normal breath sounds clear to auscultation       Cardiovascular negative cardio ROS Normal cardiovascular exam Rhythm:Regular Rate:Normal     Neuro/Psych Retinitis pigmentosa  Legally blind negative neurological ROS  negative psych ROS   GI/Hepatic negative GI ROS, Neg liver ROS,   Endo/Other  negative endocrine ROS  Renal/GU negative Renal ROS  negative genitourinary   Musculoskeletal negative musculoskeletal ROS (+)   Abdominal   Peds negative pediatric ROS (+)  Hematology negative hematology ROS (+)   Anesthesia Other Findings   Reproductive/Obstetrics negative OB ROS                             Anesthesia Physical Anesthesia Plan  ASA: II  Anesthesia Plan: General   Post-op Pain Management:    Induction: Intravenous  Airway Management Planned: Oral ETT  Additional Equipment:   Intra-op Plan:   Post-operative Plan: Extubation in OR  Informed Consent: I have reviewed the patients History and Physical, chart, labs and discussed the procedure including the risks, benefits and alternatives for the proposed anesthesia with the patient or authorized representative who has indicated his/her understanding and acceptance.   Dental advisory given  Plan Discussed with: CRNA  Anesthesia Plan Comments:         Anesthesia Quick Evaluation

## 2015-11-14 NOTE — Transfer of Care (Signed)
Immediate Anesthesia Transfer of Care Note  Patient: Kathryn Romero  Procedure(s) Performed: Procedure(s) with comments: Lumbar Four-Five Maximum access posterior lumbar interbody fusion, Lumbar Three-Four Lumbar Four-Five Posterolateral Fusion and Pedicle Screws (N/A) - L3-4 L4-5 Maximum access posterior lumbar interbody fusion  Patient Location: PACU  Anesthesia Type:General  Level of Consciousness: awake, alert , oriented and patient cooperative  Airway & Oxygen Therapy: Patient Spontanous Breathing and Patient connected to face mask oxygen  Post-op Assessment: Report given to RN, Post -op Vital signs reviewed and stable and Patient moving all extremities X 4  Post vital signs: Reviewed and stable  Last Vitals:  Filed Vitals:   11/14/15 0729 11/14/15 1508  BP: 141/80   Pulse: 69   Temp: 36.7 C 36.5 C  Resp: 20 18    Last Pain:  Filed Vitals:   11/14/15 1510  PainSc: 1       Patients Stated Pain Goal: 3 ([id number])  Complications: No apparent anesthesia complications

## 2015-11-14 NOTE — Progress Notes (Signed)
Sleepy, but arouses.  MAEW to command.  Doing well.

## 2015-11-14 NOTE — Anesthesia Procedure Notes (Signed)
Procedure Name: Intubation Date/Time: 11/14/2015 11:25 AM Performed by: Layla Maw Pre-anesthesia Checklist: Patient identified, Patient being monitored, Timeout performed, Emergency Drugs available and Suction available Patient Re-evaluated:Patient Re-evaluated prior to inductionOxygen Delivery Method: Circle System Utilized Preoxygenation: Pre-oxygenation with 100% oxygen Intubation Type: IV induction Ventilation: Mask ventilation without difficulty Laryngoscope Size: Miller and 3 Grade View: Grade I Tube type: Oral Tube size: 7.0 mm Number of attempts: 1 Airway Equipment and Method: Stylet Placement Confirmation: ETT inserted through vocal cords under direct vision,  positive ETCO2 and breath sounds checked- equal and bilateral Secured at: 21 cm Tube secured with: Tape Dental Injury: Teeth and Oropharynx as per pre-operative assessment

## 2015-11-15 MED ORDER — DM-GUAIFENESIN ER 30-600 MG PO TB12
1.0000 | ORAL_TABLET | Freq: Two times a day (BID) | ORAL | Status: DC
  Administered 2015-11-15 – 2015-11-18 (×7): 1 via ORAL
  Filled 2015-11-15 (×7): qty 1

## 2015-11-15 NOTE — Progress Notes (Signed)
Physical Therapy Evaluation Patient Details Name: Kathryn Romero MRN: [id number] DOB: [birth year] Today's Date: 11/15/2015   History of Present Illness  80 y.o. female Spondylolisthesis L 80, L 45 levels with stenosis, spondylosis, radiculopathy s/p L3-5 PLIF. Pt is legally blind.  Clinical Impression  Patient seen with OT co-treatment per nursing report of low tolerance, pain and nausea.  Patient requires increased cues for orientation to environment due to low vision, but good effort.  MIN / MOD assist at this time for mobility, room level mobility on evaluation.  Patient reports plan is to move to SNF for rehab.  Patient is good rehab candidate and will remain on PT roster while in hospital.  See details below.      Follow Up Recommendations SNF    Equipment Recommendations  None recommended by PT    Recommendations for Other Services       Precautions / Restrictions Precautions Precautions: Back;Fall (legally blind) Precaution Booklet Issued: Yes (comment) Required Braces or Orthoses: Spinal Brace Spinal Brace: Lumbar corset;Applied in sitting position Restrictions Weight Bearing Restrictions: No      Mobility  Bed Mobility Overal bed mobility: Needs Assistance Bed Mobility: Rolling;Supine to Sit Rolling: Min assist   Supine to sit: Mod assist     General bed mobility comments: Cues for log roll technique  Transfers Overall transfer level: Needs assistance Equipment used: Rolling walker (2 wheeled) Transfers: Sit to/from Stand Sit to Stand: Min assist         General transfer comment: Cues for technique and hand placement  Ambulation/Gait Ambulation/Gait assistance: Min guard Ambulation Distance (Feet): 15 Feet Assistive device: Rolling walker (2 wheeled) Gait Pattern/deviations: Step-to pattern;Antalgic     General Gait Details: slow pace  Stairs            Wheelchair Mobility    Modified Rankin (Stroke Patients Only)       Balance  Overall balance assessment: Needs assistance   Sitting balance-Leahy Scale: Good     Standing balance support: Bilateral upper extremity supported Standing balance-Leahy Scale: Poor                               Pertinent Vitals/Pain Pain Assessment: 0-10 Pain Score: 8  Pain Location: Back, surgical site Pain Descriptors / Indicators: Aching;Sore Pain Intervention(s): Limited activity within patient's tolerance;Monitored during session    Home Living Family/patient expects to be discharged to:: Skilled nursing facility Living Arrangements: Alone                    Prior Function Level of Independence: Independent               Hand Dominance        Extremity/Trunk Assessment   Upper Extremity Assessment: Defer to OT evaluation;Overall WFL for tasks assessed           Lower Extremity Assessment: Overall WFL for tasks assessed         Communication   Communication: No difficulties  Cognition Arousal/Alertness: Awake/alert Behavior During Therapy: WFL for tasks assessed/performed Overall Cognitive Status: Within Functional Limits for tasks assessed                      General Comments General comments (skin integrity, edema, etc.): Legally blind, back brace education, pain and nausea feeling limitation.    Exercises        Assessment/Plan    PT Assessment Patient  needs continued PT services  PT Diagnosis Difficulty walking;Acute pain;Generalized weakness   PT Problem List Decreased strength;Decreased activity tolerance;Decreased mobility;Decreased knowledge of use of DME;Pain  PT Treatment Interventions DME instruction;Gait training;Functional mobility training;Therapeutic activities;Therapeutic exercise;Patient/family education   PT Goals (Current goals can be found in the Care Plan section) Acute Rehab PT Goals Patient Stated Goal: To go to rehab place PT Goal Formulation: With patient Time For Goal Achievement:  11/29/15 Potential to Achieve Goals: Good    Frequency Min 5X/week   Barriers to discharge Decreased caregiver support      Co-evaluation PT/OT/SLP Co-Evaluation/Treatment: Yes Reason for Co-Treatment: Complexity of the patient's impairments (multi-system involvement) (Ease of patient tolerance per nursing report.) PT goals addressed during session: Mobility/safety with mobility;Proper use of DME OT goals addressed during session: ADL's and self-care;Proper use of Adaptive equipment and DME       End of Session Equipment Utilized During Treatment: Gait belt;Back brace Activity Tolerance: Patient tolerated treatment well Patient left: in chair;with call bell/phone within reach;with chair alarm set Nurse Communication: Mobility status;Precautions         Time: [id number] PT Time Calculation (min) (ACUTE ONLY): 33 min   Charges:   PT Evaluation $PT Eval Low Complexity: 1 Procedure PT Treatments $Therapeutic Activity: 8-22 mins   PT G Codes:        Averiana Clouatre L 11-Dec-2015, 3:10 PM

## 2015-11-15 NOTE — Progress Notes (Signed)
Patient ID: Kathryn Romero, female   DOB: 04/04/1936, 80 y.o.   MRN: [id number] C/o nause, back pain, no weakness. Wound dry.

## 2015-11-15 NOTE — Progress Notes (Signed)
Occupational Therapy Evaluation Patient Details Name: Kathryn SURRENCY MRN: [id number] DOB: 13-Aug-1935 Today's Date: 11/15/2015    History of Present Illness 80 y.o. female s/p L3-5 PLIF. Pt is legally blind.   Clinical Impression   PTA, pt was independent with ADLs and mobility. Pt currently requires mod assist for LB ADLs and min assist for basic transfers due to acute back pain and low vision. Pt requires clear verbal and tactile cues for orientatio to environment due to low vision and will need consistent cues to adhere to back precautions during ADL tasks. Pt plans to d/c to SNF for post-acute rehab stay due to lack of social support at home - I agree that this is an appropriate discharge disposition for this pt. Will continue to follow acutely.    Follow Up Recommendations  SNF;Supervision/Assistance - 24 hour    Equipment Recommendations  Other (comment) (TBD at next venue)    Recommendations for Other Services       Precautions / Restrictions Precautions Precautions: Back;Fall;Other (comment) (legally blind) Precaution Booklet Issued: Yes (comment) Precaution Comments: Pt will need consistent verbal teaching and reminders due to blindness Required Braces or Orthoses: Spinal Brace Spinal Brace: Lumbar corset;Applied in sitting position Restrictions Weight Bearing Restrictions: No      Mobility Bed Mobility Overal bed mobility: Needs Assistance Bed Mobility: Rolling;Sidelying to Sit Rolling: Min assist Sidelying to sit: Mod assist Supine to sit: Mod assist     General bed mobility comments: Assist to move BLE off bed and to for trunk support to come to sitting position. VCs for log roll technique and tactile cues for hand placement due to low vision.  Transfers Overall transfer level: Needs assistance Equipment used: Rolling walker (2 wheeled) Transfers: Sit to/from Stand Sit to Stand: Min assist         General transfer comment: Min assist for boost to  stand and to stabilize balance upon standing. Verabl directional cues for low vision and VCs for adherence to back precautions.    Balance Overall balance assessment: Needs assistance Sitting-balance support: No upper extremity supported;Feet supported Sitting balance-Leahy Scale: Fair     Standing balance support: Bilateral upper extremity supported;During functional activity Standing balance-Leahy Scale: Poor Standing balance comment: reliant on UE support for balance                            ADL Overall ADL's : Needs assistance/impaired     Grooming: Wash/dry hands;Set up;Sitting   Upper Body Bathing: Minimal assitance;Sitting   Lower Body Bathing: Moderate assistance;Sit to/from stand;Cueing for compensatory techniques   Upper Body Dressing : Moderate assistance;Sitting;Cueing for sequencing Upper Body Dressing Details (indicate cue type and reason): to don/doff back brace, will need consistent VCs due to low vision Lower Body Dressing: Moderate assistance;Sit to/from stand;Cueing for compensatory techniques   Toilet Transfer: Minimal assistance;Cueing for safety;Ambulation;BSC;RW   Toileting- Clothing Manipulation and Hygiene: Minimal assistance;Sit to/from stand;Cueing for compensatory techniques       Functional mobility during ADLs: Minimal assistance;Rolling walker General ADL Comments: Pt requires simple directional cues (R/L) due to legal blindness and tactile cues to find arm rests and objects in front of her.     Vision Vision Assessment?: Vision impaired- to be further tested in functional context Additional Comments: Able to see shapes of objects, but not details   Perception     Praxis      Pertinent Vitals/Pain Pain Assessment: 0-10 Pain Score: 8  Pain Location: incision site Pain Descriptors / Indicators: Aching;Sore Pain Intervention(s): Monitored during session;Limited activity within patient's tolerance;Premedicated before  session;Repositioned     Hand Dominance Right   Extremity/Trunk Assessment Upper Extremity Assessment Upper Extremity Assessment: Overall WFL for tasks assessed   Lower Extremity Assessment Lower Extremity Assessment: Defer to PT evaluation   Cervical / Trunk Assessment Cervical / Trunk Assessment: Other exceptions Cervical / Trunk Exceptions: s/p lumbar surgery   Communication Communication Communication: No difficulties   Cognition Arousal/Alertness: Awake/alert Behavior During Therapy: WFL for tasks assessed/performed Overall Cognitive Status: Within Functional Limits for tasks assessed                     General Comments       Exercises       Shoulder Instructions      Home Living Family/patient expects to be discharged to:: Skilled nursing facility Living Arrangements: Alone                               Additional Comments: Has RW and handicap height toilets at home.      Prior Functioning/Environment Level of Independence: Independent             OT Diagnosis: Acute pain;Other (comment) (Low vision)   OT Problem List: Decreased activity tolerance;Impaired balance (sitting and/or standing);Decreased safety awareness;Decreased knowledge of use of DME or AE;Decreased knowledge of precautions;Obesity;Pain;Impaired vision/perception   OT Treatment/Interventions: Self-care/ADL training;Therapeutic exercise;Energy conservation;DME and/or AE instruction;Therapeutic activities;Visual/perceptual remediation/compensation;Patient/family education;Balance training    OT Goals(Current goals can be found in the care plan section) Acute Rehab OT Goals Patient Stated Goal: To go to rehab place OT Goal Formulation: With patient Time For Goal Achievement: 11/29/15 Potential to Achieve Goals: Good ADL Goals Pt Will Perform Grooming: with supervision;standing Pt Will Perform Lower Body Bathing: with supervision;with adaptive equipment;sit to/from  stand Pt Will Perform Lower Body Dressing: with supervision;with adaptive equipment;sit to/from stand Pt Will Transfer to Toilet: with supervision;ambulating;bedside commode (over toilet) Pt Will Perform Toileting - Clothing Manipulation and hygiene: with supervision;sit to/from stand Additional ADL Goal #1: Pt will independently don/doff back brace with no verbal cues. Additional ADL Goal #2: Pt will demonstrate adherence to 3/3 back precautions with min verbal cues.  OT Frequency: Min 2X/week   Barriers to D/C: Decreased caregiver support  Lives alone       Co-evaluation PT/OT/SLP Co-Evaluation/Treatment: Yes Reason for Co-Treatment: For patient/therapist safety PT goals addressed during session: Mobility/safety with mobility;Proper use of DME OT goals addressed during session: ADL's and self-care;Proper use of Adaptive equipment and DME      End of Session Equipment Utilized During Treatment: Gait belt;Rolling walker;Back brace Nurse Communication: Mobility status  Activity Tolerance: Other (comment) (Patient limited by nausea) Patient left: in chair;with call bell/phone within reach;with chair alarm set   Time: 1326-1356 OT Time Calculation (min): 30 min Charges:  OT General Charges $OT Visit: 1 Procedure OT Evaluation $OT Eval Moderate Complexity: 1 Procedure G-Codes:    Redmond Baseman, OTR/L [phone]  11/15/2015, 3:23 PM

## 2015-11-15 NOTE — Progress Notes (Signed)
Pt c/o could not take ordered percocet, said to cause nausea, Dr Ronnald Ramp paged, ordered tab Aleve (Naproxen) as requested by patient, same given at 2300, pt re-positioned and made comfortable in bed, will however continue to monitor. [place]-Asidi, Uvaldo Rybacki Efe

## 2015-11-16 NOTE — Progress Notes (Signed)
Physical Therapy Treatment Patient Details Name: RAIDYN VILLADA MRN: [id number] DOB: [birth year] Today's Date: 11/16/2015    History of Present Illness 80 y.o. female s/p L3-5 PLIF. Pt is legally blind.    PT Comments    Pt performed increased gait, reports decreased pain and appears more motivated.  Pt requiring total assist to donn LSO corset and re-education to recall and maintain spinal precautions.    Follow Up Recommendations  SNF     Equipment Recommendations  None recommended by PT    Recommendations for Other Services       Precautions / Restrictions Precautions Precautions: Back;Fall;Other (comment) (legally blind.  ) Precaution Comments: Pt will need consistent verbal teaching and reminders due to blindness Required Braces or Orthoses: Spinal Brace Spinal Brace: Lumbar corset;Applied in sitting position (required total assistance.  ) Restrictions Weight Bearing Restrictions: No    Mobility  Bed Mobility Overal bed mobility: Needs Assistance   Rolling:  (Pt in sidelying on arrival.  ) Sidelying to sit: Mod assist (cues for sequencing and spinal precautions to avoid twisting during supine to sit.  )       General bed mobility comments: Pt remains to require assist to move LEs over to edge of bed and elevate trunk from sidelying position.    Transfers Overall transfer level: Needs assistance Equipment used: Rolling walker (2 wheeled) Transfers: Sit to/from Stand Sit to Stand: Min assist         General transfer comment: Pt remains to require min assist for boosting and hand placement during sit to stand.    Ambulation/Gait Ambulation/Gait assistance: Min guard;Min assist Ambulation Distance (Feet): 75 Feet Assistive device: Rolling walker (2 wheeled) Gait Pattern/deviations: Antalgic;Step-through pattern;Scissoring;Decreased stride length   Gait velocity interpretation: Below normal speed for age/gender General Gait Details: slow pace, pt  required cues for RW safety, for turns, for backing and upper trunk control.    Stairs            Wheelchair Mobility    Modified Rankin (Stroke Patients Only)       Balance Overall balance assessment: Needs assistance   Sitting balance-Leahy Scale: Fair       Standing balance-Leahy Scale: Poor                      Cognition Arousal/Alertness: Awake/alert Behavior During Therapy: WFL for tasks assessed/performed Overall Cognitive Status: Within Functional Limits for tasks assessed                      Exercises      General Comments        Pertinent Vitals/Pain Pain Assessment: 0-10 Pain Score: 4  Pain Location: back Pain Descriptors / Indicators: Discomfort Pain Intervention(s): Monitored during session;Repositioned    Home Living                      Prior Function            PT Goals (current goals can now be found in the care plan section) Acute Rehab PT Goals Patient Stated Goal: To go to rehab place Potential to Achieve Goals: Good Progress towards PT goals: Progressing toward goals    Frequency  Min 5X/week    PT Plan Current plan remains appropriate    Co-evaluation             End of Session Equipment Utilized During Treatment: Gait belt;Back brace Activity Tolerance: Patient tolerated treatment  well Patient left: in chair;with call bell/phone within reach;with chair alarm set     Time: [phone] PT Time Calculation (min) (ACUTE ONLY): 22 min  Charges:  $Gait Training: 8-22 mins                    G Codes:      Cristela Blue 11-30-2015, 1:12 PM  Governor Rooks, PTA pager [phone]

## 2015-11-16 NOTE — NC FL2 (Signed)
[place] MEDICAID FL2 LEVEL OF CARE SCREENING TOOL     IDENTIFICATION  Patient Name: Kathryn Romero Birthdate: [birth year] Sex: female Admission Date (Current Location): 11/14/2015  [facility] and [place] Number:  Herbalist and Address:  [facility] [place] [address], [place], [place] [address]      Provider Number: [id number]  Attending Physician Name and Address:  Erline Levine, MD  Relative Name and Phone Number:       Current Level of Care: Hospital Recommended Level of Care: [place] Prior Approval Number:    Date Approved/Denied:   PASRR Number: [id number]  Discharge Plan: SNF    Current Diagnoses: Patient Active Problem List   Diagnosis Date Noted  . Spondylolisthesis at L4-L5 level 11/14/2015  . DOE (dyspnea on exertion) 10/14/2014  . Fatigue 09/19/2014  . Obese 05/20/2014  . RETINITIS PIGMENTOSA 12/04/2008  . GLUCOSE INTOLERANCE 05/09/2007  . Dyslipidemia 05/09/2007  . ANEMIA-NOS 05/09/2007  . ANXIETY 05/09/2007  . Essential hypertension 05/09/2007  . DIVERTICULOSIS, COLON 05/09/2007  . BACK PAIN 05/09/2007  . History of malignant neoplasm of large intestine 05/09/2007  . ARTHRITIS, TRAUMATIC, UNSPECIFIED SITE 12/17/2006    Orientation RESPIRATION BLADDER Height & Weight     Self, Time, Situation, Place  Normal Continent Weight: 84 kg (185 lb 3 oz) Height:  5\' 1"  (154.9 cm)  BEHAVIORAL SYMPTOMS/MOOD NEUROLOGICAL BOWEL NUTRITION STATUS   (NONE)  (NONE) Continent Diet (Currently clear liquid)  AMBULATORY STATUS COMMUNICATION OF NEEDS Skin   Extensive Assist Verbally Surgical wounds (Incision of back)                       Personal Care Assistance Level of Assistance  Bathing, Feeding, Dressing Bathing Assistance: Limited assistance Feeding assistance: Independent Dressing Assistance: Limited assistance     Functional Limitations Info  Sight, Hearing, Speech Sight Info: Impaired Hearing  Info: Adequate Speech Info: Adequate    SPECIAL CARE FACTORS FREQUENCY  PT (By licensed PT), OT (By licensed OT)     PT Frequency: 5/week OT Frequency: 5/week            Contractures Contractures Info: Not present    Additional Factors Info  Code Status, Allergies Code Status Info: Full Allergies Info: Codeine, Hydromet, Fish Allergy, Morphine, Sulfa Antibiotics           Current Medications (11/16/2015):  This is the current hospital active medication list Current Facility-Administered Medications  Medication Dose Route Frequency Provider Last Rate Last Dose  . 0.9 %  sodium chloride infusion  250 mL Intravenous Continuous Erline Levine, MD   250 mL at 11/14/15 1812  . acetaminophen (TYLENOL) tablet 650 mg  650 mg Oral Q4H PRN Erline Levine, MD       Or  . acetaminophen (TYLENOL) suppository 650 mg  650 mg Rectal Q4H PRN Erline Levine, MD      . alum & mag hydroxide-simeth (MAALOX/MYLANTA) 200-200-20 MG/5ML suspension 30 mL  30 mL Oral Q6H PRN Erline Levine, MD      . bisacodyl (DULCOLAX) suppository 10 mg  10 mg Rectal Daily PRN Erline Levine, MD      . dextromethorphan-guaiFENesin [facility] DM) 30-600 MG per 12 hr tablet 1 tablet  1 tablet Oral BID Leeroy Cha, MD   1 tablet at 11/15/15 2121  . dextrose 5 % and 0.45 % NaCl with KCl 20 mEq/L infusion   Intravenous Continuous Erline Levine, MD 75 mL/hr at 11/15/15 0726    .  docusate sodium (COLACE) capsule 100 mg  100 mg Oral BID Erline Levine, MD   100 mg at 11/15/15 2118  . famotidine (PEPCID) IVPB 20 mg premix  20 mg Intravenous Q12H Erline Levine, MD   20 mg at 11/15/15 2121  . HYDROmorphone (DILAUDID) injection 0.5-1 mg  0.5-1 mg Intravenous Q2H PRN Erline Levine, MD   1 mg at 11/16/15 0835  . menthol-cetylpyridinium (CEPACOL) lozenge 3 mg  1 lozenge Oral PRN Erline Levine, MD       Or  . phenol (CHLORASEPTIC) mouth spray 1 spray  1 spray Mouth/Throat PRN Erline Levine, MD      . methocarbamol (ROBAXIN) tablet 500 mg  500 mg  Oral Q6H PRN Erline Levine, MD   500 mg at 11/16/15 0740   Or  . methocarbamol (ROBAXIN) 500 mg in dextrose 5 % 50 mL IVPB  500 mg Intravenous Q6H PRN Erline Levine, MD      . naproxen (NAPROSYN) tablet 250 mg  250 mg Oral BID BM PRN Eustace Moore, MD   250 mg at 11/16/15 0740  . ondansetron (ZOFRAN) injection 4 mg  4 mg Intravenous Q4H PRN Erline Levine, MD   4 mg at 11/15/15 0630  . oxyCODONE-acetaminophen (PERCOCET/ROXICET) 5-325 MG per tablet 1-2 tablet  1-2 tablet Oral Q4H PRN Erline Levine, MD   2 tablet at 11/14/15 1519  . polyethylene glycol (MIRALAX / GLYCOLAX) packet 17 g  17 g Oral Daily PRN Erline Levine, MD      . pravastatin (PRAVACHOL) tablet 20 mg  20 mg Oral q1800 Erline Levine, MD   20 mg at 11/15/15 1740  . sodium chloride flush (NS) 0.9 % injection 3 mL  3 mL Intravenous Q12H Erline Levine, MD   3 mL at 11/15/15 2200  . sodium chloride flush (NS) 0.9 % injection 3 mL  3 mL Intravenous PRN Erline Levine, MD      . sodium phosphate (FLEET) 7-19 GM/118ML enema 1 enema  1 enema Rectal Once PRN Erline Levine, MD      . zolpidem Lorrin Mais) tablet 5 mg  5 mg Oral QHS PRN Erline Levine, MD   5 mg at 11/15/15 2117     Discharge Medications: Please see discharge summary for a list of discharge medications.  Relevant Imaging Results:  Relevant Lab Results:   Additional Information SSN: [id number]  Rigoberto Noel, LCSW

## 2015-11-16 NOTE — Clinical Social Work Note (Signed)
Clinical Social Work Assessment  Patient Details  Name: Kathryn Romero MRN: [id number] Date of Birth: Aug 08, 1935  Date of referral:  11/16/15               Reason for consult:  Facility Placement, Discharge Planning                Permission sought to share information with:  Family Supports, Customer service manager, Case Optician, dispensing granted to share information::  Yes, Verbal Permission Granted  Name::      Kathryn Romero)  Agency::   (SNF's )  Relationship::   (Brother )  Contact Information:   [phone])  Housing/Transportation Living arrangements for the past 2 months:  Apartment Source of Information:  Patient Patient Interpreter Needed:  None Criminal Activity/Legal Involvement Pertinent to Current Situation/Hospitalization:  No - Comment as needed Significant Relationships:  Siblings Lives with:  Self Do you feel safe going back to the place where you live?  No Need for family participation in patient care:  Yes (Comment)  Care giving concerns: PT recommending SNF placement for ST rehab. Per patient MD recommending [facility].    Social Worker assessment / plan: [place] representative met with patient at bedside in reference to post acute placement for SNF. CSW introduced CSW role and SNF process. SNF list reviewed and provided at bedside. Patient is legally blind and reported her brother will also review list. Patient prefers [facility] for [place] rehab placement. CSW encouraged patient explore other options with brother in the even [place] is not available to her. Patient expressed understanding. No further concerns reported at this time. CSW will continue to follow pt and pt's family for continued support and to facilitate d/c needs once medically stable.   Employment status:  Retired Nurse, adult PT Recommendations:  [place] / Referral to community  resources:  [place]  Patient/Family's Response to care:  Pt a/o x4. Pt agreeable to SNF and prefers [facility]. Pt pleasant and appreciated social work intervention.   Patient/Family's Understanding of and Emotional Response to Diagnosis, Current Treatment, and Prognosis:  Patient knowledgeable of surgical procedure and medical interventions.   Emotional Assessment Appearance:  Appears younger than stated age Attitude/Demeanor/Rapport:   (Polite ) Affect (typically observed):  Accepting, Appropriate, Calm, Pleasant Orientation:  Oriented to Situation, Oriented to  Time, Oriented to Place, Oriented to Self Alcohol / Substance use:  Not Applicable Psych involvement (Current and /or in the community):  No (Comment)  Discharge Needs  Concerns to be addressed:  Care Coordination, Basic Needs Readmission within the last 30 days:  No Current discharge risk:  Dependent with Mobility Barriers to Discharge:  Continued Medical Work up   [employer], MSW, LCSWA [phone] 11/16/2015 2:44 PM

## 2015-11-16 NOTE — Clinical Social Work Placement (Signed)
   CLINICAL SOCIAL WORK PLACEMENT  NOTE  Date:  11/16/2015  Patient Details  Name: Kathryn Romero MRN: [id number] Date of Birth: April 22, 1936  Clinical Social Work is seeking post-discharge placement for this patient at the [place] level of care (*CSW will initial, date and re-position this form in  chart as items are completed):  Yes   Patient/family provided with [facility] Work Department's list of facilities offering this level of care within the geographic area requested by the patient (or if unable, by the patient's family).  Yes   Patient/family informed of their freedom to choose among providers that offer the needed level of care, that participate in Medicare, Medicaid or managed care program needed by the patient, have an available bed and are willing to accept the patient.  Yes   Patient/family informed of [facility]'s ownership interest in [facility] and [facility], as well as of the fact that they are under no obligation to receive care at these facilities.  PASRR submitted to EDS on 11/16/15     PASRR number received on 11/16/15     Existing PASRR number confirmed on       FL2 transmitted to all facilities in geographic area requested by pt/family on 11/16/15     FL2 transmitted to all facilities within larger geographic area on       Patient informed that his/her managed care company has contracts with or will negotiate with certain facilities, including the following:            Patient/family informed of bed offers received.  Patient chooses bed at       Physician recommends and patient chooses bed at      Patient to be transferred to   on  .  Patient to be transferred to facility by       Patient family notified on   of transfer.  Name of family member notified:        PHYSICIAN Please sign FL2     Additional Comment:    _______________________________________________ Rozell Searing, LCSW 11/16/2015,  2:36 PM

## 2015-11-16 NOTE — Progress Notes (Signed)
Patient ID: Kathryn Romero, female   DOB: May 30, 1935, 80 y.o.   MRN: [id number] Subjective: Patient reports considerable back pain. Still has leg pain. No new numbness tingling or weakness.  Objective: Vital signs in last 24 hours: Temp:  [98 F (36.7 C)-99.3 F (37.4 C)] 98.7 F (37.1 C) (07/09 0153) Pulse Rate:  [81-95] 84 (07/09 0153) Resp:  [18] 18 (07/09 0153) BP: (115-139)/(51-68) 139/60 mmHg (07/09 0153) SpO2:  [93 %-96 %] 94 % (07/09 0153)  Intake/Output from previous day:   Intake/Output this shift:    Neurologic: Grossly normal except for vision  Lab Results: Lab Results  Component Value Date   WBC 4.2 11/06/2015   HGB 13.5 11/06/2015   HCT 40.9 11/06/2015   MCV 90.1 11/06/2015   PLT 173 11/06/2015   No results found for: INR, PROTIME BMET Lab Results  Component Value Date   NA 140 11/06/2015   K 4.2 11/06/2015   CL 110 11/06/2015   CO2 23 11/06/2015   GLUCOSE 104* 11/06/2015   BUN 25* 11/06/2015   CREATININE 0.73 11/06/2015   CALCIUM 9.8 11/06/2015    Studies/Results: Dg Lumbar Spine 2-3 Views  11/14/2015  CLINICAL DATA:  L3-4, L4-5 posterior fusion EXAM: DG C-ARM 61-120 MIN; LUMBAR SPINE - 2-3 VIEW COMPARISON:  10/07/2015 FINDINGS: Two intraoperative spot images demonstrate changes of posterior fusion with posterior pedicle screws from L3 through L5. Normal alignment. No visible hardware complicating feature. IMPRESSION: Posterior fusion L3-L5. Electronically Signed   By: Rolm Baptise M.D.   On: 11/14/2015 14:40   Dg C-arm 1-60 Min  11/14/2015  CLINICAL DATA:  L3-4, L4-5 posterior fusion EXAM: DG C-ARM 61-120 MIN; LUMBAR SPINE - 2-3 VIEW COMPARISON:  10/07/2015 FINDINGS: Two intraoperative spot images demonstrate changes of posterior fusion with posterior pedicle screws from L3 through L5. Normal alignment. No visible hardware complicating feature. IMPRESSION: Posterior fusion L3-L5. Electronically Signed   By: Rolm Baptise M.D.   On: 11/14/2015 14:40     Assessment/Plan: We'll try to continue to manage her discomfort. Mobilize as tolerated.   LOS: 2 days    JONES,DAVID S 11/16/2015, 8:51 AM

## 2015-11-17 ENCOUNTER — Encounter (HOSPITAL_COMMUNITY): Payer: Self-pay | Admitting: Neurosurgery

## 2015-11-17 LAB — GLUCOSE, CAPILLARY: Glucose-Capillary: 129 mg/dL — ABNORMAL HIGH (ref 65–99)

## 2015-11-17 MED ORDER — FAMOTIDINE 20 MG PO TABS
20.0000 mg | ORAL_TABLET | Freq: Two times a day (BID) | ORAL | Status: DC
  Administered 2015-11-17 – 2015-11-18 (×3): 20 mg via ORAL
  Filled 2015-11-17 (×3): qty 1

## 2015-11-17 MED FILL — Heparin Sodium (Porcine) Inj 1000 Unit/ML: INTRAMUSCULAR | Qty: 30 | Status: AC

## 2015-11-17 MED FILL — Sodium Chloride IV Soln 0.9%: INTRAVENOUS | Qty: 1000 | Status: AC

## 2015-11-17 NOTE — Progress Notes (Signed)
Physical Therapy Treatment Patient Details Name: Kathryn Romero MRN: [id number] DOB: [birth year] Today's Date: 11/17/2015    History of Present Illness 80 y.o. female s/p L3-5 PLIF. Pt is legally blind.    PT Comments    Pt presents s/p lumbar sx. Back precautions were reviewed with pt at start of treatment and she was able to recall them at end of session. She was motivated to walk farther today and was able to ambulate 100 ft with cueing for navigation due to blindness. Pt primary concern is when she is going to rehab. Continue acute rehab follow.    Follow Up Recommendations  SNF     Equipment Recommendations  None recommended by PT (TBD by next venue)    Recommendations for Other Services       Precautions / Restrictions Precautions Precautions: Back;Fall;Other (comment) Precaution Comments: Pt required review of precautions, but was able to recall at the end of session. Required Braces or Orthoses: Spinal Brace Spinal Brace: Lumbar corset (Pt found with brace on while supine in bed per nurse.) Restrictions Weight Bearing Restrictions: No    Mobility  Bed Mobility Overal bed mobility: Needs Assistance Bed Mobility: Rolling;Sidelying to Sit Rolling: Supervision Sidelying to sit: Min assist       General bed mobility comments: Pt able to roll and manage legs, minA for trunk elevation  Transfers Overall transfer level: Needs assistance Equipment used: Rolling walker (2 wheeled) Transfers: Sit to/from Stand Sit to Stand: Min assist         General transfer comment: Pt requires cueing to push off bed and hand placement on RW. Min assist also required for boosting.  Ambulation/Gait Ambulation/Gait assistance: Min assist Ambulation Distance (Feet): 100 Feet Assistive device: Rolling walker (2 wheeled) Gait Pattern/deviations: Antalgic;Step-through pattern;Decreased stride length   Gait velocity interpretation: Below normal speed for age/gender General  Gait Details: Pt requires cueing for placement of walker for turns and navigation around room due to blindness. She requires cueing for increased stride length.   Stairs            Wheelchair Mobility    Modified Rankin (Stroke Patients Only)       Balance Overall balance assessment: Needs assistance Sitting-balance support: No upper extremity supported;Feet supported Sitting balance-Leahy Scale: Fair     Standing balance support: Bilateral upper extremity supported Standing balance-Leahy Scale: Poor                      Cognition Arousal/Alertness: Awake/alert Behavior During Therapy: WFL for tasks assessed/performed Overall Cognitive Status: Within Functional Limits for tasks assessed                      Exercises      General Comments General comments (skin integrity, edema, etc.): Pt complained of dizziness but was not exacerbated by treatment. She was self-motivated to walk farther down hall today.      Pertinent Vitals/Pain Pain Assessment: 0-10 Pain Score: 7  Pain Location: back Pain Descriptors / Indicators: Discomfort Pain Intervention(s): Premedicated before session;Monitored during session    Home Living                      Prior Function            PT Goals (current goals can now be found in the care plan section) Acute Rehab PT Goals Potential to Achieve Goals: Good Progress towards PT goals: Progressing toward goals  Frequency  Min 5X/week    PT Plan Current plan remains appropriate    Co-evaluation             End of Session Equipment Utilized During Treatment: Gait belt;Back brace Activity Tolerance: Patient tolerated treatment well Patient left: in chair;with call bell/phone within reach;with chair alarm set     Time: [id number] PT Time Calculation (min) (ACUTE ONLY): 21 min  Charges:  $Gait Training: 8-22 mins                    G Codes:      Arion Shankles November 20, 2015, 3:15 PM  Tawni Millers, SPT (student physical therapist) [place] [phone]

## 2015-11-17 NOTE — Care Management Important Message (Signed)
Important Message  Patient Details  Name: Kathryn Romero MRN: [id number] Date of Birth: 08-16-1935   Medicare Important Message Given:  Yes    Loann Quill 11/17/2015, 9:01 AM

## 2015-11-17 NOTE — Care Management Note (Signed)
Case Management Note  Patient Details  Name: Kathryn Romero MRN: [id number] Date of Birth: 02-Sep-1935  Subjective/Objective:     Pt underwent:   Lumbar Four-Five Maximum access posterior lumbar interbody fusion, Lumbar Three-Four Lumbar Four-Five Posterolateral Fusion and Pedicle Screws. She is from home alone.         Action/Plan: PT/OT recommendations are for SNF. Pt has bed offer from [facility]. CM following for d/c needs.   Expected Discharge Date:                  Expected Discharge Plan:  [place]  In-House Referral:     Discharge planning Services     Post Acute Care Choice:    Choice offered to:     DME Arranged:    DME Agency:     HH Arranged:    [place] Agency:     Status of Service:  In process, will continue to follow  If discussed at Long Length of Stay Meetings, dates discussed:    Additional Comments:  Pollie Friar, RN 11/17/2015, 2:46 PM

## 2015-11-17 NOTE — Anesthesia Postprocedure Evaluation (Signed)
Anesthesia Post Note  Patient: Kathryn Romero  Procedure(s) Performed: Procedure(s) (LRB): Lumbar Four-Five Maximum access posterior lumbar interbody fusion, Lumbar Three-Four Lumbar Four-Five Posterolateral Fusion and Pedicle Screws (N/A)  Anesthesia Post Evaluation   Last Vitals:  Filed Vitals:   11/17/15 0218 11/17/15 0605  BP: 147/55 135/49  Pulse: 80 72  Temp: 37.2 C 36.6 C  Resp: 20 14    Last Pain:  Filed Vitals:   11/17/15 0606  PainSc: Asleep   Pain Goal: Patients Stated Pain Goal: 2 (11/16/15 2000)               Montez Hageman

## 2015-11-17 NOTE — Progress Notes (Signed)
Subjective: Patient reports "I'm ok , but a lot of pain over the weekend"  Objective: Vital signs in last 24 hours: Temp:  [97.9 F (36.6 C)-100 F (37.8 C)] 97.9 F (36.6 C) (07/10 0605) Pulse Rate:  [72-81] 72 (07/10 0605) Resp:  [14-20] 14 (07/10 0605) BP: (118-147)/(40-55) 135/49 mmHg (07/10 0605) SpO2:  [90 %-94 %] 94 % (07/10 0605)  Intake/Output from previous day:   Intake/Output this shift:    Alert, conversant, sitting in chair eating breakfast. Good strength BLE. Incisin flat, without erythema, swelling, or drainage beneath honeycomb and Dermabond. Pain complaints typical, lumbar and right thigh - reassured & will monitor.   Lab Results: No results for input(s): WBC, HGB, HCT, PLT in the last 72 hours. BMET No results for input(s): NA, K, CL, CO2, GLUCOSE, BUN, CREATININE, CALCIUM in the last 72 hours.  Studies/Results: No results found.  Assessment/Plan: Improving   LOS: 3 days  Mobilize with PT. Hopeful of [place] for SNF. Ok per DrJones to change Pepcid Iv to PO and advance diet. Order entered.    Verdis Prime 11/17/2015, 8:19 AM    Seen and agree, Moving legs well, pain seems better controlled

## 2015-11-18 DIAGNOSIS — M6281 Muscle weakness (generalized): Secondary | ICD-10-CM | POA: Diagnosis not present

## 2015-11-18 DIAGNOSIS — M545 Low back pain: Secondary | ICD-10-CM | POA: Diagnosis not present

## 2015-11-18 DIAGNOSIS — R2681 Unsteadiness on feet: Secondary | ICD-10-CM | POA: Diagnosis not present

## 2015-11-18 DIAGNOSIS — E785 Hyperlipidemia, unspecified: Secondary | ICD-10-CM | POA: Diagnosis not present

## 2015-11-18 DIAGNOSIS — H548 Legal blindness, as defined in USA: Secondary | ICD-10-CM | POA: Diagnosis not present

## 2015-11-18 DIAGNOSIS — R42 Dizziness and giddiness: Secondary | ICD-10-CM | POA: Diagnosis not present

## 2015-11-18 DIAGNOSIS — I10 Essential (primary) hypertension: Secondary | ICD-10-CM | POA: Diagnosis not present

## 2015-11-18 DIAGNOSIS — R262 Difficulty in walking, not elsewhere classified: Secondary | ICD-10-CM | POA: Diagnosis not present

## 2015-11-18 DIAGNOSIS — M4316 Spondylolisthesis, lumbar region: Secondary | ICD-10-CM | POA: Diagnosis not present

## 2015-11-18 DIAGNOSIS — K5901 Slow transit constipation: Secondary | ICD-10-CM | POA: Diagnosis not present

## 2015-11-18 DIAGNOSIS — M5416 Radiculopathy, lumbar region: Secondary | ICD-10-CM | POA: Diagnosis not present

## 2015-11-18 DIAGNOSIS — M4716 Other spondylosis with myelopathy, lumbar region: Secondary | ICD-10-CM | POA: Diagnosis not present

## 2015-11-18 DIAGNOSIS — D62 Acute posthemorrhagic anemia: Secondary | ICD-10-CM | POA: Diagnosis not present

## 2015-11-18 DIAGNOSIS — M4320 Fusion of spine, site unspecified: Secondary | ICD-10-CM | POA: Diagnosis not present

## 2015-11-18 DIAGNOSIS — M4326 Fusion of spine, lumbar region: Secondary | ICD-10-CM | POA: Diagnosis not present

## 2015-11-18 DIAGNOSIS — M79609 Pain in unspecified limb: Secondary | ICD-10-CM | POA: Diagnosis not present

## 2015-11-18 DIAGNOSIS — Z981 Arthrodesis status: Secondary | ICD-10-CM | POA: Diagnosis not present

## 2015-11-18 DIAGNOSIS — R278 Other lack of coordination: Secondary | ICD-10-CM | POA: Diagnosis not present

## 2015-11-18 DIAGNOSIS — Z5189 Encounter for other specified aftercare: Secondary | ICD-10-CM | POA: Diagnosis not present

## 2015-11-18 NOTE — Progress Notes (Signed)
Patient will DC to: [place] Anticipated DC date: 11/18/15 Family notified: Brother Transport by: Corey Harold   Per MD patient ready for DC to [facility]. RN, patient, patient's family, and facility notified of DC. RN given number for report. DC packet on chart. Ambulance transport requested for patient.   CSW signing off.  Cedric Fishman, [place] Social Worker [phone] t

## 2015-11-18 NOTE — Progress Notes (Signed)
Physical Therapy Treatment Patient Details Name: Kathryn Romero MRN: [id number] DOB: [birth year] Today's Date: 11/18/2015    History of Present Illness 80 y.o. female s/p L3-5 PLIF. Pt is legally blind.    PT Comments    Pt is making steady progress with mobility toward goals. No increase in pain reported with session. Acute PT to continue during pt's hospital stay.  Follow Up Recommendations  SNF     Equipment Recommendations  None recommended by PT (to be addressed at next venue)    Precautions / Restrictions Precautions Precautions: Back;Fall;Other (comment) Precaution Comments: pt with brace on in chair on arrival; reviewed back precautions with pt Required Braces or Orthoses: Spinal Brace Spinal Brace: Lumbar corset Restrictions Weight Bearing Restrictions: No    Mobility  Bed Mobility               General bed mobility comments: pt in recliner before and after session  Transfers Overall transfer level: Needs assistance Equipment used: Rolling walker (2 wheeled) Transfers: Sit to/from Stand Sit to Stand: Min guard         General transfer comment: cues needed for sequencing and for hand placement for safety  Ambulation/Gait Ambulation/Gait assistance: Supervision Ambulation Distance (Feet): 110 Feet Assistive device: Rolling walker (2 wheeled) Gait Pattern/deviations: Step-through pattern;Decreased stride length Gait velocity: decreased Gait velocity interpretation: Below normal speed for age/gender General Gait Details: pt with stiff, guarded posture. cues for increased step/stride length with gait. cues needed for obstacle/wall avoidance due to blindness.       Cognition Arousal/Alertness: Awake/alert Behavior During Therapy: WFL for tasks assessed/performed Overall Cognitive Status: Within Functional Limits for tasks assessed                       Pertinent Vitals/Pain Pain Assessment: 0-10 Pain Score: 6  Pain Location:  back Pain Descriptors / Indicators: Aching;Discomfort Pain Intervention(s): Limited activity within patient's tolerance;Monitored during session;Patient requesting pain meds-RN notified;Repositioned     PT Goals (current goals can now be found in the care plan section) Acute Rehab PT Goals Patient Stated Goal: To go to rehab place PT Goal Formulation: With patient Time For Goal Achievement: 11/29/15 Potential to Achieve Goals: Good Progress towards PT goals: Progressing toward goals    Frequency  Min 5X/week    PT Plan Current plan remains appropriate    End of Session Equipment Utilized During Treatment: Gait belt;Back brace Activity Tolerance: Patient tolerated treatment well Patient left: in chair;with call bell/phone within reach;with chair alarm set     Time: 0837-0900 PT Time Calculation (min) (ACUTE ONLY): 23 min  Charges:  $Gait Training: 8-22 mins $Therapeutic Activity: 8-22 mins           Willow Ora 11/18/2015, 9:11 AM   Willow Ora, PTA, CLT Acute Rehab Services [phone] 11/18/2015, 9:12 AM

## 2015-11-18 NOTE — Progress Notes (Signed)
Discharge orders received, [facility], Attempted multiple times to call report. IV d/c'd. D/c instructions and RX given with verbalized understanding.Transfered by [facility]

## 2015-11-18 NOTE — Discharge Summary (Signed)
Physician Discharge Summary  Patient ID: Kathryn Romero MRN: [id number] DOB/AGE: Jul 03, 1935 80 y.o.  Admit date: 11/14/2015 Discharge date: 11/18/2015  Admission Diagnoses: Spondylolisthesis L 34, L 45 levels with stenosis, spondylosis, radiculopathy   Discharge Diagnoses: Spondylolisthesis L 34, L 45 levels with stenosis, spondylosis, radiculopathy s/p Lumbar Four-Five Maximum access posterior lumbar interbody fusion, Lumbar Three-Four Lumbar Four-Five Posterolateral Fusion and Pedicle Screws (N/A) - L3-4 L4-5 Maximum access posterior lumbar interbody fusion with interbody cages L 45 with pedicle screws L 3, L 4, L 5 with posterolateral arthrodesis L 3 - L 5 levels  Active Problems:   Spondylolisthesis at L4-L5 level   Discharged Condition: good  Hospital Course: Kathryn Romero was admitted for surgery with dx spondylosis, spondylolisthesis, radiculopathy. Following uncomplicated decompression and fusion L3-4, L4-5, she recovered well and transferred to [facility] for nursing care and therapies.  She has progressed nicely.   Consults: None  Significant Diagnostic Studies: radiology: X-Ray: intar-op  Treatments: surgery: Lumbar Four-Five Maximum access posterior lumbar interbody fusion, Lumbar Three-Four Lumbar Four-Five Posterolateral Fusion and Pedicle Screws (N/A) - L3-4 L4-5 Maximum access posterior lumbar interbody fusion with interbody cages L 45 with pedicle screws L 3, L 4, L 5 with posterolateral arthrodesis L 3 - L 5 levels   Discharge Exam: Blood pressure 122/50, pulse 65, temperature 98.4 F (36.9 C), temperature source Oral, resp. rate 18, height 5\' 1"  (1.549 m), weight 84 kg (185 lb 3 oz), SpO2 92 %. Awakens to voice. Conversant, smiling, reporting lumbar pain only with ambulation, but controlled by medication. Strength is good BLE. Incision is flat, nontender, without erythema or drainage beneath honeycomb and Dermabond. No BM yet, and no reported discomfort. She may request  a suppository this morning for BM before discharge.    Disposition: Discharge to SNF, [facility]. Pt has f/u appt with Dr. Vertell Limber in office on the 26th. She verbalizes understanding of d/c instructions. Rx's Percocet 5/325  and Robaxin 500mg  tubed to floor for d/c. Ok to remove honeycomb dressing. Pt may shower, cleansing incision with warm soapy washcloth & rinsing. Ok to leave dry & open to air. Post-op PRN meds include Naproxen 250mg  BID prn pain; Percocet 5/325 1-2 po q 4-6 hrs prn pain (Rx printed); Robaxin 500mg  1 po up to TID prn spasm (rx printed). Mikki Santee may not be reflected in the prepopulated list below]      Medication List    ASK your doctor about these medications        MAGNESIUM CHLORIDE PO  Take 1 tablet by mouth daily.     MULTIVITAMIN & MINERAL PO  Take 1 tablet by mouth daily.     pravastatin 20 MG tablet  Commonly known as:  PRAVACHOL  Take 1 tablet by moth at bedtime         Signed: Verdis Prime 11/18/2015, 8:17 AM

## 2015-11-18 NOTE — Clinical Social Work Placement (Signed)
   CLINICAL SOCIAL WORK PLACEMENT  NOTE  Date:  11/18/2015  Patient Details  Name: VESENIA PEARMAN MRN: [id number] Date of Birth: 1935-07-09  Clinical Social Work is seeking post-discharge placement for this patient at the [place] level of care (*CSW will initial, date and re-position this form in  chart as items are completed):  Yes   Patient/family provided with [place] Work Department's list of facilities offering this level of care within the geographic area requested by the patient (or if unable, by the patient's family).  Yes   Patient/family informed of their freedom to choose among providers that offer the needed level of care, that participate in Medicare, Medicaid or managed care program needed by the patient, have an available bed and are willing to accept the patient.  Yes   Patient/family informed of [facility]'s ownership interest in [facility] and [facility], as well as of the fact that they are under no obligation to receive care at these facilities.  PASRR submitted to EDS on 11/16/15     PASRR number received on 11/16/15     Existing PASRR number confirmed on       FL2 transmitted to all facilities in geographic area requested by pt/family on 11/16/15     FL2 transmitted to all facilities within larger geographic area on       Patient informed that his/her managed care company has contracts with or will negotiate with certain facilities, including the following:        Yes   Patient/family informed of bed offers received.  Patient chooses bed at [facility]     Physician recommends and patient chooses bed at      Patient to be transferred to [facility] on 11/18/15.  Patient to be transferred to facility by [facility]     Patient family notified on 11/18/15 of transfer.  Name of family member notified:  Kathreen Devoid     PHYSICIAN       Additional Comment:     _______________________________________________ Benard Halsted, Mark 11/18/2015, 11:54 AM

## 2015-11-18 NOTE — Progress Notes (Signed)
Subjective: Patient reports "I slept well. I walked in the hall yesterday, twice"  Objective: Vital signs in last 24 hours: Temp:  [98.1 F (36.7 C)-98.8 F (37.1 C)] 98.4 F (36.9 C) (07/11 0529) Pulse Rate:  [63-79] 65 (07/11 0529) Resp:  [16-18] 18 (07/11 0529) BP: (109-132)/(45-82) 122/50 mmHg (07/11 0529) SpO2:  [92 %-97 %] 92 % (07/11 0529)  Intake/Output from previous day: 07/10 0701 - 07/11 0700 In: 120 [P.O.:120] Out: -  Intake/Output this shift:    Awakens to voice. Conversant, smiling, reporting lumbar pain only with ambulation, but controlled by medication. Strength is good BLE. Incision is flat, nontender, without erythema or drainage beneath honeycomb and Dermabond. No BM yet, and no reported discomfort. She may request a suppository this morning for BM before discharge.   Lab Results: No results for input(s): WBC, HGB, HCT, PLT in the last 72 hours. BMET No results for input(s): NA, K, CL, CO2, GLUCOSE, BUN, CREATININE, CALCIUM in the last 72 hours.  Studies/Results: No results found.  Assessment/Plan: Improving   LOS: 4 days  Planning d/c to SNF, [facility]. Pt has f/u appt with Dr. Vertell Limber in office on the 26th. She verbalizes understanding of d/c instructions.    Verdis Prime 11/18/2015, 7:45 AM

## 2015-11-18 NOTE — Care Management Note (Signed)
Case Management Note  Patient Details  Name: Kathryn Romero MRN: [id number] Date of Birth: 02/05/1936  Subjective/Objective:                    Action/Plan: Pt discharging to [facility] today. No further needs per CM.   Expected Discharge Date:                  Expected Discharge Plan:  [place]  In-House Referral:     Discharge planning Services     Post Acute Care Choice:    Choice offered to:     DME Arranged:    DME Agency:     HH Arranged:    [place] Agency:     Status of Service:  In process, will continue to follow  If discussed at Long Length of Stay Meetings, dates discussed:    Additional Comments:  Pollie Friar, RN 11/18/2015, 10:36 AM

## 2015-11-19 ENCOUNTER — Non-Acute Institutional Stay (SKILLED_NURSING_FACILITY): Payer: PPO | Admitting: Adult Health

## 2015-11-19 ENCOUNTER — Encounter: Payer: Self-pay | Admitting: Adult Health

## 2015-11-19 DIAGNOSIS — M4316 Spondylolisthesis, lumbar region: Secondary | ICD-10-CM

## 2015-11-19 DIAGNOSIS — K5901 Slow transit constipation: Secondary | ICD-10-CM

## 2015-11-19 DIAGNOSIS — R2681 Unsteadiness on feet: Secondary | ICD-10-CM | POA: Diagnosis not present

## 2015-11-19 DIAGNOSIS — E785 Hyperlipidemia, unspecified: Secondary | ICD-10-CM | POA: Diagnosis not present

## 2015-11-19 NOTE — Progress Notes (Signed)
Patient ID: Kathryn Romero, female   DOB: [birth year], 80 y.o.   MRN: [id number]    DATE:  11/19/2015   MRN:  [id number]  BIRTHDAY: 08/27/1935  Facility:  Nursing Home Location:  [place] Room Number: 903-B  LEVEL OF CARE:  SNF (31)  Contact Information    Name Relation Home Work Mobile   [place] Brother   [phone]       Code Status History    Date Active Date Inactive Code Status Order ID Comments User Context   11/14/2015  5:35 PM 11/18/2015  7:26 PM Full Code [id number]  Erline Levine, MD Inpatient       Chief Complaint  Patient presents with  . Hospitalization Follow-up    HISTORY OF PRESENT ILLNESS:  This is a 80 year old female who has been admitted to [facility] on 11/18/15 from [facility] with lumbar spondylolisthesis L3-4, L4-5 with stenosis and radiculopathy for which she had decompression and fusion on 11/14/15. She has been admitted for a short-term rehabilitation.  She is seen today and complained of constipation.  PAST MEDICAL HISTORY:  Past Medical History  Diagnosis Date  . RETINITIS PIGMENTOSA   . ANEMIA-NOS   . ANXIETY   . HYPERLIPIDEMIA     takes Pravastatin daily  . SYNDROME, CARPAL TUNNEL   . Cancer [facility]) 2005    colon cancer  . Weakness     numbness and tingling in legs and feet  . Arthritis   . Chronic back pain     spondylolisthesis  . Nocturia   . History of blood transfusion     no abnormal reaction noted  . Legally blind   . History of shingles      CURRENT MEDICATIONS: Reviewed  Patient's Medications  New Prescriptions   No medications on file  Previous Medications   MAGNESIUM CHLORIDE PO    Take 1 tablet by mouth daily.   METHOCARBAMOL (ROBAXIN) 500 MG TABLET    Take 500 mg by mouth every 8 (eight) hours as needed.    MULTIPLE VITAMINS-MINERALS (MULTIVITAMIN & MINERAL PO)    Take 1 tablet by mouth daily.   NAPROXEN (NAPROSYN) 250 MG TABLET    Take 250 mg by mouth 2  (two) times daily with a meal.   OXYCODONE-ACETAMINOPHEN (PERCOCET/ROXICET) 5-325 MG TABLET    Take 1-2 tablets by mouth every 4 (four) hours as needed for severe pain.   PRAVASTATIN (PRAVACHOL) 20 MG TABLET    Take 1 tablet by moth at bedtime  Modified Medications   No medications on file  Discontinued Medications   No medications on file     Allergies  Allergen Reactions  . Codeine Shortness Of Breath  . Hydromet [Hydrocodone-Homatropine] Shortness Of Breath and Other (See Comments)    Wheezing  . Fish Allergy Other (See Comments)    Burning Sensation and Headache  . Morphine Other (See Comments)    "feels funny"  . Sulfa Antibiotics Nausea And Vomiting          REVIEW OF SYSTEMS:  GENERAL: no change in appetite, no fatigue, no weight changes, no fever, chills or weakness EYES: Denies change in vision, dry eyes, eye pain, itching or discharge EARS: Denies change in hearing, ringing in ears, or earache NOSE: Denies nasal congestion or epistaxis MOUTH and THROAT: Denies oral discomfort, gingival pain or bleeding, pain from teeth or hoarseness   RESPIRATORY: no cough, SOB, DOE, wheezing, hemoptysis CARDIAC: no chest pain,  edema or palpitations GI: no abdominal pain, diarrhea, heart burn, nausea or vomiting, +constipation GU: Denies dysuria, frequency, hematuria, incontinence, or discharge PSYCHIATRIC: Denies feeling of depression or anxiety. No report of hallucinations, insomnia, paranoia, or agitation   PHYSICAL EXAMINATION  GENERAL APPEARANCE: Well nourished. In no acute distress. Obese SKIN:  Midline lower back incision is covered with honey-comb dressing, dry and no erythema HEAD: Normal in size and contour. No evidence of trauma EYES: Lids open and close normally. No blepharitis, entropion or ectropion. PERRL. Conjunctivae are clear and sclerae are white. Lenses are without opacity EARS: Pinnae are normal. Patient hears normal voice tunes of the examiner MOUTH and  THROAT: Lips are without lesions. Oral mucosa is moist and without lesions. Tongue is normal in shape, size, and color and without lesions NECK: supple, trachea midline, no neck masses, no thyroid tenderness, no thyromegaly LYMPHATICS: no LAN in the neck, no supraclavicular LAN RESPIRATORY: breathing is even & unlabored, BS CTAB CARDIAC: RRR, no murmur,no extra heart sounds, no edema GI: abdomen soft, normal BS, no masses, no tenderness, no hepatomegaly, no splenomegaly EXTREMITIES:  Able to move 4 extremities PSYCHIATRIC: Alert and oriented X 3. Affect and behavior are appropriate  LABS/RADIOLOGY: Labs reviewed: Basic Metabolic Panel:  Recent Labs  11/06/15 1131  NA 140  K 4.2  CL 110  CO2 23  GLUCOSE 104*  BUN 25*  CREATININE 0.73  CALCIUM 9.8   CBC:  Recent Labs  11/06/15 1131  WBC 4.2  HGB 13.5  HCT 40.9  MCV 90.1  PLT 173   CBG:  Recent Labs  11/15/15 1123  GLUCAP 129*      Dg Lumbar Spine 2-3 Views  11/14/2015  CLINICAL DATA:  L3-4, L4-5 posterior fusion EXAM: DG C-ARM 61-120 MIN; LUMBAR SPINE - 2-3 VIEW COMPARISON:  10/07/2015 FINDINGS: Two intraoperative spot images demonstrate changes of posterior fusion with posterior pedicle screws from L3 through L5. Normal alignment. No visible hardware complicating feature. IMPRESSION: Posterior fusion L3-L5. Electronically Signed   By: Rolm Baptise M.D.   On: 11/14/2015 14:40   Dg C-arm 1-60 Min  11/14/2015  CLINICAL DATA:  L3-4, L4-5 posterior fusion EXAM: DG C-ARM 61-120 MIN; LUMBAR SPINE - 2-3 VIEW COMPARISON:  10/07/2015 FINDINGS: Two intraoperative spot images demonstrate changes of posterior fusion with posterior pedicle screws from L3 through L5. Normal alignment. No visible hardware complicating feature. IMPRESSION: Posterior fusion L3-L5. Electronically Signed   By: Rolm Baptise M.D.   On: 11/14/2015 14:40    ASSESSMENT/PLAN:  Gait instability - for rehabilitation, PT and OT; fall  precautions  Spondylolisthesis with stenosis and radiculopathy S/P decompression and fusion - for rehabilitation, PT and OT; continue naproxen 250 mg 1 tab by mouth twice a day when necessary and Percocet 5/325 mg 1-2 tabs by mouth every 4 hours when necessary for pain; Robaxin 500 mg 1 tab by mouth 3 times a day when necessary for muscle spasm; follow-up with Dr. Vertell Limber, neurosurgeon, on 12/03/15; check CBC and CMP  Hyperlipidemia - continue pravastatin 20 mg 1 tab by mouth daily at bedtime; check lipid panel  Constipation - start senna S  8.6-50 mg 2 tabs by mouth twice a day and MiraLAX 17 g by mouth daily      Goals of care:  Short-term rehabilitation    Durenda Age, NP [place] [phone]

## 2015-11-21 ENCOUNTER — Non-Acute Institutional Stay (SKILLED_NURSING_FACILITY): Payer: PPO | Admitting: Internal Medicine

## 2015-11-21 ENCOUNTER — Encounter: Payer: Self-pay | Admitting: Internal Medicine

## 2015-11-21 DIAGNOSIS — K5901 Slow transit constipation: Secondary | ICD-10-CM | POA: Diagnosis not present

## 2015-11-21 DIAGNOSIS — M4316 Spondylolisthesis, lumbar region: Secondary | ICD-10-CM | POA: Diagnosis not present

## 2015-11-21 DIAGNOSIS — E785 Hyperlipidemia, unspecified: Secondary | ICD-10-CM | POA: Diagnosis not present

## 2015-11-21 DIAGNOSIS — I10 Essential (primary) hypertension: Secondary | ICD-10-CM | POA: Diagnosis not present

## 2015-11-21 DIAGNOSIS — R2681 Unsteadiness on feet: Secondary | ICD-10-CM | POA: Diagnosis not present

## 2015-11-21 NOTE — Progress Notes (Signed)
LOCATION: [facility]  PCP: Tivis Ringer, MD   Code Status: Full Code  Goals of care: Advanced Directive information Advanced Directives 11/14/2015  Does patient have an advance directive? Yes  Type of Paramedic of [place];Living will  Copy of advanced directive(s) in chart? No - copy requested       Extended Emergency Contact Information Primary Emergency Contact: Gogonl,Jacques Address: [address]          [place], [place] [address] [place] [employer] Phone: [phone] Relation: Brother   Allergies  Allergen Reactions  . Codeine Shortness Of Breath  . Hydromet [Hydrocodone-Homatropine] Shortness Of Breath and Other (See Comments)    Wheezing  . Fish Allergy Other (See Comments)    Burning Sensation and Headache  . Morphine Other (See Comments)    "feels funny"  . Sulfa Antibiotics Nausea And Vomiting         Chief Complaint  Patient presents with  . New Admit To SNF    New Admission     HPI:  Patient is a 80 y.o. female seen today for short term rehabilitation post hospital admission from 11/14/15-11/18/15 with lumbar spondylolisthesis L3-4, L4-5 with stenosis and radiculopathy. She underwent decompression and fusion of her lumbar spine on 11/14/15. She is seen in her room today.  Review of Systems:  Constitutional: Negative for fever, chills, diaphoresis. Energy level is slowly coming back.  HENT: Negative for congestion, nasal discharge, hearing loss, sore throat, difficulty swallowing. Positive for occasional headaches.  Eyes: Negative for blurred vision, double vision and discharge.  Respiratory: Negative for cough, shortness of breath and wheezing.   Cardiovascular: Negative for chest pain, palpitations, leg swelling.  Gastrointestinal: Negative for vomiting, abdominal pain, loss of appetite, melena, diarrhea. Positive for occasional heartburn and nausea. Last bowel movement was 2 weeks ago. Passing  flatus. Genitourinary: Negative for dysuria and flank pain.  Musculoskeletal: Negative for fall in the facility. pain medication has been helping with her low back and leg pain.  Skin: Negative for itching, rash.  Neurological: Positive for dizziness with change of position. Psychiatric/Behavioral: Negative for depression   Past Medical History  Diagnosis Date  . RETINITIS PIGMENTOSA   . ANEMIA-NOS   . ANXIETY   . HYPERLIPIDEMIA     takes Pravastatin daily  . SYNDROME, CARPAL TUNNEL   . Cancer [facility]) 2005    colon cancer  . Weakness     numbness and tingling in legs and feet  . Arthritis   . Chronic back pain     spondylolisthesis  . Nocturia   . History of blood transfusion     no abnormal reaction noted  . Legally blind   . History of shingles    Past Surgical History  Procedure Laterality Date  . Appendectomy  1946  . S/p right hemicolectomy  12 yrs ago  . Carapl tunnel release Left   . Abdominal hysterectomy  1989  . Cataract surgery Bilateral   . Oophorectomy  1989  . S/p ganglion cyst  1973  . Colonoscopy    . Maximum access (mas)posterior lumbar interbody fusion (plif) 2 level N/A 11/14/2015    Procedure: Lumbar Four-Five Maximum access posterior lumbar interbody fusion, Lumbar Three-Four Lumbar Four-Five Posterolateral Fusion and Pedicle Screws;  Surgeon: Erline Levine, MD;  Location: [facility];  Service: Neurosurgery;  Laterality: N/A;  L3-4 L4-5 Maximum access posterior lumbar interbody fusion   Social History:   reports that she has never smoked.  She has never used smokeless tobacco. She reports that she does not drink alcohol or use illicit drugs.  Family History  Problem Relation Age of Onset  . ALS Cousin   . Polymyalgia rheumatica Cousin   . Colon cancer Neg Hx     Medications:   Medication List       This list is accurate as of: 11/21/15  3:07 PM.  Always use your most recent med list.               MAGNESIUM CHLORIDE PO  Take 1 tablet by  mouth daily.     methocarbamol 500 MG tablet  Commonly known as:  ROBAXIN  Take 500 mg by mouth 3 (three) times daily as needed.     MULTIVITAMIN & MINERAL PO  Take 1 tablet by mouth daily.     naproxen 250 MG tablet  Commonly known as:  NAPROSYN  Take 250 mg by mouth 2 (two) times daily as needed.     oxyCODONE-acetaminophen 5-325 MG tablet  Commonly known as:  PERCOCET/ROXICET  Take 1-2 tablets by mouth every 4 (four) hours as needed for severe pain.     polyethylene glycol packet  Commonly known as:  MIRALAX / GLYCOLAX  Take 17 g by mouth daily.     pravastatin 20 MG tablet  Commonly known as:  PRAVACHOL  Take 1 tablet by moth at bedtime     senna 8.6 MG tablet  Commonly known as:  SENOKOT  Take 2 tablets by mouth 2 (two) times daily.        Immunizations: Immunization History  Administered Date(s) Administered  . Influenza Whole 01/10/2009  . Pneumococcal Conjugate-13 05/20/2014  . Pneumococcal Polysaccharide-23 03/25/2003, 10/14/2008  . Td 05/11/1991, 12/16/2008  . Zoster 05/11/2003     Physical Exam: Filed Vitals:   11/21/15 1503  BP: 152/70  Pulse: 80  Resp: 18  Height: 5\' 1"  (1.549 m)  Weight: 185 lb 1.6 oz (83.961 kg)  SpO2: 97%   Body mass index is 34.99 kg/(m^2).  General- elderly female, obese, in no acute distress Head- normocephalic, atraumatic Nose- no maxillary or frontal sinus tenderness, no nasal discharge Throat- moist mucus membrane Eyes- PERRLA, EOMI, no pallor, no icterus, no discharge, normal conjunctiva, normal sclera Neck- no cervical lymphadenopathy Cardiovascular- normal s1,s2, no murmur, trace leg edema Respiratory- bilateral clear to auscultation, no wheeze, no rhonchi, no crackles, no use of accessory muscles Abdomen- bowel sounds present, soft, non tender Musculoskeletal- able to move all 4 extremities, generalized weakness, has her back brace Neurological- alert and oriented to person, place and time Skin- warm and dry,  lumbar surgical incision with honeycomb dressing in place Psychiatry- normal mood and affect    Labs reviewed: Basic Metabolic Panel:  Recent Labs  11/06/15 1131  NA 140  K 4.2  CL 110  CO2 23  GLUCOSE 104*  BUN 25*  CREATININE 0.73  CALCIUM 9.8   CBC:  Recent Labs  11/06/15 1131  WBC 4.2  HGB 13.5  HCT 40.9  MCV 90.1  PLT 173    Radiological Exams: Dg Lumbar Spine 2-3 Views  11/14/2015  CLINICAL DATA:  L3-4, L4-5 posterior fusion EXAM: DG C-ARM 61-120 MIN; LUMBAR SPINE - 2-3 VIEW COMPARISON:  10/07/2015 FINDINGS: Two intraoperative spot images demonstrate changes of posterior fusion with posterior pedicle screws from L3 through L5. Normal alignment. No visible hardware complicating feature. IMPRESSION: Posterior fusion L3-L5. Electronically Signed   By: Rolm Baptise M.D.  On: 11/14/2015 14:40   Dg C-arm 1-60 Min  11/14/2015  CLINICAL DATA:  L3-4, L4-5 posterior fusion EXAM: DG C-ARM 61-120 MIN; LUMBAR SPINE - 2-3 VIEW COMPARISON:  10/07/2015 FINDINGS: Two intraoperative spot images demonstrate changes of posterior fusion with posterior pedicle screws from L3 through L5. Normal alignment. No visible hardware complicating feature. IMPRESSION: Posterior fusion L3-L5. Electronically Signed   By: Rolm Baptise M.D.   On: 11/14/2015 14:40    Assessment/Plan  Unsteady gait Will have her work with physical therapy and occupational therapy team to help with gait training and muscle strengthening exercises.fall precautions. Skin care. Encourage to be out of bed  Lumbar spondylolisthesis with stenosis and radiculopathy  S/P decompression and fusion. Will have patient work with PT/OT as tolerated to regain strength and restore function.  Fall precautions are in place. D/c naproxen. Continue Percocet 5-325 mg 1-2 tabs q4h prn pain and robaxin 500 mg tid prn muscle spasm. Has f/u with neurosurgery. Check cbc and bmp  Constipation On senna s bid and miralax daily. Change miralax to  bid for now and encourage hydration. Add prune juice in am. If no bowel movement, consider suppository. no signs of acute abdomen on exam at present  Hypertension Elevated BP, not on any antihypertensives at present. Check bp q shift for now and if > 3 reading is > 140/90, start antihypertensives  Hyperlipidemia continue pravastatin 20 mg daily    Goals of care: short term rehabilitation   Labs/tests ordered: cbc, cmp  Family/ staff Communication: reviewed care plan with patient and nursing supervisor    Blanchie Serve, MD Internal Medicine [place], [place] [address] Cell Phone (Monday-Friday 8 am - 5 pm): [phone] On Call: [phone] and follow prompts after 5 pm and on weekends Office Phone: [phone] Office Fax: [phone]

## 2015-11-26 ENCOUNTER — Other Ambulatory Visit: Payer: Self-pay | Admitting: *Deleted

## 2015-11-26 MED ORDER — OXYCODONE-ACETAMINOPHEN 5-325 MG PO TABS: ORAL_TABLET | ORAL | Status: DC

## 2015-11-26 NOTE — Telephone Encounter (Signed)
[facility] 

## 2015-11-27 ENCOUNTER — Other Ambulatory Visit: Payer: Self-pay

## 2015-11-27 DIAGNOSIS — R2681 Unsteadiness on feet: Secondary | ICD-10-CM | POA: Diagnosis not present

## 2015-11-27 DIAGNOSIS — M545 Low back pain: Secondary | ICD-10-CM | POA: Diagnosis not present

## 2015-11-27 DIAGNOSIS — M6281 Muscle weakness (generalized): Secondary | ICD-10-CM | POA: Diagnosis not present

## 2015-11-27 DIAGNOSIS — Z5189 Encounter for other specified aftercare: Secondary | ICD-10-CM | POA: Diagnosis not present

## 2015-11-27 DIAGNOSIS — M4326 Fusion of spine, lumbar region: Secondary | ICD-10-CM | POA: Diagnosis not present

## 2015-11-27 DIAGNOSIS — M79609 Pain in unspecified limb: Secondary | ICD-10-CM | POA: Diagnosis not present

## 2015-11-27 DIAGNOSIS — R262 Difficulty in walking, not elsewhere classified: Secondary | ICD-10-CM | POA: Diagnosis not present

## 2015-11-27 LAB — CBC AND DIFFERENTIAL
HCT: 32 % — AB (ref 36–46)
Hemoglobin: 10.2 g/dL — AB (ref 12.0–16.0)
Platelets: 244 10*3/uL (ref 150–399)
WBC: 5.7 10^3/mL

## 2015-11-27 LAB — LIPID PANEL
Cholesterol: 150 mg/dL (ref 0–200)
HDL: 53 mg/dL (ref 35–70)
LDL Cholesterol: 71 mg/dL
Triglycerides: 132 mg/dL (ref 40–160)

## 2015-11-27 LAB — HEPATIC FUNCTION PANEL
ALT: 24 U/L (ref 7–35)
AST: 15 U/L (ref 13–35)
Alkaline Phosphatase: 62 U/L (ref 25–125)
Bilirubin, Total: 0.4 mg/dL

## 2015-11-27 LAB — BASIC METABOLIC PANEL
BUN: 17 mg/dL (ref 4–21)
Creatinine: 0.6 mg/dL (ref 0.5–1.1)
Glucose: 93 mg/dL
Potassium: 4 mmol/L (ref 3.4–5.3)
Sodium: 146 mmol/L (ref 137–147)

## 2015-11-27 MED ORDER — OXYCODONE-ACETAMINOPHEN 5-325 MG PO TABS: ORAL_TABLET | ORAL | Status: DC

## 2015-11-27 NOTE — Telephone Encounter (Signed)
Rx faxed to [facility] @ [phone], phone number [phone]  

## 2015-11-28 DIAGNOSIS — R2681 Unsteadiness on feet: Secondary | ICD-10-CM | POA: Diagnosis not present

## 2015-11-28 DIAGNOSIS — M6281 Muscle weakness (generalized): Secondary | ICD-10-CM | POA: Diagnosis not present

## 2015-11-28 DIAGNOSIS — Z5189 Encounter for other specified aftercare: Secondary | ICD-10-CM | POA: Diagnosis not present

## 2015-11-28 DIAGNOSIS — M79609 Pain in unspecified limb: Secondary | ICD-10-CM | POA: Diagnosis not present

## 2015-11-28 DIAGNOSIS — M4326 Fusion of spine, lumbar region: Secondary | ICD-10-CM | POA: Diagnosis not present

## 2015-11-28 DIAGNOSIS — R262 Difficulty in walking, not elsewhere classified: Secondary | ICD-10-CM | POA: Diagnosis not present

## 2015-11-28 DIAGNOSIS — M545 Low back pain: Secondary | ICD-10-CM | POA: Diagnosis not present

## 2015-12-01 DIAGNOSIS — M79609 Pain in unspecified limb: Secondary | ICD-10-CM | POA: Diagnosis not present

## 2015-12-01 DIAGNOSIS — R2681 Unsteadiness on feet: Secondary | ICD-10-CM | POA: Diagnosis not present

## 2015-12-01 DIAGNOSIS — M4326 Fusion of spine, lumbar region: Secondary | ICD-10-CM | POA: Diagnosis not present

## 2015-12-01 DIAGNOSIS — Z5189 Encounter for other specified aftercare: Secondary | ICD-10-CM | POA: Diagnosis not present

## 2015-12-01 DIAGNOSIS — M6281 Muscle weakness (generalized): Secondary | ICD-10-CM | POA: Diagnosis not present

## 2015-12-01 DIAGNOSIS — M545 Low back pain: Secondary | ICD-10-CM | POA: Diagnosis not present

## 2015-12-01 DIAGNOSIS — R262 Difficulty in walking, not elsewhere classified: Secondary | ICD-10-CM | POA: Diagnosis not present

## 2015-12-02 DIAGNOSIS — M4326 Fusion of spine, lumbar region: Secondary | ICD-10-CM | POA: Diagnosis not present

## 2015-12-02 DIAGNOSIS — M4316 Spondylolisthesis, lumbar region: Secondary | ICD-10-CM | POA: Diagnosis not present

## 2015-12-02 DIAGNOSIS — R2681 Unsteadiness on feet: Secondary | ICD-10-CM | POA: Diagnosis not present

## 2015-12-02 DIAGNOSIS — M79609 Pain in unspecified limb: Secondary | ICD-10-CM | POA: Diagnosis not present

## 2015-12-02 DIAGNOSIS — M6281 Muscle weakness (generalized): Secondary | ICD-10-CM | POA: Diagnosis not present

## 2015-12-02 DIAGNOSIS — R262 Difficulty in walking, not elsewhere classified: Secondary | ICD-10-CM | POA: Diagnosis not present

## 2015-12-02 DIAGNOSIS — Z5189 Encounter for other specified aftercare: Secondary | ICD-10-CM | POA: Diagnosis not present

## 2015-12-02 DIAGNOSIS — Z981 Arthrodesis status: Secondary | ICD-10-CM | POA: Diagnosis not present

## 2015-12-02 DIAGNOSIS — M545 Low back pain: Secondary | ICD-10-CM | POA: Diagnosis not present

## 2015-12-03 DIAGNOSIS — M5416 Radiculopathy, lumbar region: Secondary | ICD-10-CM | POA: Diagnosis not present

## 2015-12-03 DIAGNOSIS — Z5189 Encounter for other specified aftercare: Secondary | ICD-10-CM | POA: Diagnosis not present

## 2015-12-03 DIAGNOSIS — M4326 Fusion of spine, lumbar region: Secondary | ICD-10-CM | POA: Diagnosis not present

## 2015-12-03 DIAGNOSIS — M6281 Muscle weakness (generalized): Secondary | ICD-10-CM | POA: Diagnosis not present

## 2015-12-03 DIAGNOSIS — R2681 Unsteadiness on feet: Secondary | ICD-10-CM | POA: Diagnosis not present

## 2015-12-03 DIAGNOSIS — R262 Difficulty in walking, not elsewhere classified: Secondary | ICD-10-CM | POA: Diagnosis not present

## 2015-12-03 DIAGNOSIS — M545 Low back pain: Secondary | ICD-10-CM | POA: Diagnosis not present

## 2015-12-03 DIAGNOSIS — M79609 Pain in unspecified limb: Secondary | ICD-10-CM | POA: Diagnosis not present

## 2015-12-04 ENCOUNTER — Encounter: Payer: Self-pay | Admitting: Internal Medicine

## 2015-12-04 ENCOUNTER — Non-Acute Institutional Stay (SKILLED_NURSING_FACILITY): Payer: PPO | Admitting: Internal Medicine

## 2015-12-04 DIAGNOSIS — D62 Acute posthemorrhagic anemia: Secondary | ICD-10-CM

## 2015-12-04 DIAGNOSIS — R42 Dizziness and giddiness: Secondary | ICD-10-CM | POA: Diagnosis not present

## 2015-12-04 DIAGNOSIS — M79609 Pain in unspecified limb: Secondary | ICD-10-CM | POA: Diagnosis not present

## 2015-12-04 DIAGNOSIS — R2681 Unsteadiness on feet: Secondary | ICD-10-CM | POA: Diagnosis not present

## 2015-12-04 DIAGNOSIS — M4716 Other spondylosis with myelopathy, lumbar region: Secondary | ICD-10-CM

## 2015-12-04 DIAGNOSIS — M6281 Muscle weakness (generalized): Secondary | ICD-10-CM | POA: Diagnosis not present

## 2015-12-04 DIAGNOSIS — K5901 Slow transit constipation: Secondary | ICD-10-CM | POA: Diagnosis not present

## 2015-12-04 DIAGNOSIS — Z5189 Encounter for other specified aftercare: Secondary | ICD-10-CM | POA: Diagnosis not present

## 2015-12-04 DIAGNOSIS — M4326 Fusion of spine, lumbar region: Secondary | ICD-10-CM | POA: Diagnosis not present

## 2015-12-04 DIAGNOSIS — E785 Hyperlipidemia, unspecified: Secondary | ICD-10-CM

## 2015-12-04 DIAGNOSIS — M545 Low back pain: Secondary | ICD-10-CM | POA: Diagnosis not present

## 2015-12-04 DIAGNOSIS — R262 Difficulty in walking, not elsewhere classified: Secondary | ICD-10-CM | POA: Diagnosis not present

## 2015-12-04 MED ORDER — METHOCARBAMOL 500 MG PO TABS: 500.0000 mg | ORAL_TABLET | Freq: Every day | ORAL | Status: DC | PRN

## 2015-12-04 NOTE — Progress Notes (Signed)
LOCATION: [facility]  PCP: Tivis Ringer, MD   Code Status: Full Code  Goals of care: Advanced Directive information Advanced Directives 11/14/2015  Does patient have an advance directive? Yes  Type of Paramedic of [place];Living will  Does patient want to make changes to advanced directive? -  Copy of advanced directive(s) in chart? -  Would patient like information on creating an advanced directive? -       Extended Emergency Contact Information Primary Emergency Contact: Gogonl,Jacques Address: [address]          [place], [place] [address] [place] [employer] Phone: [phone] Relation: Brother   Allergies  Allergen Reactions  . Codeine Shortness Of Breath  . Hydromet [Hydrocodone-Homatropine] Shortness Of Breath and Other (See Comments)    Wheezing  . Fish Allergy Other (See Comments)    Burning Sensation and Headache  . Morphine Other (See Comments)    "feels funny"  . Sulfa Antibiotics Nausea And Vomiting         Chief Complaint  Patient presents with  . Discharge Note     HPI:  Patient is a 80 y.o. female seen today for discharge visit. She was here for short term rehabilitation post hospital admission from 11/14/15-11/18/15 with lumbar spondylolisthesis L3-4, L4-5 with stenosis and radiculopathy and is status post decompression and fusion of her lumbar spine on 11/14/15. She has worked with therapy team. Her pain is under control at present. She would like her percocet discontinued. robaxin has been helping her. She gets tired easily. On lab review, she has a drop in her hemoglobin. She was seen by neurosurgery yesterday and started on medication for dizziness with change of position.  Review of Systems:  Constitutional: Negative for fever HENT: Negative for congestion, nasal discharge Eyes: Negative fordischarge.  Respiratory: Negative for cough, shortness of breath and wheezing.   Cardiovascular: Negative for  chest pain, palpitations, leg swelling.  Gastrointestinal: Negative for vomiting, abdominal pain. Stool softner and laxative have been helpful Genitourinary: Negative for dysuria and flank pain.  Musculoskeletal: Negative for fall in the facility.  Skin: Negative for rash.     Past Medical History:  Diagnosis Date  . ANEMIA-NOS   . ANXIETY   . Arthritis   . Cancer [facility]) 2005   colon cancer  . Chronic back pain    spondylolisthesis  . History of blood transfusion    no abnormal reaction noted  . History of shingles   . HYPERLIPIDEMIA    takes Pravastatin daily  . Legally blind   . Nocturia   . RETINITIS PIGMENTOSA   . SYNDROME, CARPAL TUNNEL   . Weakness    numbness and tingling in legs and feet    Medications:   Medication List       Accurate as of 12/04/15  6:20 PM. Always use your most recent med list.          atorvastatin 10 MG tablet Commonly known as:  LIPITOR Take 10 mg by mouth at bedtime.   ferrous sulfate 325 (65 FE) MG tablet Take 325 mg by mouth daily with breakfast.   MAGNESIUM CHLORIDE PO Take 1 tablet by mouth daily.   meclizine 25 MG tablet Commonly known as:  ANTIVERT Take 25 mg by mouth 3 (three) times daily as needed for dizziness.   methocarbamol 500 MG tablet Commonly known as:  ROBAXIN Take 500 mg by mouth daily as needed for muscle spasms.   MULTIVITAMIN &  MINERAL PO Take 1 tablet by mouth daily.   polyethylene glycol packet Commonly known as:  MIRALAX / GLYCOLAX Take 17 g by mouth daily.   senna 8.6 MG tablet Commonly known as:  SENOKOT Take 2 tablets by mouth 2 (two) times daily.       Immunizations: Immunization History  Administered Date(s) Administered  . Influenza Whole 01/10/2009  . Pneumococcal Conjugate-13 05/20/2014  . Pneumococcal Polysaccharide-23 03/25/2003, 10/14/2008  . Td 05/11/1991, 12/16/2008  . Zoster 05/11/2003     Physical Exam: Vitals:   12/04/15 1253  BP: 136/66  Pulse: 68  Resp: 18    Temp: 98.2 F (36.8 C)  TempSrc: Oral  SpO2: 98%  Weight: 182 lb 9.6 oz (82.8 kg)  Height: 5\' 1"  (1.549 m)   Body mass index is 34.5 kg/m.  General- elderly female, obese, in no acute distress Head- normocephalic, atraumatic Eyes- PERRLA, EOMI, no pallor, no icterus Cardiovascular- normal s1,s2, no murmur, trace leg edema Respiratory- bilateral clear to auscultation, no wheeze, no rhonchi, no crackles, no use of accessory muscles Abdomen- bowel sounds present, soft, non tender Musculoskeletal- able to move all 4 extremities, generalized weakness, has her back brace Neurological- alert and oriented to person, place and time Skin- warm and dry, surgical incision healing well with minimal redness, no drainage Psychiatry- normal mood and affect    Labs reviewed: Basic Metabolic Panel:  Recent Labs  11/06/15 1131 11/27/15  NA 140 146  K 4.2 4.0  CL 110  --   CO2 23  --   GLUCOSE 104*  --   BUN 25* 17  CREATININE 0.73 0.6  CALCIUM 9.8  --    CBC:  Recent Labs  11/06/15 1131 11/27/15  WBC 4.2 5.7  HGB 13.5 10.2*  HCT 40.9 32*  MCV 90.1  --   PLT 173 244    Radiological Exams: Dg Lumbar Spine 2-3 Views  Result Date: 11/14/2015 CLINICAL DATA:  L3-4, L4-5 posterior fusion EXAM: DG C-ARM 61-120 MIN; LUMBAR SPINE - 2-3 VIEW COMPARISON:  10/07/2015 FINDINGS: Two intraoperative spot images demonstrate changes of posterior fusion with posterior pedicle screws from L3 through L5. Normal alignment. No visible hardware complicating feature. IMPRESSION: Posterior fusion L3-L5. Electronically Signed   By: Rolm Baptise M.D.   On: 11/14/2015 14:40   Dg C-arm 1-60 Min  Result Date: 11/14/2015 CLINICAL DATA:  L3-4, L4-5 posterior fusion EXAM: DG C-ARM 61-120 MIN; LUMBAR SPINE - 2-3 VIEW COMPARISON:  10/07/2015 FINDINGS: Two intraoperative spot images demonstrate changes of posterior fusion with posterior pedicle screws from L3 through L5. Normal alignment. No visible hardware  complicating feature. IMPRESSION: Posterior fusion L3-L5. Electronically Signed   By: Rolm Baptise M.D.   On: 11/14/2015 14:40    Assessment/Plan   Unsteady gait Continue to work with physical therapy and occupational therapy team to help with gait training and muscle strengthening exercises.fall precautions.   Lumbar spondylolisthesis with stenosis and radiculopathy  S/P decompression and fusion. Will have patient work with PT/OT as tolerated to regain strength and restore function. Continue Robaxin 500 mg daily as needed for muscle spasm. Has f/u with neurosurgery 01/19/16. Patient advsied to take tylenol if needed for pain.   Constipation Stable on senna s and miralax, monitor  Anemia Post op likely from blood loss. Start ferrous sulfate 325 mg daily for now. Explained about common side effects of the medication. Cbc on follow up visit with PCP recommended.   Hyperlipidemia continue her statin  Dizziness Continue her meclizine  25 mg tid prn for now and monitor   Patient is being discharged with the following home health services:  PT and OT to help with strengthening exercises and gait training, CNA for assistance with her ADLs  Patient is being discharged with the following durable medical equipment:  rollator walker, 3 in 1 bedside commode  Patient has been advised to f/u with their PCP in 1-2 weeks to bring them up to date on their rehab stay.  Social services at facility was responsible for arranging this appointment.  Patient was provided with a 30 day supply of prescriptions for medications and refills must be obtained from their PCP.  For controlled substances, a more limited supply may be provided adequate until PCP appointment only.   Family/ staff Communication: reviewed care plan with patient and nursing supervisor    Blanchie Serve, MD Internal Medicine [place], [place] [address] Cell Phone  (Monday-Friday 8 am - 5 pm): [phone] On Call: [phone] and follow prompts after 5 pm and on weekends Office Phone: [phone] Office Fax: [phone]

## 2015-12-05 ENCOUNTER — Other Ambulatory Visit: Payer: Self-pay

## 2015-12-05 MED ORDER — OXYCODONE-ACETAMINOPHEN 5-325 MG PO TABS: 1.0000 | ORAL_TABLET | Freq: Four times a day (QID) | ORAL | 0 refills | Status: DC | PRN

## 2015-12-05 NOTE — Telephone Encounter (Signed)
Rx faxed to [facility] @ [phone], phone number [phone]  

## 2015-12-07 DIAGNOSIS — E785 Hyperlipidemia, unspecified: Secondary | ICD-10-CM | POA: Diagnosis not present

## 2015-12-07 DIAGNOSIS — Z85038 Personal history of other malignant neoplasm of large intestine: Secondary | ICD-10-CM | POA: Diagnosis not present

## 2015-12-07 DIAGNOSIS — H3552 Pigmentary retinal dystrophy: Secondary | ICD-10-CM | POA: Diagnosis not present

## 2015-12-07 DIAGNOSIS — H548 Legal blindness, as defined in USA: Secondary | ICD-10-CM | POA: Diagnosis not present

## 2015-12-07 DIAGNOSIS — Z981 Arthrodesis status: Secondary | ICD-10-CM | POA: Diagnosis not present

## 2015-12-07 DIAGNOSIS — Z4789 Encounter for other orthopedic aftercare: Secondary | ICD-10-CM | POA: Diagnosis not present

## 2015-12-09 DIAGNOSIS — H3552 Pigmentary retinal dystrophy: Secondary | ICD-10-CM | POA: Diagnosis not present

## 2015-12-09 DIAGNOSIS — Z981 Arthrodesis status: Secondary | ICD-10-CM | POA: Diagnosis not present

## 2015-12-09 DIAGNOSIS — H548 Legal blindness, as defined in USA: Secondary | ICD-10-CM | POA: Diagnosis not present

## 2015-12-09 DIAGNOSIS — E785 Hyperlipidemia, unspecified: Secondary | ICD-10-CM | POA: Diagnosis not present

## 2015-12-09 DIAGNOSIS — Z85038 Personal history of other malignant neoplasm of large intestine: Secondary | ICD-10-CM | POA: Diagnosis not present

## 2015-12-09 DIAGNOSIS — Z4789 Encounter for other orthopedic aftercare: Secondary | ICD-10-CM | POA: Diagnosis not present

## 2015-12-15 DIAGNOSIS — Z4789 Encounter for other orthopedic aftercare: Secondary | ICD-10-CM | POA: Diagnosis not present

## 2015-12-15 DIAGNOSIS — H3552 Pigmentary retinal dystrophy: Secondary | ICD-10-CM | POA: Diagnosis not present

## 2015-12-15 DIAGNOSIS — Z85038 Personal history of other malignant neoplasm of large intestine: Secondary | ICD-10-CM | POA: Diagnosis not present

## 2015-12-15 DIAGNOSIS — E785 Hyperlipidemia, unspecified: Secondary | ICD-10-CM | POA: Diagnosis not present

## 2015-12-15 DIAGNOSIS — H548 Legal blindness, as defined in USA: Secondary | ICD-10-CM | POA: Diagnosis not present

## 2015-12-15 DIAGNOSIS — Z981 Arthrodesis status: Secondary | ICD-10-CM | POA: Diagnosis not present

## 2015-12-17 DIAGNOSIS — K219 Gastro-esophageal reflux disease without esophagitis: Secondary | ICD-10-CM | POA: Diagnosis not present

## 2015-12-17 DIAGNOSIS — E784 Other hyperlipidemia: Secondary | ICD-10-CM | POA: Diagnosis not present

## 2015-12-17 DIAGNOSIS — D649 Anemia, unspecified: Secondary | ICD-10-CM | POA: Diagnosis not present

## 2015-12-17 DIAGNOSIS — M4806 Spinal stenosis, lumbar region: Secondary | ICD-10-CM | POA: Diagnosis not present

## 2015-12-17 DIAGNOSIS — R7301 Impaired fasting glucose: Secondary | ICD-10-CM | POA: Diagnosis not present

## 2015-12-17 DIAGNOSIS — F419 Anxiety disorder, unspecified: Secondary | ICD-10-CM | POA: Diagnosis not present

## 2015-12-17 DIAGNOSIS — I10 Essential (primary) hypertension: Secondary | ICD-10-CM | POA: Diagnosis not present

## 2015-12-22 DIAGNOSIS — E785 Hyperlipidemia, unspecified: Secondary | ICD-10-CM | POA: Diagnosis not present

## 2015-12-22 DIAGNOSIS — H3552 Pigmentary retinal dystrophy: Secondary | ICD-10-CM | POA: Diagnosis not present

## 2015-12-22 DIAGNOSIS — Z85038 Personal history of other malignant neoplasm of large intestine: Secondary | ICD-10-CM | POA: Diagnosis not present

## 2015-12-22 DIAGNOSIS — H548 Legal blindness, as defined in USA: Secondary | ICD-10-CM | POA: Diagnosis not present

## 2015-12-22 DIAGNOSIS — Z4789 Encounter for other orthopedic aftercare: Secondary | ICD-10-CM | POA: Diagnosis not present

## 2015-12-22 DIAGNOSIS — Z981 Arthrodesis status: Secondary | ICD-10-CM | POA: Diagnosis not present

## 2015-12-30 DIAGNOSIS — Z85038 Personal history of other malignant neoplasm of large intestine: Secondary | ICD-10-CM | POA: Diagnosis not present

## 2015-12-30 DIAGNOSIS — H548 Legal blindness, as defined in USA: Secondary | ICD-10-CM | POA: Diagnosis not present

## 2015-12-30 DIAGNOSIS — Z4789 Encounter for other orthopedic aftercare: Secondary | ICD-10-CM | POA: Diagnosis not present

## 2015-12-30 DIAGNOSIS — H3552 Pigmentary retinal dystrophy: Secondary | ICD-10-CM | POA: Diagnosis not present

## 2015-12-30 DIAGNOSIS — E785 Hyperlipidemia, unspecified: Secondary | ICD-10-CM | POA: Diagnosis not present

## 2015-12-30 DIAGNOSIS — Z981 Arthrodesis status: Secondary | ICD-10-CM | POA: Diagnosis not present

## 2016-01-05 DIAGNOSIS — E785 Hyperlipidemia, unspecified: Secondary | ICD-10-CM | POA: Diagnosis not present

## 2016-01-05 DIAGNOSIS — Z981 Arthrodesis status: Secondary | ICD-10-CM | POA: Diagnosis not present

## 2016-01-05 DIAGNOSIS — Z4789 Encounter for other orthopedic aftercare: Secondary | ICD-10-CM | POA: Diagnosis not present

## 2016-01-05 DIAGNOSIS — H3552 Pigmentary retinal dystrophy: Secondary | ICD-10-CM | POA: Diagnosis not present

## 2016-01-05 DIAGNOSIS — H548 Legal blindness, as defined in USA: Secondary | ICD-10-CM | POA: Diagnosis not present

## 2016-01-05 DIAGNOSIS — Z85038 Personal history of other malignant neoplasm of large intestine: Secondary | ICD-10-CM | POA: Diagnosis not present

## 2016-01-15 DIAGNOSIS — H8110 Benign paroxysmal vertigo, unspecified ear: Secondary | ICD-10-CM | POA: Insufficient documentation

## 2016-01-15 DIAGNOSIS — Z6835 Body mass index (BMI) 35.0-35.9, adult: Secondary | ICD-10-CM | POA: Diagnosis not present

## 2016-01-22 DIAGNOSIS — M4806 Spinal stenosis, lumbar region: Secondary | ICD-10-CM | POA: Diagnosis not present

## 2016-02-02 ENCOUNTER — Encounter: Payer: Self-pay | Admitting: Physical Therapy

## 2016-02-02 ENCOUNTER — Ambulatory Visit: Payer: PPO | Attending: Internal Medicine | Admitting: Physical Therapy

## 2016-02-02 DIAGNOSIS — H8111 Benign paroxysmal vertigo, right ear: Secondary | ICD-10-CM | POA: Diagnosis not present

## 2016-02-02 NOTE — Patient Instructions (Signed)
Benign Positional Vertigo Vertigo is the feeling that you or your surroundings are moving when they are not. Benign positional vertigo is the most common form of vertigo. The cause of this condition is not serious (is benign). This condition is triggered by certain movements and positions (is positional). This condition can be dangerous if it occurs while you are doing something that could endanger you or others, such as driving.  CAUSES In many cases, the cause of this condition is not known. It may be caused by a disturbance in an area of the inner ear that helps your brain to sense movement and balance. This disturbance can be caused by a viral infection (labyrinthitis), head injury, or repetitive motion. RISK FACTORS This condition is more likely to develop in:  Women.  People who are 50 years of age or older. SYMPTOMS Symptoms of this condition usually happen when you move your head or your eyes in different directions. Symptoms may start suddenly, and they usually last for less than a minute. Symptoms may include:  Loss of balance and falling.  Feeling like you are spinning or moving.  Feeling like your surroundings are spinning or moving.  Nausea and vomiting.  Blurred vision.  Dizziness.  Involuntary eye movement (nystagmus). Symptoms can be mild and cause only slight annoyance, or they can be severe and interfere with daily life. Episodes of benign positional vertigo may return (recur) over time, and they may be triggered by certain movements. Symptoms may improve over time. DIAGNOSIS This condition is usually diagnosed by medical history and a physical exam of the head, neck, and ears. You may be referred to a health care provider who specializes in ear, nose, and throat (ENT) problems (otolaryngologist) or a provider who specializes in disorders of the nervous system (neurologist). You may have additional testing, including:  MRI.  A CT scan.  Eye movement tests. Your  health care provider may ask you to change positions quickly while he or she watches you for symptoms of benign positional vertigo, such as nystagmus. Eye movement may be tested with an electronystagmogram (ENG), caloric stimulation, the Dix-Hallpike test, or the roll test.  An electroencephalogram (EEG). This records electrical activity in your brain.  Hearing tests. TREATMENT Usually, your health care provider will treat this by moving your head in specific positions to adjust your inner ear back to normal. Surgery may be needed in severe cases, but this is rare. In some cases, benign positional vertigo may resolve on its own in 2-4 weeks. HOME CARE INSTRUCTIONS Safety  Move slowly.Avoid sudden body or head movements.  Avoid driving.  Avoid operating heavy machinery.  Avoid doing any tasks that would be dangerous to you or others if a vertigo episode would occur.  If you have trouble walking or keeping your balance, try using a cane for stability. If you feel dizzy or unstable, sit down right away.  Return to your normal activities as told by your health care provider. Ask your health care provider what activities are safe for you. General Instructions  Take over-the-counter and prescription medicines only as told by your health care provider.  Avoid certain positions or movements as told by your health care provider.  Drink enough fluid to keep your urine clear or pale yellow.  Keep all follow-up visits as told by your health care provider. This is important. SEEK MEDICAL CARE IF:  You have a fever.  Your condition gets worse or you develop new symptoms.  Your family or friends   notice any behavioral changes.  Your nausea or vomiting gets worse.  You have numbness or a "pins and needles" sensation. SEEK IMMEDIATE MEDICAL CARE IF:  You have difficulty speaking or moving.  You are always dizzy.  You faint.  You develop severe headaches.  You have weakness in your  legs or arms.  You have changes in your hearing or vision.  You develop a stiff neck.  You develop sensitivity to light.   This information is not intended to replace advice given to you by your health care provider. Make sure you discuss any questions you have with your health care provider.   Document Released: 02/01/2006 Document Revised: 01/15/2015 Document Reviewed: 08/19/2014 Elsevier Interactive Patient Education 2016 [employer]  

## 2016-02-02 NOTE — Therapy (Signed)
[facility] [address] [place], [place], [address] Phone: [phone]   Fax:  [phone]  Physical Therapy Evaluation  Patient Details  Name: Kathryn Romero MRN: [id number] Date of Birth: 11/29/1935 Referring Provider: Dr. Prince Solian  Encounter Date: 02/02/2016      PT End of Session - 02/02/16 1053    Visit Number 1   Number of Visits 4   Date for PT Re-Evaluation 03/03/16   Authorization Type [employer]   Authorization Time Period 02-02-16 - 04-02-16   PT Start Time 0825   PT Stop Time 0924   PT Time Calculation (min) 59 min      Past Medical History:  Diagnosis Date  . ANEMIA-NOS   . ANXIETY   . Arthritis   . Cancer [facility]) 2005   colon cancer  . Chronic back pain    spondylolisthesis  . History of blood transfusion    no abnormal reaction noted  . History of shingles   . HYPERLIPIDEMIA    takes Pravastatin daily  . Legally blind   . Nocturia   . RETINITIS PIGMENTOSA   . SYNDROME, CARPAL TUNNEL   . Weakness    numbness and tingling in legs and feet    Past Surgical History:  Procedure Laterality Date  . ABDOMINAL HYSTERECTOMY  1989  . APPENDECTOMY  1946  . carapl tunnel release Left   . cataract surgery Bilateral   . COLONOSCOPY    . MAXIMUM ACCESS (MAS)POSTERIOR LUMBAR INTERBODY FUSION (PLIF) 2 LEVEL N/A 11/14/2015   Procedure: Lumbar Four-Five Maximum access posterior lumbar interbody fusion, Lumbar Three-Four Lumbar Four-Five Posterolateral Fusion and Pedicle Screws;  Surgeon: Erline Levine, MD;  Location: [facility];  Service: Neurosurgery;  Laterality: N/A;  L3-4 L4-5 Maximum access posterior lumbar interbody fusion  . OOPHORECTOMY  1989  . s/p ganglion cyst  1973  . s/p right hemicolectomy  12 yrs ago    There were no vitals filed for this visit.       Subjective Assessment - 02/02/16 1037    Subjective Pt reports she has had vertigo since she had back surgery  in July 2017 - states initially it was constant but now is more intermittent, however, yesterday was "not a good day"; reports vertigo when she looks down and when she turns over onto R side   Patient Stated Goals resolve the vertigo   Currently in Pain? No/denies            [facility] PT Assessment - 02/02/16 0827      Assessment   Medical Diagnosis BPPV   Referring Provider Dr. Prince Solian   Onset Date/Surgical Date 11/14/15   Prior Therapy pt was discharged to nursing home upon hospital D/C     Precautions   Precautions Fall     Restrictions   Weight Bearing Restrictions No     Balance Screen   Has the patient fallen in the past 6 months Yes   How many times? 2   Has the patient had a decrease in activity level because of a fear of falling?  Yes   Is the patient reluctant to leave their home because of a fear of falling?  Yes     Prior Function   Level of Independence Independent            Vestibular Assessment - 02/02/16 1041      Vestibular Assessment   General Observation Pt is a 80 year old  lady with c/o spinning vertigo since having had back surgery on 11-14-15; pt states she gets room spinning vertigo with lying down and with turning over onto R side in bed; states it occurs intermittently but says yesterday was not a good day     Symptom Behavior   Type of Dizziness Spinning   Frequency of Dizziness intermittently - depends on the movement   Duration of Dizziness seconds   Aggravating Factors Rolling to right;Looking up to the ceiling;Lying supine   Relieving Factors Rest     Occulomotor Exam   Occulomotor Alignment Normal   Smooth Pursuits Intact   Saccades Intact     Positional Testing   Dix-Hallpike Dix-Hallpike Right;Dix-Hallpike Left   Sidelying Test Sidelying Right;Sidelying Left     Dix-Hallpike Right   Dix-Hallpike Right Duration approx. 10 secs   Dix-Hallpike Right Symptoms Upbeat, right rotatory nystagmus     Dix-Hallpike Left    Dix-Hallpike Left Duration none   Dix-Hallpike Left Symptoms No nystagmus     Sidelying Right   Sidelying Right Duration seconds   Sidelying Right Symptoms Upbeat, right rotatory nystagmus     Sidelying Left   Sidelying Left Duration none   Sidelying Left Symptoms No nystagmus      Epley maneuver performed 3 reps for R BPPV  Self Care; instructed pt in Brandt-Daroff exercises for habituation; emailed pt BPPV info due to pt unable to see small print from [place]                 PT Education - 02/02/16 1047    Education provided Yes   Education Details Brandt-Daroff exs for habituation prn:  emailed BPPV info to pt so that she will be able to enlarge it for reading   Person(s) Educated Patient   Methods Explanation;Handout   Comprehension Verbalized understanding;Returned demonstration             PT Long Term Goals - 02/02/16 1317      PT LONG TERM GOAL #1   Title Pt will have (-) R Dix-Hallpike test to indicate resolution of R BPPV.  (03-03-16)   Time 4   Period Weeks   Status New     PT LONG TERM GOAL #2   Title Pt will report no vertigo with bed mobility or any transitional movements.  (03-03-16)   Time 4   Period Weeks   Status New     PT LONG TERM GOAL #3   Title Independent in Brandt-Daroff exs for habituation of R BPPV.  (03-03-16)   Time 4   Period Weeks   Status New               Plan - 02/02/16 1054    Clinical Impression Statement Pt is a 80 year old lady with signs and symptoms consistent with R BPPV as evidenced by (+) R Dix-Hallpike test with R rotary upbeating nystagmus and c/o vertigo in test position.  Symptoms almost fully resolved on 3rd rep of Epley's maneuver for R posterior canalithiasis.  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  Rehab Potential Good   PT Frequency 1x / week   PT Duration 3 weeks   PT Treatment/Interventions ADLs/Self Care Home Management;Canalith Repostioning;Gait training;Therapeutic activities;Therapeutic exercise;Balance training;Neuromuscular re-education;Patient/family education;Vestibular   PT Next Visit Plan recheck R Dix-Hallpike - treat as indicated for BPPV   PT Home Exercise Plan Brandt-Daroff for habituation   Consulted and Agree with Plan of Care Patient      Patient will benefit from skilled therapeutic intervention in order to improve the following deficits and impairments:  Dizziness  Visit Diagnosis: BPPV (benign paroxysmal positional vertigo), right - Plan: PT plan of care cert/re-cert      G-Codes - [id number] 15-Aug-2003    Functional Assessment Tool Used (+) R Dix-Hallpike with nystagmus and c/o vertigo   Functional Limitation Changing and maintaining body position   Changing and Maintaining Body Position Current Status [id number]) At least 40 percent but less than 60 percent impaired, limited or restricted   Changing and Maintaining Body Position Goal Status [id number]) At least 1 percent but less than 20 percent impaired, limited or restricted       Problem List Patient Active Problem List   Diagnosis Date Noted  . Spondylolisthesis at L4-L5 level 11/14/2015  . DOE (dyspnea on exertion) 10/14/2014  . Fatigue 09/19/2014  . Obese 05/20/2014  . RETINITIS PIGMENTOSA 12/04/2008  . GLUCOSE INTOLERANCE 05/09/2007  . Dyslipidemia 05/09/2007  . ANEMIA-NOS 05/09/2007  . ANXIETY 05/09/2007  . Essential hypertension 05/09/2007  . DIVERTICULOSIS, COLON 05/09/2007  . BACK PAIN 05/09/2007  . History of malignant neoplasm of large intestine 05/09/2007  . ARTHRITIS, TRAUMATIC, UNSPECIFIED SITE 12/17/2006    Reene Harlacher, [place], PT 02/02/2016, 8:10 PM  [facility] [address] [place], [place], [address] Phone:  [phone]   Fax:  [phone]  Name: Kathryn Romero MRN: [id number] Date of Birth: 1935-07-08

## 2016-02-06 ENCOUNTER — Ambulatory Visit: Payer: PPO | Admitting: Physical Therapy

## 2016-02-06 DIAGNOSIS — H8111 Benign paroxysmal vertigo, right ear: Secondary | ICD-10-CM | POA: Diagnosis not present

## 2016-02-06 NOTE — Therapy (Signed)
[facility] [address] [place], [place], [address] Phone: [phone]   Fax:  [phone]  Physical Therapy Treatment  Patient Details  Name: Kathryn Romero MRN: [id number] Date of Birth: [birth year] Referring Provider: Dr. Prince Solian  Encounter Date: 02/06/2016      PT End of Session - 02/06/16 1428    Visit Number 2   Number of Visits 4   Date for PT Re-Evaluation 03/03/16   Authorization Type [employer]   Authorization Time Period 02-02-16 - 04-02-16   PT Start Time 1102   PT Stop Time 1150   PT Time Calculation (min) 48 min      Past Medical History:  Diagnosis Date  . ANEMIA-NOS   . ANXIETY   . Arthritis   . Cancer [facility]) 2005   colon cancer  . Chronic back pain    spondylolisthesis  . History of blood transfusion    no abnormal reaction noted  . History of shingles   . HYPERLIPIDEMIA    takes Pravastatin daily  . Legally blind   . Nocturia   . RETINITIS PIGMENTOSA   . SYNDROME, CARPAL TUNNEL   . Weakness    numbness and tingling in legs and feet    Past Surgical History:  Procedure Laterality Date  . ABDOMINAL HYSTERECTOMY  1989  . APPENDECTOMY  1946  . carapl tunnel release Left   . cataract surgery Bilateral   . COLONOSCOPY    . MAXIMUM ACCESS (MAS)POSTERIOR LUMBAR INTERBODY FUSION (PLIF) 2 LEVEL N/A 11/14/2015   Procedure: Lumbar Four-Five Maximum access posterior lumbar interbody fusion, Lumbar Three-Four Lumbar Four-Five Posterolateral Fusion and Pedicle Screws;  Surgeon: Erline Levine, MD;  Location: [facility];  Service: Neurosurgery;  Laterality: N/A;  L3-4 L4-5 Maximum access posterior lumbar interbody fusion  . OOPHORECTOMY  1989  . s/p ganglion cyst  1973  . s/p right hemicolectomy  12 yrs ago    There were no vitals filed for this visit.      Subjective Assessment - 02/06/16 1425    Subjective Pt reports she had no vertigo after treatment on Monday until  yesterday when she sat up after getting a "Bowen treatment for her hip and neck" - was very dizzy and disoriented and has been very off balance since that time yesterday   Patient Stated Goals resolve the vertigo   Currently in Pain? No/denies      NeuroRe-ed: (-) R and L Dix-Hallpike tests with no c/o vertigo and no nystagmus in test positions (-) R and L sidelying tests with no nystagmus and no c/o vertigo in test positions but c/o disorientation with return to upright position  Pt instructed in x1 viewing in seated position - horizontal - - pt able to perform approx. 15 secs prior to c/o feeling nauseaus, so ex was stopped  Habituation for c/o dizziness with pt transferring R sidelying to sitting 5 times, then 4 times L sidelying to sitting   TherEx: Manual distraction (cervical) performed with pt supine PROM for cervical rotation and lateral flexion Pt performed 5 reps isometrics for R cervical rotation and R lateral flexion - 5 sec. Hold Mulligan's stretch for R and L cervical rotation - 30 sec hold    Pt reported she felt some better at end of session compared to beginning of session                        PT Education -  02/06/16 1427    Education provided Yes   Education Details instructed pt to use moist heat, do Mulligan's stretch for neck and instructed in gaze stabilization in seated position   Person(s) Educated Patient   Methods Explanation   Comprehension Verbalized understanding;Returned demonstration             PT Long Term Goals - 02/02/16 1317      PT LONG TERM GOAL #1   Title Pt will have (-) R Dix-Hallpike test to indicate resolution of R BPPV.  (03-03-16)   Time 4   Period Weeks   Status New     PT LONG TERM GOAL #2   Title Pt will report no vertigo with bed mobility or any transitional movements.  (03-03-16)   Time 4   Period Weeks   Status New     PT LONG TERM GOAL #3   Title Independent in Brandt-Daroff exs for  habituation of R BPPV.  (03-03-16)   Time 4   Period Weeks   Status New               Plan - 02/06/16 1429    Clinical Impression Statement Pt has no signs consistent with BPPV at this time - appears to be related to cervicogenic vertigo as pt reports visual disturbance at end of R cervical rotation; no c/o dizziness with L cerivcal rotation; pt also c/o dysequilibrium with return to upright position from sidelying   Rehab Potential Good   PT Frequency 2x / week   PT Duration 3 weeks   PT Treatment/Interventions ADLs/Self Care Home Management;Canalith Repostioning;Gait training;Therapeutic activities;Therapeutic exercise;Balance training;Neuromuscular re-education;Patient/family education;Vestibular   PT Next Visit Plan recheck gaze stabilization; assess vertigo/R cervical rotation   PT Home Exercise Plan Brandt-Daroff for habituation; added Mulligan's stretch with towel and gaze stabilization in seated position   Consulted and Agree with Plan of Care Patient      Patient will benefit from skilled therapeutic intervention in order to improve the following deficits and impairments:  Dizziness  Visit Diagnosis: BPPV (benign paroxysmal positional vertigo), right     Problem List Patient Active Problem List   Diagnosis Date Noted  . Spondylolisthesis at L4-L5 level 11/14/2015  . DOE (dyspnea on exertion) 10/14/2014  . Fatigue 09/19/2014  . Obese 05/20/2014  . RETINITIS PIGMENTOSA 12/04/2008  . GLUCOSE INTOLERANCE 05/09/2007  . Dyslipidemia 05/09/2007  . ANEMIA-NOS 05/09/2007  . ANXIETY 05/09/2007  . Essential hypertension 05/09/2007  . DIVERTICULOSIS, COLON 05/09/2007  . BACK PAIN 05/09/2007  . History of malignant neoplasm of large intestine 05/09/2007  . ARTHRITIS, TRAUMATIC, UNSPECIFIED SITE 12/17/2006    Bannon Giammarco, [place], PT 02/06/2016, 2:34 PM  [facility] [address] [place], [place],  [address] Phone: [phone]   Fax:  [phone]  Name: Kathryn Romero MRN: [id number] Date of Birth: 04/09/1936

## 2016-02-06 NOTE — Patient Instructions (Signed)
Gaze Stabilization: Sitting    Keeping eyes on target on wall \\_10N  feet away, tilt head down 15-30 and move head side to side for _60___ seconds. Repeat while moving head up and down for _60___ seconds. Do __3-5__ sessions per day. Repeat using target on pattern background.  Copyright  [employer]. All rights reserved.

## 2016-02-09 ENCOUNTER — Encounter: Payer: PPO | Admitting: Physical Therapy

## 2016-02-09 ENCOUNTER — Ambulatory Visit: Payer: PPO | Attending: Internal Medicine | Admitting: Physical Therapy

## 2016-02-09 VITALS — BP 154/82 | HR 79

## 2016-02-09 DIAGNOSIS — H8111 Benign paroxysmal vertigo, right ear: Secondary | ICD-10-CM | POA: Diagnosis not present

## 2016-02-10 NOTE — Therapy (Signed)
[facility] [address] [place], [place], [address] Phone: [phone]   Fax:  [phone]  Physical Therapy Treatment  Patient Details  Name: Kathryn Romero MRN: [id number] Date of Birth: 1936-03-23 Referring Provider: Dr. Prince Solian  Encounter Date: 02/09/2016      PT End of Session - 02/10/16 1310    Visit Number 3   Number of Visits 4   Date for PT Re-Evaluation 03/03/16   Authorization Type [employer]   Authorization Time Period 02-02-16 - 04-02-16   PT Start Time 1446   PT Stop Time 1535   PT Time Calculation (min) 49 min      Past Medical History:  Diagnosis Date  . ANEMIA-NOS   . ANXIETY   . Arthritis   . Cancer [facility]) 2005   colon cancer  . Chronic back pain    spondylolisthesis  . History of blood transfusion    no abnormal reaction noted  . History of shingles   . HYPERLIPIDEMIA    takes Pravastatin daily  . Legally blind   . Nocturia   . RETINITIS PIGMENTOSA   . SYNDROME, CARPAL TUNNEL   . Weakness    numbness and tingling in legs and feet    Past Surgical History:  Procedure Laterality Date  . ABDOMINAL HYSTERECTOMY  1989  . APPENDECTOMY  1946  . carapl tunnel release Left   . cataract surgery Bilateral   . COLONOSCOPY    . MAXIMUM ACCESS (MAS)POSTERIOR LUMBAR INTERBODY FUSION (PLIF) 2 LEVEL N/A 11/14/2015   Procedure: Lumbar Four-Five Maximum access posterior lumbar interbody fusion, Lumbar Three-Four Lumbar Four-Five Posterolateral Fusion and Pedicle Screws;  Surgeon: Erline Levine, MD;  Location: [facility];  Service: Neurosurgery;  Laterality: N/A;  L3-4 L4-5 Maximum access posterior lumbar interbody fusion  . OOPHORECTOMY  1989  . s/p ganglion cyst  1973  . s/p right hemicolectomy  12 yrs ago    Vitals:   02/09/16 1510 02/09/16 1552  BP: (!) 171/74 (!) 154/82  Pulse: 79         Subjective Assessment - 02/10/16 1300    Subjective Pt reports she did   stretches for her neck and used moist heat but reports no significant improvement in dizziness noted; states she bent down to get something out of refridgerator and became dizzy with returning to upright; was very unsteady and had LOB - almost fell; pt reports this new onset of dysequilibrium is making her very fearful to move due to fear of falling   Pertinent History spondylolisthesis lumbar region - spine surgery (fusion) in July 2017; retinitis pigmentosa   Patient Stated Goals resolve the vertigo   Currently in Pain? Yes   Pain Location Back   Pain Orientation Lower   Pain Descriptors / Indicators Aching   Pain Type Acute pain   Pain Radiating Towards pt reports pain in low back, moving up into thoracic region at times   Pain Onset More than a month ago       Pt using RW for assistance with ambulation today - states she is more unsteady - has had LOB at home and a near fall -- Pt was using cane for assistance with amb. On 02-02-16 and on 02-05-16 --  pt states she feels the vertigo is worse - states it is not spinning as it was on 02-02-16 but is more unsteadiness and feels as if room is moving sideways   Neuro Re-ed:  Positional testing  for BPPV;  R and L Dix-Hallpike tests (-) with no nystagmus observed in room light in test position Pt did report moderate to severe vertigo with return to upright sitting position from Dix-Hallpike from both R and L sides See blood pressure readings above  Sit to R sidelying - pt reported vertigo in R sidelying position with head movement - as with turning head to look up at ceiling or down  At floor - however, no nystagmus observed in this position Sit to L sidelying position - no c/o vertigo and no nystagmus observed in test position  Sit to stand - pt reported vertigo with this transfer; pt is unable to bend down toward floor and return to upright due to dizziness provoked with  Return to upright - pt has significant fear of falling due to  unsteadiness/LOB experienced at home earlier in day with this movement  Blood pressure recorded throughout session - in different positions;  Full orthostatic assessment not completed due to time constraint - focus of session was on positional testing to try to determine if pt was having A re-occurrence of BPPV        Etiology of "dizziness"/dysequilibrium/visual changes at this time do not appear to be of vestibular system dysfunction - no signs of BPPV noted during today's session: Symptoms appear to be of BP fluctuations more so today - symptoms appeared to be of cervicogenic vertigo on 02-05-16 but not as consistent during today's session                      PT Long Term Goals - 02/02/16 1317      PT LONG TERM GOAL #1   Title Pt will have (-) R Dix-Hallpike test to indicate resolution of R BPPV.  (03-03-16)   Time 4   Period Weeks   Status New     PT LONG TERM GOAL #2   Title Pt will report no vertigo with bed mobility or any transitional movements.  (03-03-16)   Time 4   Period Weeks   Status New     PT LONG TERM GOAL #3   Title Independent in Brandt-Daroff exs for habituation of R BPPV.  (03-03-16)   Time 4   Period Weeks   Status New               Plan - 02/10/16 1316    Clinical Impression Statement --      Patient will benefit from skilled therapeutic intervention in order to improve the following deficits and impairments:  Dizziness, Decreased balance, Difficulty walking  Visit Diagnosis: BPPV (benign paroxysmal positional vertigo), right     Problem List Patient Active Problem List   Diagnosis Date Noted  . Spondylolisthesis at L4-L5 level 11/14/2015  . DOE (dyspnea on exertion) 10/14/2014  . Fatigue 09/19/2014  . Obese 05/20/2014  . RETINITIS PIGMENTOSA 12/04/2008  . GLUCOSE INTOLERANCE 05/09/2007  . Dyslipidemia 05/09/2007  . ANEMIA-NOS 05/09/2007  . ANXIETY 05/09/2007  . Essential hypertension 05/09/2007  .  DIVERTICULOSIS, COLON 05/09/2007  . BACK PAIN 05/09/2007  . History of malignant neoplasm of large intestine 05/09/2007  . ARTHRITIS, TRAUMATIC, UNSPECIFIED SITE 12/17/2006    Kathryn Romero, Kathryn Romero, PT 02/10/2016, 1:28 PM  [facility] [address] [place], [place], [address] Phone: [phone]   Fax:  [phone]  Name: Kathryn Romero MRN: [id number] Date of Birth: September 04, 1935

## 2016-02-19 ENCOUNTER — Ambulatory Visit: Payer: PPO | Admitting: Physical Therapy

## 2016-02-20 ENCOUNTER — Ambulatory Visit: Payer: PPO | Admitting: Rehabilitative and Restorative Service Providers"

## 2016-03-11 ENCOUNTER — Encounter (INDEPENDENT_AMBULATORY_CARE_PROVIDER_SITE_OTHER): Payer: PPO | Admitting: Ophthalmology

## 2016-03-11 DIAGNOSIS — H43813 Vitreous degeneration, bilateral: Secondary | ICD-10-CM

## 2016-03-11 DIAGNOSIS — H3552 Pigmentary retinal dystrophy: Secondary | ICD-10-CM

## 2016-03-16 DIAGNOSIS — M545 Low back pain: Secondary | ICD-10-CM | POA: Diagnosis not present

## 2016-03-16 DIAGNOSIS — M4316 Spondylolisthesis, lumbar region: Secondary | ICD-10-CM | POA: Diagnosis not present

## 2016-03-16 DIAGNOSIS — M5416 Radiculopathy, lumbar region: Secondary | ICD-10-CM | POA: Diagnosis not present

## 2016-03-16 DIAGNOSIS — I10 Essential (primary) hypertension: Secondary | ICD-10-CM | POA: Diagnosis not present

## 2016-03-22 DIAGNOSIS — H543 Unqualified visual loss, both eyes: Secondary | ICD-10-CM | POA: Diagnosis not present

## 2016-03-22 DIAGNOSIS — M5416 Radiculopathy, lumbar region: Secondary | ICD-10-CM | POA: Diagnosis not present

## 2016-03-22 DIAGNOSIS — M4316 Spondylolisthesis, lumbar region: Secondary | ICD-10-CM | POA: Diagnosis not present

## 2016-03-22 DIAGNOSIS — H3552 Pigmentary retinal dystrophy: Secondary | ICD-10-CM | POA: Diagnosis not present

## 2016-03-22 DIAGNOSIS — Z981 Arthrodesis status: Secondary | ICD-10-CM | POA: Diagnosis not present

## 2016-03-22 DIAGNOSIS — I10 Essential (primary) hypertension: Secondary | ICD-10-CM | POA: Diagnosis not present

## 2016-03-22 DIAGNOSIS — M545 Low back pain: Secondary | ICD-10-CM | POA: Diagnosis not present

## 2016-03-25 DIAGNOSIS — M545 Low back pain: Secondary | ICD-10-CM | POA: Diagnosis not present

## 2016-03-25 DIAGNOSIS — H543 Unqualified visual loss, both eyes: Secondary | ICD-10-CM | POA: Diagnosis not present

## 2016-03-25 DIAGNOSIS — I10 Essential (primary) hypertension: Secondary | ICD-10-CM | POA: Diagnosis not present

## 2016-03-25 DIAGNOSIS — H3552 Pigmentary retinal dystrophy: Secondary | ICD-10-CM | POA: Diagnosis not present

## 2016-03-25 DIAGNOSIS — Z981 Arthrodesis status: Secondary | ICD-10-CM | POA: Diagnosis not present

## 2016-03-25 DIAGNOSIS — M4316 Spondylolisthesis, lumbar region: Secondary | ICD-10-CM | POA: Diagnosis not present

## 2016-03-25 DIAGNOSIS — M5416 Radiculopathy, lumbar region: Secondary | ICD-10-CM | POA: Diagnosis not present

## 2016-03-29 ENCOUNTER — Telehealth (INDEPENDENT_AMBULATORY_CARE_PROVIDER_SITE_OTHER): Payer: Self-pay | Admitting: Orthopaedic Surgery

## 2016-03-29 NOTE — Telephone Encounter (Signed)
Can you please tell her she needs to be seen by her surgeon for this issues first.

## 2016-03-29 NOTE — Telephone Encounter (Signed)
I called the patient to tell her she needs to be seen by her surgeon regarding the symptoms she described and she said she saw him about a week ago (around November 7th) and an xray was performed. She was told there was nothing wrong seen regarding her back. The surgeon thinks the pain comes from her hip. Patient says after she had surgery she never had back pain. Prior to the surgery she was told she has back issues but she may also have problems with her hip. Patient also states the pain is moreso in the area that's directly above buttocks, rt hip, groin area and at times it feels as if her legs "don't want to support her standing." Please call patient if necessary.  Pt's ph# [phone] Thank you.

## 2016-03-29 NOTE — Telephone Encounter (Signed)
Kathryn Romero currently has a New Patient appointment with Dr. Ninfa Linden on December 4th but called to see if she can come in sooner. She had back surgery in July and said she's in extreme pain that goes across her back into her right hip and rt groin. She said it hurts to walk, stand in place, stand after sitting for a while, bending, etc. She'd like a phone call regarding this and being seen sooner if possible.  Pt's ph# [phone] Thank you.

## 2016-03-30 NOTE — Telephone Encounter (Signed)
See below. Do you agree? She ok to see you or does she need to see her surgeon like I told her?

## 2016-03-30 NOTE — Telephone Encounter (Signed)
I guess we can see her again to re-evaluate her hip, but not her back.

## 2016-03-31 NOTE — Telephone Encounter (Signed)
Would you mind giving her a call letting her know, that's fine we will see her for her HIP ONLY. But I really don't have anything before Dec 4

## 2016-04-12 ENCOUNTER — Ambulatory Visit (INDEPENDENT_AMBULATORY_CARE_PROVIDER_SITE_OTHER): Payer: PPO | Admitting: Orthopaedic Surgery

## 2016-04-12 ENCOUNTER — Ambulatory Visit (INDEPENDENT_AMBULATORY_CARE_PROVIDER_SITE_OTHER): Payer: PPO

## 2016-04-12 DIAGNOSIS — M25551 Pain in right hip: Secondary | ICD-10-CM

## 2016-04-12 NOTE — Progress Notes (Signed)
Office Visit Note   Patient: Kathryn Romero           Date of Birth: June 27, 1935           MRN: [id number] Visit Date: 04/12/2016              Requested by: Prince Solian, MD [address] [place], [place] [address] PCP: Tivis Ringer, MD   Assessment & Plan: Visit Diagnoses:  1. Pain in right hip     Plan: At this point I do not feel this is issue about her hips is much is with her back. She sees Dr. Vertell Limber this Wednesday. I told her to let him know that I don't think this is a hip issue at all. I would like to see her back in a month to see how she is doing overall to see if she would benefit from any type of injections.  Follow-Up Instructions: Return in about 4 weeks (around 05/10/2016).   Orders:  Orders Placed This Encounter  Procedures  . XR HIP UNILAT W OR W/O PELVIS 1V RIGHT   No orders of the defined types were placed in this encounter.     Procedures: No procedures performed   Clinical Data: No additional findings.   Subjective: Chief Complaint  Patient presents with  . Right Hip - Pain    Patient states she has hx of back surgery but for the last 3 months they told her it may be her hip.    She is a history of L3-L5 back fusion by Dr. Vertell Limber here in town back in July of this year. Since then she's had trouble getting around. She said she has more pain when she walks and feels like her legs do want to support her. She denies any significant pain in the groin at all either side. She will be evaluated by me since I have operated on her friend of hers. She will make sure there was is not a hip issue. HPI  Review of Systems Negative for shortness breath, headache, chest pain, fever, chills, nausea, vomiting  Objective: Vital Signs: There were no vitals taken for this visit.  Physical Exam She is alert and oriented 3 in no acute distress Ortho Exam She gets up from a chair very slow. I had her sent back down and examine both hips with her sitting.  Both hips have full range of motion with no pain in the groin at all and no blocks in this motion. She has really having significant pain around either hip at all. When she stands again she points to the low back and upper pelvis as source of her pain. Specialty Comments:  No specialty comments available.  Imaging: Xr Hip Unilat W Or W/o Pelvis 1v Right  Result Date: 04/12/2016 An AP pelvis and lateral of her right hip shows minimal arthritic changes in the hip. The hip all still well concentric with minimal osteophytes. There is no significant joint space narrowing at all. There is no acute findings.    PMFS History: Patient Active Problem List   Diagnosis Date Noted  . Spondylolisthesis at L4-L5 level 11/14/2015  . DOE (dyspnea on exertion) 10/14/2014  . Fatigue 09/19/2014  . Obese 05/20/2014  . RETINITIS PIGMENTOSA 12/04/2008  . GLUCOSE INTOLERANCE 05/09/2007  . Dyslipidemia 05/09/2007  . ANEMIA-NOS 05/09/2007  . ANXIETY 05/09/2007  . Essential hypertension 05/09/2007  . DIVERTICULOSIS, COLON 05/09/2007  . BACK PAIN 05/09/2007  . History of malignant neoplasm of large intestine  05/09/2007  . ARTHRITIS, TRAUMATIC, UNSPECIFIED SITE 12/17/2006   Past Medical History:  Diagnosis Date  . ANEMIA-NOS   . ANXIETY   . Arthritis   . Cancer [facility]) 2005   colon cancer  . Chronic back pain    spondylolisthesis  . History of blood transfusion    no abnormal reaction noted  . History of shingles   . HYPERLIPIDEMIA    takes Pravastatin daily  . Legally blind   . Nocturia   . RETINITIS PIGMENTOSA   . SYNDROME, CARPAL TUNNEL   . Weakness    numbness and tingling in legs and feet    Family History  Problem Relation Age of Onset  . ALS Cousin   . Polymyalgia rheumatica Cousin   . Colon cancer Neg Hx     Past Surgical History:  Procedure Laterality Date  . ABDOMINAL HYSTERECTOMY  1989  . APPENDECTOMY  1946  . carapl tunnel release Left   . cataract surgery Bilateral   .  COLONOSCOPY    . MAXIMUM ACCESS (MAS)POSTERIOR LUMBAR INTERBODY FUSION (PLIF) 2 LEVEL N/A 11/14/2015   Procedure: Lumbar Four-Five Maximum access posterior lumbar interbody fusion, Lumbar Three-Four Lumbar Four-Five Posterolateral Fusion and Pedicle Screws;  Surgeon: Erline Levine, MD;  Location: [facility];  Service: Neurosurgery;  Laterality: N/A;  L3-4 L4-5 Maximum access posterior lumbar interbody fusion  . OOPHORECTOMY  1989  . s/p ganglion cyst  1973  . s/p right hemicolectomy  12 yrs ago   Social History   Occupational History  . Not on file.   Social History Main Topics  . Smoking status: Never Smoker  . Smokeless tobacco: Never Used  . Alcohol use No  . Drug use: No  . Sexual activity: Not on file

## 2016-04-14 DIAGNOSIS — M545 Low back pain: Secondary | ICD-10-CM | POA: Diagnosis not present

## 2016-04-14 DIAGNOSIS — H3552 Pigmentary retinal dystrophy: Secondary | ICD-10-CM | POA: Diagnosis not present

## 2016-04-14 DIAGNOSIS — Z6835 Body mass index (BMI) 35.0-35.9, adult: Secondary | ICD-10-CM | POA: Diagnosis not present

## 2016-04-14 DIAGNOSIS — M4316 Spondylolisthesis, lumbar region: Secondary | ICD-10-CM | POA: Diagnosis not present

## 2016-04-22 DIAGNOSIS — M48061 Spinal stenosis, lumbar region without neurogenic claudication: Secondary | ICD-10-CM | POA: Diagnosis not present

## 2016-04-22 DIAGNOSIS — M5416 Radiculopathy, lumbar region: Secondary | ICD-10-CM | POA: Diagnosis not present

## 2016-04-27 DIAGNOSIS — M5416 Radiculopathy, lumbar region: Secondary | ICD-10-CM | POA: Diagnosis not present

## 2016-04-27 DIAGNOSIS — M545 Low back pain: Secondary | ICD-10-CM | POA: Diagnosis not present

## 2016-04-27 DIAGNOSIS — M4316 Spondylolisthesis, lumbar region: Secondary | ICD-10-CM | POA: Diagnosis not present

## 2016-04-27 DIAGNOSIS — M48062 Spinal stenosis, lumbar region with neurogenic claudication: Secondary | ICD-10-CM | POA: Diagnosis not present

## 2016-05-13 ENCOUNTER — Ambulatory Visit (INDEPENDENT_AMBULATORY_CARE_PROVIDER_SITE_OTHER): Payer: PPO | Admitting: Orthopaedic Surgery

## 2016-05-14 DIAGNOSIS — J019 Acute sinusitis, unspecified: Secondary | ICD-10-CM | POA: Diagnosis not present

## 2016-05-14 DIAGNOSIS — Z0000 Encounter for general adult medical examination without abnormal findings: Secondary | ICD-10-CM | POA: Diagnosis not present

## 2016-05-14 DIAGNOSIS — M5416 Radiculopathy, lumbar region: Secondary | ICD-10-CM | POA: Diagnosis not present

## 2016-05-14 DIAGNOSIS — R8299 Other abnormal findings in urine: Secondary | ICD-10-CM | POA: Diagnosis not present

## 2016-05-14 DIAGNOSIS — R05 Cough: Secondary | ICD-10-CM | POA: Diagnosis not present

## 2016-05-14 DIAGNOSIS — I10 Essential (primary) hypertension: Secondary | ICD-10-CM | POA: Diagnosis not present

## 2016-05-14 DIAGNOSIS — Z6835 Body mass index (BMI) 35.0-35.9, adult: Secondary | ICD-10-CM | POA: Diagnosis not present

## 2016-05-14 DIAGNOSIS — E784 Other hyperlipidemia: Secondary | ICD-10-CM | POA: Diagnosis not present

## 2016-06-02 ENCOUNTER — Encounter (HOSPITAL_COMMUNITY): Payer: Self-pay | Admitting: *Deleted

## 2016-06-02 ENCOUNTER — Telehealth: Payer: Self-pay | Admitting: Cardiology

## 2016-06-02 ENCOUNTER — Emergency Department (HOSPITAL_COMMUNITY)
Admission: EM | Admit: 2016-06-02 | Discharge: 2016-06-02 | Disposition: A | Discharge status: Home / Self Care | Payer: PPO | Attending: Emergency Medicine | Admitting: Emergency Medicine

## 2016-06-02 ENCOUNTER — Emergency Department (HOSPITAL_COMMUNITY): Payer: PPO

## 2016-06-02 DIAGNOSIS — R079 Chest pain, unspecified: Secondary | ICD-10-CM

## 2016-06-02 DIAGNOSIS — Z6835 Body mass index (BMI) 35.0-35.9, adult: Secondary | ICD-10-CM | POA: Diagnosis not present

## 2016-06-02 DIAGNOSIS — R072 Precordial pain: Secondary | ICD-10-CM | POA: Insufficient documentation

## 2016-06-02 DIAGNOSIS — Z79899 Other long term (current) drug therapy: Secondary | ICD-10-CM | POA: Insufficient documentation

## 2016-06-02 DIAGNOSIS — I10 Essential (primary) hypertension: Secondary | ICD-10-CM | POA: Insufficient documentation

## 2016-06-02 DIAGNOSIS — R200 Anesthesia of skin: Secondary | ICD-10-CM | POA: Diagnosis not present

## 2016-06-02 DIAGNOSIS — M5416 Radiculopathy, lumbar region: Secondary | ICD-10-CM | POA: Diagnosis not present

## 2016-06-02 DIAGNOSIS — I2510 Atherosclerotic heart disease of native coronary artery without angina pectoris: Secondary | ICD-10-CM | POA: Insufficient documentation

## 2016-06-02 DIAGNOSIS — Z85038 Personal history of other malignant neoplasm of large intestine: Secondary | ICD-10-CM | POA: Diagnosis not present

## 2016-06-02 DIAGNOSIS — M48062 Spinal stenosis, lumbar region with neurogenic claudication: Secondary | ICD-10-CM | POA: Diagnosis not present

## 2016-06-02 DIAGNOSIS — M542 Cervicalgia: Secondary | ICD-10-CM | POA: Diagnosis not present

## 2016-06-02 DIAGNOSIS — R030 Elevated blood-pressure reading, without diagnosis of hypertension: Secondary | ICD-10-CM | POA: Diagnosis not present

## 2016-06-02 LAB — BASIC METABOLIC PANEL
Anion gap: 11 (ref 5–15)
BUN: 19 mg/dL (ref 6–20)
CO2: 25 mmol/L (ref 22–32)
Calcium: 9.7 mg/dL (ref 8.9–10.3)
Chloride: 103 mmol/L (ref 101–111)
Creatinine, Ser: 0.71 mg/dL (ref 0.44–1.00)
GFR calc Af Amer: >60 mL/min (ref >60)
GFR calc non Af Amer: >60 mL/min (ref >60)
Glucose, Bld: 112 mg/dL — ABNORMAL HIGH (ref 65–99)
Potassium: 3.5 mmol/L (ref 3.5–5.1)
Sodium: 139 mmol/L (ref 135–145)

## 2016-06-02 LAB — CBC
HCT: 40.9 % (ref 36.0–46.0)
Hemoglobin: 13.3 g/dL (ref 12.0–15.0)
MCH: 28.8 pg (ref 26.0–34.0)
MCHC: 32.5 g/dL (ref 30.0–36.0)
MCV: 88.5 fL (ref 78.0–100.0)
Platelets: 215 10*3/uL (ref 150–400)
RBC: 4.62 MIL/uL (ref 3.87–5.11)
RDW: 14.3 % (ref 11.5–15.5)
WBC: 5.3 10*3/uL (ref 4.0–10.5)

## 2016-06-02 LAB — I-STAT TROPONIN, ED: Troponin i, poc: 0.01 ng/mL (ref 0.00–0.08)

## 2016-06-02 NOTE — ED Notes (Signed)
Up dated patient on waiting time

## 2016-06-02 NOTE — ED Provider Notes (Signed)
[facility] DEPT Provider Note   CSN: [id number] Arrival date & time: 06/02/16  1330     History   Chief Complaint Chief Complaint  Patient presents with  . Chest Pain    HPI Kathryn Romero is a 81 y.o. female.  Patient with no known coronary artery disease. But for the last 3 nights patient's been having intermittent substernal chest pain lasting about 5 minutes. Never lasting longer. Never occurs during the daytime. Patient scheduled for an epidural procedure under anesthesia. And was referred in by her neurosurgeon for consideration for cardiac clearance or the procedure.      Past Medical History:  Diagnosis Date  . ANEMIA-NOS   . ANXIETY   . Arthritis   . Cancer [facility]) 2005   colon cancer  . Chronic back pain    spondylolisthesis  . History of blood transfusion    no abnormal reaction noted  . History of shingles   . HYPERLIPIDEMIA    takes Pravastatin daily  . Legally blind   . Nocturia   . RETINITIS PIGMENTOSA   . SYNDROME, CARPAL TUNNEL   . Weakness    numbness and tingling in legs and feet    Patient Active Problem List   Diagnosis Date Noted  . Spondylolisthesis at L4-L5 level 11/14/2015  . DOE (dyspnea on exertion) 10/14/2014  . Fatigue 09/19/2014  . Obese 05/20/2014  . RETINITIS PIGMENTOSA 12/04/2008  . GLUCOSE INTOLERANCE 05/09/2007  . Dyslipidemia 05/09/2007  . ANEMIA-NOS 05/09/2007  . ANXIETY 05/09/2007  . Essential hypertension 05/09/2007  . DIVERTICULOSIS, COLON 05/09/2007  . BACK PAIN 05/09/2007  . History of malignant neoplasm of large intestine 05/09/2007  . ARTHRITIS, TRAUMATIC, UNSPECIFIED SITE 12/17/2006    Past Surgical History:  Procedure Laterality Date  . ABDOMINAL HYSTERECTOMY  1989  . APPENDECTOMY  1946  . carapl tunnel release Left   . cataract surgery Bilateral   . COLONOSCOPY    . MAXIMUM ACCESS (MAS)POSTERIOR LUMBAR INTERBODY FUSION (PLIF) 2 LEVEL N/A 11/14/2015   Procedure: Lumbar Four-Five Maximum access  posterior lumbar interbody fusion, Lumbar Three-Four Lumbar Four-Five Posterolateral Fusion and Pedicle Screws;  Surgeon: Erline Levine, MD;  Location: [facility];  Service: Neurosurgery;  Laterality: N/A;  L3-4 L4-5 Maximum access posterior lumbar interbody fusion  . OOPHORECTOMY  1989  . s/p ganglion cyst  1973  . s/p right hemicolectomy  12 yrs ago    OB History    No data available       Home Medications    Prior to Admission medications   Medication Sig Start Date End Date Taking? Authorizing Provider  LUTEIN PO Take 1 capsule by mouth daily.   Yes Historical Provider, MD  Multiple Minerals-Vitamins (BONE DENSITY BUILDER) TABS Take 1 tablet by mouth daily.   Yes Historical Provider, MD  Multiple Vitamin (MULTIVITAMIN WITH MINERALS) TABS tablet Take 1 tablet by mouth daily.   Yes Historical Provider, MD    Family History Family History  Problem Relation Age of Onset  . ALS Cousin   . Polymyalgia rheumatica Cousin   . Colon cancer Neg Hx     Social History Social History  Substance Use Topics  . Smoking status: Never Smoker  . Smokeless tobacco: Never Used  . Alcohol use No     Allergies   Codeine; Hydromet [hydrocodone-homatropine]; Fish allergy; Morphine; and Sulfa antibiotics   Review of Systems Review of Systems  Constitutional: Negative for fever.  HENT: Negative for congestion.   Eyes: Negative for  redness.  Respiratory: Negative for shortness of breath.   Cardiovascular: Positive for chest pain.  Gastrointestinal: Negative for abdominal pain, nausea and vomiting.  Genitourinary: Negative for dysuria.  Musculoskeletal: Positive for back pain.  Neurological: Positive for numbness.  Hematological: Does not bruise/bleed easily.  Psychiatric/Behavioral: Negative for confusion.     Physical Exam Updated Vital Signs BP 128/80 (BP Location: Left Arm)   Pulse 81   Temp 98.6 F (37 C) (Oral)   Resp 16   SpO2 94%   Physical Exam  Constitutional: She  is oriented to person, place, and time. She appears well-developed and well-nourished. No distress.  HENT:  Head: Normocephalic and atraumatic.  Eyes: Conjunctivae and EOM are normal. Pupils are equal, round, and reactive to light.  Neck: Normal range of motion. Neck supple.  Cardiovascular: Normal rate, regular rhythm and normal heart sounds.   Pulmonary/Chest: Effort normal and breath sounds normal. No respiratory distress.  Abdominal: Soft. Bowel sounds are normal. There is no tenderness.  Musculoskeletal: Normal range of motion.  Neurological: She is alert and oriented to person, place, and time. No cranial nerve deficit. She exhibits normal muscle tone. Coordination normal.  Skin: Skin is warm.  Nursing note and vitals reviewed.    ED Treatments / Results  Labs (all labs ordered are listed, but only abnormal results are displayed) Labs Reviewed  BASIC METABOLIC PANEL - Abnormal; Notable for the following:       Result Value   Glucose, Bld 112 (*)    All other components within normal limits  CBC  I-STAT TROPOININ, ED    EKG  EKG Interpretation  Date/Time:  Wednesday June 02 2016 13:41:34 EST Ventricular Rate:  77 PR Interval:  166 QRS Duration: 86 QT Interval:  382 QTC Calculation: 432 R Axis:   -43 Text Interpretation:  Normal sinus rhythm Left axis deviation Cannot rule out Anterior infarct , age undetermined Abnormal ECG Confirmed by Rogene Houston  MD, Jasaun Carn [phone]) on 06/02/2016 4:14:09 PM       Radiology Dg Chest 2 View  Result Date: 06/02/2016 CLINICAL DATA:  Chest pain, weakness EXAM: CHEST  2 VIEW COMPARISON:  None. FINDINGS: The heart size and mediastinal contours are within normal limits. Both lungs are clear. The visualized skeletal structures are unremarkable. IMPRESSION: No active cardiopulmonary disease. Electronically Signed   By: Kathreen Devoid   On: 06/02/2016 13:59    Procedures Procedures (including critical care time)  Medications Ordered in  ED Medications - No data to display   Initial Impression / Assessment and Plan / ED Course  I have reviewed the triage vital signs and the nursing notes.  Pertinent labs & imaging results that were available during my care of the patient were reviewed by me and considered in my medical decision making (see chart for details).     Patient with intermittent substernal chest pain only at night lasting 5 minutes at most for the past 3 days. No other complaints. Patient did have surgery in July without any difficulties no history of any coronary artery disease. However the symptoms are new and different and recommend follow-up with cardiology patient may very well require stress test. Also recommend baby aspirin a day. Patient's troponins normal chest x-ray without any acute findings EKG without any acute changes.   Final Clinical Impressions(s) / ED Diagnoses   Final diagnoses:  Chest pain, unspecified type    New Prescriptions New Prescriptions   No medications on file     [facility],  MD 06/02/16 1751

## 2016-06-02 NOTE — Telephone Encounter (Signed)
Dr. Melven Sartorius nurse unavailable - left message to call back.

## 2016-06-02 NOTE — Telephone Encounter (Signed)
Patient called to schedule office visit today for evaluation of chest pain. Instructed her to go to ED. She agrees with treatment plan.

## 2016-06-02 NOTE — Telephone Encounter (Signed)
Dr. Melven Sartorius office called and reported patient has been woken up with crushing chest pain 3-4 times this week. The nurse states the patient complains of chest pain now.  Instructed her to send patient to ED for treatment of active chest pain. She was grateful for assistance.

## 2016-06-02 NOTE — Telephone Encounter (Signed)
Pt at Dr. Ethel Rana had a crushing chest pain for several nights, want her to be seen today -

## 2016-06-02 NOTE — ED Triage Notes (Signed)
Pt reports chest pain at night for the last 3 nights. Pt states that she is to have an epidural for her back pain this week but her MD wants her heart evaluated prior to that. Denies pain at this time.

## 2016-06-02 NOTE — Discharge Instructions (Signed)
Make appointment to follow-up with cardiology call for appointment tomorrow. Recommend start taking a baby aspirin a day. Return for any new or worse chest pain chest pain lasting 15 minutes or longer. Today's workup without any significant findings. Suspect cardiology wanted to stress test. We are unable to clear you medically from the emergency department to have the surgery.

## 2016-06-03 ENCOUNTER — Ambulatory Visit (INDEPENDENT_AMBULATORY_CARE_PROVIDER_SITE_OTHER): Payer: PPO | Admitting: Interventional Cardiology

## 2016-06-03 ENCOUNTER — Telehealth: Payer: Self-pay | Admitting: Physician Assistant

## 2016-06-03 ENCOUNTER — Encounter: Payer: Self-pay | Admitting: Interventional Cardiology

## 2016-06-03 VITALS — BP 146/74 | HR 69 | Ht 61.0 in | Wt 188.2 lb

## 2016-06-03 DIAGNOSIS — E782 Mixed hyperlipidemia: Secondary | ICD-10-CM | POA: Diagnosis not present

## 2016-06-03 DIAGNOSIS — Z01810 Encounter for preprocedural cardiovascular examination: Secondary | ICD-10-CM | POA: Diagnosis not present

## 2016-06-03 NOTE — Patient Instructions (Signed)
**Note De-identified Shequila Neglia Obfuscation** Medication Instructions:  Same-no changes  Labwork: None  Testing/Procedures: None  Follow-Up: As needed     If you need a refill on your cardiac medications before your next appointment, please call your pharmacy.   

## 2016-06-03 NOTE — Telephone Encounter (Signed)
Patient called and said that she was in Dr. Melven Sartorius office yesterday and that she was having chest pain and a call was sent to the DOD and she was advised to go to the ER. The patient was seen in the ER yesterday and the patient's troponin, CXR, and EKG were normal. The patient is scheduled for an epidural placement under anesthesia tomorrow and neurosurgery is seeking clearance. The ER told her that she needed to be cleared by cardiology and that she may require a stress test. The patient had a normal myocardial perfusion test and echo about one and a half years ago.  The patient is not an established patient.  Dr. Irish Lack (DOD) agreed to see the patient this afternoon. Patient verbalized understanding and was in agreement with this plan. Dr. Melven Sartorius office was called at [phone] to find out what exactly they need from our office for clearance. A voicemail was left for them to return our call.

## 2016-06-03 NOTE — Telephone Encounter (Signed)
**Note De-Identified Kathryn Romero Obfuscation** The pt is scheduled to see Dr Irish Lack today at 2 pm to discuss clearance.

## 2016-06-03 NOTE — Telephone Encounter (Signed)
New message  Pt verbalized that she is calling because she was told to call and get appt with [facility], hospital follow up  And she was told that she is to to have an ECHO done here same day as appt

## 2016-06-03 NOTE — Progress Notes (Signed)
Cardiology Office Note   Date:  06/03/2016   ID:  Kathryn Romero, DOB April 04, 1936, MRN [id number]  PCP:  Tivis Ringer, MD    Chief Complaint  Patient presents with  . New Patient (Initial Visit)     Wt Readings from Last 3 Encounters:  06/03/16 188 lb 3.2 oz (85.4 kg)  12/04/15 182 lb 9.6 oz (82.8 kg)  11/21/15 185 lb 1.6 oz (84 kg)       History of Present Illness: Kathryn Romero is a 81 y.o. female  Who has no documented CAD.  SHe had a negative stress test and a normal EF by echo in 2016.  She has a h/o back problems.  She went to Dr. Melven Sartorius office and had an appointment.  She told him about some brief chest pains she was having occasionally.  No relation to exertion.    She was sent to the ER.  SHe had a negative workup at that time.  She is here for clearance.    She has no chest pain with walking.  She walked to the office form her car without difficulty, except for back pain and leg weakness.         Past Medical History:  Diagnosis Date  . ANEMIA-NOS   . ANXIETY   . Arthritis   . Cancer [facility]) 2005   colon cancer  . Chronic back pain    spondylolisthesis  . History of blood transfusion    no abnormal reaction noted  . History of shingles   . HYPERLIPIDEMIA    takes Pravastatin daily  . Legally blind   . Nocturia   . RETINITIS PIGMENTOSA   . SYNDROME, CARPAL TUNNEL   . Weakness    numbness and tingling in legs and feet    Past Surgical History:  Procedure Laterality Date  . ABDOMINAL HYSTERECTOMY  1989  . APPENDECTOMY  1946  . carapl tunnel release Left   . cataract surgery Bilateral   . COLONOSCOPY    . MAXIMUM ACCESS (MAS)POSTERIOR LUMBAR INTERBODY FUSION (PLIF) 2 LEVEL N/A 11/14/2015   Procedure: Lumbar Four-Five Maximum access posterior lumbar interbody fusion, Lumbar Three-Four Lumbar Four-Five Posterolateral Fusion and Pedicle Screws;  Surgeon: Erline Levine, MD;  Location: [place] [facility];  Service: Neurosurgery;  Laterality: N/A;   L3-4 L4-5 Maximum access posterior lumbar interbody fusion  . OOPHORECTOMY  1989  . s/p ganglion cyst  1973  . s/p right hemicolectomy  12 yrs ago     Current Outpatient Prescriptions  Medication Sig Dispense Refill  . gabapentin (NEURONTIN) 300 MG capsule Take 1 capsule by mouth as directed.    . LUTEIN PO Take 1 capsule by mouth daily.    . Multiple Minerals-Vitamins (BONE DENSITY BUILDER) TABS Take 1 tablet by mouth daily.    . Multiple Vitamin (MULTIVITAMIN WITH MINERALS) TABS tablet Take 1 tablet by mouth daily.     No current facility-administered medications for this visit.     Allergies:   Codeine; Hydromet [hydrocodone-homatropine]; Fish allergy; Morphine; and Sulfa antibiotics    Social History:  The patient  reports that she has never smoked. She has never used smokeless tobacco. She reports that she does not drink alcohol or use drugs.   Family History:  The patient's family history includes ALS in her cousin; Polymyalgia rheumatica in her cousin.    ROS:  Please see the history of present illness.   Otherwise, review of systems are positive for Back pain.  All other systems are reviewed and negative.    PHYSICAL EXAM: VS:  BP (!) 146/74   Pulse 69   Ht 5\' 1"  (1.549 m)   Wt 188 lb 3.2 oz (85.4 kg)   SpO2 97%   BMI 35.56 kg/m  , BMI Body mass index is 35.56 kg/m. GEN: Well nourished, well developed, in no acute distress  HEENT: normal  Neck: no JVD, carotid bruits, or masses Cardiac: RRR; no murmurs, rubs, or gallops,no edema  Respiratory:  clear to auscultation bilaterally, normal work of breathing GI: soft, nontender, nondistended, + BS MS: no deformity or atrophy  Skin: warm and dry, no rash Neuro:  Strength and sensation are intact Psych: euthymic mood, full affect   EKG:   The ekg ordered yesterday demonstrates NSR, no ischemic changes   Recent Labs: 11/27/2015: ALT 24 06/02/2016: BUN 19; Creatinine, Ser 0.71; Hemoglobin 13.3; Platelets 215;  Potassium 3.5; Sodium 139   Lipid Panel    Component Value Date/Time   CHOL 150 11/27/2015   TRIG 132 11/27/2015   HDL 53 11/27/2015   CHOLHDL 4 05/20/2014 0919   VLDL 19.6 05/20/2014 0919   LDLCALC 71 11/27/2015   LDLDIRECT 198.1 08/16/2012 1002     Other studies Reviewed: Additional studies/ records that were reviewed today with results demonstrating: ER records reviewed.   ASSESSMENT AND PLAN:  1. Preoperative evaluation: Negative stress test and normal EF by echo in 2016. Several atypical features to her chest pain. Negative workup in the emergency room. No further cardiac workup needed prior to epidural injection.  2. Continue aggressive risk factor modification. Cholesterol well controlled. Continue lipid-lowering therapy. 3. I will see her back on an as-needed basis.   Current medicines are reviewed at length with the patient today.  The patient concerns regarding her medicines were addressed.  The following changes have been made:  No change  Labs/ tests ordered today include:  No orders of the defined types were placed in this encounter.   Recommend 150 minutes/week of aerobic exercise Low fat, low carb, high fiber diet recommended  Disposition:   FU prn   Signed, Larae Grooms, MD  06/03/2016 2:44 PM    [facility] [place], [place], [place]  [address] Phone: [phone]; Fax: [phone]

## 2016-06-03 NOTE — Telephone Encounter (Signed)
New message    Request for surgical clearance:  1. What type of surgery is being performed?  Epidural steroid injection   When is this surgery scheduled? 06-04-2016 Are there any medications that need to be held prior to surgery and how long? Unknown  2. Name of physician performing surgery? Dr.Hawkins  3. What is your office phone and fax number? [facility]    Fax [phone]

## 2016-06-03 NOTE — Telephone Encounter (Signed)
**Note De-Identified Micalah Cabezas Obfuscation** This message has been faxed to Dr Maryjean Ka at [phone]. I did receive confirmation that the fax was successful.

## 2016-06-03 NOTE — Telephone Encounter (Signed)
No further cardiac testing needed before epidural steroid injection.

## 2016-06-04 DIAGNOSIS — M48062 Spinal stenosis, lumbar region with neurogenic claudication: Secondary | ICD-10-CM | POA: Diagnosis not present

## 2016-06-04 DIAGNOSIS — M5136 Other intervertebral disc degeneration, lumbar region: Secondary | ICD-10-CM | POA: Diagnosis not present

## 2016-06-11 DIAGNOSIS — E784 Other hyperlipidemia: Secondary | ICD-10-CM | POA: Diagnosis not present

## 2016-06-11 DIAGNOSIS — K219 Gastro-esophageal reflux disease without esophagitis: Secondary | ICD-10-CM | POA: Diagnosis not present

## 2016-06-11 DIAGNOSIS — R079 Chest pain, unspecified: Secondary | ICD-10-CM | POA: Diagnosis not present

## 2016-06-11 DIAGNOSIS — Z6835 Body mass index (BMI) 35.0-35.9, adult: Secondary | ICD-10-CM | POA: Diagnosis not present

## 2016-06-11 DIAGNOSIS — I10 Essential (primary) hypertension: Secondary | ICD-10-CM | POA: Diagnosis not present

## 2016-06-11 DIAGNOSIS — F419 Anxiety disorder, unspecified: Secondary | ICD-10-CM | POA: Diagnosis not present

## 2016-06-11 DIAGNOSIS — Z1389 Encounter for screening for other disorder: Secondary | ICD-10-CM | POA: Diagnosis not present

## 2016-06-11 DIAGNOSIS — Z0000 Encounter for general adult medical examination without abnormal findings: Secondary | ICD-10-CM | POA: Diagnosis not present

## 2016-06-11 DIAGNOSIS — H8110 Benign paroxysmal vertigo, unspecified ear: Secondary | ICD-10-CM | POA: Diagnosis not present

## 2016-06-11 DIAGNOSIS — M5416 Radiculopathy, lumbar region: Secondary | ICD-10-CM | POA: Diagnosis not present

## 2016-06-11 DIAGNOSIS — R7301 Impaired fasting glucose: Secondary | ICD-10-CM | POA: Diagnosis not present

## 2016-06-23 DIAGNOSIS — M48062 Spinal stenosis, lumbar region with neurogenic claudication: Secondary | ICD-10-CM | POA: Diagnosis not present

## 2016-06-23 DIAGNOSIS — Z6834 Body mass index (BMI) 34.0-34.9, adult: Secondary | ICD-10-CM | POA: Diagnosis not present

## 2016-06-23 DIAGNOSIS — R030 Elevated blood-pressure reading, without diagnosis of hypertension: Secondary | ICD-10-CM | POA: Diagnosis not present

## 2016-07-12 DIAGNOSIS — M4316 Spondylolisthesis, lumbar region: Secondary | ICD-10-CM | POA: Diagnosis not present

## 2016-07-12 DIAGNOSIS — M48062 Spinal stenosis, lumbar region with neurogenic claudication: Secondary | ICD-10-CM | POA: Diagnosis not present

## 2016-07-12 DIAGNOSIS — M5416 Radiculopathy, lumbar region: Secondary | ICD-10-CM | POA: Diagnosis not present

## 2016-07-12 DIAGNOSIS — M545 Low back pain: Secondary | ICD-10-CM | POA: Diagnosis not present

## 2016-07-29 DIAGNOSIS — H53453 Other localized visual field defect, bilateral: Secondary | ICD-10-CM | POA: Diagnosis not present

## 2016-07-29 DIAGNOSIS — H3552 Pigmentary retinal dystrophy: Secondary | ICD-10-CM | POA: Diagnosis not present

## 2016-07-29 DIAGNOSIS — H5362 Acquired night blindness: Secondary | ICD-10-CM | POA: Diagnosis not present

## 2016-07-29 DIAGNOSIS — H547 Unspecified visual loss: Secondary | ICD-10-CM | POA: Diagnosis not present

## 2016-08-03 DIAGNOSIS — M4316 Spondylolisthesis, lumbar region: Secondary | ICD-10-CM | POA: Diagnosis not present

## 2016-08-03 DIAGNOSIS — M5416 Radiculopathy, lumbar region: Secondary | ICD-10-CM | POA: Diagnosis not present

## 2016-08-03 DIAGNOSIS — M545 Low back pain: Secondary | ICD-10-CM | POA: Diagnosis not present

## 2016-08-25 ENCOUNTER — Other Ambulatory Visit: Payer: Self-pay | Admitting: Internal Medicine

## 2016-08-25 DIAGNOSIS — Z1231 Encounter for screening mammogram for malignant neoplasm of breast: Secondary | ICD-10-CM

## 2016-08-26 DIAGNOSIS — Z6836 Body mass index (BMI) 36.0-36.9, adult: Secondary | ICD-10-CM | POA: Diagnosis not present

## 2016-08-26 DIAGNOSIS — E784 Other hyperlipidemia: Secondary | ICD-10-CM | POA: Diagnosis not present

## 2016-08-26 DIAGNOSIS — M5416 Radiculopathy, lumbar region: Secondary | ICD-10-CM | POA: Diagnosis not present

## 2016-08-26 DIAGNOSIS — R079 Chest pain, unspecified: Secondary | ICD-10-CM | POA: Diagnosis not present

## 2016-08-26 DIAGNOSIS — R05 Cough: Secondary | ICD-10-CM | POA: Diagnosis not present

## 2016-08-26 DIAGNOSIS — N644 Mastodynia: Secondary | ICD-10-CM | POA: Diagnosis not present

## 2016-08-27 ENCOUNTER — Other Ambulatory Visit: Payer: Self-pay | Admitting: Internal Medicine

## 2016-08-27 DIAGNOSIS — N644 Mastodynia: Secondary | ICD-10-CM

## 2016-08-30 ENCOUNTER — Ambulatory Visit
Admission: RE | Admit: 2016-08-30 | Discharge: 2016-08-30 | Disposition: A | Discharge status: Home / Self Care | Payer: PPO | Source: Ambulatory Visit | Attending: Internal Medicine | Admitting: Internal Medicine

## 2016-08-30 ENCOUNTER — Other Ambulatory Visit: Payer: Self-pay | Admitting: Internal Medicine

## 2016-08-30 DIAGNOSIS — N644 Mastodynia: Secondary | ICD-10-CM

## 2016-08-30 DIAGNOSIS — M4316 Spondylolisthesis, lumbar region: Secondary | ICD-10-CM | POA: Diagnosis not present

## 2016-08-30 DIAGNOSIS — M5416 Radiculopathy, lumbar region: Secondary | ICD-10-CM | POA: Diagnosis not present

## 2016-08-30 DIAGNOSIS — M48062 Spinal stenosis, lumbar region with neurogenic claudication: Secondary | ICD-10-CM | POA: Diagnosis not present

## 2016-08-30 DIAGNOSIS — R928 Other abnormal and inconclusive findings on diagnostic imaging of breast: Secondary | ICD-10-CM | POA: Diagnosis not present

## 2016-08-30 DIAGNOSIS — N6489 Other specified disorders of breast: Secondary | ICD-10-CM | POA: Diagnosis not present

## 2016-08-30 DIAGNOSIS — M545 Low back pain: Secondary | ICD-10-CM | POA: Diagnosis not present

## 2016-08-31 ENCOUNTER — Other Ambulatory Visit: Payer: PPO

## 2016-09-13 ENCOUNTER — Ambulatory Visit: Payer: PPO

## 2016-09-30 ENCOUNTER — Other Ambulatory Visit: Payer: Self-pay | Admitting: Neurosurgery

## 2016-09-30 DIAGNOSIS — M5416 Radiculopathy, lumbar region: Secondary | ICD-10-CM

## 2016-10-14 ENCOUNTER — Ambulatory Visit
Admission: RE | Admit: 2016-10-14 | Discharge: 2016-10-14 | Disposition: A | Discharge status: Home / Self Care | Payer: PPO | Source: Ambulatory Visit | Attending: Neurosurgery | Admitting: Neurosurgery

## 2016-10-14 DIAGNOSIS — M5416 Radiculopathy, lumbar region: Secondary | ICD-10-CM

## 2016-10-14 DIAGNOSIS — M48061 Spinal stenosis, lumbar region without neurogenic claudication: Secondary | ICD-10-CM | POA: Diagnosis not present

## 2016-10-14 MED ORDER — GADOBENATE DIMEGLUMINE 529 MG/ML IV SOLN
20.0000 mL | Freq: Once | INTRAVENOUS | Status: AC | PRN
  Administered 2016-10-14: 20 mL via INTRAVENOUS

## 2016-10-20 DIAGNOSIS — M545 Low back pain: Secondary | ICD-10-CM | POA: Diagnosis not present

## 2016-10-20 DIAGNOSIS — M5416 Radiculopathy, lumbar region: Secondary | ICD-10-CM | POA: Diagnosis not present

## 2016-10-20 DIAGNOSIS — M4316 Spondylolisthesis, lumbar region: Secondary | ICD-10-CM | POA: Diagnosis not present

## 2016-10-20 DIAGNOSIS — M48062 Spinal stenosis, lumbar region with neurogenic claudication: Secondary | ICD-10-CM | POA: Diagnosis not present

## 2016-10-21 DIAGNOSIS — M545 Low back pain: Secondary | ICD-10-CM | POA: Diagnosis not present

## 2016-10-29 DIAGNOSIS — M545 Low back pain: Secondary | ICD-10-CM | POA: Diagnosis not present

## 2016-11-02 DIAGNOSIS — M545 Low back pain: Secondary | ICD-10-CM | POA: Diagnosis not present

## 2016-11-04 DIAGNOSIS — M545 Low back pain: Secondary | ICD-10-CM | POA: Diagnosis not present

## 2016-11-15 DIAGNOSIS — M545 Low back pain: Secondary | ICD-10-CM | POA: Diagnosis not present

## 2016-11-17 DIAGNOSIS — E7439 Other disorders of intestinal carbohydrate absorption: Secondary | ICD-10-CM | POA: Diagnosis not present

## 2016-11-17 DIAGNOSIS — Z6836 Body mass index (BMI) 36.0-36.9, adult: Secondary | ICD-10-CM | POA: Diagnosis not present

## 2016-11-17 DIAGNOSIS — K5790 Diverticulosis of intestine, part unspecified, without perforation or abscess without bleeding: Secondary | ICD-10-CM | POA: Diagnosis not present

## 2016-11-17 DIAGNOSIS — F419 Anxiety disorder, unspecified: Secondary | ICD-10-CM | POA: Diagnosis not present

## 2016-11-17 DIAGNOSIS — M5416 Radiculopathy, lumbar region: Secondary | ICD-10-CM | POA: Diagnosis not present

## 2016-11-17 DIAGNOSIS — I10 Essential (primary) hypertension: Secondary | ICD-10-CM | POA: Diagnosis not present

## 2016-11-24 DIAGNOSIS — H04123 Dry eye syndrome of bilateral lacrimal glands: Secondary | ICD-10-CM | POA: Diagnosis not present

## 2016-11-24 DIAGNOSIS — H10413 Chronic giant papillary conjunctivitis, bilateral: Secondary | ICD-10-CM | POA: Diagnosis not present

## 2016-11-24 DIAGNOSIS — H1131 Conjunctival hemorrhage, right eye: Secondary | ICD-10-CM | POA: Diagnosis not present

## 2016-11-26 DIAGNOSIS — M545 Low back pain: Secondary | ICD-10-CM | POA: Diagnosis not present

## 2016-11-29 DIAGNOSIS — M545 Low back pain: Secondary | ICD-10-CM | POA: Diagnosis not present

## 2016-12-03 DIAGNOSIS — M545 Low back pain: Secondary | ICD-10-CM | POA: Diagnosis not present

## 2016-12-07 DIAGNOSIS — M545 Low back pain: Secondary | ICD-10-CM | POA: Diagnosis not present

## 2016-12-09 DIAGNOSIS — D649 Anemia, unspecified: Secondary | ICD-10-CM | POA: Diagnosis not present

## 2016-12-22 DIAGNOSIS — M545 Low back pain: Secondary | ICD-10-CM | POA: Diagnosis not present

## 2016-12-24 DIAGNOSIS — R5383 Other fatigue: Secondary | ICD-10-CM | POA: Diagnosis not present

## 2016-12-24 DIAGNOSIS — R197 Diarrhea, unspecified: Secondary | ICD-10-CM | POA: Diagnosis not present

## 2016-12-24 DIAGNOSIS — R110 Nausea: Secondary | ICD-10-CM | POA: Diagnosis not present

## 2016-12-24 DIAGNOSIS — K529 Noninfective gastroenteritis and colitis, unspecified: Secondary | ICD-10-CM | POA: Diagnosis not present

## 2016-12-24 DIAGNOSIS — R0609 Other forms of dyspnea: Secondary | ICD-10-CM | POA: Diagnosis not present

## 2016-12-24 DIAGNOSIS — Z6835 Body mass index (BMI) 35.0-35.9, adult: Secondary | ICD-10-CM | POA: Diagnosis not present

## 2016-12-27 DIAGNOSIS — M545 Low back pain: Secondary | ICD-10-CM | POA: Diagnosis not present

## 2016-12-29 DIAGNOSIS — R5383 Other fatigue: Secondary | ICD-10-CM | POA: Diagnosis not present

## 2017-01-03 DIAGNOSIS — M545 Low back pain: Secondary | ICD-10-CM | POA: Diagnosis not present

## 2017-01-05 DIAGNOSIS — M545 Low back pain: Secondary | ICD-10-CM | POA: Diagnosis not present

## 2017-01-12 DIAGNOSIS — M545 Low back pain: Secondary | ICD-10-CM | POA: Diagnosis not present

## 2017-01-24 DIAGNOSIS — M545 Low back pain: Secondary | ICD-10-CM | POA: Diagnosis not present

## 2017-01-27 DIAGNOSIS — Z1589 Genetic susceptibility to other disease: Secondary | ICD-10-CM | POA: Diagnosis not present

## 2017-01-27 DIAGNOSIS — H3552 Pigmentary retinal dystrophy: Secondary | ICD-10-CM | POA: Insufficient documentation

## 2017-01-27 DIAGNOSIS — H5362 Acquired night blindness: Secondary | ICD-10-CM | POA: Diagnosis not present

## 2017-01-27 DIAGNOSIS — H547 Unspecified visual loss: Secondary | ICD-10-CM | POA: Diagnosis not present

## 2017-01-27 DIAGNOSIS — H903 Sensorineural hearing loss, bilateral: Secondary | ICD-10-CM | POA: Diagnosis not present

## 2017-01-27 DIAGNOSIS — H53453 Other localized visual field defect, bilateral: Secondary | ICD-10-CM | POA: Diagnosis not present

## 2017-02-09 DIAGNOSIS — M6281 Muscle weakness (generalized): Secondary | ICD-10-CM | POA: Diagnosis not present

## 2017-02-09 DIAGNOSIS — M545 Low back pain: Secondary | ICD-10-CM | POA: Diagnosis not present

## 2017-02-14 DIAGNOSIS — M6281 Muscle weakness (generalized): Secondary | ICD-10-CM | POA: Diagnosis not present

## 2017-02-14 DIAGNOSIS — M545 Low back pain: Secondary | ICD-10-CM | POA: Diagnosis not present

## 2017-02-16 DIAGNOSIS — M545 Low back pain: Secondary | ICD-10-CM | POA: Diagnosis not present

## 2017-02-22 DIAGNOSIS — M4316 Spondylolisthesis, lumbar region: Secondary | ICD-10-CM | POA: Diagnosis not present

## 2017-02-22 DIAGNOSIS — M5416 Radiculopathy, lumbar region: Secondary | ICD-10-CM | POA: Diagnosis not present

## 2017-02-22 DIAGNOSIS — R030 Elevated blood-pressure reading, without diagnosis of hypertension: Secondary | ICD-10-CM | POA: Diagnosis not present

## 2017-02-22 DIAGNOSIS — M545 Low back pain: Secondary | ICD-10-CM | POA: Diagnosis not present

## 2017-02-23 DIAGNOSIS — M545 Low back pain: Secondary | ICD-10-CM | POA: Diagnosis not present

## 2017-02-25 DIAGNOSIS — M545 Low back pain: Secondary | ICD-10-CM | POA: Diagnosis not present

## 2017-02-25 DIAGNOSIS — M6281 Muscle weakness (generalized): Secondary | ICD-10-CM | POA: Diagnosis not present

## 2017-03-02 DIAGNOSIS — M545 Low back pain: Secondary | ICD-10-CM | POA: Diagnosis not present

## 2017-03-14 DIAGNOSIS — M6281 Muscle weakness (generalized): Secondary | ICD-10-CM | POA: Diagnosis not present

## 2017-03-14 DIAGNOSIS — M545 Low back pain: Secondary | ICD-10-CM | POA: Diagnosis not present

## 2017-03-16 DIAGNOSIS — M170 Bilateral primary osteoarthritis of knee: Secondary | ICD-10-CM | POA: Diagnosis not present

## 2017-03-16 DIAGNOSIS — M1711 Unilateral primary osteoarthritis, right knee: Secondary | ICD-10-CM | POA: Diagnosis not present

## 2017-03-16 DIAGNOSIS — M1712 Unilateral primary osteoarthritis, left knee: Secondary | ICD-10-CM | POA: Diagnosis not present

## 2017-03-17 DIAGNOSIS — M545 Low back pain: Secondary | ICD-10-CM | POA: Diagnosis not present

## 2017-03-17 DIAGNOSIS — M48062 Spinal stenosis, lumbar region with neurogenic claudication: Secondary | ICD-10-CM | POA: Diagnosis not present

## 2017-03-17 DIAGNOSIS — Q762 Congenital spondylolisthesis: Secondary | ICD-10-CM | POA: Diagnosis not present

## 2017-03-21 DIAGNOSIS — M6281 Muscle weakness (generalized): Secondary | ICD-10-CM | POA: Diagnosis not present

## 2017-03-21 DIAGNOSIS — M545 Low back pain: Secondary | ICD-10-CM | POA: Diagnosis not present

## 2017-03-28 DIAGNOSIS — M6281 Muscle weakness (generalized): Secondary | ICD-10-CM | POA: Diagnosis not present

## 2017-03-28 DIAGNOSIS — M545 Low back pain: Secondary | ICD-10-CM | POA: Diagnosis not present

## 2017-03-29 DIAGNOSIS — M48062 Spinal stenosis, lumbar region with neurogenic claudication: Secondary | ICD-10-CM | POA: Diagnosis not present

## 2017-04-11 DIAGNOSIS — M545 Low back pain: Secondary | ICD-10-CM | POA: Diagnosis not present

## 2017-04-11 DIAGNOSIS — M6281 Muscle weakness (generalized): Secondary | ICD-10-CM | POA: Diagnosis not present

## 2017-04-26 DIAGNOSIS — M5136 Other intervertebral disc degeneration, lumbar region: Secondary | ICD-10-CM | POA: Diagnosis not present

## 2017-06-09 DIAGNOSIS — D649 Anemia, unspecified: Secondary | ICD-10-CM | POA: Diagnosis not present

## 2017-06-09 DIAGNOSIS — R5383 Other fatigue: Secondary | ICD-10-CM | POA: Diagnosis not present

## 2017-06-09 DIAGNOSIS — R10819 Abdominal tenderness, unspecified site: Secondary | ICD-10-CM | POA: Diagnosis not present

## 2017-06-09 DIAGNOSIS — Z6835 Body mass index (BMI) 35.0-35.9, adult: Secondary | ICD-10-CM | POA: Diagnosis not present

## 2017-06-09 DIAGNOSIS — R05 Cough: Secondary | ICD-10-CM | POA: Diagnosis not present

## 2017-06-14 DIAGNOSIS — M5136 Other intervertebral disc degeneration, lumbar region: Secondary | ICD-10-CM | POA: Diagnosis not present

## 2017-06-20 DIAGNOSIS — M545 Low back pain: Secondary | ICD-10-CM | POA: Diagnosis not present

## 2017-06-20 DIAGNOSIS — M48062 Spinal stenosis, lumbar region with neurogenic claudication: Secondary | ICD-10-CM | POA: Diagnosis not present

## 2017-06-20 DIAGNOSIS — M5416 Radiculopathy, lumbar region: Secondary | ICD-10-CM | POA: Diagnosis not present

## 2017-06-20 DIAGNOSIS — R030 Elevated blood-pressure reading, without diagnosis of hypertension: Secondary | ICD-10-CM | POA: Diagnosis not present

## 2017-06-23 DIAGNOSIS — Z6836 Body mass index (BMI) 36.0-36.9, adult: Secondary | ICD-10-CM | POA: Diagnosis not present

## 2017-06-23 DIAGNOSIS — R10814 Left lower quadrant abdominal tenderness: Secondary | ICD-10-CM | POA: Diagnosis not present

## 2017-06-23 DIAGNOSIS — R10819 Abdominal tenderness, unspecified site: Secondary | ICD-10-CM | POA: Diagnosis not present

## 2017-06-29 DIAGNOSIS — R531 Weakness: Secondary | ICD-10-CM | POA: Diagnosis not present

## 2017-06-29 DIAGNOSIS — R404 Transient alteration of awareness: Secondary | ICD-10-CM | POA: Diagnosis not present

## 2017-07-12 DIAGNOSIS — E7849 Other hyperlipidemia: Secondary | ICD-10-CM | POA: Diagnosis not present

## 2017-07-12 DIAGNOSIS — R82998 Other abnormal findings in urine: Secondary | ICD-10-CM | POA: Diagnosis not present

## 2017-07-12 DIAGNOSIS — E7439 Other disorders of intestinal carbohydrate absorption: Secondary | ICD-10-CM | POA: Diagnosis not present

## 2017-07-12 DIAGNOSIS — Z0000 Encounter for general adult medical examination without abnormal findings: Secondary | ICD-10-CM | POA: Diagnosis not present

## 2017-07-12 DIAGNOSIS — I10 Essential (primary) hypertension: Secondary | ICD-10-CM | POA: Diagnosis not present

## 2017-07-18 DIAGNOSIS — I10 Essential (primary) hypertension: Secondary | ICD-10-CM | POA: Diagnosis not present

## 2017-07-18 DIAGNOSIS — M5416 Radiculopathy, lumbar region: Secondary | ICD-10-CM | POA: Diagnosis not present

## 2017-07-18 DIAGNOSIS — D649 Anemia, unspecified: Secondary | ICD-10-CM | POA: Diagnosis not present

## 2017-07-18 DIAGNOSIS — F418 Other specified anxiety disorders: Secondary | ICD-10-CM | POA: Diagnosis not present

## 2017-07-18 DIAGNOSIS — E7849 Other hyperlipidemia: Secondary | ICD-10-CM | POA: Diagnosis not present

## 2017-07-18 DIAGNOSIS — Z0000 Encounter for general adult medical examination without abnormal findings: Secondary | ICD-10-CM | POA: Diagnosis not present

## 2017-07-18 DIAGNOSIS — H8110 Benign paroxysmal vertigo, unspecified ear: Secondary | ICD-10-CM | POA: Diagnosis not present

## 2017-07-18 DIAGNOSIS — Z6836 Body mass index (BMI) 36.0-36.9, adult: Secondary | ICD-10-CM | POA: Diagnosis not present

## 2017-07-18 DIAGNOSIS — K219 Gastro-esophageal reflux disease without esophagitis: Secondary | ICD-10-CM | POA: Diagnosis not present

## 2017-07-18 DIAGNOSIS — Z1389 Encounter for screening for other disorder: Secondary | ICD-10-CM | POA: Diagnosis not present

## 2017-07-18 DIAGNOSIS — J3089 Other allergic rhinitis: Secondary | ICD-10-CM | POA: Diagnosis not present

## 2017-07-18 DIAGNOSIS — E7439 Other disorders of intestinal carbohydrate absorption: Secondary | ICD-10-CM | POA: Diagnosis not present

## 2017-08-11 DIAGNOSIS — H53453 Other localized visual field defect, bilateral: Secondary | ICD-10-CM | POA: Diagnosis not present

## 2017-08-11 DIAGNOSIS — H547 Unspecified visual loss: Secondary | ICD-10-CM | POA: Diagnosis not present

## 2017-08-11 DIAGNOSIS — Z1589 Genetic susceptibility to other disease: Secondary | ICD-10-CM | POA: Diagnosis not present

## 2017-08-11 DIAGNOSIS — H3552 Pigmentary retinal dystrophy: Secondary | ICD-10-CM | POA: Diagnosis not present

## 2017-08-18 ENCOUNTER — Ambulatory Visit: Payer: PPO

## 2017-08-22 DIAGNOSIS — M545 Low back pain: Secondary | ICD-10-CM | POA: Diagnosis not present

## 2017-08-22 DIAGNOSIS — M79604 Pain in right leg: Secondary | ICD-10-CM | POA: Diagnosis not present

## 2017-08-22 DIAGNOSIS — M79605 Pain in left leg: Secondary | ICD-10-CM | POA: Diagnosis not present

## 2017-08-22 DIAGNOSIS — R2689 Other abnormalities of gait and mobility: Secondary | ICD-10-CM | POA: Diagnosis not present

## 2017-08-23 IMAGING — RF DG C-ARM 61-120 MIN
1 series · 2 of 2 positions shown · non-contrast
Comparison: 10/07/2015

CLINICAL DATA: L3-4, L4-5 posterior fusion

EXAM:
DG C-ARM 61-120 MIN; LUMBAR SPINE - 2-3 VIEW

[Series 1: run · 2 of 2 slices shown]
[im 1/2]
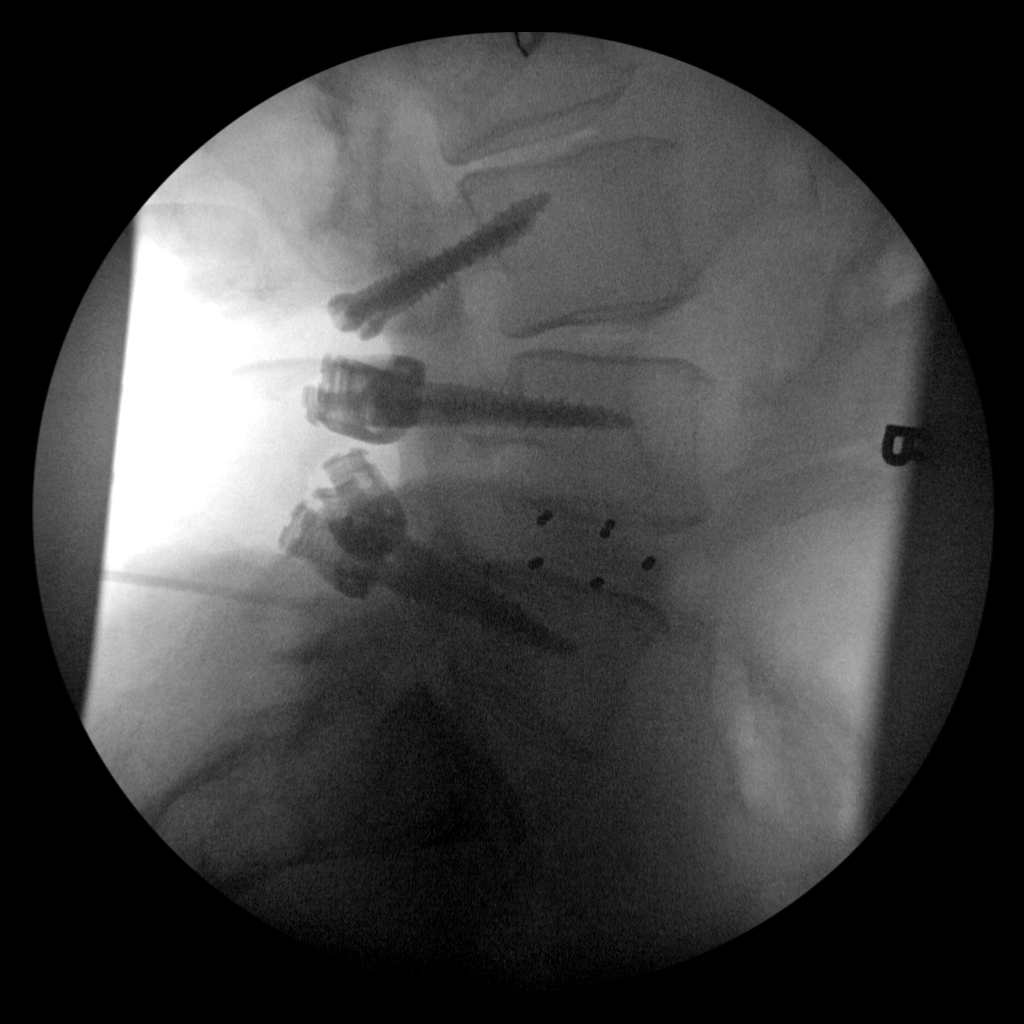
[im 2/2]
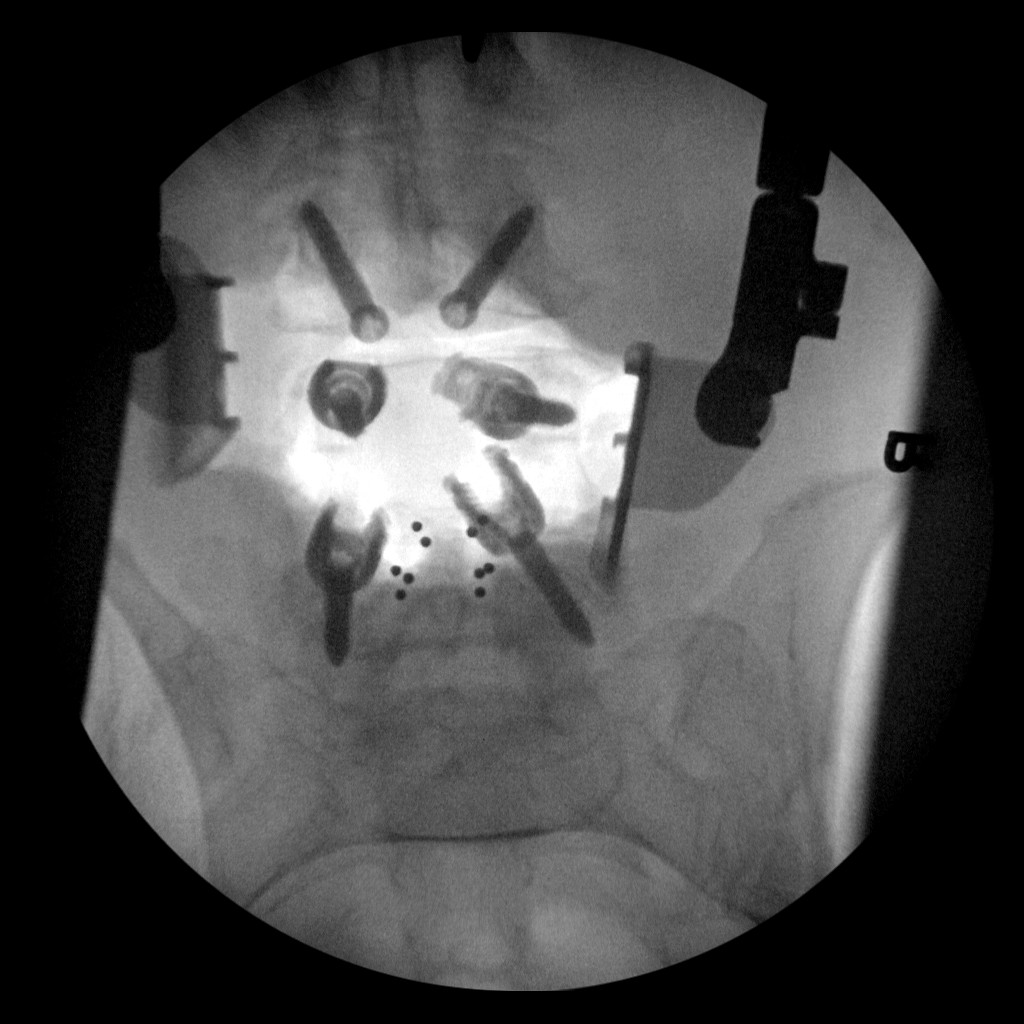

[2 of 2 positions shown; findings below may reference images not displayed]

FINDINGS: Two intraoperative spot images demonstrate changes of posterior
fusion with posterior pedicle screws from L3 through L5. Normal
alignment. No visible hardware complicating feature.
IMPRESSION: Posterior fusion L3-L5.

## 2017-08-31 ENCOUNTER — Other Ambulatory Visit (INDEPENDENT_AMBULATORY_CARE_PROVIDER_SITE_OTHER): Payer: PPO

## 2017-08-31 ENCOUNTER — Encounter (INDEPENDENT_AMBULATORY_CARE_PROVIDER_SITE_OTHER): Payer: Self-pay

## 2017-08-31 ENCOUNTER — Ambulatory Visit (INDEPENDENT_AMBULATORY_CARE_PROVIDER_SITE_OTHER): Payer: PPO | Admitting: Internal Medicine

## 2017-08-31 ENCOUNTER — Encounter: Payer: Self-pay | Admitting: Internal Medicine

## 2017-08-31 VITALS — BP 120/78 | HR 70 | Ht 61.0 in | Wt 190.5 lb

## 2017-08-31 DIAGNOSIS — R1030 Lower abdominal pain, unspecified: Secondary | ICD-10-CM

## 2017-08-31 DIAGNOSIS — R102 Pelvic and perineal pain: Secondary | ICD-10-CM

## 2017-08-31 LAB — BASIC METABOLIC PANEL
BUN: 18 mg/dL (ref 6–23)
CO2: 29 mEq/L (ref 19–32)
Calcium: 9.7 mg/dL (ref 8.4–10.5)
Chloride: 104 mEq/L (ref 96–112)
Creatinine, Ser: 0.63 mg/dL (ref 0.40–1.20)
GFR: 96.74 mL/min (ref >60.00)
Glucose, Bld: 95 mg/dL (ref 70–99)
Potassium: 3.8 mEq/L (ref 3.5–5.1)
Sodium: 140 mEq/L (ref 135–145)

## 2017-08-31 NOTE — Progress Notes (Signed)
HISTORY OF PRESENT ILLNESS:  Kathryn Romero is a 82 y.o. female with a history of right colon cancer in 2004 for which she is status post right hemicolectomy. She presents today with chief complaint of lower abdominal pain. She has had multiple surveillance colonoscopies. The last examination July 2015 was negative for neoplasia. Moderate diverticulosis. The current complaint is a 1 month history of left lower quadrant and occasionally right lower quadrant pain. Described as sharp and intermittent. No obvious exacerbating or relieving factors such as meals, change in position, or bowel movements. Her bowel habits have been unchanged. She denies rectal bleeding. She has had significant problems with back pain. She's also had weight gain which concerns her. She worries about recurrent cancer. The pain can be quite severe at time but last for very short periods of time. No urinary symptoms. No fevers. She did have CT scan of the abdomen and pelvis in 2012 which was negative.  REVIEW OF SYSTEMS:  All non-GI ROS negative unless otherwise stated in the history of present illness except for back pain, unsteadiness, visual problems, anxiety, arthritis, weakness  Past Medical History:  Diagnosis Date  . ANEMIA-NOS   . ANXIETY   . Arthritis   . Cancer [facility]) 2005   colon cancer  . Chronic back pain    spondylolisthesis  . History of blood transfusion    no abnormal reaction noted  . History of shingles   . HYPERLIPIDEMIA    takes Pravastatin daily  . Legally blind   . Nocturia   . RETINITIS PIGMENTOSA   . SYNDROME, CARPAL TUNNEL   . Weakness    numbness and tingling in legs and feet    Past Surgical History:  Procedure Laterality Date  . ABDOMINAL HYSTERECTOMY  1989  . APPENDECTOMY  1946  . carapl tunnel release Left   . cataract surgery Bilateral   . COLONOSCOPY    . MAXIMUM ACCESS (MAS)POSTERIOR LUMBAR INTERBODY FUSION (PLIF) 2 LEVEL N/A 11/14/2015   Procedure: Lumbar Four-Five  Maximum access posterior lumbar interbody fusion, Lumbar Three-Four Lumbar Four-Five Posterolateral Fusion and Pedicle Screws;  Surgeon: Erline Levine, MD;  Location: [place] NEUR[facility] ORS;  Service: Neurosurgery;  Laterality: N/A;  L3-4 L4-5 Maximum access posterior lumbar interbody fusion  . OOPHORECTOMY  1989  . s/p ganglion cyst  1973  . s/p right hemicolectomy  12 yrs ago    Social History Sowmya G Sutphen  reports that she has never smoked. She has never used smokeless tobacco. She reports that she does not drink alcohol or use drugs.  family history includes ALS in her cousin; Breast cancer in her maternal aunt; Polymyalgia rheumatica in her cousin.  Allergies  Allergen Reactions  . Codeine Shortness Of Breath  . Hydromet [Hydrocodone-Homatropine] Shortness Of Breath and Other (See Comments)    Wheezing  . Fish Allergy Other (See Comments)    Burning Sensation and Headache  . Morphine Other (See Comments)    "feels funny"  . Sulfa Antibiotics Nausea And Vomiting            PHYSICAL EXAMINATION: Vital signs: BP 120/78   Pulse 70   Ht 5\' 1"  (1.549 m)   Wt 190 lb 8 oz (86.4 kg)   BMI 35.99 kg/m   Constitutional: generally well-appearing, no acute distress Psychiatric: alert and oriented x3, cooperative Eyes: extraocular movements intact, anicteric, conjunctiva pink Mouth: oral pharynx moist, no lesions Neck: supple no lymphadenopathy Cardiovascular: heart regular rate and rhythm, no murmur Lungs: clear to  auscultation bilaterally Abdomen: soft,obese, nontender, nondistended, no obvious ascites, no peritoneal signs, normal bowel sounds, no organomegaly Rectal:omitted Extremities: no clubbing, cyanosis, or lower extremity edema bilaterally Skin: no lesions on visible extremities Neuro: No focal deficits. Unsteady gait   ASSESSMENT:  #1. Intermittent lower quadrant pain/pelvic discomfort. Left greater than right. May be related to diverticular spasm. Could be adhesions.  May be referred pain from back issues. Rule out more ominous diagnoses #2. History of right colon cancer 2004. Surveillance up-to-date  PLAN:  #1. Contrast-enhanced CT scan of the abdomen and pelvis to further evaluate new onset persistent lower abdominal and pelvic pain. We'll contact the patient with the results #2. Keep plans for surveillance colonoscopy next year

## 2017-08-31 NOTE — Patient Instructions (Signed)
Your provider has requested that you go to the basement level for lab work before leaving today. Press "B" on the elevator. The lab is located at the first door on the left as you exit the elevator.   You have been scheduled for a CT scan of the abdomen and pelvis at [facility] ([address]---this is in the same building as Press [facility]).   You are scheduled on 09/05/2017 at 10:00am. You should arrive 15 minutes prior to your appointment time for registration. Please follow the written instructions below on the day of your exam:  WARNING: IF YOU ARE ALLERGIC TO IODINE/X-RAY DYE, PLEASE NOTIFY RADIOLOGY IMMEDIATELY AT [phone]! YOU WILL BE GIVEN A 13 HOUR PREMEDICATION PREP.  1) Do not eat or drink anything after 6:00am (4 hours prior to your test) 2) You have been given 2 bottles of oral contrast to drink. The solution may taste               better if refrigerated, but do NOT add ice or any other liquid to this solution. Shake             well before drinking.    Drink 1 bottle of contrast @ 8:00am (2 hours prior to your exam)  Drink 1 bottle of contrast @ 9:00am (1 hour prior to your exam)  You may take any medications as prescribed with a small amount of water except for the following: Metformin, Glucophage, Glucovance, Avandamet, Riomet, Fortamet, Actoplus Met, Janumet, Glumetza or Metaglip. The above medications must be held the day of the exam AND 48 hours after the exam.  The purpose of you drinking the oral contrast is to aid in the visualization of your intestinal tract. The contrast solution may cause some diarrhea. Before your exam is started, you will be given a small amount of fluid to drink. Depending on your individual set of symptoms, you may also receive an intravenous injection of x-ray contrast/dye. Plan on being at [facility] for 30 minutes or long, depending on the type of exam you are having performed.  If you have any questions regarding  your exam or if you need to reschedule, you may call the CT department at [phone] between the hours of 8:00 am and 5:00 pm, Monday-Friday.  ________________________________________________________________________

## 2017-09-01 ENCOUNTER — Telehealth: Payer: Self-pay | Admitting: Internal Medicine

## 2017-09-01 NOTE — Telephone Encounter (Signed)
Pt called inquiring about lab results. °

## 2017-09-01 NOTE — Telephone Encounter (Signed)
Spoke with pt and let her know that her labs were normal. Pt wanted to reschedule her CT scan. Pt given the phone number [phone] to call and reschedule the CT to a date that works for her.

## 2017-09-05 ENCOUNTER — Inpatient Hospital Stay: Admission: RE | Admit: 2017-09-05 | Payer: PPO | Source: Ambulatory Visit

## 2017-09-05 DIAGNOSIS — M79604 Pain in right leg: Secondary | ICD-10-CM | POA: Diagnosis not present

## 2017-09-05 DIAGNOSIS — M79605 Pain in left leg: Secondary | ICD-10-CM | POA: Diagnosis not present

## 2017-09-05 DIAGNOSIS — M545 Low back pain: Secondary | ICD-10-CM | POA: Diagnosis not present

## 2017-09-05 DIAGNOSIS — R2689 Other abnormalities of gait and mobility: Secondary | ICD-10-CM | POA: Diagnosis not present

## 2017-09-06 ENCOUNTER — Telehealth: Payer: Self-pay | Admitting: Internal Medicine

## 2017-09-06 NOTE — Telephone Encounter (Signed)
Patient wants to know if it is okay to wait to reschedule her CT scan till around September. Pt was advised again of the number that she could call to schedule whenever works best for her, but she insists on getting Dr.Perry's opinion on how he feels about her waiting to get this done.

## 2017-09-06 NOTE — Telephone Encounter (Signed)
Pt was seen for lower abd pain last week and a CT of A/P was ordered to evaluate pain. Pt has rescheduled her CT scan but wants to know if Dr. Henrene Pastor thinks it is ok for her to wait until September to have it done. She has had insurance issues. Please advise.

## 2017-09-07 NOTE — Telephone Encounter (Signed)
Spoke with pt and she is aware of Dr. Blanch Media comments.

## 2017-09-07 NOTE — Telephone Encounter (Signed)
Is okay to wait. We can always make it sooner if her problems with discomfort worsen

## 2017-09-09 DIAGNOSIS — M79604 Pain in right leg: Secondary | ICD-10-CM | POA: Diagnosis not present

## 2017-09-09 DIAGNOSIS — M545 Low back pain: Secondary | ICD-10-CM | POA: Diagnosis not present

## 2017-09-09 DIAGNOSIS — M79605 Pain in left leg: Secondary | ICD-10-CM | POA: Diagnosis not present

## 2017-09-09 DIAGNOSIS — R2689 Other abnormalities of gait and mobility: Secondary | ICD-10-CM | POA: Diagnosis not present

## 2017-09-12 DIAGNOSIS — M5416 Radiculopathy, lumbar region: Secondary | ICD-10-CM | POA: Diagnosis not present

## 2017-09-12 DIAGNOSIS — R030 Elevated blood-pressure reading, without diagnosis of hypertension: Secondary | ICD-10-CM | POA: Diagnosis not present

## 2017-09-12 DIAGNOSIS — M79605 Pain in left leg: Secondary | ICD-10-CM | POA: Diagnosis not present

## 2017-09-12 DIAGNOSIS — M545 Low back pain: Secondary | ICD-10-CM | POA: Diagnosis not present

## 2017-09-12 DIAGNOSIS — M48062 Spinal stenosis, lumbar region with neurogenic claudication: Secondary | ICD-10-CM | POA: Diagnosis not present

## 2017-09-12 DIAGNOSIS — M79604 Pain in right leg: Secondary | ICD-10-CM | POA: Diagnosis not present

## 2017-09-12 DIAGNOSIS — R2689 Other abnormalities of gait and mobility: Secondary | ICD-10-CM | POA: Diagnosis not present

## 2017-09-14 ENCOUNTER — Telehealth: Payer: Self-pay | Admitting: Internal Medicine

## 2017-09-14 NOTE — Telephone Encounter (Signed)
Pt CT rescheduled to 09/23/17@4pm  at [place]. Pt to be npo after 12noon, drink bottle 1 of contrast at 2pm, bottle 2 at 3pm. Pt to arrive there at 3:45pm. Pt aware of appt.

## 2017-09-14 NOTE — Telephone Encounter (Signed)
Pt would like to schedule Ct scan, pls call her.

## 2017-09-16 DIAGNOSIS — M5416 Radiculopathy, lumbar region: Secondary | ICD-10-CM | POA: Diagnosis not present

## 2017-09-20 DIAGNOSIS — M48062 Spinal stenosis, lumbar region with neurogenic claudication: Secondary | ICD-10-CM | POA: Diagnosis not present

## 2017-09-20 DIAGNOSIS — M5126 Other intervertebral disc displacement, lumbar region: Secondary | ICD-10-CM | POA: Diagnosis not present

## 2017-09-20 DIAGNOSIS — M48061 Spinal stenosis, lumbar region without neurogenic claudication: Secondary | ICD-10-CM | POA: Diagnosis not present

## 2017-09-22 ENCOUNTER — Other Ambulatory Visit: Payer: PPO

## 2017-09-23 ENCOUNTER — Ambulatory Visit (INDEPENDENT_AMBULATORY_CARE_PROVIDER_SITE_OTHER)
Admission: RE | Admit: 2017-09-23 | Discharge: 2017-09-23 | Disposition: A | Discharge status: Home / Self Care | Payer: PPO | Source: Ambulatory Visit | Attending: Internal Medicine | Admitting: Internal Medicine

## 2017-09-23 DIAGNOSIS — R1030 Lower abdominal pain, unspecified: Secondary | ICD-10-CM

## 2017-09-23 DIAGNOSIS — R102 Pelvic and perineal pain: Secondary | ICD-10-CM

## 2017-09-23 MED ORDER — IOPAMIDOL (ISOVUE-300) INJECTION 61%
100.0000 mL | Freq: Once | INTRAVENOUS | Status: AC | PRN
  Administered 2017-09-23: 100 mL via INTRAVENOUS

## 2017-09-26 ENCOUNTER — Other Ambulatory Visit: Payer: Self-pay

## 2017-09-26 MED ORDER — CIPROFLOXACIN HCL 500 MG PO TABS: 500.0000 mg | ORAL_TABLET | Freq: Two times a day (BID) | ORAL | 0 refills | Status: DC

## 2017-09-29 DIAGNOSIS — M48062 Spinal stenosis, lumbar region with neurogenic claudication: Secondary | ICD-10-CM | POA: Diagnosis not present

## 2017-09-29 DIAGNOSIS — M5416 Radiculopathy, lumbar region: Secondary | ICD-10-CM | POA: Diagnosis not present

## 2017-09-29 DIAGNOSIS — M545 Low back pain: Secondary | ICD-10-CM | POA: Diagnosis not present

## 2017-09-29 DIAGNOSIS — M4316 Spondylolisthesis, lumbar region: Secondary | ICD-10-CM | POA: Diagnosis not present

## 2017-11-01 ENCOUNTER — Other Ambulatory Visit: Payer: Self-pay | Admitting: Neurosurgery

## 2017-12-07 DIAGNOSIS — R5383 Other fatigue: Secondary | ICD-10-CM | POA: Diagnosis not present

## 2017-12-07 DIAGNOSIS — Z1321 Encounter for screening for nutritional disorder: Secondary | ICD-10-CM | POA: Diagnosis not present

## 2017-12-07 DIAGNOSIS — M5416 Radiculopathy, lumbar region: Secondary | ICD-10-CM | POA: Diagnosis not present

## 2017-12-07 DIAGNOSIS — R58 Hemorrhage, not elsewhere classified: Secondary | ICD-10-CM | POA: Diagnosis not present

## 2017-12-07 DIAGNOSIS — I10 Essential (primary) hypertension: Secondary | ICD-10-CM | POA: Diagnosis not present

## 2017-12-14 DIAGNOSIS — M545 Low back pain: Secondary | ICD-10-CM | POA: Diagnosis not present

## 2017-12-14 DIAGNOSIS — M4316 Spondylolisthesis, lumbar region: Secondary | ICD-10-CM | POA: Diagnosis not present

## 2017-12-14 DIAGNOSIS — M48062 Spinal stenosis, lumbar region with neurogenic claudication: Secondary | ICD-10-CM | POA: Diagnosis not present

## 2017-12-28 NOTE — Progress Notes (Addendum)
PCP: Prince Solian, MD  Cardiologist: Larae Grooms, MD  EKG: requested-pt unsure if done in the past year  Stress test:11/05/14 in Epic and requested  ECHO: 11/05/14 and requested-pt unsure what studies she has had but all done at Dr. Hassell Done office  Cardiac Cath: pt denies  Chest x-ray: pt denies past year, no recent respiroatory complications/infections

## 2017-12-28 NOTE — Pre-Procedure Instructions (Signed)
Kaisyn G Shinn  12/28/2017      [facility] [place], [address] [place] [address] Phone: [phone] Fax: [phone]  [facility] - [place], [place] - [address] AT [address] [place] [address] Phone: [phone] Fax: [phone]  [facility] [place], [address] [place] [address] Phone: [phone] Fax: [phone]    Your procedure is scheduled on January 06, 2018.  Report to [facility] Admitting at 530 AM.  Call this number if you have problems the morning of surgery:  [phone]   Remember:  Do not eat or drink after midnight.    Take these medicines the morning of surgery with A SIP OF WATER (none)   7 days prior to surgery STOP taking any meloxicam (mobic), Aspirin (unless otherwise instructed by your surgeon), Aleve, Naproxen, Ibuprofen, Motrin, Advil, Goody's, BC's, all herbal medications, fish oil, and all vitamins  Contacts, dentures or bridgework may not be worn into surgery.  Leave your suitcase in the car.  After surgery it may be brought to your room.  For patients admitted to the hospital, discharge time will be determined by your treatment team.  Patients discharged the day of surgery will not be allowed to drive home.   [facility]- Preparing For Surgery  Before surgery, you can play an important role. Because skin is not sterile, your skin needs to be as free of germs as possible. You can reduce the number of germs on your skin by washing with CHG (chlorahexidine gluconate) Soap before surgery.  CHG is an antiseptic cleaner which kills germs and bonds with the skin to continue killing germs even after washing.    Oral Hygiene is also important to reduce your risk of infection.  Remember - BRUSH YOUR TEETH THE MORNING OF SURGERY WITH YOUR  REGULAR TOOTHPASTE  Please do not use if you have an allergy to CHG or antibacterial soaps. If your skin becomes reddened/irritated stop using the CHG.  Do not shave (including legs and underarms) for at least 48 hours prior to first CHG shower. It is OK to shave your face.  Please follow these instructions carefully.   1. Shower the NIGHT BEFORE SURGERY and the MORNING OF SURGERY with CHG.   2. If you chose to wash your hair, wash your hair first as usual with your normal shampoo.  3. After you shampoo, rinse your hair and body thoroughly to remove the shampoo.  4. Use CHG as you would any other liquid soap. You can apply CHG directly to the skin and wash gently with a scrungie or a clean washcloth.   5. Apply the CHG Soap to your body ONLY FROM THE NECK DOWN.  Do not use on open wounds or open sores. Avoid contact with your eyes, ears, mouth and genitals (private parts). Wash Face and genitals (private parts)  with your normal soap.  6. Wash thoroughly, paying special attention to the area where your surgery will be performed.  7. Thoroughly rinse your body with warm water from the neck down.  8. DO NOT shower/wash with your normal soap after using and rinsing off the CHG Soap.  9. Pat yourself dry with a CLEAN TOWEL.  10. Wear CLEAN PAJAMAS to bed the night before surgery, wear comfortable clothes the morning of  surgery  11. Place CLEAN SHEETS on your bed the night of your first shower and DO NOT SLEEP WITH PETS.  Day of Surgery:  Do not apply any deodorants/lotions.  Please wear clean clothes to the hospital/surgery center.               Remember to brush your teeth WITH YOUR REGULAR TOOTHPASTE.              Do not wear jewelry, make-up or nail polish.  Do not wear lotions, powders, or perfumes, or deodorant.  Do not shave 48 hours prior to surgery.    Do not bring valuables to the hospital.  [facility] is not responsible for any belongings or valuables.    Please read  over the following fact sheets that you were given.

## 2017-12-29 ENCOUNTER — Encounter (HOSPITAL_COMMUNITY): Payer: Self-pay

## 2017-12-29 ENCOUNTER — Encounter (HOSPITAL_COMMUNITY)
Admission: RE | Admit: 2017-12-29 | Discharge: 2017-12-29 | Disposition: A | Discharge status: Home / Self Care | Payer: PPO | Source: Ambulatory Visit | Attending: Neurosurgery | Admitting: Neurosurgery

## 2017-12-29 ENCOUNTER — Other Ambulatory Visit: Payer: Self-pay

## 2017-12-29 ENCOUNTER — Other Ambulatory Visit (HOSPITAL_COMMUNITY): Payer: PPO

## 2017-12-29 DIAGNOSIS — Z01812 Encounter for preprocedural laboratory examination: Secondary | ICD-10-CM | POA: Diagnosis not present

## 2017-12-29 DIAGNOSIS — F419 Anxiety disorder, unspecified: Secondary | ICD-10-CM | POA: Diagnosis not present

## 2017-12-29 DIAGNOSIS — D649 Anemia, unspecified: Secondary | ICD-10-CM | POA: Diagnosis not present

## 2017-12-29 DIAGNOSIS — H548 Legal blindness, as defined in USA: Secondary | ICD-10-CM | POA: Insufficient documentation

## 2017-12-29 DIAGNOSIS — Z85038 Personal history of other malignant neoplasm of large intestine: Secondary | ICD-10-CM | POA: Diagnosis not present

## 2017-12-29 DIAGNOSIS — E785 Hyperlipidemia, unspecified: Secondary | ICD-10-CM | POA: Diagnosis not present

## 2017-12-29 DIAGNOSIS — Z981 Arthrodesis status: Secondary | ICD-10-CM | POA: Insufficient documentation

## 2017-12-29 LAB — BASIC METABOLIC PANEL
Anion gap: 9 (ref 5–15)
BUN: 24 mg/dL — ABNORMAL HIGH (ref 8–23)
CO2: 25 mmol/L (ref 22–32)
Calcium: 9.7 mg/dL (ref 8.9–10.3)
Chloride: 106 mmol/L (ref 98–111)
Creatinine, Ser: 0.67 mg/dL (ref 0.44–1.00)
GFR calc Af Amer: >60 mL/min (ref >60)
GFR calc non Af Amer: >60 mL/min (ref >60)
Glucose, Bld: 105 mg/dL — ABNORMAL HIGH (ref 70–99)
Potassium: 3.9 mmol/L (ref 3.5–5.1)
Sodium: 140 mmol/L (ref 135–145)

## 2017-12-29 LAB — TYPE AND SCREEN
ABO/RH(D): A NEG
Antibody Screen: NEGATIVE

## 2017-12-29 LAB — SURGICAL PCR SCREEN
MRSA, PCR: NEGATIVE
Staphylococcus aureus: NEGATIVE

## 2017-12-29 LAB — CBC
HCT: 38.4 % (ref 36.0–46.0)
Hemoglobin: 12.3 g/dL (ref 12.0–15.0)
MCH: 29.7 pg (ref 26.0–34.0)
MCHC: 32 g/dL (ref 30.0–36.0)
MCV: 92.8 fL (ref 78.0–100.0)
Platelets: 201 10*3/uL (ref 150–400)
RBC: 4.14 MIL/uL (ref 3.87–5.11)
RDW: 13.3 % (ref 11.5–15.5)
WBC: 4.6 10*3/uL (ref 4.0–10.5)

## 2017-12-30 NOTE — Progress Notes (Addendum)
Anesthesia Chart Review:  Case:  [id number] Date/Time:  01/06/18 0715   Procedure:  Right Lumbar 2-3 Anterolateral lumbar interbody fusion with lateral plate (Right ) - Right Lumbar 2-3 Anterolateral lumbar interbody fusion with lateral plate   Anesthesia type:  General   Pre-op diagnosis:  Spinal stenosis, Lumbar region with neurogenic claudication   Location:  [facility] OR ROOM 21 / [place] OR   Surgeon:  Erline Levine, MD      DISCUSSION: 82 yo female never smoker for above procedure. Pertinent hx includes Anxiety, Anemia, HLD, Colon CA s/p right hemicolectomy, legally blind. She had multilevel lumbar fusion 0/1027 without complication, notes in Epic.  Pt was seen January 2018 by Dr. Irish Lack for clearance prior to epidural steroid injections. Per his note:  "Kathryn Romero is a 82 y.o. female  Who has no documented CAD.  She had a negative stress test and a normal EF by echo in 2016.  She has a h/o back problems.  She went to Dr. Melven Sartorius office and had an appointment.  She told him about some brief chest pains she was having occasionally.  No relation to exertion.  She was sent to the ER.  She had a negative workup at that time.  She is here for clearance.    Preoperative evaluation: Negative stress test and normal EF by echo in 2016. Several atypical features to her chest pain. Negative workup in the emergency room. No further cardiac workup needed prior to epidural injection."  Given negative CAD history, absence of current symptoms, and previous clearance by Dr. Irish Lack, I anticipate she can proceed with surgery as planned barring acute status change.   VS: BP 134/66   Pulse 62   Temp 36.8 C   Resp 20   Ht 5\' 1"  (1.549 m)   Wt 79.8 kg   SpO2 96%   BMI 33.25 kg/m   PROVIDERS: Prince Solian, MD is PCP  Larae Grooms, MD is Cardiologist last seen 06/03/2016  LABS: Labs reviewed: Acceptable for surgery. (all labs ordered are listed, but only abnormal results are  displayed)  Labs Reviewed  BASIC METABOLIC PANEL - Abnormal; Notable for the following components:      Result Value   Glucose, Bld 105 (*)    BUN 24 (*)    All other components within normal limits  SURGICAL PCR SCREEN  CBC  TYPE AND SCREEN     IMAGES: CHEST  2 VIEW 06/02/2016  COMPARISON:  None.  FINDINGS: The heart size and mediastinal contours are within normal limits. Both lungs are clear. The visualized skeletal structures are unremarkable.  IMPRESSION: No active cardiopulmonary disease.  EKG: 06/02/2016: Normal sinus rhythm.  Left axis deviation.  Cannot rule out anterior infarct, age undetermined.  CV: Lexiscan 11/05/2014:  Nuclear stress EF: 78%.   The ejection fraction is 70%. There is normal wall motion. This is a normal study. There is no scar or ischemia. This is a low risk scan  Echo 11/05/14: Study Conclusions  - Left ventricle: The cavity size was normal. Wall thickness was   increased in a pattern of mild LVH. Systolic function was normal.   The estimated ejection fraction was in the range of 60% to 65%.   Wall motion was normal; there were no regional wall motion   abnormalities. Doppler parameters are consistent with abnormal   left ventricular relaxation (grade 1 diastolic dysfunction). The   E/e&' ratio is between 8-15, suggesting indeterminate LV filling   pressure. - Mitral  valve: Mildly thickened leaflets . There was trivial   regurgitation. - Left atrium: The atrium was normal in size. - Right atrium: The atrium was mildly dilated. - Inferior vena cava: The vessel was normal in size. The   respirophasic diameter changes were in the normal range (>= 50%),   consistent with normal central venous pressure.  Impressions:  - LVEF 60-65%, mild LVH, normal wall motion, diastolic dysfunction,   indeterminate LV filling pressure, mild RAE, normal IVC size.   Past Medical History:  Diagnosis Date  . ANEMIA-NOS   . ANXIETY   .  Arthritis   . Cancer [facility]) 2005   colon cancer  . Chronic back pain    spondylolisthesis  . History of blood transfusion    no abnormal reaction noted  . History of shingles   . HYPERLIPIDEMIA    takes Pravastatin daily  . Legally blind   . Nocturia   . RETINITIS PIGMENTOSA   . SYNDROME, CARPAL TUNNEL   . Weakness    numbness and tingling in legs and feet    Past Surgical History:  Procedure Laterality Date  . ABDOMINAL HYSTERECTOMY  1989  . APPENDECTOMY  1946  . carapl tunnel release Left   . cataract surgery Bilateral   . COLONOSCOPY    . MAXIMUM ACCESS (MAS)POSTERIOR LUMBAR INTERBODY FUSION (PLIF) 2 LEVEL N/A 11/14/2015   Procedure: Lumbar Four-Five Maximum access posterior lumbar interbody fusion, Lumbar Three-Four Lumbar Four-Five Posterolateral Fusion and Pedicle Screws;  Surgeon: Erline Levine, MD;  Location: [place] NEURO ORS;  Service: Neurosurgery;  Laterality: N/A;  L3-4 L4-5 Maximum access posterior lumbar interbody fusion  . OOPHORECTOMY  1989  . s/p ganglion cyst  1973  . s/p right hemicolectomy  12 yrs ago    MEDICATIONS: . Alpha-Lipoic Acid 100 MG CAPS  . B Complex-Biotin-FA (HM VITAMIN B100 COMPLEX) TABS  . Cholecalciferol (VITAMIN D3) 3000 units TABS  . ciprofloxacin (CIPRO) 500 MG tablet  . Collagen Hydrolysate POWD  . magnesium 30 MG tablet  . meloxicam (MOBIC) 15 MG tablet  . OVER THE COUNTER MEDICATION   No current facility-administered medications for this encounter.     Wynonia Musty [facility] Short Stay Center/Anesthesiology Phone [phone] 12/30/2017 3:52 PM

## 2018-01-06 ENCOUNTER — Inpatient Hospital Stay (HOSPITAL_COMMUNITY): Payer: PPO | Admitting: Physician Assistant

## 2018-01-06 ENCOUNTER — Encounter (HOSPITAL_COMMUNITY)
Admission: RE | Disposition: A | Discharge status: Skilled Nursing Facility | Payer: Self-pay | Source: Home / Self Care | Attending: Neurosurgery

## 2018-01-06 ENCOUNTER — Inpatient Hospital Stay (HOSPITAL_COMMUNITY): Payer: PPO

## 2018-01-06 ENCOUNTER — Encounter (HOSPITAL_COMMUNITY): Payer: Self-pay | Admitting: *Deleted

## 2018-01-06 ENCOUNTER — Inpatient Hospital Stay (HOSPITAL_COMMUNITY): Payer: PPO | Admitting: Certified Registered"

## 2018-01-06 ENCOUNTER — Inpatient Hospital Stay (HOSPITAL_COMMUNITY)
Admission: RE | Admit: 2018-01-06 | Discharge: 2018-01-11 | DRG: 460 | Disposition: A | Discharge status: Skilled Nursing Facility | Payer: PPO | Attending: Neurosurgery | Admitting: Neurosurgery

## 2018-01-06 DIAGNOSIS — Z419 Encounter for procedure for purposes other than remedying health state, unspecified: Secondary | ICD-10-CM

## 2018-01-06 DIAGNOSIS — R279 Unspecified lack of coordination: Secondary | ICD-10-CM | POA: Diagnosis not present

## 2018-01-06 DIAGNOSIS — K5900 Constipation, unspecified: Secondary | ICD-10-CM | POA: Diagnosis not present

## 2018-01-06 DIAGNOSIS — Z4889 Encounter for other specified surgical aftercare: Secondary | ICD-10-CM | POA: Diagnosis not present

## 2018-01-06 DIAGNOSIS — Z885 Allergy status to narcotic agent status: Secondary | ICD-10-CM | POA: Diagnosis not present

## 2018-01-06 DIAGNOSIS — M5416 Radiculopathy, lumbar region: Secondary | ICD-10-CM | POA: Diagnosis present

## 2018-01-06 DIAGNOSIS — M6281 Muscle weakness (generalized): Secondary | ICD-10-CM | POA: Diagnosis not present

## 2018-01-06 DIAGNOSIS — Z743 Need for continuous supervision: Secondary | ICD-10-CM | POA: Diagnosis not present

## 2018-01-06 DIAGNOSIS — M48062 Spinal stenosis, lumbar region with neurogenic claudication: Principal | ICD-10-CM | POA: Diagnosis present

## 2018-01-06 DIAGNOSIS — M4326 Fusion of spine, lumbar region: Secondary | ICD-10-CM | POA: Diagnosis not present

## 2018-01-06 DIAGNOSIS — Z981 Arthrodesis status: Secondary | ICD-10-CM | POA: Diagnosis not present

## 2018-01-06 DIAGNOSIS — R110 Nausea: Secondary | ICD-10-CM | POA: Diagnosis not present

## 2018-01-06 DIAGNOSIS — R2689 Other abnormalities of gait and mobility: Secondary | ICD-10-CM | POA: Diagnosis not present

## 2018-01-06 DIAGNOSIS — M4316 Spondylolisthesis, lumbar region: Secondary | ICD-10-CM | POA: Diagnosis present

## 2018-01-06 DIAGNOSIS — I10 Essential (primary) hypertension: Secondary | ICD-10-CM | POA: Diagnosis not present

## 2018-01-06 DIAGNOSIS — E785 Hyperlipidemia, unspecified: Secondary | ICD-10-CM | POA: Diagnosis not present

## 2018-01-06 DIAGNOSIS — R41841 Cognitive communication deficit: Secondary | ICD-10-CM | POA: Diagnosis not present

## 2018-01-06 DIAGNOSIS — Z882 Allergy status to sulfonamides status: Secondary | ICD-10-CM | POA: Diagnosis not present

## 2018-01-06 DIAGNOSIS — M4726 Other spondylosis with radiculopathy, lumbar region: Secondary | ICD-10-CM | POA: Diagnosis not present

## 2018-01-06 DIAGNOSIS — Z91013 Allergy to seafood: Secondary | ICD-10-CM

## 2018-01-06 DIAGNOSIS — K5903 Drug induced constipation: Secondary | ICD-10-CM | POA: Diagnosis not present

## 2018-01-06 DIAGNOSIS — M25551 Pain in right hip: Secondary | ICD-10-CM | POA: Diagnosis not present

## 2018-01-06 DIAGNOSIS — M48061 Spinal stenosis, lumbar region without neurogenic claudication: Secondary | ICD-10-CM | POA: Diagnosis present

## 2018-01-06 DIAGNOSIS — M549 Dorsalgia, unspecified: Secondary | ICD-10-CM | POA: Diagnosis not present

## 2018-01-06 DIAGNOSIS — H3552 Pigmentary retinal dystrophy: Secondary | ICD-10-CM | POA: Diagnosis not present

## 2018-01-06 HISTORY — PX: ANTERIOR LAT LUMBAR FUSION: SHX1168

## 2018-01-06 SURGERY — ANTERIOR LATERAL LUMBAR FUSION 1 LEVEL
Anesthesia: General | Laterality: Right

## 2018-01-06 MED ORDER — MAGNESIUM 30 MG PO TABS: 30.0000 mg | ORAL_TABLET | Freq: Every day | ORAL | Status: DC

## 2018-01-06 MED ORDER — HYDROMORPHONE HCL 1 MG/ML IJ SOLN
INTRAMUSCULAR | Status: AC
  Filled 2018-01-06: qty 1

## 2018-01-06 MED ORDER — 0.9 % SODIUM CHLORIDE (POUR BTL) OPTIME
TOPICAL | Status: DC | PRN
  Administered 2018-01-06: 1000 mL

## 2018-01-06 MED ORDER — CEFAZOLIN SODIUM-DEXTROSE 2-4 GM/100ML-% IV SOLN
2.0000 g | Freq: Three times a day (TID) | INTRAVENOUS | Status: AC
  Administered 2018-01-06 (×2): 2 g via INTRAVENOUS
  Filled 2018-01-06 (×2): qty 100

## 2018-01-06 MED ORDER — ONDANSETRON HCL 4 MG/2ML IJ SOLN
INTRAMUSCULAR | Status: AC
  Filled 2018-01-06: qty 2

## 2018-01-06 MED ORDER — ACETAMINOPHEN 10 MG/ML IV SOLN: 1000.0000 mg | Freq: Once | INTRAVENOUS | Status: DC | PRN

## 2018-01-06 MED ORDER — DEXAMETHASONE SODIUM PHOSPHATE 10 MG/ML IJ SOLN
INTRAMUSCULAR | Status: AC
  Filled 2018-01-06: qty 1

## 2018-01-06 MED ORDER — SUCCINYLCHOLINE CHLORIDE 200 MG/10ML IV SOSY
PREFILLED_SYRINGE | INTRAVENOUS | Status: DC | PRN
  Administered 2018-01-06: 100 mg via INTRAVENOUS

## 2018-01-06 MED ORDER — B COMPLEX-C PO TABS
1.0000 | ORAL_TABLET | Freq: Every day | ORAL | Status: DC
  Administered 2018-01-08 – 2018-01-09 (×2): 1 via ORAL
  Filled 2018-01-06 (×8): qty 1

## 2018-01-06 MED ORDER — METHOCARBAMOL 1000 MG/10ML IJ SOLN
500.0000 mg | Freq: Four times a day (QID) | INTRAVENOUS | Status: DC | PRN
  Administered 2018-01-07: 500 mg via INTRAVENOUS
  Filled 2018-01-06 (×4): qty 5

## 2018-01-06 MED ORDER — HEMOSTATIC AGENTS (NO CHARGE) OPTIME
TOPICAL | Status: DC | PRN
  Administered 2018-01-06: 1

## 2018-01-06 MED ORDER — ONDANSETRON HCL 4 MG PO TABS
4.0000 mg | ORAL_TABLET | Freq: Four times a day (QID) | ORAL | Status: DC | PRN
  Administered 2018-01-10: 4 mg via ORAL
  Filled 2018-01-06: qty 1

## 2018-01-06 MED ORDER — SUCCINYLCHOLINE CHLORIDE 200 MG/10ML IV SOSY
PREFILLED_SYRINGE | INTRAVENOUS | Status: AC
  Filled 2018-01-06: qty 10

## 2018-01-06 MED ORDER — COLLAGEN HYDROLYSATE POWD: 1.0000 | Freq: Every day | Status: DC

## 2018-01-06 MED ORDER — FLEET ENEMA 7-19 GM/118ML RE ENEM: 1.0000 | ENEMA | Freq: Once | RECTAL | Status: DC | PRN

## 2018-01-06 MED ORDER — METHOCARBAMOL 500 MG PO TABS
500.0000 mg | ORAL_TABLET | Freq: Four times a day (QID) | ORAL | Status: DC | PRN
  Administered 2018-01-06 – 2018-01-09 (×3): 500 mg via ORAL
  Filled 2018-01-06 (×3): qty 1

## 2018-01-06 MED ORDER — EPHEDRINE 5 MG/ML INJ
INTRAVENOUS | Status: AC
  Filled 2018-01-06: qty 10

## 2018-01-06 MED ORDER — CEFAZOLIN SODIUM-DEXTROSE 2-4 GM/100ML-% IV SOLN
2.0000 g | INTRAVENOUS | Status: AC
  Administered 2018-01-06: 2 g via INTRAVENOUS
  Filled 2018-01-06: qty 100

## 2018-01-06 MED ORDER — TRAMADOL HCL 50 MG PO TABS
50.0000 mg | ORAL_TABLET | Freq: Four times a day (QID) | ORAL | Status: DC | PRN
  Administered 2018-01-06 – 2018-01-10 (×9): 50 mg via ORAL
  Filled 2018-01-06 (×9): qty 1

## 2018-01-06 MED ORDER — DOCUSATE SODIUM 100 MG PO CAPS
100.0000 mg | ORAL_CAPSULE | Freq: Two times a day (BID) | ORAL | Status: DC
  Administered 2018-01-06 – 2018-01-10 (×9): 100 mg via ORAL
  Filled 2018-01-06 (×9): qty 1

## 2018-01-06 MED ORDER — PROMETHAZINE HCL 25 MG/ML IJ SOLN: 6.2500 mg | INTRAMUSCULAR | Status: DC | PRN

## 2018-01-06 MED ORDER — KETOROLAC TROMETHAMINE 15 MG/ML IJ SOLN
15.0000 mg | Freq: Four times a day (QID) | INTRAMUSCULAR | Status: DC | PRN
  Administered 2018-01-06 – 2018-01-10 (×6): 15 mg via INTRAVENOUS
  Filled 2018-01-06 (×6): qty 1

## 2018-01-06 MED ORDER — ONDANSETRON HCL 4 MG/2ML IJ SOLN
4.0000 mg | Freq: Four times a day (QID) | INTRAMUSCULAR | Status: DC | PRN
  Administered 2018-01-06 – 2018-01-10 (×2): 4 mg via INTRAVENOUS
  Filled 2018-01-06 (×2): qty 2

## 2018-01-06 MED ORDER — MENTHOL 3 MG MT LOZG: 1.0000 | LOZENGE | OROMUCOSAL | Status: DC | PRN

## 2018-01-06 MED ORDER — SODIUM CHLORIDE 0.9 % IV SOLN
250.0000 mL | INTRAVENOUS | Status: DC
  Administered 2018-01-06: 250 mL via INTRAVENOUS

## 2018-01-06 MED ORDER — FENTANYL CITRATE (PF) 250 MCG/5ML IJ SOLN
INTRAMUSCULAR | Status: AC
  Filled 2018-01-06: qty 5

## 2018-01-06 MED ORDER — EPHEDRINE SULFATE-NACL 50-0.9 MG/10ML-% IV SOSY
PREFILLED_SYRINGE | INTRAVENOUS | Status: DC | PRN
  Administered 2018-01-06 (×2): 10 mg via INTRAVENOUS

## 2018-01-06 MED ORDER — VITAMIN D 1000 UNITS PO TABS
3000.0000 [IU] | ORAL_TABLET | Freq: Every day | ORAL | Status: DC
  Administered 2018-01-06 – 2018-01-10 (×5): 3000 [IU] via ORAL
  Filled 2018-01-06 (×7): qty 3

## 2018-01-06 MED ORDER — BUPIVACAINE HCL (PF) 0.5 % IJ SOLN
INTRAMUSCULAR | Status: DC | PRN
  Administered 2018-01-06: 5 mL

## 2018-01-06 MED ORDER — LIDOCAINE-EPINEPHRINE 1 %-1:100000 IJ SOLN
INTRAMUSCULAR | Status: DC | PRN
  Administered 2018-01-06: 5 mL

## 2018-01-06 MED ORDER — BISACODYL 10 MG RE SUPP: 10.0000 mg | Freq: Every day | RECTAL | Status: DC | PRN

## 2018-01-06 MED ORDER — DEXAMETHASONE SODIUM PHOSPHATE 10 MG/ML IJ SOLN
INTRAMUSCULAR | Status: DC | PRN
  Administered 2018-01-06: 10 mg via INTRAVENOUS

## 2018-01-06 MED ORDER — FENTANYL CITRATE (PF) 100 MCG/2ML IJ SOLN
INTRAMUSCULAR | Status: DC | PRN
  Administered 2018-01-06 (×5): 50 ug via INTRAVENOUS

## 2018-01-06 MED ORDER — ACETAMINOPHEN 325 MG PO TABS
650.0000 mg | ORAL_TABLET | ORAL | Status: DC | PRN
  Filled 2018-01-06: qty 2

## 2018-01-06 MED ORDER — SODIUM CHLORIDE 0.9% FLUSH: 3.0000 mL | INTRAVENOUS | Status: DC | PRN

## 2018-01-06 MED ORDER — CHLORHEXIDINE GLUCONATE CLOTH 2 % EX PADS: 6.0000 | MEDICATED_PAD | Freq: Once | CUTANEOUS | Status: DC

## 2018-01-06 MED ORDER — LIDOCAINE-EPINEPHRINE 1 %-1:100000 IJ SOLN
INTRAMUSCULAR | Status: AC
  Filled 2018-01-06: qty 1

## 2018-01-06 MED ORDER — BUPIVACAINE HCL (PF) 0.5 % IJ SOLN
INTRAMUSCULAR | Status: AC
  Filled 2018-01-06: qty 30

## 2018-01-06 MED ORDER — LIDOCAINE 2% (20 MG/ML) 5 ML SYRINGE
INTRAMUSCULAR | Status: DC | PRN
  Administered 2018-01-06: 60 mg via INTRAVENOUS

## 2018-01-06 MED ORDER — POLYETHYLENE GLYCOL 3350 17 G PO PACK
17.0000 g | PACK | Freq: Every day | ORAL | Status: DC | PRN
  Administered 2018-01-10: 17 g via ORAL
  Filled 2018-01-06: qty 1

## 2018-01-06 MED ORDER — SODIUM CHLORIDE 0.9 % IV SOLN
INTRAVENOUS | Status: DC | PRN
  Administered 2018-01-06: 40 ug/min via INTRAVENOUS

## 2018-01-06 MED ORDER — THROMBIN 5000 UNITS EX SOLR
CUTANEOUS | Status: AC
  Filled 2018-01-06: qty 10000

## 2018-01-06 MED ORDER — PHENOL 1.4 % MT LIQD: 1.0000 | OROMUCOSAL | Status: DC | PRN

## 2018-01-06 MED ORDER — ROCURONIUM BROMIDE 50 MG/5ML IV SOSY
PREFILLED_SYRINGE | INTRAVENOUS | Status: AC
  Filled 2018-01-06: qty 5

## 2018-01-06 MED ORDER — ZOLPIDEM TARTRATE 5 MG PO TABS: 5.0000 mg | ORAL_TABLET | Freq: Every evening | ORAL | Status: DC | PRN

## 2018-01-06 MED ORDER — METHOCARBAMOL 500 MG PO TABS: 500.0000 mg | ORAL_TABLET | Freq: Four times a day (QID) | ORAL | Status: DC | PRN

## 2018-01-06 MED ORDER — FAMOTIDINE IN NACL 20-0.9 MG/50ML-% IV SOLN
20.0000 mg | Freq: Two times a day (BID) | INTRAVENOUS | Status: DC
  Administered 2018-01-06 (×2): 20 mg via INTRAVENOUS
  Filled 2018-01-06 (×3): qty 50

## 2018-01-06 MED ORDER — ALUM & MAG HYDROXIDE-SIMETH 200-200-20 MG/5ML PO SUSP: 30.0000 mL | Freq: Four times a day (QID) | ORAL | Status: DC | PRN

## 2018-01-06 MED ORDER — LACTATED RINGERS IV SOLN
INTRAVENOUS | Status: DC | PRN
  Administered 2018-01-06 (×2): via INTRAVENOUS

## 2018-01-06 MED ORDER — ACETAMINOPHEN 650 MG RE SUPP: 650.0000 mg | RECTAL | Status: DC | PRN

## 2018-01-06 MED ORDER — LABETALOL HCL 5 MG/ML IV SOLN
INTRAVENOUS | Status: DC | PRN
  Administered 2018-01-06 (×2): 5 mg via INTRAVENOUS

## 2018-01-06 MED ORDER — PROPOFOL 10 MG/ML IV BOLUS
INTRAVENOUS | Status: AC
  Filled 2018-01-06: qty 20

## 2018-01-06 MED ORDER — HYDROMORPHONE HCL 1 MG/ML IJ SOLN
0.2500 mg | INTRAMUSCULAR | Status: DC | PRN
  Administered 2018-01-06 (×2): 0.5 mg via INTRAVENOUS

## 2018-01-06 MED ORDER — PROPOFOL 10 MG/ML IV BOLUS
INTRAVENOUS | Status: DC | PRN
  Administered 2018-01-06: 30 mg via INTRAVENOUS
  Administered 2018-01-06: 150 mg via INTRAVENOUS

## 2018-01-06 MED ORDER — SODIUM CHLORIDE 0.9% FLUSH
3.0000 mL | Freq: Two times a day (BID) | INTRAVENOUS | Status: DC
  Administered 2018-01-06 – 2018-01-09 (×7): 3 mL via INTRAVENOUS

## 2018-01-06 MED ORDER — KCL IN DEXTROSE-NACL 20-5-0.45 MEQ/L-%-% IV SOLN
INTRAVENOUS | Status: DC
  Administered 2018-01-06 – 2018-01-07 (×3): via INTRAVENOUS
  Filled 2018-01-06 (×2): qty 1000

## 2018-01-06 MED ORDER — ALPHA-LIPOIC ACID 100 MG PO CAPS: 100.0000 mg | ORAL_CAPSULE | Freq: Every day | ORAL | Status: DC

## 2018-01-06 MED ORDER — LABETALOL HCL 5 MG/ML IV SOLN
INTRAVENOUS | Status: AC
  Filled 2018-01-06: qty 4

## 2018-01-06 MED ORDER — ONDANSETRON HCL 4 MG/2ML IJ SOLN
INTRAMUSCULAR | Status: DC | PRN
  Administered 2018-01-06 (×2): 4 mg via INTRAVENOUS

## 2018-01-06 MED ORDER — LIDOCAINE 2% (20 MG/ML) 5 ML SYRINGE
INTRAMUSCULAR | Status: AC
  Filled 2018-01-06: qty 5

## 2018-01-06 SURGICAL SUPPLY — 65 items
ADH SKN CLS APL DERMABOND .7 (GAUZE/BANDAGES/DRESSINGS) ×2
AGENT HMST KT MTR STRL THRMB (HEMOSTASIS) ×1
BLADE CLIPPER SURG (BLADE) IMPLANT
BOLT SPNL LRG 45X5.5XPLAT NS (Screw) IMPLANT
CARTRIDGE OIL MAESTRO DRILL (MISCELLANEOUS) ×1 IMPLANT
DECANTER SPIKE VIAL GLASS SM (MISCELLANEOUS) ×3 IMPLANT
DERMABOND ADVANCED (GAUZE/BANDAGES/DRESSINGS) ×4
DERMABOND ADVANCED .7 DNX12 (GAUZE/BANDAGES/DRESSINGS) ×2 IMPLANT
DIFFUSER DRILL AIR PNEUMATIC (MISCELLANEOUS) ×3 IMPLANT
DRAPE C-ARM 42X72 X-RAY (DRAPES) ×3 IMPLANT
DRAPE C-ARMOR (DRAPES) ×3 IMPLANT
DRAPE LAPAROTOMY 100X72X124 (DRAPES) ×3 IMPLANT
DRSG OPSITE POSTOP 3X4 (GAUZE/BANDAGES/DRESSINGS) ×4 IMPLANT
DURAPREP 26ML APPLICATOR (WOUND CARE) ×3 IMPLANT
ELECT REM PT RETURN 9FT ADLT (ELECTROSURGICAL) ×3
ELECTRODE REM PT RTRN 9FT ADLT (ELECTROSURGICAL) ×1 IMPLANT
GAUZE 4X4 16PLY RFD (DISPOSABLE) IMPLANT
GLOVE BIO SURGEON STRL SZ8 (GLOVE) ×3 IMPLANT
GLOVE BIOGEL M 8.0 STRL (GLOVE) ×2 IMPLANT
GLOVE BIOGEL PI IND STRL 7.0 (GLOVE) IMPLANT
GLOVE BIOGEL PI IND STRL 8 (GLOVE) ×1 IMPLANT
GLOVE BIOGEL PI IND STRL 8.5 (GLOVE) ×1 IMPLANT
GLOVE BIOGEL PI INDICATOR 7.0 (GLOVE) ×2
GLOVE BIOGEL PI INDICATOR 8 (GLOVE) ×6
GLOVE BIOGEL PI INDICATOR 8.5 (GLOVE) ×2
GLOVE ECLIPSE 7.0 STRL STRAW (GLOVE) ×2 IMPLANT
GLOVE ECLIPSE 7.5 STRL STRAW (GLOVE) ×6 IMPLANT
GLOVE ECLIPSE 8.0 STRL XLNG CF (GLOVE) ×3 IMPLANT
GLOVE EXAM NITRILE LRG STRL (GLOVE) IMPLANT
GLOVE EXAM NITRILE XL STR (GLOVE) IMPLANT
GLOVE EXAM NITRILE XS STR PU (GLOVE) IMPLANT
GOWN STRL REUS W/ TWL LRG LVL3 (GOWN DISPOSABLE) IMPLANT
GOWN STRL REUS W/ TWL XL LVL3 (GOWN DISPOSABLE) ×2 IMPLANT
GOWN STRL REUS W/TWL 2XL LVL3 (GOWN DISPOSABLE) ×4 IMPLANT
GOWN STRL REUS W/TWL LRG LVL3 (GOWN DISPOSABLE)
GOWN STRL REUS W/TWL XL LVL3 (GOWN DISPOSABLE) ×3
KIT BASIN OR (CUSTOM PROCEDURE TRAY) ×3 IMPLANT
KIT DILATOR XLIF 5 (KITS) ×1 IMPLANT
KIT INFUSE XX SMALL 0.7CC (Orthopedic Implant) ×2 IMPLANT
KIT SURGICAL ACCESS MAXCESS 4 (KITS) ×2 IMPLANT
KIT TURNOVER KIT B (KITS) ×3 IMPLANT
KIT XLIF (KITS) ×1
MARKER SKIN DUAL TIP RULER LAB (MISCELLANEOUS) ×4 IMPLANT
MODULE NVM5 NEXT GEN EMG (NEEDLE) ×2 IMPLANT
MODULUS XLW 10X22X45MM 10 DEG (Cage) ×2 IMPLANT
NDL HYPO 25X1 1.5 SAFETY (NEEDLE) ×1 IMPLANT
NEEDLE HYPO 25X1 1.5 SAFETY (NEEDLE) ×3 IMPLANT
NS IRRIG 1000ML POUR BTL (IV SOLUTION) ×3 IMPLANT
OIL CARTRIDGE MAESTRO DRILL (MISCELLANEOUS) ×3
PACK LAMINECTOMY NEURO (CUSTOM PROCEDURE TRAY) ×3 IMPLANT
PLATE 2H 10MM (Plate) ×2 IMPLANT
PUTTY BONE ATTRAX 5CC STRIP (Putty) ×2 IMPLANT
SCREW 45MM (Screw) ×6 IMPLANT
SPONGE LAP 4X18 RFD (DISPOSABLE) IMPLANT
SPONGE SURGIFOAM ABS GEL SZ50 (HEMOSTASIS) IMPLANT
STAPLER SKIN PROX WIDE 3.9 (STAPLE) ×3 IMPLANT
SURGIFLO W/THROMBIN 8M KIT (HEMOSTASIS) ×2 IMPLANT
SUT VIC AB 1 CT1 18XBRD ANBCTR (SUTURE) ×1 IMPLANT
SUT VIC AB 1 CT1 8-18 (SUTURE) ×3
SUT VIC AB 2-0 CT1 18 (SUTURE) ×1 IMPLANT
SUT VIC AB 3-0 SH 8-18 (SUTURE) ×3 IMPLANT
TOWEL GREEN STERILE (TOWEL DISPOSABLE) ×2 IMPLANT
TOWEL GREEN STERILE FF (TOWEL DISPOSABLE) ×2 IMPLANT
TRAY FOLEY MTR SLVR 16FR STAT (SET/KITS/TRAYS/PACK) ×3 IMPLANT
WATER STERILE IRR 1000ML POUR (IV SOLUTION) ×3 IMPLANT

## 2018-01-06 NOTE — Anesthesia Preprocedure Evaluation (Signed)
Anesthesia Evaluation  Patient identified by MRN, date of birth, ID band Patient awake    Reviewed: Allergy & Precautions, NPO status , Patient's Chart, lab work & pertinent test results  Airway Mallampati: II  TM Distance: >3 FB Neck ROM: Full    Dental no notable dental hx.    Pulmonary neg pulmonary ROS,    Pulmonary exam normal breath sounds clear to auscultation       Cardiovascular hypertension, Normal cardiovascular exam Rhythm:Regular Rate:Normal     Neuro/Psych negative neurological ROS  negative psych ROS   GI/Hepatic negative GI ROS, Neg liver ROS,   Endo/Other  negative endocrine ROS  Renal/GU negative Renal ROS  negative genitourinary   Musculoskeletal negative musculoskeletal ROS (+)   Abdominal   Peds negative pediatric ROS (+)  Hematology negative hematology ROS (+)   Anesthesia Other Findings   Reproductive/Obstetrics negative OB ROS                             Anesthesia Physical Anesthesia Plan  ASA: II  Anesthesia Plan: General   Post-op Pain Management:    Induction: Intravenous  PONV Risk Score and Plan: 3 and Ondansetron, Dexamethasone and Treatment may vary due to age or medical condition  Airway Management Planned: Oral ETT  Additional Equipment:   Intra-op Plan:   Post-operative Plan: Extubation in OR  Informed Consent: I have reviewed the patients History and Physical, chart, labs and discussed the procedure including the risks, benefits and alternatives for the proposed anesthesia with the patient or authorized representative who has indicated his/her understanding and acceptance.     Dental advisory given  Plan Discussed with: CRNA and Surgeon  Anesthesia Plan Comments:         Anesthesia Quick Evaluation  

## 2018-01-06 NOTE — Progress Notes (Signed)
Orthopedic Tech Progress Note Patient Details:  Kathryn Romero March 13, 1936 [id number]          Kathryn Romero 01/06/2018, 2:52 PMCalled [employer] for Lumbar sacral and LSO support.

## 2018-01-06 NOTE — Transfer of Care (Signed)
Immediate Anesthesia Transfer of Care Note  Patient: Kathryn Romero  Procedure(s) Performed: Right Lumbar Two-Three Anterolateral lumbar interbody fusion with lateral plate (Right )  Patient Location: PACU  Anesthesia Type:General  Level of Consciousness: drowsy and patient cooperative  Airway & Oxygen Therapy: Patient Spontanous Breathing and Patient connected to face mask oxygen  Post-op Assessment: Report given to RN and Post -op Vital signs reviewed and stable  Post vital signs: Reviewed and stable  Last Vitals:  Vitals Value Taken Time  BP 164/77 01/06/2018  9:39 AM  Temp    Pulse 76 01/06/2018  9:40 AM  Resp 12 01/06/2018  9:40 AM  SpO2 95 % 01/06/2018  9:40 AM  Vitals shown include unvalidated device data.  Last Pain:  Vitals:   01/06/18 0936  TempSrc:   PainSc: (P) Asleep      Patients Stated Pain Goal: 3 (83/29/19 1660)  Complications: No apparent anesthesia complications

## 2018-01-06 NOTE — Interval H&P Note (Signed)
History and Physical Interval Note:  01/06/2018 7:31 AM  Kathryn Romero  has presented today for surgery, with the diagnosis of Spinal stenosis, Lumbar region with neurogenic claudication  The various methods of treatment have been discussed with the patient and family. After consideration of risks, benefits and other options for treatment, the patient has consented to  Procedure(s) with comments: Right Lumbar 2-3 Anterolateral lumbar interbody fusion with lateral plate (Right) - Right Lumbar 2-3 Anterolateral lumbar interbody fusion with lateral plate as a surgical intervention .  The patient's history has been reviewed, patient examined, no change in status, stable for surgery.  I have reviewed the patient's chart and labs.  Questions were answered to the patient's satisfaction.     Caston Coopersmith D

## 2018-01-06 NOTE — H&P (Signed)
  H & P imported from office EMR

## 2018-01-06 NOTE — Op Note (Signed)
01/06/2018  9:24 AM  PATIENT:  Kathryn Romero  82 y.o. female  PRE-OPERATIVE DIAGNOSIS:  Spinal stenosis, Lumbar region with neurogenic claudication, lumbago, radiculopathy L 23 level  POST-OPERATIVE DIAGNOSIS:  Spinal stenosis, Lumbar region with neurogenic claudication, lumbago, radiculopathy L 23 level  PROCEDURE:  Procedure(s) with comments: Right Lumbar Two-Three Anterolateral lumbar interbody fusion with lateral plate (Right) - Right Lumbar Two-Three Anterolateral lumbar interbody fusion with lateral plate  SURGEON:  Surgeon(s) and Role:    Erline Levine, MD - Primary  PHYSICIAN ASSISTANT:   ASSISTANTS: Poteat, RN   ANESTHESIA:   General  EBL:  25 mL   BLOOD ADMINISTERED:none  DRAINS: none   LOCAL MEDICATIONS USED:  MARCAINE    and LIDOCAINE   SPECIMEN:  No Specimen  DISPOSITION OF SPECIMEN:  N/A  COUNTS:  YES  TOURNIQUET:  * No tourniquets in log *  DICTATION: Patient is a 82 year old with severe spondylosis stenosis of the lumbar spine. It was elected to take her to surgery for anterolateral decompression and lateral plate fixation at the L 23 level.  She had previously undergone fusion L 3 - L 5 levels.  Procedure: Patient was brought to the operating room and placed in a left lateral decubitus position on the operative table and using orthogonally projected C-arm fluoroscopy the patient was placed so that the L 23  level was visualized in AP and lateral plane. The patient was then taped into position. The table was flexed so as to expose the L 23 level. Skin was marked along with a posterior finger dissection incision. Her flank was then prepped and draped in usual sterile fashion and incisions were made overlying the 12 th rib. Posterior finger dissection was made to enter the retroperitoneal space and then subsequently the probe was inserted into the psoas muscle from the right side initially at the L23 level. After mapping the neural elements were able to  dock the probe per the posterior aspect of this vertebral level and without indications electrically of too close proximity to the neural tissues. Subsequently the self-retaining tractor was.after sequential dilators were utilized the shim was employed and the interspace was cleared of psoas muscle and then incised. A thorough discectomy was performed. Instruments were used to clear the interspace of disc material. After thorough discectomy was performed and this was performed using AP and lateral fluoroscopy a 10 lordotic by 45 x 22 mm implant was packed with extra extra small BMP and Attrax. This was tamped into position using the slides and its position was confirmed on AP and lateral fluoroscopy. A Decade lateral plate (size 10) was affixed to the lateral spine with  5.5 x 45 mm bolts x 2. Both screws were torque locked and positioning was confirmed with AP and lateral fluoroscopy. . Hemostasis was assured the wounds were irrigated interrupted Vicryl sutures.Sterile occlusive dressing was placed with Dermabond. The patient was then extubated in the operating room and taken to recovery in stable and satisfactory condition having tolerated his operation well. Counts were correct at the end of the case.  PLAN OF CARE: Admit to inpatient   PATIENT DISPOSITION:  PACU - hemodynamically stable.   Delay start of Pharmacological VTE agent (>24hrs) due to surgical blood loss or risk of bleeding: yes

## 2018-01-06 NOTE — Social Work (Signed)
CSW acknowledging consult for SNF placement, pt will need therapy recommendations and insurance auth prior to discharge.   [place], [place] [facility] [phone]

## 2018-01-06 NOTE — H&P (Signed)
Patient ID:   [id number] Patient: Kathryn Romero  Date of Birth: 1935-11-10 Visit Type: Office Visit   Date: 12/14/2017 02:00 PM Provider: Marchia Meiers. Vertell Limber MD   This 82 year old female presents for back pain.  HISTORY OF PRESENT ILLNESS:  1.  back pain  Patient returns as scheduled for nurse visit to discuss surgery plan and perioperative expectations.         Medical/Surgical/Interim History Reviewed, no change.  Last detailed document date:06/20/2017.     Family History:  Reviewed, no changes.  Last detailed document date:10/07/2015.   Social History: Reviewed, no changes. Last detailed document date: 10/07/2015.    MEDICATIONS: (added, continued or stopped this visit) Started Medication Directions Instruction Stopped  07/26/2017 Mobic 7.5 mg tablet take 1 -2 tabs daily as needed for pain       ALLERGIES: Ingredient Reaction Medication Name Comment  MORPHINE     CODEINE     PENICILLIN     [place]      Reviewed, updated.    PHYSICAL EXAM:   Vitals Date Temp F BP Pulse Ht In Wt Lb BMI BSA Pain Score  12/14/2017  130/79 69 61 181 34.2  7/10      IMPRESSION:   Kathryn Romero is in good spirits, hopeful for successful surgery to relieve her lumbar pain and weakness.  PLAN:  Patient's questions were answered to her satisfaction.  She wishes to proceed with surgery as planned.   Assessment/Plan   # Detail Type Description   1. Assessment Low back pain (M54.5).       2. Assessment Spinal stenosis, lumbar region with neurogenic claudication (M48.062).       3. Assessment Spondylolisthesis, lumbar region (M43.16).                     Provider:  Marchia Meiers. Vertell Limber MD  12/14/2017 03:22 PM Dictation edited by: Mike Craze. Poteat RN    CC Providers: [place],  [place]  [id number]-   Richard Ramos  [address], [place] [place], [place]  [address]               Electronically signed by Marchia Meiers. Vertell Limber MD on 12/15/2017 07:04 PM  Patient ID:   [id number] Patient: Kathryn Romero  Date of Birth: 12/06/1935 Visit Type: Office Visit   Date: 09/29/2017 12:45 PM Provider: Marchia Meiers. Vertell Limber MD   This 82 year old female presents for back pain.  HISTORY OF PRESENT ILLNESS:  1.  back pain  Patient returns to review her MRI  Patient returns to review her lumbar MRI which shows fairly severe adjacent segment stenosis at the L2-3 level.  She describes that her legs give way.  She has decreasing ability to stand for any length of time.  She has continue to work on weight loss but has lost only 3 lb recently.  At this point given the patient's progressive worsening and weakness in her legs with significant adjacent segment stenosis, I have recommended proceeding with surgical intervention.  She has already tried injections and therapy and these are not helping her.  The surgical approach would be a right L2-3 XL IF with lateral plating.  The patient wants to consult the asked Astrologic signs before going ahead with surgery.  I have recommended scoliosis x-rays to assess for sagittal plane imbalance so that I can plan on correcting this as  much as possible.          PAST MEDICAL HISTORY, SURGICAL HISTORY, FAMILY HISTORY, SOCIAL HISTORY AND REVIEW OF SYSTEMS I have reviewed the patient's past medical, surgical, family and social history as well as the comprehensive review of systems as included on the [facility] history form dated 10/07/2015, which I have signed.   MEDICATIONS: (added, continued or stopped this visit) Started Medication Directions Instruction Stopped  07/26/2017 Mobic 7.5 mg tablet take 1 -2 tabs daily as needed for pain       ALLERGIES: Ingredient Reaction Medication Name Comment  MORPHINE     CODEINE     PENICILLIN         PHYSICAL EXAM:   Vitals Date  Temp F BP Pulse Ht In Wt Lb BMI BSA Pain Score  09/29/2017  172/79 75 61 185 34.96  4/10      IMPRESSION:   Progressive degenerative stenosis at L2-3 which is now significantly symptomatic for the patient.  PLAN:  Proceed with right-sided XL IF L2-3 level with lateral plating.  Risks and benefits were discussed in detail with the patient and she wishes to proceed with surgery.  She was fitted for new LSO brace today.  Orders: Diagnostic Procedures: Assessment Procedure  M43.16 Scoliosis- AP/Lat  Instruction(s)/Education: Assessment Instruction  R03.0 Hypertension education  Z68.34 Dietary management education, guidance, and counseling   Completed Orders (this encounter) Order Details Reason Side Interpretation Result Initial Treatment Date Region  Scoliosis- AP/Lat      09/29/2017 All Levels to All Levels  Dietary management education, guidance, and counseling Encouraged to eat well balanced diet and follow up with primary care physician        Hypertension education Continue to monitor blood pressure. If blood pressure remains elevated contact primary care         Assessment/Plan   # Detail Type Description   1. Assessment Radiculopathy, lumbar region (M54.16).       2. Assessment Low back pain (M54.5).       3. Assessment Spondylolisthesis, lumbar region (M43.16).       4. Assessment Spinal stenosis, lumbar region with neurogenic claudication (M48.062).       5. Assessment Body mass index (BMI) 34.0-34.9, adult (Z68.34).   Plan Orders Today's instructions / counseling include(s) Dietary management education, guidance, and counseling.       6. Assessment Elevated blood-pressure reading, w/o diagnosis of htn (R03.0).         Pain Management Plan Pain Scale: 4/10. Method: Numeric Pain Intensity Scale. Location: back. Onset: 08/07/2015. Duration: varies. Quality: discomforting. Pain management follow-up plan of care: Patient taking medications as  prescribed.              Provider:  Vertell Limber MD, Marchia Meiers 10/02/2017 1:48 PM  Dictation edited by: Marchia Meiers. Vertell Limber    CC Providers: [place],  [place]  [id number]   Richard Ramos  [address] [place] [address] [place],  [address]              Electronically signed by Marchia Meiers. Vertell Limber MD on 10/02/2017 01:48 PM

## 2018-01-06 NOTE — Brief Op Note (Signed)
01/06/2018  9:24 AM  PATIENT:  Kathryn Romero  82 y.o. female  PRE-OPERATIVE DIAGNOSIS:  Spinal stenosis, Lumbar region with neurogenic claudication, lumbago, radiculopathy L 23 level  POST-OPERATIVE DIAGNOSIS:  Spinal stenosis, Lumbar region with neurogenic claudication, lumbago, radiculopathy L 23 level  PROCEDURE:  Procedure(s) with comments: Right Lumbar Two-Three Anterolateral lumbar interbody fusion with lateral plate (Right) - Right Lumbar Two-Three Anterolateral lumbar interbody fusion with lateral plate  SURGEON:  Surgeon(s) and Role:    Erline Levine, MD - Primary  PHYSICIAN ASSISTANT:   ASSISTANTS: Poteat, RN   ANESTHESIA:   General  EBL:  25 mL   BLOOD ADMINISTERED:none  DRAINS: none   LOCAL MEDICATIONS USED:  MARCAINE    and LIDOCAINE   SPECIMEN:  No Specimen  DISPOSITION OF SPECIMEN:  N/A  COUNTS:  YES  TOURNIQUET:  * No tourniquets in log *  DICTATION: Patient is a 82 year old with severe spondylosis stenosis of the lumbar spine. It was elected to take her to surgery for anterolateral decompression and lateral plate fixation at the L 23 level.  She had previously undergone fusion L 3 - L 5 levels.  Procedure: Patient was brought to the operating room and placed in a left lateral decubitus position on the operative table and using orthogonally projected C-arm fluoroscopy the patient was placed so that the L 23  level was visualized in AP and lateral plane. The patient was then taped into position. The table was flexed so as to expose the L 23 level. Skin was marked along with a posterior finger dissection incision. Her flank was then prepped and draped in usual sterile fashion and incisions were made overlying the 12 th rib. Posterior finger dissection was made to enter the retroperitoneal space and then subsequently the probe was inserted into the psoas muscle from the right side initially at the L23 level. After mapping the neural elements were able to  dock the probe per the posterior aspect of this vertebral level and without indications electrically of too close proximity to the neural tissues. Subsequently the self-retaining tractor was.after sequential dilators were utilized the shim was employed and the interspace was cleared of psoas muscle and then incised. A thorough discectomy was performed. Instruments were used to clear the interspace of disc material. After thorough discectomy was performed and this was performed using AP and lateral fluoroscopy a 10 lordotic by 45 x 22 mm implant was packed with extra extra small BMP and Attrax. This was tamped into position using the slides and its position was confirmed on AP and lateral fluoroscopy. A Decade lateral plate (size 10) was affixed to the lateral spine with  5.5 x 45 mm bolts x 2. Both screws were torque locked and positioning was confirmed with AP and lateral fluoroscopy. . Hemostasis was assured the wounds were irrigated interrupted Vicryl sutures.Sterile occlusive dressing was placed with Dermabond. The patient was then extubated in the operating room and taken to recovery in stable and satisfactory condition having tolerated his operation well. Counts were correct at the end of the case.  PLAN OF CARE: Admit to inpatient   PATIENT DISPOSITION:  PACU - hemodynamically stable.   Delay start of Pharmacological VTE agent (>24hrs) due to surgical blood loss or risk of bleeding: yes

## 2018-01-06 NOTE — Anesthesia Postprocedure Evaluation (Signed)
Anesthesia Post Note  Patient: Kathryn Romero  Procedure(s) Performed: Right Lumbar Two-Three Anterolateral lumbar interbody fusion with lateral plate (Right )     Patient location during evaluation: PACU Anesthesia Type: General Level of consciousness: awake and alert Pain management: pain level controlled Vital Signs Assessment: post-procedure vital signs reviewed and stable Respiratory status: spontaneous breathing, nonlabored ventilation, respiratory function stable and patient connected to nasal cannula oxygen Cardiovascular status: blood pressure returned to baseline and stable Postop Assessment: no apparent nausea or vomiting Anesthetic complications: no    Last Vitals:  Vitals:   01/06/18 1030 01/06/18 1036  BP:  (!) 118/99  Pulse: 70 73  Resp: 13 18  Temp:    SpO2: 96% 96%    Last Pain:  Vitals:   01/06/18 1045  TempSrc:   PainSc: Asleep                 [name]

## 2018-01-06 NOTE — Progress Notes (Signed)
Patient ID: Kathryn Romero, female   DOB: January 02, 1936, 82 y.o.   MRN: [id number] Awakens to voice. Conversant, but still feels effects of anesthesia. Reports some back pain, no buttock or leg pain, and no numbness or tingling. Good strength BLE. Doing well. She states she forgot to bring her old LSO, but does not remember where it was stored. Will order new drawstring LSO. Given her hx of sensitivity to pain medications and muscle relaxants, she agrees to try Tramadol and Robaxin sparingly.

## 2018-01-06 NOTE — Anesthesia Procedure Notes (Signed)
Procedure Name: Intubation Date/Time: 01/06/2018 7:44 AM Performed by: Gwyndolyn Saxon, CRNA Pre-anesthesia Checklist: Patient identified, Emergency Drugs available, Suction available and Patient being monitored Patient Re-evaluated:Patient Re-evaluated prior to induction Oxygen Delivery Method: Circle system utilized Preoxygenation: Pre-oxygenation with 100% oxygen Induction Type: IV induction Ventilation: Mask ventilation without difficulty Laryngoscope Size: Miller and 2 Grade View: Grade I Tube type: Oral Tube size: 7.0 mm Number of attempts: 1 Airway Equipment and Method: Patient positioned with wedge pillow Placement Confirmation: ETT inserted through vocal cords under direct vision,  positive ETCO2,  CO2 detector and breath sounds checked- equal and bilateral Secured at: 20 cm Tube secured with: Tape Dental Injury: Teeth and Oropharynx as per pre-operative assessment

## 2018-01-07 ENCOUNTER — Other Ambulatory Visit: Payer: Self-pay

## 2018-01-07 MED ORDER — FAMOTIDINE 40 MG/5ML PO SUSR
20.0000 mg | Freq: Two times a day (BID) | ORAL | Status: DC
  Administered 2018-01-07 – 2018-01-10 (×7): 20 mg via ORAL
  Filled 2018-01-07 (×7): qty 2.5

## 2018-01-07 NOTE — Evaluation (Signed)
Occupational Therapy Evaluation Patient Details Name: Kathryn Romero MRN: [id number] DOB: [birth year] Today's Date: 01/07/2018    History of Present Illness Pt is a 82 y.o. female s/p L2-3 ALIF on 01/06/18. PMH includes legally blind, arthritis, anxiety, L3-5 PLIF (2017).   Clinical Impression   Pt admitted with above. She demonstrates the below listed deficits and will benefit from continued OT to maximize safety and independence with BADLs/  Pt currently requires supervision to min A for ADLs and min cues for back precautions.  She demonstrates good problem solving skills, but sometimes does not fully incorporate her back precautions.  She was mod I PTA with ADLs.  She reports family and friends will be providing 24 hour supervision at discharge.  Recommend HHOT.       Follow Up Recommendations  Home health OT;Supervision/Assistance - 24 hour    Equipment Recommendations  Tub/shower bench    Recommendations for Other Services       Precautions / Restrictions Precautions Precautions: Fall;Back;Other (comment) Precaution Comments: Legally blind Required Braces or Orthoses: Spinal Brace Spinal Brace: Lumbar corset;Applied in sitting position      Mobility Bed Mobility Overal bed mobility: Needs Assistance Bed Mobility: Rolling;Sidelying to Sit Rolling: Supervision Sidelying to sit: Supervision          Transfers Overall transfer level: Needs assistance Equipment used: Rolling walker (2 wheeled) Transfers: Sit to/from [facility] Sit to Stand: Supervision Stand pivot transfers: Supervision       General transfer comment: verbal cues for walker safety and to avoid twisting     Balance Overall balance assessment: Mild deficits observed, not formally tested                                         ADL either performed or assessed with clinical judgement   ADL Overall ADL's : Needs assistance/impaired Eating/Feeding: Modified  independent   Grooming: Wash/dry hands;Supervision/safety;Standing Grooming Details (indicate cue type and reason): verbal cues for safe walker placement  Upper Body Bathing: Supervision/ safety;Sitting   Lower Body Bathing: Moderate assistance;Sit to/from stand   Upper Body Dressing : Set up;Supervision/safety;Sitting   Lower Body Dressing: Supervision/safety;Sit to/from stand Lower Body Dressing Details (indicate cue type and reason): Pt is able to lasso pants over feet and scoot them up toward knees with her feet until she is able to reach them.  she thinks she may have  a reacher at home.  She does not wear socks and wears slip on shoes  Toilet Transfer: Supervision/safety;Ambulation;Comfort height toilet;Grab bars;RW   Toileting- Clothing Manipulation and Hygiene: Supervision/safety;Sit to/from stand Toileting - Clothing Manipulation Details (indicate cue type and reason): verbal cues for back precautions    Tub/Shower Transfer Details (indicate cue type and reason): Pt reports she sits on Edge of tub and swings legs in.  Discussed possible difficulties with this and that it may cause her to bed too far.   Functional mobility during ADLs: Supervision/safety;Rolling walker       Vision Baseline Vision/History: Legally blind Patient Visual Report: No change from baseline Additional Comments: Pt with h/o retinosa pigmentosa      Perception     Praxis      Pertinent Vitals/Pain Pain Assessment: Faces Faces Pain Scale: Hurts little more Pain Location: Back, abdomen (R lower quadrant) Pain Descriptors / Indicators: Sore Pain Intervention(s): Monitored during session;Repositioned     Hand Dominance Right  Extremity/Trunk Assessment Upper Extremity Assessment Upper Extremity Assessment: Overall WFL for tasks assessed   Lower Extremity Assessment Lower Extremity Assessment: Defer to PT evaluation       Communication Communication Communication: No difficulties    Cognition Arousal/Alertness: Awake/alert Behavior During Therapy: WFL for tasks assessed/performed Overall Cognitive Status: Within Functional Limits for tasks assessed                                 General Comments: Pt requires min cues for back precautions, primarily due to her previous modifications with daily activities due to visual deficits.   She demonstrates good memory and good problem solving skills    General Comments       Exercises     Shoulder Instructions      Home Living Family/patient expects to be discharged to:: Private residence Living Arrangements: Alone Available Help at Discharge: Friend(s);Available 24 hours/day;Family Type of Home: House Home Access: Stairs to enter [employer] of Steps: 3 Entrance Stairs-Rails: Right;Left Home Layout: One level     Bathroom Shower/Tub: Tub/shower unit         Home Equipment: Environmental consultant - 2 wheels;Cane - single point;Shower seat;Grab bars - tub/shower;Grab bars - toilet   Additional Comments: Friends will be staying initially with pt to provide 24/7 support      Prior Functioning/Environment Level of Independence: Independent with assistive device(s)        Comments: Mod indep with SPC. Legally blind, does not drive        OT Problem List: Decreased activity tolerance;Decreased safety awareness;Decreased knowledge of use of DME or AE;Decreased knowledge of precautions;Pain;Impaired vision/perception      OT Treatment/Interventions: Self-care/ADL training;DME and/or AE instruction;Therapeutic activities;Patient/family education;Visual/perceptual remediation/compensation    OT Goals(Current goals can be found in the care plan section) Acute Rehab OT Goals Patient Stated Goal: to get all better  OT Goal Formulation: With patient Time For Goal Achievement: 01/14/18 Potential to Achieve Goals: Good  OT Frequency: Min 2X/week   Barriers to D/C:            Co-evaluation               AM-PAC PT "6 Clicks" Daily Activity     Outcome Measure Help from another person eating meals?: None Help from another person taking care of personal grooming?: A Little Help from another person toileting, which includes using toliet, bedpan, or urinal?: A Little Help from another person bathing (including washing, rinsing, drying)?: A Little Help from another person to put on and taking off regular upper body clothing?: A Little Help from another person to put on and taking off regular lower body clothing?: A Little 6 Click Score: 19   End of Session Equipment Utilized During Treatment: Gait belt;Back brace;Rolling walker Nurse Communication: Mobility status  Activity Tolerance: Patient tolerated treatment well Patient left: in chair;with call bell/phone within reach  OT Visit Diagnosis: Unsteadiness on feet (R26.81);Pain Pain - part of body: (back)                Time: 7893-8101 OT Time Calculation (min): 29 min Charges:  OT General Charges $OT Visit: 1 Visit OT Evaluation $OT Eval Moderate Complexity: 1 Mod OT Treatments $Self Care/Home Management : 8-22 mins  [facility], OTR/L [phone]   Lucille Passy M 01/07/2018, 6:09 PM

## 2018-01-07 NOTE — Progress Notes (Signed)
OT Cancellation Note  Patient Details Name: Kathryn Romero MRN: [id number] DOB: 08/22/1935   Cancelled Treatment:    Reason Eval/Treat Not Completed: Pain limiting ability to participate;Fatigue/lethargy limiting ability to participate.  Will reattempt.  [place], OTR/L [phone]   Lucille Passy M 01/07/2018, 12:20 PM

## 2018-01-07 NOTE — Progress Notes (Signed)
Subjective: Patient reports sore in back  Objective: Vital signs in last 24 hours: Temp:  [97.5 F (36.4 C)-98.7 F (37.1 C)] 98 F (36.7 C) (08/31 0420) Pulse Rate:  [70-103] 87 (08/31 0420) Resp:  [10-20] 19 (08/31 0420) BP: (118-173)/(66-99) 144/66 (08/30 2322) SpO2:  [92 %-98 %] 98 % (08/31 0420) Weight:  [82.2 kg] 82.2 kg (08/30 1218)  Intake/Output from previous day: 08/30 0701 - 08/31 0700 In: 1271.9 [I.V.:1001.4; IV Piggyback:120.6] Out: 1950 [JSHFW:2637; Blood:25] Intake/Output this shift: No intake/output data recorded.  Physical Exam: Strength full, some right groin discomfort persists.  Dressings CDI  Lab Results: No results for input(s): WBC, HGB, HCT, PLT in the last 72 hours. BMET No results for input(s): NA, K, CL, CO2, GLUCOSE, BUN, CREATININE, CALCIUM in the last 72 hours.  Studies/Results: Dg Lumbar Spine 2-3 Views  Result Date: 01/06/2018 CLINICAL DATA:  L2-3 Xlif EXAM: DG C-ARM 61-120 MIN; LUMBAR SPINE - 2-3 VIEW COMPARISON:  09/20/2017 MRI FINDINGS: Numbering scheme based on preoperative MRI. Fluoroscopy was used for right lateral approach discectomy and fusion. Hardware is in unremarkable position. IMPRESSION: Fluoroscopy for L2-3 Xlif. Electronically Signed   By: Monte Fantasia M.D.   On: 01/06/2018 11:11   Dg C-arm 1-60 Min  Result Date: 01/06/2018 CLINICAL DATA:  L2-3 Xlif EXAM: DG C-ARM 61-120 MIN; LUMBAR SPINE - 2-3 VIEW COMPARISON:  09/20/2017 MRI FINDINGS: Numbering scheme based on preoperative MRI. Fluoroscopy was used for right lateral approach discectomy and fusion. Hardware is in unremarkable position. IMPRESSION: Fluoroscopy for L2-3 Xlif. Electronically Signed   By: Monte Fantasia M.D.   On: 01/06/2018 11:11    Assessment/Plan: Doing well POD 1.  Continue to mobilize with PT.    LOS: 1 day    Peggyann Shoals, MD 01/07/2018, 10:13 AM

## 2018-01-07 NOTE — Evaluation (Signed)
Physical Therapy Evaluation Patient Details Name: Kathryn Romero MRN: [id number] DOB: 03/25/1936 Today's Date: 01/07/2018   History of Present Illness  Pt is a 82 y.o. female s/p L2-3 ALIF on 01/06/18. PMH includes legally blind, arthritis, anxiety, L3-5 PLIF (2017).  Clinical Impression  Pt presents with an overall decrease in functional mobility secondary to above. PTA, pt mod indep with SPC; lives alone, but reports friends will be staying to help provide initial 24/7 assist. Educ on precautions, positioning, brace application, and importance of mobility. Today, pt able to amb short distance with RW and min guard. Pt would benefit from continued acute PT services to maximize functional mobility and independence prior to d/c with HHPT services.     Follow Up Recommendations Home health PT;Supervision/Assistance - 24 hour(declined SNF; reports will have 24/7 assist)    Equipment Recommendations  None recommended by PT    Recommendations for Other Services       Precautions / Restrictions Precautions Precautions: Fall;Back;Other (comment) Precaution Comments: Legally blind Required Braces or Orthoses: Spinal Brace Spinal Brace: Lumbar corset;Applied in sitting position Restrictions Weight Bearing Restrictions: No      Mobility  Bed Mobility Overal bed mobility: Needs Assistance Bed Mobility: Rolling;Sidelying to Sit Rolling: Supervision Sidelying to sit: Min assist       General bed mobility comments: Educ on log roll technique. MinA for HHA to assist trunk elevation  Transfers Overall transfer level: Needs assistance Equipment used: Rolling walker (2 wheeled) Transfers: Sit to/from Stand Sit to Stand: Supervision         General transfer comment: Performed multiple sit<>stands with RW and supervision for safety. Repeated cues for safe hand placement on RW  Ambulation/Gait Ambulation/Gait assistance: Min guard Gait Distance (Feet): 15 Feet Assistive device:  Rolling walker (2 wheeled) Gait Pattern/deviations: Step-through pattern;Decreased stride length Gait velocity: Decreased Gait velocity interpretation: <1.8 ft/sec, indicate of risk for recurrent falls General Gait Details: Slow, mildly unsteady amb with RW and min guard for balance; further distance limited by c/o nausea  Stairs            Wheelchair Mobility    Modified Rankin (Stroke Patients Only)       Balance Overall balance assessment: Needs assistance   Sitting balance-Leahy Scale: Good Sitting balance - Comments: Indep performing ADLs while sitting EOB     Standing balance-Leahy Scale: Fair Standing balance comment: Can static stand to adjust brace with no UE support                             Pertinent Vitals/Pain Pain Assessment: Faces Faces Pain Scale: Hurts little more Pain Location: Back, abdomen (R lower quadrant) Pain Descriptors / Indicators: Sore Pain Intervention(s): Monitored during session;Premedicated before session    Home Living Family/patient expects to be discharged to:: Private residence Living Arrangements: Alone Available Help at Discharge: Friend(s);Available 24 hours/day Type of Home: House Home Access: Stairs to enter Entrance Stairs-Rails: Psychiatric nurse of Steps: 3 Home Layout: One level Home Equipment: Walker - 2 wheels;Cane - single point;Shower seat;Grab bars - tub/shower Additional Comments: Friends will be staying initially with pt to provide 24/7 support    Prior Function Level of Independence: Independent with assistive device(s)         Comments: Mod indep with SPC. Legally blind, does not drive     Hand Dominance   Dominant Hand: Right    Extremity/Trunk Assessment   Upper Extremity Assessment Upper Extremity  Assessment: Overall WFL for tasks assessed    Lower Extremity Assessment Lower Extremity Assessment: Generalized weakness       Communication   Communication: No  difficulties  Cognition Arousal/Alertness: Awake/alert Behavior During Therapy: WFL for tasks assessed/performed Overall Cognitive Status: No family/caregiver present to determine baseline cognitive functioning Area of Impairment: Attention;Memory;Safety/judgement;Problem solving                   Current Attention Level: Selective Memory: Decreased recall of precautions;Decreased short-term memory   Safety/Judgement: Decreased awareness of safety   Problem Solving: Requires verbal cues General Comments: Tangential with speech and easily distracted; likely baseline, but could also be exacerbated by pain meds recently received      General Comments      Exercises     Assessment/Plan    PT Assessment Patient needs continued PT services  PT Problem List Decreased strength;Decreased activity tolerance;Decreased balance;Decreased mobility;Decreased knowledge of use of DME;Decreased knowledge of precautions       PT Treatment Interventions DME instruction;Stair training;Gait training;Functional mobility training;Therapeutic activities;Therapeutic exercise;Balance training;Patient/family education    PT Goals (Current goals can be found in the Care Plan section)  Acute Rehab PT Goals Patient Stated Goal: Return home with help from friends PT Goal Formulation: With patient Time For Goal Achievement: 01/21/18 Potential to Achieve Goals: Good    Frequency Min 5X/week   Barriers to discharge        Co-evaluation               AM-PAC PT "6 Clicks" Daily Activity  Outcome Measure Difficulty turning over in bed (including adjusting bedclothes, sheets and blankets)?: A Little Difficulty moving from lying on back to sitting on the side of the bed? : Unable Difficulty sitting down on and standing up from a chair with arms (e.g., wheelchair, bedside commode, etc,.)?: A Little Help needed moving to and from a bed to chair (including a wheelchair)?: A Little Help needed  walking in hospital room?: A Little Help needed climbing 3-5 steps with a railing? : A Little 6 Click Score: 16    End of Session Equipment Utilized During Treatment: Gait belt;Back brace Activity Tolerance: Other (comment)(Limited by nausea) Patient left: in chair;with call bell/phone within reach Nurse Communication: Mobility status PT Visit Diagnosis: Other abnormalities of gait and mobility (R26.89);Muscle weakness (generalized) (M62.81)    Time: 4854-6270 PT Time Calculation (min) (ACUTE ONLY): 26 min   Charges:   PT Evaluation $PT Eval Moderate Complexity: 1 Mod PT Treatments $Therapeutic Activity: 8-22 mins       Mabeline Caras, PT, DPT Acute Rehabilitation Services  Pager [phone] Office [place] 01/07/2018, 9:30 AM

## 2018-01-07 NOTE — Progress Notes (Signed)
Occupational Therapy Progress Note  Continued review of back precautions and safety with ADLs.  She frequently requires cues to avoid twisting.  She requires supervision, overall.  Recommend HHOT.    01/07/18 1900  OT Visit Information  Last OT Received On 01/07/18  Assistance Needed +1  History of Present Illness Pt is a 82 y.o. female s/p L2-3 ALIF on 01/06/18. PMH includes legally blind, arthritis, anxiety, L3-5 PLIF (2017).  Precautions  Precautions Fall;Back;Other (comment)  Precaution Comments Legally blind  Required Braces or Orthoses Spinal Brace  Spinal Brace Lumbar corset;Applied in sitting position  Pain Assessment  Pain Assessment Faces  Faces Pain Scale 4  Pain Location Back, abdomen (R lower quadrant)  Pain Descriptors / Indicators Sore  Pain Intervention(s) Monitored during session  Cognition  Arousal/Alertness Awake/alert  Behavior During Therapy WFL for tasks assessed/performed  Overall Cognitive Status Within Functional Limits for tasks assessed  General Comments Pt requires min cues for back precautions, primarily due to her previous modifications with daily activities due to visual deficits.   She demonstrates good memory and good problem solving skills   Upper Extremity Assessment  Upper Extremity Assessment Overall WFL for tasks assessed  Lower Extremity Assessment  Lower Extremity Assessment Defer to PT evaluation  ADL  Overall ADL's  Needs assistance/impaired  Grooming Oral care;Supervision/safety;Standing  Grooming Details (indicate cue type and reason) Pt instructed in correct method for brushing teeth   Lower Body Bathing Supervison/ safety;Sit to/from stand;With adaptive equipment  Lower Body Bathing Details (indicate cue type and reason) Pt reports she has a long handled bath sponge and was able to simulate LB bathing   Toilet Transfer Supervision/safety;Ambulation;Comfort height toilet;Grab bars;RW  Tub/Shower Transfer Details (indicate cue type and  reason) again reviewed safety with tub transfers and appropriate method for tub transfer   Functional mobility during ADLs Supervision/safety;Rolling walker  General ADL Comments Pt fatigues with activity.   continued to review precautions with pt as she will frequently twist without full awareness of doing so   Balance  Overall balance assessment Mild deficits observed, not formally tested  Vision- Assessment  Additional Comments Pt with h/o retinosa pigmentosa   Transfers  Overall transfer level Needs assistance  Equipment used Rolling walker (2 wheeled)  Transfers Sit to/from [employer] Transfers  Sit to Stand Supervision  Stand pivot transfers Supervision  General transfer comment verbal cues for hand placement, for walker safety and to avoid twisting   OT - End of Session  Equipment Utilized During Treatment Gait belt;Back brace;Rolling walker  Activity Tolerance Patient tolerated treatment well  Patient left in chair;with call bell/phone within reach  Nurse Communication Mobility status  OT Assessment/Plan  OT Plan Discharge plan remains appropriate  OT Visit Diagnosis Unsteadiness on feet (R26.81);Pain  Pain - part of body  (back)  OT Frequency (ACUTE ONLY) Min 2X/week  Follow Up Recommendations Home health OT;Supervision/Assistance - 24 hour  OT Equipment Tub/shower bench  AM-PAC OT "6 Clicks" Daily Activity Outcome Measure  Help from another person eating meals? 4  Help from another person taking care of personal grooming? 3  Help from another person toileting, which includes using toliet, bedpan, or urinal? 3  Help from another person bathing (including washing, rinsing, drying)? 3  Help from another person to put on and taking off regular upper body clothing? 3  Help from another person to put on and taking off regular lower body clothing? 3  6 Click Score 19  ADL G Code Conversion CK  OT Goal Progression  Progress towards OT goals Progressing toward goals  OT  Time Calculation  OT Start Time (ACUTE ONLY) 1811  OT Stop Time (ACUTE ONLY) 1834  OT Time Calculation (min) 23 min  OT General Charges  $OT Visit 1 Visit  OT Treatments  $Self Care/Home Management  23-37 mins  [facility], OTR/L [phone]

## 2018-01-08 NOTE — Progress Notes (Signed)
Neurosurgery Progress Note  No issues overnight. Complains of appropriate back soreness Tolerating po Voiding normal  EXAM:  BP (!) 121/59 (BP Location: Left Arm)   Pulse 71   Temp 98.4 F (36.9 C) (Oral)   Resp 20   Ht 5\' 1"  (1.549 m)   Wt 82.2 kg   SpO2 93%   BMI 34.24 kg/m   Awake, alert, oriented  Speech fluent, appropriate  CN grossly intact  MAEW with good strength Incision c/d/i  PLAN Doing well this am PT/OT initially rec SNF. I discussed with patient this am, and she would like to go to SNF. Reconsult CSW. Continue current care otherwise.

## 2018-01-08 NOTE — Progress Notes (Signed)
Occupational Therapy Treatment Patient Details Name: Kathryn Romero MRN: [id number] DOB: 1936-03-14 Today's Date: 01/08/2018    History of present illness Pt is a 82 y.o. female s/p L2-3 ALIF on 01/06/18. PMH includes legally blind, arthritis, anxiety, L3-5 PLIF (2017).   OT comments  Pt with increased pain this am 7/10 and resultant limitations in activity.   She requires min cues for back precautions likely due to visual deficit and not being able to visualize instructions. Or demonstration provided as she is able to recall where everything in her room is, and where she has placed items - this does not appear to be a memory deficit.  Today she is saying she needs rehab after hospitalization.   IF she does not have 24 hour assist available, strongly recommend SNF as it will take longer for her to fully incorporate back precautions, and will have to alter some of the compensation strategies she has in place for her vision loss.   IF she does have 24 hour supervision, she could discharge home with [facility] and PT.   Follow Up Recommendations  SNF;Supervision/Assistance - 24 hour    Equipment Recommendations  Tub/shower bench    Recommendations for Other Services      Precautions / Restrictions Precautions Precautions: Fall;Back;Other (comment) Precaution Comments: Legally blind Required Braces or Orthoses: Spinal Brace Spinal Brace: Lumbar corset;Applied in sitting position       Mobility Bed Mobility Overal bed mobility: Needs Assistance Bed Mobility: Sit to Sidelying Rolling: Supervision Sidelying to sit: Min guard     Sit to sidelying: Min guard General bed mobility comments: cues for sequencing   Transfers Overall transfer level: Needs assistance   Transfers: Sit to/from Stand;Stand Pivot Transfers Sit to Stand: Supervision Stand pivot transfers: Supervision       General transfer comment: verbal cues for hand placement    Balance Overall balance assessment: Mild  deficits observed, not formally tested   Sitting balance-Leahy Scale: Good       Standing balance-Leahy Scale: Fair Standing balance comment: can stand without UE support                           ADL either performed or assessed with clinical judgement   ADL Overall ADL's : Needs assistance/impaired       Grooming Details (indicate cue type and reason): Pt deferred due to increased pain                  Toilet Transfer: Supervision/safety;Ambulation;Comfort height toilet;Grab bars;RW   Toileting- Clothing Manipulation and Hygiene: Supervision/safety;Sit to/from stand       Functional mobility during ADLs: Supervision/safety;Rolling walker       Vision       Perception     Praxis      Cognition Arousal/Alertness: Awake/alert Behavior During Therapy: WFL for tasks assessed/performed Overall Cognitive Status: Within Functional Limits for tasks assessed Area of Impairment: Memory                     Memory: Decreased short-term memory         General Comments: cues for precautions with activity        Exercises     Shoulder Instructions       General Comments Pt now stating she may need rehab     Pertinent Vitals/ Pain       Pain Assessment: 0-10 Pain Score: 7  Pain Location: back  Pain Descriptors / Indicators: Sore Pain Intervention(s): Limited activity within patient's tolerance;Repositioned  Home Living                                          Prior Functioning/Environment              Frequency  Min 2X/week        Progress Toward Goals  OT Goals(current goals can now be found in the care plan section)  Progress towards OT goals: Progressing toward goals     Plan Discharge plan remains appropriate    Co-evaluation                 AM-PAC PT "6 Clicks" Daily Activity     Outcome Measure   Help from another person eating meals?: None Help from another person taking care  of personal grooming?: A Little Help from another person toileting, which includes using toliet, bedpan, or urinal?: A Little Help from another person bathing (including washing, rinsing, drying)?: A Little Help from another person to put on and taking off regular upper body clothing?: A Little Help from another person to put on and taking off regular lower body clothing?: A Little 6 Click Score: 19    End of Session Equipment Utilized During Treatment: Gait belt;Back brace;Rolling walker  OT Visit Diagnosis: Unsteadiness on feet (R26.81);Pain   Activity Tolerance Patient tolerated treatment well   Patient Left in chair;with call bell/phone within reach   Nurse Communication Mobility status        Time: 6314-9702 OT Time Calculation (min): 9 min  Charges: OT General Charges $OT Visit: 1 Visit OT Treatments $Self Care/Home Management : 8-22 mins  Lucille Passy, OTR/L [place] Pager [phone] Office [phone]    Lucille Passy M 01/08/2018, 10:17 AM

## 2018-01-08 NOTE — Progress Notes (Signed)
Physical Therapy Treatment Patient Details Name: Kathryn Romero MRN: [id number] DOB: 07-24-1935 Today's Date: 01/08/2018    History of Present Illness Pt is a 82 y.o. female s/p L2-3 ALIF on 01/06/18. PMH includes legally blind, arthritis, anxiety, L3-5 PLIF (2017).    PT Comments    Pt very pleasant and able to state all precautions on arrival. Pt educated for postioning and transfers to maintain precautions throughout activity. PT able to don brace without assist and progress gait without nausea this session. Encouraged mobility with nursing.    Follow Up Recommendations  Home health PT;Supervision/Assistance - 24 hour     Equipment Recommendations  None recommended by PT    Recommendations for Other Services       Precautions / Restrictions Precautions Precautions: Fall;Back;Other (comment) Precaution Comments: Legally blind Required Braces or Orthoses: Spinal Brace Spinal Brace: Lumbar corset;Applied in sitting position    Mobility  Bed Mobility Overal bed mobility: Needs Assistance Bed Mobility: Rolling;Sidelying to Sit Rolling: Supervision Sidelying to sit: Min guard       General bed mobility comments: cues for seqence with tactile cues to prevent twisting from bed flat without rail. On arrival pt with twisted trunk and educated for pillow and back position at rest  Transfers     Transfers: Sit to/from Stand Sit to Stand: Supervision         General transfer comment: verbal cues for hand placement  Ambulation/Gait Ambulation/Gait assistance: Supervision Gait Distance (Feet): 100 Feet Assistive device: Rolling walker (2 wheeled) Gait Pattern/deviations: Step-through pattern;Decreased stride length   Gait velocity interpretation: >2.62 ft/sec, indicative of community ambulatory General Gait Details: steady gait with use of RW, cues for directions   Stairs Stairs: Yes Stairs assistance: Min guard Stair Management: One rail Left;Step to  pattern;Forwards Number of Stairs: 3 General stair comments: cues to not twist with ascent as pt placing both hands on left rail. slow steady sequence with step to pattern   Wheelchair Mobility    Modified Rankin (Stroke Patients Only)       Balance Overall balance assessment: Mild deficits observed, not formally tested   Sitting balance-Leahy Scale: Good       Standing balance-Leahy Scale: Fair Standing balance comment: can stand without UE support                            Cognition Arousal/Alertness: Awake/alert Behavior During Therapy: WFL for tasks assessed/performed   Area of Impairment: Memory                     Memory: Decreased short-term memory         General Comments: cues for precautions with activity      Exercises      General Comments        Pertinent Vitals/Pain Pain Score: 3  Pain Location: neck Pain Descriptors / Indicators: Sore Pain Intervention(s): Limited activity within patient's tolerance    Home Living                      Prior Function            PT Goals (current goals can now be found in the care plan section) Progress towards PT goals: Progressing toward goals    Frequency           PT Plan Current plan remains appropriate    Co-evaluation  AM-PAC PT "6 Clicks" Daily Activity  Outcome Measure  Difficulty turning over in bed (including adjusting bedclothes, sheets and blankets)?: A Little Difficulty moving from lying on back to sitting on the side of the bed? : A Little Difficulty sitting down on and standing up from a chair with arms (e.g., wheelchair, bedside commode, etc,.)?: A Little Help needed moving to and from a bed to chair (including a wheelchair)?: A Little Help needed walking in hospital room?: A Little Help needed climbing 3-5 steps with a railing? : A Little 6 Click Score: 18    End of Session Equipment Utilized During Treatment: Gait  belt;Back brace Activity Tolerance: Patient tolerated treatment well Patient left: in chair;with call bell/phone within reach Nurse Communication: Mobility status PT Visit Diagnosis: Other abnormalities of gait and mobility (R26.89);Muscle weakness (generalized) (M62.81)     Time: 3013-1438 PT Time Calculation (min) (ACUTE ONLY): 23 min  Charges:  $Gait Training: 8-22 mins $Therapeutic Activity: 8-22 mins                     [place], PT Acute Rehabilitation Services Pager: [phone] Office: [place] 01/08/2018, 8:57 AM

## 2018-01-09 NOTE — Social Work (Addendum)
CSW acknowledging consult for SNF placement, have initiated insurance with [employer].   12:24pm- Pt has selected [facility], they will be able to admit tomorrow as soon as authorization has been received.   [place], [place] Work [phone]

## 2018-01-09 NOTE — Clinical Social Work Note (Signed)
Clinical Social Work Assessment  Patient Details  Name: Kathryn Romero MRN: [id number] Date of Birth: [birth year]  Date of referral:  01/09/18               Reason for consult:  Facility Placement, Discharge Planning                Permission sought to share information with:  Facility Sport and exercise psychologist, Family Supports Permission granted to share information::  Yes, Verbal Permission Granted  Name::     Inez Catalina   Agency::   [facility]  Relationship::   brother  Contact Information:   [phone]  Housing/Transportation Living arrangements for the past 2 months:  [place] of Information:  Patient Patient Interpreter Needed:  None Criminal Activity/Legal Involvement Pertinent to Current Situation/Hospitalization:  No - Comment as needed Significant Relationships:  None Lives with:  Self Do you feel safe going back to the place where you live?  Yes Need for family participation in patient care:  Yes (Comment)  Care giving concerns:  Pt lives at home alone, feels like she needs more assistance at discharge.    Social Worker assessment / plan:  CSW spoke with pt at bedside, pt would like Publishing copy for SNF. Pt states she has been there before after a back surgery and since she lives alone and is legally blind she feels like it is best for her to able to rehab at SNF rather than home.   Pt is originally from [place] and moved down to [place], she is close to her brother who lives in [place] but usually manages well at home, she is looking forward to returning there post rehab.   Employment status:  Retired Nurse, adult PT Recommendations:  [place], [place] / Referral to community resources:  [place]  Patient/Family's Response to care:  Pt understands CSW role and SNF referral process, awaits SNF prior to returning home.   Patient/Family's Understanding of  and Emotional Response to Diagnosis, Current Treatment, and Prognosis: Pt states understanding of diagnosis, current treatment and prognosis. Pt understanding of limitations and current needs at discharge. She expresses reasonable expectations for SNF and was emotionally appropriate throughout assessment.   Emotional Assessment Appearance:  Appears stated age Attitude/Demeanor/Rapport:  Engaged, Self-Confident, Gracious Affect (typically observed):  Accepting, Adaptable, Appropriate Orientation:  Oriented to Self, Oriented to Place, Oriented to  Time, Oriented to Situation Alcohol / Substance use:  Not Applicable Psych involvement (Current and /or in the community):  No (Comment)  Discharge Needs  Concerns to be addressed:   Care Coordination Readmission within the last 30 days:  No Current discharge risk:  Lives alone, Physical Impairment Barriers to Discharge:  Ship broker, Continued Medical Work up   [employer], [place] 01/09/2018, 12:24 PM

## 2018-01-09 NOTE — Progress Notes (Signed)
Neurosurgery Progress Note  No issues overnight. Mild back soreness. Feels more comfortable with ambulation Eager for rehab  EXAM:  BP (!) 153/68 (BP Location: Left Arm)   Pulse 64   Temp 97.8 F (36.6 C) (Oral)   Resp 16   Ht 5\' 1"  (1.549 m)   Wt 82.2 kg   SpO2 94%   BMI 34.24 kg/m   Awake, alert, oriented  Speech fluent, appropriate  CN grossly intact  5/5 BUE/BLE  Incision c/d/i  PLAN Doing well this am PT/OT rec SNF. Patient wishes to pursue. CSW initiated insurance auth. Dispo planning. Continue current care.

## 2018-01-09 NOTE — NC FL2 (Signed)
[place] MEDICAID FL2 LEVEL OF CARE SCREENING TOOL     IDENTIFICATION  Patient Name: Kathryn Romero Birthdate: May 12, 1935 Sex: female Admission Date (Current Location): 01/06/2018  [facility] and [place] Number:  Herbalist and Address:  [facility], [place] [address], [place], [place] [address]      Provider Number: [id number]  Attending Physician Name and Address:  Erline Levine, MD  Relative Name and Phone Number:       Current Level of Care: Hospital Recommended Level of Care: [place] Prior Approval Number:    Date Approved/Denied:   PASRR Number: [id number]  Discharge Plan: SNF    Current Diagnoses: Patient Active Problem List   Diagnosis Date Noted  . Degenerative lumbar spinal stenosis 01/06/2018  . Spondylolisthesis at L4-L5 level 11/14/2015  . DOE (dyspnea on exertion) 10/14/2014  . Fatigue 09/19/2014  . Obese 05/20/2014  . RETINITIS PIGMENTOSA 12/04/2008  . GLUCOSE INTOLERANCE 05/09/2007  . Dyslipidemia 05/09/2007  . ANEMIA-NOS 05/09/2007  . ANXIETY 05/09/2007  . Essential hypertension 05/09/2007  . DIVERTICULOSIS, COLON 05/09/2007  . BACK PAIN 05/09/2007  . History of malignant neoplasm of large intestine 05/09/2007  . ARTHRITIS, TRAUMATIC, UNSPECIFIED SITE 12/17/2006    Orientation RESPIRATION BLADDER Height & Weight     Self, Time, Situation, Place  Normal Continent Weight: 181 lb 3.5 oz (82.2 kg) Height:  5\' 1"  (154.9 cm)  BEHAVIORAL SYMPTOMS/MOOD NEUROLOGICAL BOWEL NUTRITION STATUS      Continent Diet(see discharge summary)  AMBULATORY STATUS COMMUNICATION OF NEEDS Skin   Limited Assist Verbally Surgical wounds(incision on flank with honeycomb dressing)                       Personal Care Assistance Level of Assistance  Bathing, Feeding, Dressing Bathing Assistance: Limited assistance Feeding assistance: Independent Dressing Assistance: Limited assistance     Functional  Limitations Info  Sight, Hearing, Speech Sight Info: Impaired(legally blind) Hearing Info: Adequate Speech Info: Adequate    SPECIAL CARE FACTORS FREQUENCY  PT (By licensed PT), OT (By licensed OT)     PT Frequency: 5x week OT Frequency: 5x week            Contractures Contractures Info: Not present    Additional Factors Info  Code Status, Allergies Code Status Info: Full Code Allergies Info: CODEINE, HYDROMET HYDROCODONE-HOMATROPINE, FISH ALLERGY, MORPHINE, SULFA ANTIBIOTICS            Current Medications (01/09/2018):  This is the current hospital active medication list Current Facility-Administered Medications  Medication Dose Route Frequency Provider Last Rate Last Dose  . 0.9 %  sodium chloride infusion  250 mL Intravenous Continuous Erline Levine, MD   Stopped at 01/06/18 1530  . acetaminophen (TYLENOL) tablet 650 mg  650 mg Oral Q4H PRN Erline Levine, MD       Or  . acetaminophen (TYLENOL) suppository 650 mg  650 mg Rectal Q4H PRN Erline Levine, MD      . alum & mag hydroxide-simeth (MAALOX/MYLANTA) 200-200-20 MG/5ML suspension 30 mL  30 mL Oral Q6H PRN Erline Levine, MD      . B-complex with vitamin C tablet 1 tablet  1 tablet Oral Daily Erline Levine, MD   1 tablet at 01/08/18 1008  . bisacodyl (DULCOLAX) suppository 10 mg  10 mg Rectal Daily PRN Erline Levine, MD      . cholecalciferol (VITAMIN D) tablet 3,000 Units  3,000 Units Oral Daily Erline Levine, MD  3,000 Units at 01/09/18 0944  . dextrose 5 % and 0.45 % NaCl with KCl 20 mEq/L infusion   Intravenous Continuous Erline Levine, MD   Stopped at 01/07/18 [phone]  . docusate sodium (COLACE) capsule 100 mg  100 mg Oral BID Erline Levine, MD   100 mg at 01/09/18 0945  . famotidine (PEPCID) 40 MG/5ML suspension 20 mg  20 mg Oral BID Erline Levine, MD   20 mg at 01/09/18 0944  . ketorolac (TORADOL) 15 MG/ML injection 15 mg  15 mg Intravenous Q6H PRN Erline Levine, MD   15 mg at 01/08/18 3491  . menthol-cetylpyridinium  (CEPACOL) lozenge 3 mg  1 lozenge Oral PRN Erline Levine, MD       Or  . phenol [facility]) mouth spray 1 spray  1 spray Mouth/Throat PRN Erline Levine, MD      . methocarbamol (ROBAXIN) tablet 500 mg  500 mg Oral Q6H PRN Erline Levine, MD   500 mg at 01/08/18 2004   Or  . methocarbamol (ROBAXIN) 500 mg in dextrose 5 % 50 mL IVPB  500 mg Intravenous Q6H PRN Erline Levine, MD   Stopped at 01/07/18 0745  . ondansetron (ZOFRAN) tablet 4 mg  4 mg Oral Q6H PRN Erline Levine, MD       Or  . ondansetron [facility]) injection 4 mg  4 mg Intravenous Q6H PRN Erline Levine, MD   4 mg at 01/06/18 1541  . polyethylene glycol (MIRALAX / GLYCOLAX) packet 17 g  17 g Oral Daily PRN Erline Levine, MD      . sodium chloride flush (NS) 0.9 % injection 3 mL  3 mL Intravenous Q12H Erline Levine, MD   3 mL at 01/09/18 0946  . sodium chloride flush (NS) 0.9 % injection 3 mL  3 mL Intravenous PRN Erline Levine, MD      . sodium phosphate (FLEET) 7-19 GM/118ML enema 1 enema  1 enema Rectal Once PRN Erline Levine, MD      . traMADol [facility]at[facility]) tablet 50 mg  50 mg Oral Q6H PRN Erline Levine, MD   50 mg at 01/09/18 0429  . zolpidem (AMBIEN) tablet 5 mg  5 mg Oral QHS PRN Erline Levine, MD         Discharge Medications: Please see discharge summary for a list of discharge medications.  Relevant Imaging Results:  Relevant Lab Results:   Additional Information [address] [place], [place]

## 2018-01-09 NOTE — Care Management Important Message (Signed)
Important Message  Patient Details  Name: Kathryn Romero MRN: [id number] Date of Birth: 1936-02-18   Medicare Important Message Given:  Yes    [name] 01/09/2018, 3:33 PM

## 2018-01-09 NOTE — Progress Notes (Signed)
Physical Therapy Treatment Patient Details Name: Kathryn Romero MRN: [id number] DOB: 1936-01-07 Today's Date: 01/09/2018    History of Present Illness Pt is a 82 y.o. female s/p L2-3 ALIF on 01/06/18. PMH includes legally blind, arthritis, anxiety, L3-5 PLIF (2017).    PT Comments    Patient progressing well towards PT goals. Improved ambulation distance today with cues due to vision impairment. Able to recall 3/3 back precautions but needs cues to adhere to them during mobility. Anxious to get to rehab due to living alone. Encouraged increasing mobility while in the hospital. Will follow.    Follow Up Recommendations  SNF     Equipment Recommendations  None recommended by PT    Recommendations for Other Services       Precautions / Restrictions Precautions Precautions: Fall;Back;Other (comment) Precaution Comments: Legally blind Required Braces or Orthoses: Spinal Brace Spinal Brace: Lumbar corset;Applied in sitting position Restrictions Weight Bearing Restrictions: No    Mobility  Bed Mobility               General bed mobility comments: up in chair upon PT arrival.   Transfers Overall transfer level: Needs assistance Equipment used: Rolling walker (2 wheeled) Transfers: Sit to/from Stand Sit to Stand: Supervision         General transfer comment: verbal cues for hand placement  Ambulation/Gait Ambulation/Gait assistance: Supervision Gait Distance (Feet): 120 Feet Assistive device: Rolling walker (2 wheeled) Gait Pattern/deviations: Step-through pattern;Decreased stride length Gait velocity: Decreased   General Gait Details: steady gait with use of RW, cues for directions due to blindness.   Stairs             Wheelchair Mobility    Modified Rankin (Stroke Patients Only)       Balance Overall balance assessment: Mild deficits observed, not formally tested Sitting-balance support: Feet supported;No upper extremity supported Sitting  balance-Leahy Scale: Good     Standing balance support: During functional activity Standing balance-Leahy Scale: Fair Standing balance comment: can stand without UE support but does better with UE support for dynamic tasks.                            Cognition Arousal/Alertness: Awake/alert Behavior During Therapy: WFL for tasks assessed/performed Overall Cognitive Status: Within Functional Limits for tasks assessed                                        Exercises      General Comments        Pertinent Vitals/Pain Pain Assessment: No/denies pain    Home Living                      Prior Function            PT Goals (current goals can now be found in the care plan section) Progress towards PT goals: Progressing toward goals    Frequency    Min 5X/week      PT Plan Discharge plan needs to be updated    Co-evaluation              AM-PAC PT "6 Clicks" Daily Activity  Outcome Measure  Difficulty turning over in bed (including adjusting bedclothes, sheets and blankets)?: None Difficulty moving from lying on back to sitting on the side of the bed? : None Difficulty sitting  down on and standing up from a chair with arms (e.g., wheelchair, bedside commode, etc,.)?: A Little Help needed moving to and from a bed to chair (including a wheelchair)?: A Little Help needed walking in hospital room?: A Little Help needed climbing 3-5 steps with a railing? : A Little 6 Click Score: 20    End of Session Equipment Utilized During Treatment: Gait belt;Back brace Activity Tolerance: Patient tolerated treatment well Patient left: in chair;with call bell/phone within reach Nurse Communication: Mobility status PT Visit Diagnosis: Other abnormalities of gait and mobility (R26.89);Muscle weakness (generalized) (M62.81)     Time: 5732-2025 PT Time Calculation (min) (ACUTE ONLY): 22 min  Charges:  $Gait Training: 8-22 mins                      Wray Kearns, PT, DPT Acute Rehabilitation Services Pager [phone] Office [place] 01/09/2018, 11:00 AM

## 2018-01-09 NOTE — Progress Notes (Signed)
Occupational Therapy Treatment Patient Details Name: Kathryn Romero MRN: [id number] DOB: [birth year] Today's Date: 01/09/2018    History of present illness Pt is a 82 y.o. female s/p L2-3 ALIF on 01/06/18. PMH includes legally blind, arthritis, anxiety, L3-5 PLIF (2017).   OT comments  Pt continues to require cues throughout session for back precautions - especially when in familiar tasks like ADL. Today she was able to complete transfers, sink level grooming and education provided for incorporating compensatory techniques at sink level. Pt continues to require SNF level care at dc to allow for extra time due to visual deficits and feel that the SNF environment will not only be the safest, but it will maximize her independence in ADL and functional transfers/use of AE and DME.   Follow Up Recommendations  SNF;Supervision/Assistance - 24 hour    Equipment Recommendations  Tub/shower bench    Recommendations for Other Services      Precautions / Restrictions Precautions Precautions: Fall;Back;Other (comment) Precaution Comments: Legally blind Required Braces or Orthoses: Spinal Brace Spinal Brace: Lumbar corset;Applied in sitting position Restrictions Weight Bearing Restrictions: No       Mobility Bed Mobility Overal bed mobility: Needs Assistance Bed Mobility: Rolling;Sidelying to Sit Rolling: Supervision Sidelying to sit: Min guard       General bed mobility comments: vc for technique, use of bed rails to push up  Transfers Overall transfer level: Needs assistance Equipment used: Rolling walker (2 wheeled) Transfers: Sit to/from Stand Sit to Stand: Supervision         General transfer comment: verbal cues for hand placement    Balance Overall balance assessment: Needs assistance Sitting-balance support: Feet supported;No upper extremity supported Sitting balance-Leahy Scale: Good     Standing balance support: During functional activity;Bilateral upper  extremity supported;No upper extremity supported Standing balance-Leahy Scale: Fair Standing balance comment: can stand without UE support but does better with UE support for dynamic tasks.                           ADL either performed or assessed with clinical judgement   ADL Overall ADL's : Needs assistance/impaired     Grooming: Oral care;Standing;Cueing for compensatory techniques;Minimal assistance Grooming Details (indicate cue type and reason): educated on cup method, talked about getting different texture cups for spit and water     Lower Body Bathing: Supervison/ safety;Sit to/from stand;With adaptive equipment   Upper Body Dressing : Set up;Supervision/safety;Sitting Upper Body Dressing Details (indicate cue type and reason): to don brace EOB     Toilet Transfer: Supervision/safety;Ambulation;Comfort height toilet;Grab bars;RW Armed forces technical officer Details (indicate cue type and reason): navigating environment [place] and Hygiene: Supervision/safety;Sit to/from stand Toileting - Clothing Manipulation Details (indicate cue type and reason): verbal cues for back precautions      Functional mobility during ADLs: Supervision/safety;Rolling walker General ADL Comments: Pt fatigues with activity, more pain this morning than with PT session this AM. continued to review precautions with pt as she will frequently twist without full awareness of doing so      Vision       Perception     Praxis      Cognition Arousal/Alertness: Awake/alert Behavior During Therapy: WFL for tasks assessed/performed Overall Cognitive Status: Within Functional Limits for tasks assessed  General Comments: cues for precautions within functional activities        Exercises     Shoulder Instructions       General Comments      Pertinent Vitals/ Pain       Pain Assessment: 0-10 Pain Score: 6  Pain Location:  back  Pain Descriptors / Indicators: Sore Pain Intervention(s): Limited activity within patient's tolerance;Monitored during session;Repositioned  Home Living                                          Prior Functioning/Environment              Frequency  Min 2X/week        Progress Toward Goals  OT Goals(current goals can now be found in the care plan section)  Progress towards OT goals: Progressing toward goals  Acute Rehab OT Goals Patient Stated Goal: to get all better  OT Goal Formulation: With patient Time For Goal Achievement: 01/14/18 Potential to Achieve Goals: Good ADL Goals Pt Will Perform Grooming: with supervision;standing Pt Will Perform Upper Body Bathing: with supervision;sitting Pt Will Perform Lower Body Bathing: with supervision;sit to/from stand Pt Will Perform Upper Body Dressing: with supervision;sitting Pt Will Perform Lower Body Dressing: with supervision;sit to/from stand Pt Will Transfer to Toilet: with supervision;ambulating;regular height toilet;bedside commode;grab bars Pt Will Perform Toileting - Clothing Manipulation and hygiene: with supervision;sit to/from stand  Plan Discharge plan remains appropriate    Co-evaluation                 AM-PAC PT "6 Clicks" Daily Activity     Outcome Measure   Help from another person eating meals?: None Help from another person taking care of personal grooming?: A Little Help from another person toileting, which includes using toliet, bedpan, or urinal?: A Little Help from another person bathing (including washing, rinsing, drying)?: A Little Help from another person to put on and taking off regular upper body clothing?: A Little Help from another person to put on and taking off regular lower body clothing?: A Little 6 Click Score: 19    End of Session Equipment Utilized During Treatment: Gait belt;Back brace;Rolling walker  OT Visit Diagnosis: Unsteadiness on feet  (R26.81);Pain Pain - part of body: (back)   Activity Tolerance Patient tolerated treatment well   Patient Left in chair;with call bell/phone within reach   Nurse Communication Mobility status        Time: 0938-1829 OT Time Calculation (min): 18 min  Charges: OT General Charges $OT Visit: 1 Visit OT Treatments $Self Care/Home Management : 8-22 mins  Hulda Humphrey OTR/L Acute Rehabilitation Services Pager: [phone] Office: [place] 01/09/2018, 5:20 PM

## 2018-01-10 ENCOUNTER — Encounter (HOSPITAL_COMMUNITY): Payer: Self-pay | Admitting: Neurosurgery

## 2018-01-10 DIAGNOSIS — R2689 Other abnormalities of gait and mobility: Secondary | ICD-10-CM | POA: Diagnosis not present

## 2018-01-10 DIAGNOSIS — K5903 Drug induced constipation: Secondary | ICD-10-CM | POA: Diagnosis not present

## 2018-01-10 DIAGNOSIS — R110 Nausea: Secondary | ICD-10-CM | POA: Diagnosis not present

## 2018-01-10 DIAGNOSIS — Z4889 Encounter for other specified surgical aftercare: Secondary | ICD-10-CM | POA: Diagnosis not present

## 2018-01-10 DIAGNOSIS — M6281 Muscle weakness (generalized): Secondary | ICD-10-CM | POA: Diagnosis not present

## 2018-01-10 DIAGNOSIS — K5900 Constipation, unspecified: Secondary | ICD-10-CM | POA: Diagnosis not present

## 2018-01-10 DIAGNOSIS — M4326 Fusion of spine, lumbar region: Secondary | ICD-10-CM | POA: Diagnosis not present

## 2018-01-10 DIAGNOSIS — R41841 Cognitive communication deficit: Secondary | ICD-10-CM | POA: Diagnosis not present

## 2018-01-10 DIAGNOSIS — H3552 Pigmentary retinal dystrophy: Secondary | ICD-10-CM | POA: Diagnosis not present

## 2018-01-10 DIAGNOSIS — Z743 Need for continuous supervision: Secondary | ICD-10-CM | POA: Diagnosis not present

## 2018-01-10 DIAGNOSIS — M4316 Spondylolisthesis, lumbar region: Secondary | ICD-10-CM | POA: Diagnosis not present

## 2018-01-10 DIAGNOSIS — M549 Dorsalgia, unspecified: Secondary | ICD-10-CM | POA: Diagnosis not present

## 2018-01-10 DIAGNOSIS — M25551 Pain in right hip: Secondary | ICD-10-CM | POA: Diagnosis not present

## 2018-01-10 DIAGNOSIS — R279 Unspecified lack of coordination: Secondary | ICD-10-CM | POA: Diagnosis not present

## 2018-01-10 MED ORDER — METHOCARBAMOL 500 MG PO TABS: 500.0000 mg | ORAL_TABLET | Freq: Four times a day (QID) | ORAL | 1 refills | Status: DC | PRN

## 2018-01-10 MED ORDER — TRAMADOL HCL 50 MG PO TABS: 50.0000 mg | ORAL_TABLET | Freq: Four times a day (QID) | ORAL | 1 refills | Status: DC | PRN

## 2018-01-10 NOTE — Social Work (Signed)
Clinical Social Worker facilitated patient discharge including contacting patient family and facility to confirm patient discharge plans.  Clinical information faxed to facility and family agreeable with plan.  CSW arranged ambulance transport via [facility] to [facility].  RN to call [phone] with report  prior to discharge.  Clinical Social Worker will sign off for now as social work intervention is no longer needed. Please consult [place] again if new need arises.  Alexander Mt, [place] Social Worker [phone]

## 2018-01-10 NOTE — Social Work (Signed)
Pt has been approved for 7 days; auth [id number] whenever pt summary available will facilitate d/c to [facility]. CSW has f/u with Aaron Edelman, Therapist, sports.  Alexander Mt, [place] Work [phone]

## 2018-01-10 NOTE — Progress Notes (Signed)
Occupational Therapy Treatment Patient Details Name: Kathryn Romero MRN: [id number] DOB: 29-Apr-1936 Today's Date: 01/10/2018    History of present illness Pt is a 82 y.o. female s/p L2-3 ALIF on 01/06/18. PMH includes legally blind, arthritis, anxiety, L3-5 PLIF (2017).   OT comments  This 82 yo female admitted and underwent above presents to acute OT with making progress with B/D she will continue to benefit from acute OT with follow up at SNF to get to a Mod I to go home.   Follow Up Recommendations  SNF;Supervision/Assistance - 24 hour    Equipment Recommendations  Tub/shower bench       Precautions / Restrictions Precautions Precautions: Fall;Back Precaution Comments: Legally blind. Pt able to state all 3 back precautions Required Braces or Orthoses: Spinal Brace Spinal Brace: Lumbar corset;Applied in sitting position Restrictions Weight Bearing Restrictions: No Other Position/Activity Restrictions: Can have brace off for quick trips to bathroom if then going back to bed, can have brace off for showering, and should have brace off when in bed       Mobility Bed Mobility               General bed mobility comments: Pt up in recliner upon my arrival  Transfers Overall transfer level: Needs assistance Equipment used: Rolling walker (2 wheeled) Transfers: Sit to/from Stand Sit to Stand: Supervision              Balance Overall balance assessment: Needs assistance Sitting-balance support: No upper extremity supported;Feet supported Sitting balance-Leahy Scale: Good     Standing balance support: No upper extremity supported;During functional activity   Standing balance comment: standing to dry self, pull down/up pants, adjust back brace                           ADL either performed or assessed with clinical judgement   ADL Overall ADL's : Needs assistance/impaired     Grooming: Wash/dry hands;Wash/dry face;Brushing hair;Set  up;Sitting Grooming Details (indicate cue type and reason): In shower Upper Body Bathing: Supervision/ safety;Set up;Sitting Upper Body Bathing Details (indicate cue type and reason): in shower Lower Body Bathing: Minimal assistance Lower Body Bathing Details (indicate cue type and reason): S sit<>stand, A for 1/2 of lower legs and feet without AE in shower Upper Body Dressing : Set up;Sitting Upper Body Dressing Details (indicate cue type and reason): Can don brace independently once handed to her Lower Body Dressing: Supervision/safety;Set up;Sit to/from stand Lower Body Dressing Details (indicate cue type and reason): for underwear, pants, flip flops, but needs A if wearing socks and lace up shoes Toilet Transfer: Supervision/safety;Ambulation;RW Toilet Transfer Details (indicate cue type and reason): 3n1 over toilet     Tub/ Shower Transfer: Supervision/safety;Ambulation;3 in 1;Walk-in shower           Vision Baseline Vision/History: Legally blind Patient Visual Report: No change from baseline            Cognition Arousal/Alertness: Awake/alert Behavior During Therapy: WFL for tasks assessed/performed Overall Cognitive Status: Within Functional Limits for tasks assessed                                                     Pertinent Vitals/ Pain       Pain Assessment: No/denies pain  Frequency  Min 2X/week        Progress Toward Goals  OT Goals(current goals can now be found in the care plan section)  Progress towards OT goals: Progressing toward goals     Plan Discharge plan remains appropriate       AM-PAC PT "6 Clicks" Daily Activity     Outcome Measure   Help from another person eating meals?: None Help from another person taking care of personal grooming?: A Little Help from another person toileting, which includes using toliet, bedpan, or urinal?: A Little Help from another person bathing (including washing, rinsing,  drying)?: A Little Help from another person to put on and taking off regular upper body clothing?: A Little Help from another person to put on and taking off regular lower body clothing?: A Little 6 Click Score: 19    End of Session Equipment Utilized During Treatment: Rolling walker;Back brace  OT Visit Diagnosis: Unsteadiness on feet (R26.81)   Activity Tolerance Patient tolerated treatment well   Patient Left in chair;with call bell/phone within reach   Nurse Communication (Pt completed shower)        Time: 0272-5366 OT Time Calculation (min): 47 min  Charges: OT General Charges $OT Visit: 1 Visit OT Treatments $Self Care/Home Management : 38-52 mins  [place], OTR/L [place] Pager [phone] Office [phone]

## 2018-01-10 NOTE — Progress Notes (Signed)
Attempted report to [facility], left voice message with Supervisor at [phone]. Awaiting call back.

## 2018-01-10 NOTE — Discharge Summary (Signed)
Physician Discharge Summary  Patient ID: Kathryn Romero MRN: [id number] DOB/AGE: [birth year] 82 y.o.  Admit date: 01/06/2018 Discharge date: 01/10/2018  Admission Diagnoses: Spinal stenosis, Lumbar region with neurogenic claudication, lumbago, radiculopathy L 23 level    Discharge Diagnoses: Spinal stenosis, Lumbar region with neurogenic claudication, lumbago, radiculopathy L 23 level s/p Right Lumbar Two-Three Anterolateral lumbar interbody fusion with lateral plate (Right) - Right Lumbar Two-Three Anterolateral lumbar interbody fusion with lateral plate     Active Problems:   Degenerative lumbar spinal stenosis   Discharged Condition: good  Hospital Course: Kathryn Romero was admitted for surgery with dx lumbar stenosis and neurogenic claudication. Following uncomplicated XLIF with lateral plate at D9-8, she recovered well. She has mobilized nicely with improved strength.   Consults: None  Significant Diagnostic Studies: radiology: X-Ray: intra-op  Treatments: surgery: Right Lumbar Two-Three Anterolateral lumbar interbody fusion with lateral plate (Right) - Right Lumbar Two-Three Anterolateral lumbar interbody fusion with lateral plate    Discharge Exam: Blood pressure (!) 146/68, pulse 64, temperature 97.7 F (36.5 C), temperature source Oral, resp. rate 17, height 5\' 1"  (1.549 m), weight 82.2 kg, SpO2 95 %. Alert, conversant. Reports minimal lumbar pain when mobilizing. Denies leg or buttock pain, numbness, tingling. Good strength BLE. She is pleased that she no longer feels leg weakness when walking. Incisions right side without erythema, swelling, or drainage beneath honeycomb and Dermabond drsgs.    Disposition: Discharge to SNF [facility]).  Office f/u in 3-4 weeks. Ok to remove drsgs at [facility]. Ok to shower. Tramadol 50mg  and Robaxin 500mg  for prn use.     Allergies as of 01/10/2018      Reactions   Codeine Shortness Of Breath   Hydromet [hydrocodone-homatropine] Shortness Of Breath, Other (See Comments)   Wheezing   Fish Allergy Other (See Comments)   Burning Sensation and Headache   Morphine Other (See Comments)   "feels funny"   Sulfa Antibiotics Nausea And Vomiting         Medication List    STOP taking these medications   ciprofloxacin 500 MG tablet Commonly known as:  CIPRO   meloxicam 15 MG tablet Commonly known as:  MOBIC     TAKE these medications   Alpha-Lipoic Acid 100 MG Caps Take 100 mg by mouth daily.   Collagen Hydrolysate Powd Take 1 Scoop by mouth daily.   HM VITAMIN B100 COMPLEX Tabs Take 1 tablet by mouth daily.   magnesium 30 MG tablet Take 30 mg by mouth daily.   methocarbamol 500 MG tablet Commonly known as:  ROBAXIN Take 1 tablet (500 mg total) by mouth every 6 (six) hours as needed for muscle spasms.   OVER THE COUNTER MEDICATION Take 1 tablet by mouth daily. Stem Cell Support   traMADol 50 MG tablet Commonly known as:  ULTRAM Take 1 tablet (50 mg total) by mouth every 6 (six) hours as needed for moderate pain.   Vitamin D3 3000 units Tabs Take 3,000 Units by mouth daily.      Contact information for after-discharge care    Destination    [facility]erred SNF .   Service:  Skilled Nursing Contact information: [place] [phone]              Signed: Peggyann Shoals, MD 01/10/2018, 9:54 AM

## 2018-01-10 NOTE — Clinical Social Work Placement (Signed)
   CLINICAL SOCIAL WORK PLACEMENT  NOTE [facility]  Date:  01/10/2018  Patient Details  Name: Kathryn Romero MRN: [id number] Date of Birth: Feb 08, 1936  Clinical Social Work is seeking post-discharge placement for this patient at the [place] level of care (*CSW will initial, date and re-position this form in  chart as items are completed):  Yes   Patient/family provided with [facility] Work Department's list of facilities offering this level of care within the geographic area requested by the patient (or if unable, by the patient's family).  Yes   Patient/family informed of their freedom to choose among providers that offer the needed level of care, that participate in Medicare, Medicaid or managed care program needed by the patient, have an available bed and are willing to accept the patient.  Yes   Patient/family informed of [facility]'s ownership interest in [facility] and [facility], as well as of the fact that they are under no obligation to receive care at these facilities.  PASRR submitted to EDS on       PASRR number received on       Existing PASRR number confirmed on 01/09/18     FL2 transmitted to all facilities in geographic area requested by pt/family on 01/09/18     FL2 transmitted to all facilities within larger geographic area on       Patient informed that his/her managed care company has contracts with or will negotiate with certain facilities, including the following:        Yes   Patient/family informed of bed offers received.  Patient chooses bed at [facility]     Physician recommends and patient chooses bed at      Patient to be transferred to [facility] on 01/10/18.  Patient to be transferred to facility by PTAR     Patient family notified on 01/10/18 of transfer.  Name of family member notified:  pt responsible for self     PHYSICIAN       Additional Comment:     _______________________________________________ [place], [place] 01/10/2018, 3:24 PM

## 2018-01-10 NOTE — Progress Notes (Signed)
Patient d/c via stretcher w/ PTAR to [place]. VS WNL see VS flow sheet.

## 2018-01-10 NOTE — Progress Notes (Signed)
Physical Therapy Treatment Patient Details Name: Kathryn Romero MRN: [id number] DOB: 01-06-1936 Today's Date: 01/10/2018    History of Present Illness Pt is a 82 y.o. female s/p L2-3 ALIF on 01/06/18. PMH includes legally blind, arthritis, anxiety, L3-5 PLIF (2017).    PT Comments    Patient progressing towards goals, but remains unsafe for home alone at this time.  Feel she will need STSNF level rehab prior to d/c home.  She is motivated to return to independence, but limited by pain, precautions, brace and blindness.  PT to follow acutely.   Follow Up Recommendations  SNF     Equipment Recommendations  None recommended by PT    Recommendations for Other Services       Precautions / Restrictions Precautions Precautions: Fall;Back Precaution Comments: Legally blind. Pt able to state all 3 back precautions Required Braces or Orthoses: Spinal Brace Spinal Brace: Lumbar corset;Applied in sitting position    Mobility  Bed Mobility Overal bed mobility: Needs Assistance Bed Mobility: Rolling;Sidelying to Sit Rolling: Supervision Sidelying to sit: Min guard       General bed mobility comments: cues for technique, assist for safety  Transfers Overall transfer level: Needs assistance Equipment used: Rolling walker (2 wheeled) Transfers: Sit to/from Stand Sit to Stand: Supervision         General transfer comment: verbal cues for hand placement  Ambulation/Gait Ambulation/Gait assistance: Min guard;Supervision Gait Distance (Feet): 130 Feet   Gait Pattern/deviations: Step-to pattern;Step-through pattern;Decreased stride length;Shuffle     General Gait Details: slow pace, shuffles feet despite cues and assist for direction and safety   Stairs             Wheelchair Mobility    Modified Rankin (Stroke Patients Only)       Balance Overall balance assessment: Needs assistance Sitting-balance support: No upper extremity supported;Feet  supported Sitting balance-Leahy Scale: Good     Standing balance support: No upper extremity supported;During functional activity Standing balance-Leahy Scale: Fair                              Cognition Arousal/Alertness: Awake/alert Behavior During Therapy: WFL for tasks assessed/performed Overall Cognitive Status: Within Functional Limits for tasks assessed                                        Exercises      General Comments        Pertinent Vitals/Pain Pain Assessment: No/denies pain    Home Living                      Prior Function            PT Goals (current goals can now be found in the care plan section) Progress towards PT goals: Progressing toward goals    Frequency    Min 5X/week      PT Plan Current plan remains appropriate    Co-evaluation              AM-PAC PT "6 Clicks" Daily Activity  Outcome Measure  Difficulty turning over in bed (including adjusting bedclothes, sheets and blankets)?: None Difficulty moving from lying on back to sitting on the side of the bed? : Unable Difficulty sitting down on and standing up from a chair with arms (e.g., wheelchair, bedside commode, etc,.)?:  Unable Help needed moving to and from a bed to chair (including a wheelchair)?: A Little Help needed walking in hospital room?: A Little Help needed climbing 3-5 steps with a railing? : A Little 6 Click Score: 15    End of Session Equipment Utilized During Treatment: Gait belt;Back brace Activity Tolerance: Patient tolerated treatment well Patient left: with call bell/phone within reach;in chair   PT Visit Diagnosis: Other abnormalities of gait and mobility (R26.89);Muscle weakness (generalized) (M62.81)     Time: 5041-3643 PT Time Calculation (min) (ACUTE ONLY): 17 min  Charges:  $Gait Training: 8-22 mins                     [place], [place] 01/10/2018    Reginia Naas 01/10/2018, 1:26 PM

## 2018-01-10 NOTE — Progress Notes (Addendum)
Subjective: Patient reports "I think I'm doing well. The Tramadol seems to help a lot"  Objective: Vital signs in last 24 hours: Temp:  [97.3 F (36.3 C)-99 F (37.2 C)] 97.7 F (36.5 C) (09/03 0617) Pulse Rate:  [64-68] 64 (09/03 0617) Resp:  [17] 17 (09/03 0617) BP: (144-146)/(68-70) 146/68 (09/03 0617) SpO2:  [94 %-95 %] 95 % (09/03 0617)  Intake/Output from previous day: 09/02 0701 - 09/03 0700 In: 680 [P.O.:680] Out: -  Intake/Output this shift: No intake/output data recorded.  Alert, conversant. Reports minimal lumbar pain when mobilizing. Denies leg or buttock pain, numbness, tingling. Good strength BLE. She is pleased that she no longer feels leg weakness when walking. Incisions right side without erythema, swelling, or drainage beneath honeycomb and Dermabond drsgs.   Lab Results: No results for input(s): WBC, HGB, HCT, PLT in the last 72 hours. BMET No results for input(s): NA, K, CL, CO2, GLUCOSE, BUN, CREATININE, CALCIUM in the last 72 hours.  Studies/Results: No results found.  Assessment/Plan: Improving  LOS: 4 days  Per Dr. Laurey Morale, D/C to SNF when bed available [facility]).  Office f/u in 3-4 weeks. Ok to remove drsgs at [facility]. Ok to shower. Tramadol 50mg  and Robaxin 500mg  for prn use.   Verdis Prime 01/10/2018, 7:29 AM    Patient doing well.  Transfer to SNF.

## 2018-01-10 NOTE — Progress Notes (Signed)
Report given to [place], Therapist, sports at [facility]. Awaiting PTAR for transport.

## 2018-01-11 DIAGNOSIS — K5903 Drug induced constipation: Secondary | ICD-10-CM | POA: Diagnosis not present

## 2018-01-11 DIAGNOSIS — M25551 Pain in right hip: Secondary | ICD-10-CM | POA: Diagnosis not present

## 2018-01-11 DIAGNOSIS — H3552 Pigmentary retinal dystrophy: Secondary | ICD-10-CM | POA: Diagnosis not present

## 2018-01-11 DIAGNOSIS — M4326 Fusion of spine, lumbar region: Secondary | ICD-10-CM | POA: Diagnosis not present

## 2018-01-11 DIAGNOSIS — K5900 Constipation, unspecified: Secondary | ICD-10-CM | POA: Diagnosis not present

## 2018-01-11 DIAGNOSIS — R110 Nausea: Secondary | ICD-10-CM | POA: Diagnosis not present

## 2018-01-11 DIAGNOSIS — R2689 Other abnormalities of gait and mobility: Secondary | ICD-10-CM | POA: Diagnosis not present

## 2018-01-11 MED FILL — Heparin Sodium (Porcine) Inj 1000 Unit/ML: INTRAMUSCULAR | Qty: 30 | Status: AC

## 2018-01-11 MED FILL — Sodium Chloride IV Soln 0.9%: INTRAVENOUS | Qty: 1000 | Status: AC

## 2018-01-13 DIAGNOSIS — M25551 Pain in right hip: Secondary | ICD-10-CM | POA: Diagnosis not present

## 2018-01-13 DIAGNOSIS — K5900 Constipation, unspecified: Secondary | ICD-10-CM | POA: Diagnosis not present

## 2018-01-13 DIAGNOSIS — R2689 Other abnormalities of gait and mobility: Secondary | ICD-10-CM | POA: Diagnosis not present

## 2018-01-17 DIAGNOSIS — H3552 Pigmentary retinal dystrophy: Secondary | ICD-10-CM | POA: Diagnosis not present

## 2018-01-17 DIAGNOSIS — R110 Nausea: Secondary | ICD-10-CM | POA: Diagnosis not present

## 2018-01-17 DIAGNOSIS — M4326 Fusion of spine, lumbar region: Secondary | ICD-10-CM | POA: Diagnosis not present

## 2018-01-17 DIAGNOSIS — K5903 Drug induced constipation: Secondary | ICD-10-CM | POA: Diagnosis not present

## 2018-01-19 DIAGNOSIS — Z4889 Encounter for other specified surgical aftercare: Secondary | ICD-10-CM | POA: Diagnosis not present

## 2018-01-23 DIAGNOSIS — M48062 Spinal stenosis, lumbar region with neurogenic claudication: Secondary | ICD-10-CM | POA: Diagnosis not present

## 2018-01-23 DIAGNOSIS — M5416 Radiculopathy, lumbar region: Secondary | ICD-10-CM | POA: Diagnosis not present

## 2018-01-23 DIAGNOSIS — H548 Legal blindness, as defined in USA: Secondary | ICD-10-CM | POA: Diagnosis not present

## 2018-01-23 DIAGNOSIS — Z4789 Encounter for other orthopedic aftercare: Secondary | ICD-10-CM | POA: Diagnosis not present

## 2018-01-23 DIAGNOSIS — M4316 Spondylolisthesis, lumbar region: Secondary | ICD-10-CM | POA: Diagnosis not present

## 2018-01-24 ENCOUNTER — Other Ambulatory Visit: Payer: Self-pay

## 2018-01-24 NOTE — Patient Outreach (Signed)
[facility]) Care Management  01/24/2018  Kathryn Romero 1935-09-09 [id number]     Transition of Care Referral  Referral Date: 01/24/18 Referral Source:HTA Discharge Report Date of Admission: unknown Diagnosis:"unsteadiness of feet" Date of Discharge: 01/18/18 Facility:[facility] Insurance: HTA    Outreach attempt # 1 to patient. Spoke with patient. She voices that she is doing well. She states that HHPT came out yesterday and will back on tomorrow. She voices therapy is going well.  Patient states that she [place] ambulating and using either walker or cane to help her get around. She states pain is controlled. She is not taking any pain meds at this time. She voices that she does not like to take any meds. She states that currently she is not taking anything. Prior to surgery she was only taking OTC vitamins/supplements which were placed on hold and she hopes to resume those soon. She has PCP appt on 01/26/18 and surgeon appt on 02/01/18. She has supportive family and friends available to help her out. She denies any RN CM or THN needs or concerns at this time. Patient was appreciative of follow up appt.      Plan: RN CM will close case at this time.     Enzo Montgomery, RN,BSN,CCM [place] Management Telephonic Care Management Coordinator Direct Phone: [phone] Toll Free: [phone] Fax: [phone]

## 2018-01-26 DIAGNOSIS — M4326 Fusion of spine, lumbar region: Secondary | ICD-10-CM | POA: Diagnosis not present

## 2018-01-26 DIAGNOSIS — M5416 Radiculopathy, lumbar region: Secondary | ICD-10-CM | POA: Diagnosis not present

## 2018-01-26 DIAGNOSIS — K219 Gastro-esophageal reflux disease without esophagitis: Secondary | ICD-10-CM | POA: Diagnosis not present

## 2018-01-26 DIAGNOSIS — I10 Essential (primary) hypertension: Secondary | ICD-10-CM | POA: Diagnosis not present

## 2018-01-26 DIAGNOSIS — M48061 Spinal stenosis, lumbar region without neurogenic claudication: Secondary | ICD-10-CM | POA: Diagnosis not present

## 2018-01-26 DIAGNOSIS — Z6833 Body mass index (BMI) 33.0-33.9, adult: Secondary | ICD-10-CM | POA: Diagnosis not present

## 2018-01-30 DIAGNOSIS — Z4789 Encounter for other orthopedic aftercare: Secondary | ICD-10-CM | POA: Diagnosis not present

## 2018-01-30 DIAGNOSIS — M48062 Spinal stenosis, lumbar region with neurogenic claudication: Secondary | ICD-10-CM | POA: Diagnosis not present

## 2018-01-30 DIAGNOSIS — M4316 Spondylolisthesis, lumbar region: Secondary | ICD-10-CM | POA: Diagnosis not present

## 2018-01-30 DIAGNOSIS — H548 Legal blindness, as defined in USA: Secondary | ICD-10-CM | POA: Diagnosis not present

## 2018-01-30 DIAGNOSIS — M5416 Radiculopathy, lumbar region: Secondary | ICD-10-CM | POA: Diagnosis not present

## 2018-02-01 DIAGNOSIS — M4316 Spondylolisthesis, lumbar region: Secondary | ICD-10-CM | POA: Diagnosis not present

## 2018-02-07 DIAGNOSIS — M5416 Radiculopathy, lumbar region: Secondary | ICD-10-CM | POA: Diagnosis not present

## 2018-02-07 DIAGNOSIS — Z4789 Encounter for other orthopedic aftercare: Secondary | ICD-10-CM | POA: Diagnosis not present

## 2018-02-07 DIAGNOSIS — M4316 Spondylolisthesis, lumbar region: Secondary | ICD-10-CM | POA: Diagnosis not present

## 2018-02-07 DIAGNOSIS — H548 Legal blindness, as defined in USA: Secondary | ICD-10-CM | POA: Diagnosis not present

## 2018-02-07 DIAGNOSIS — M48062 Spinal stenosis, lumbar region with neurogenic claudication: Secondary | ICD-10-CM | POA: Diagnosis not present

## 2018-03-08 DIAGNOSIS — M545 Low back pain: Secondary | ICD-10-CM | POA: Diagnosis not present

## 2018-03-12 IMAGING — DX DG CHEST 2V
2 series · 2 of 2 positions shown · non-contrast
Comparison: None.

CLINICAL DATA: Chest pain, weakness

EXAM:
CHEST  2 VIEW

[w chest pa]
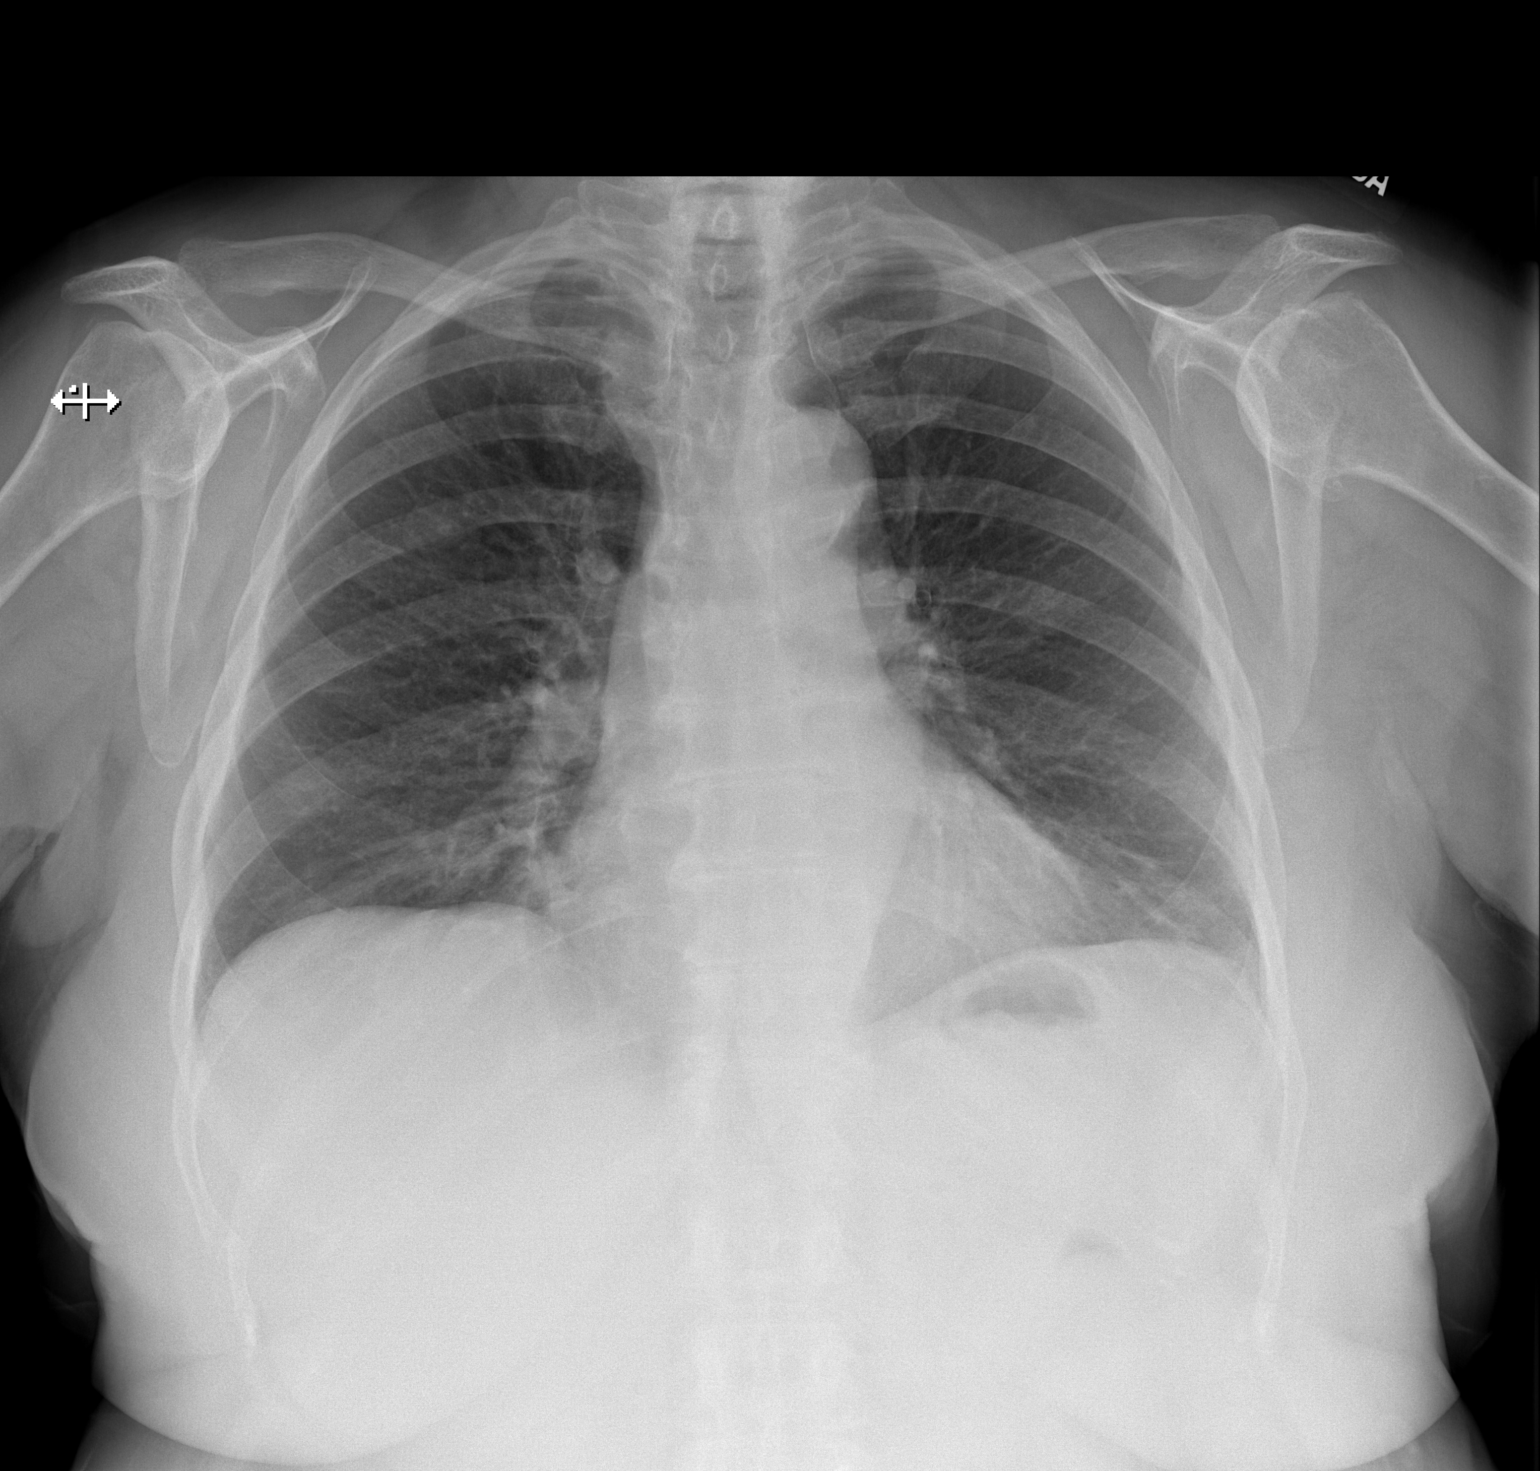

[w chest lat]
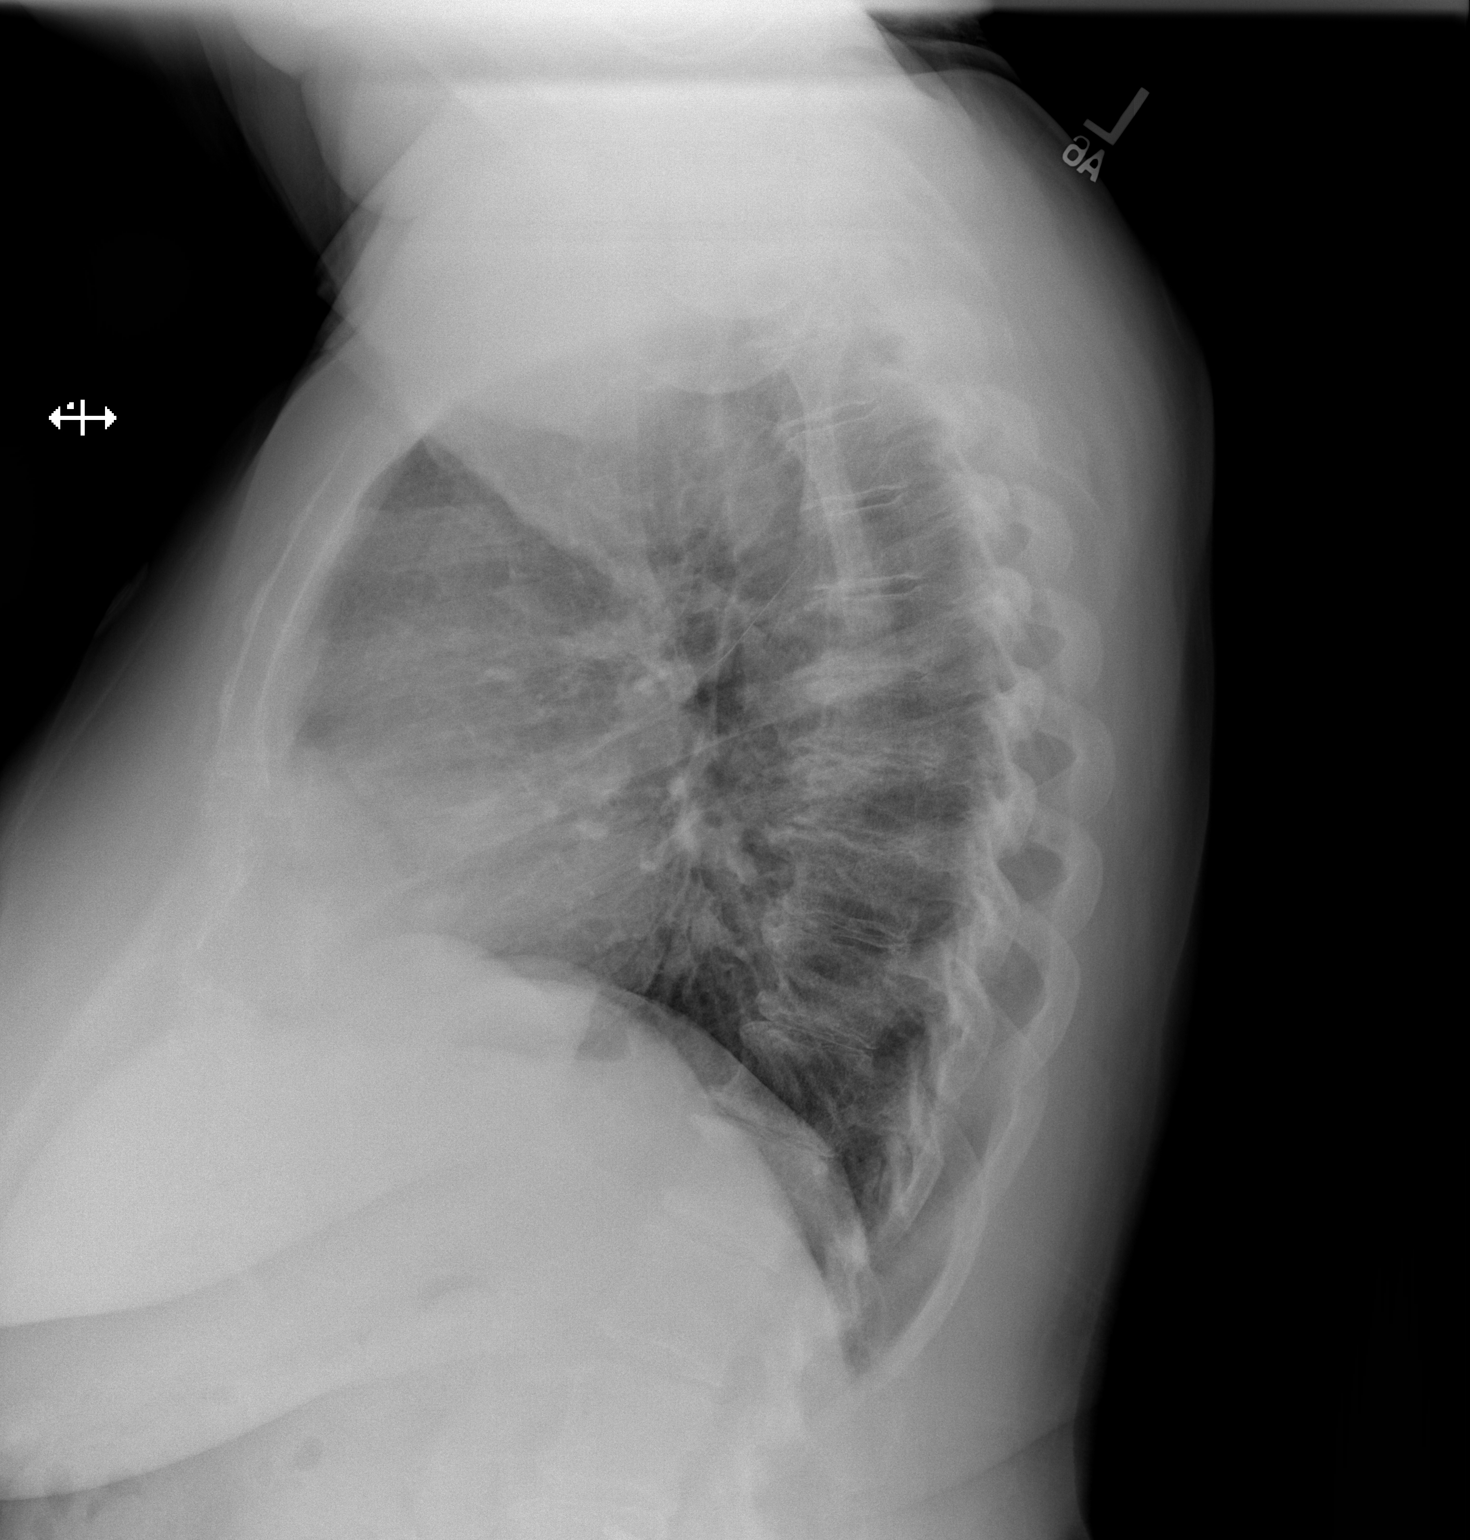

[2 of 2 positions shown; findings below may reference images not displayed]

FINDINGS: The heart size and mediastinal contours are within normal limits.
Both lungs are clear. The visualized skeletal structures are
unremarkable.
IMPRESSION: No active cardiopulmonary disease.

## 2018-04-04 DIAGNOSIS — I10 Essential (primary) hypertension: Secondary | ICD-10-CM | POA: Diagnosis not present

## 2018-04-04 DIAGNOSIS — H9202 Otalgia, left ear: Secondary | ICD-10-CM | POA: Diagnosis not present

## 2018-04-04 DIAGNOSIS — J069 Acute upper respiratory infection, unspecified: Secondary | ICD-10-CM | POA: Diagnosis not present

## 2018-04-04 DIAGNOSIS — Z6834 Body mass index (BMI) 34.0-34.9, adult: Secondary | ICD-10-CM | POA: Diagnosis not present

## 2018-04-24 DIAGNOSIS — Z006 Encounter for examination for normal comparison and control in clinical research program: Secondary | ICD-10-CM | POA: Diagnosis not present

## 2018-05-26 ENCOUNTER — Ambulatory Visit: Payer: PPO | Admitting: Internal Medicine

## 2018-06-14 DIAGNOSIS — M48062 Spinal stenosis, lumbar region with neurogenic claudication: Secondary | ICD-10-CM | POA: Diagnosis not present

## 2018-06-14 DIAGNOSIS — M4316 Spondylolisthesis, lumbar region: Secondary | ICD-10-CM | POA: Diagnosis not present

## 2018-06-14 DIAGNOSIS — M545 Low back pain: Secondary | ICD-10-CM | POA: Diagnosis not present

## 2018-06-14 DIAGNOSIS — M5416 Radiculopathy, lumbar region: Secondary | ICD-10-CM | POA: Diagnosis not present

## 2018-07-17 DIAGNOSIS — D6489 Other specified anemias: Secondary | ICD-10-CM | POA: Diagnosis not present

## 2018-07-17 DIAGNOSIS — J069 Acute upper respiratory infection, unspecified: Secondary | ICD-10-CM | POA: Diagnosis not present

## 2018-07-17 DIAGNOSIS — J309 Allergic rhinitis, unspecified: Secondary | ICD-10-CM | POA: Diagnosis not present

## 2018-07-24 IMAGING — MR MR LUMBAR SPINE WO/W CM
4 of 8 series · 23 of 48 positions shown · IV contrast (18ml Multihance)
Comparison: Plain films 07/12/2016.  MRI lumbar spine 04/22/2016.

CLINICAL DATA: Previous surgery November 2015.  Unsteady.  Imbalance.

EXAM:
MRI LUMBAR SPINE WITHOUT AND WITH CONTRAST
TECHNIQUE: Multiplanar and multiecho pulse sequences of the lumbar spine were
obtained without and with intravenous contrast.
CONTRAST:  20mL MULTIHANCE GADOBENATE DIMEGLUMINE 529 MG/ML IV SOLN
Creatinine was obtained on site at [HOSPITAL] at [HOSPITAL].
Results: Creatinine 0.7 mg/dL.

[Series 4: T1 · sagittal · 4.0mm · 0.47mm/px · 3 of 12 slices shown (1 of 2)]
[im 1/12]
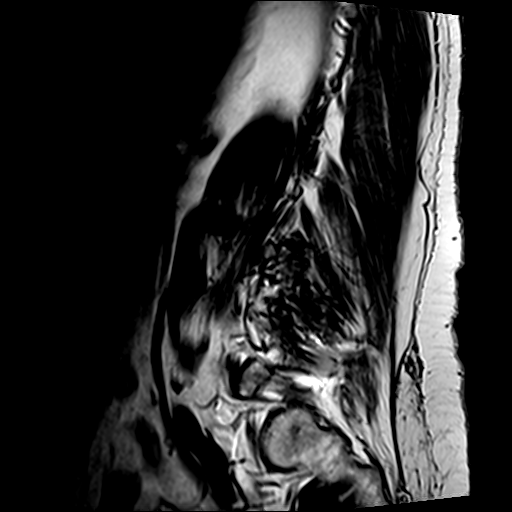
[im 6/12]
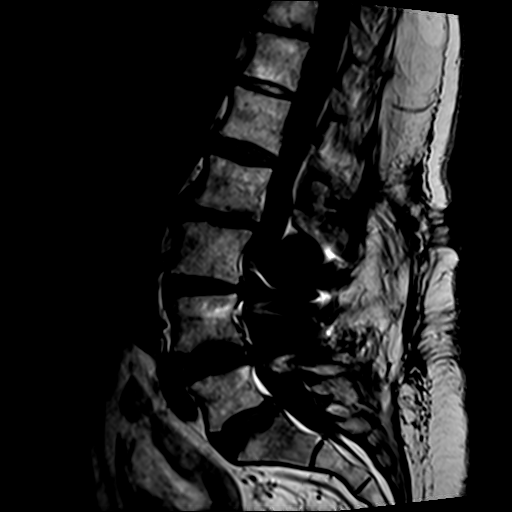
[im 12/12]
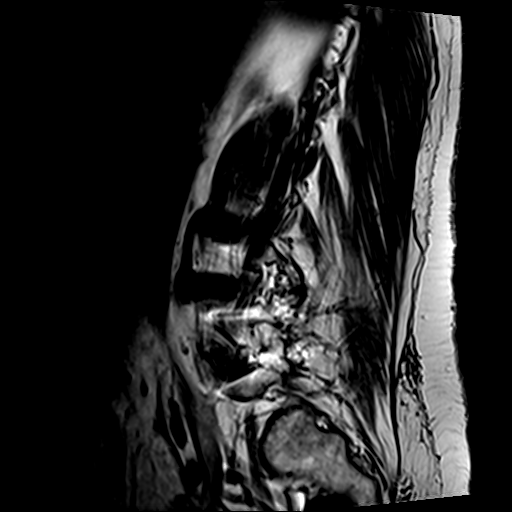

[Series 5: T1 · axial · 4.0mm · 0.37mm/px · z∈[-51,+180]mm · 8 of 32 slices shown (2 of 2)]
[im 1/32]
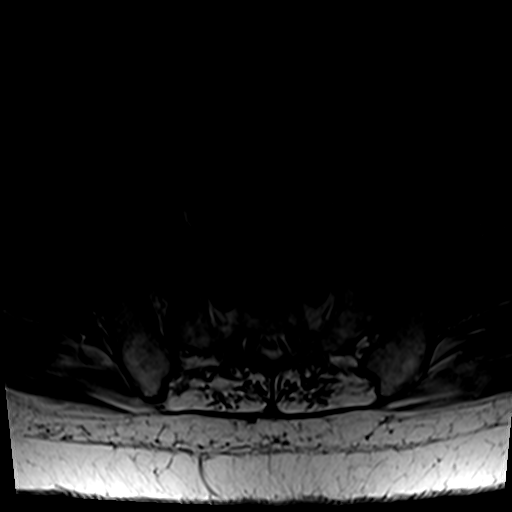
[im 4/32]
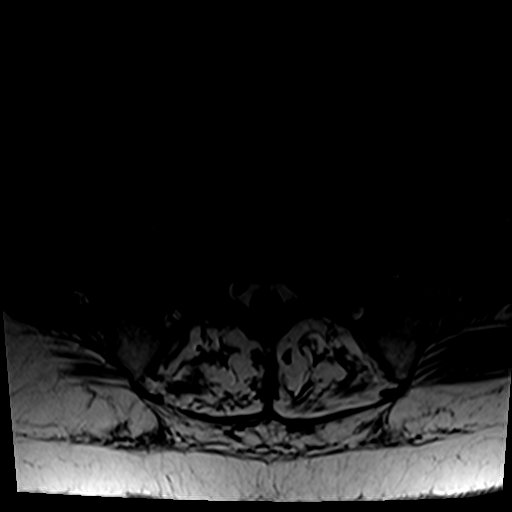
[im 11/32]
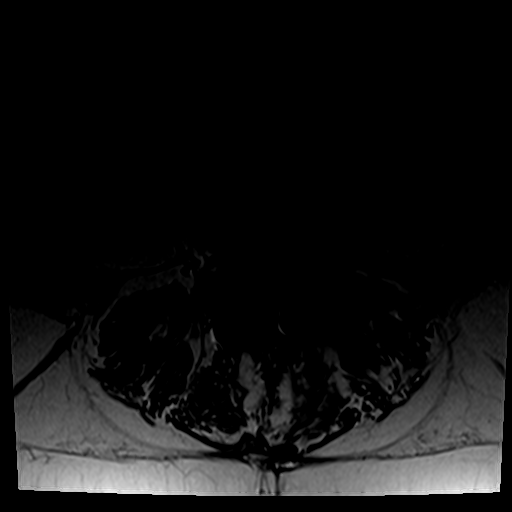
[im 14/32]
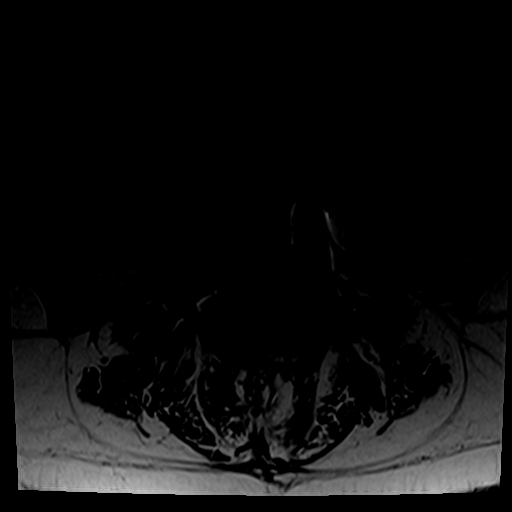
[im 18/32]
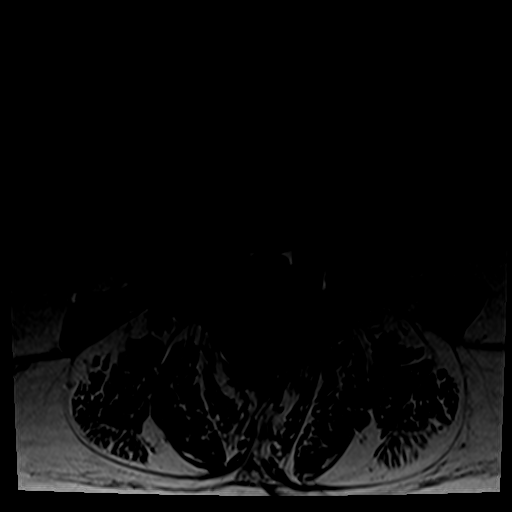
[im 21/32]
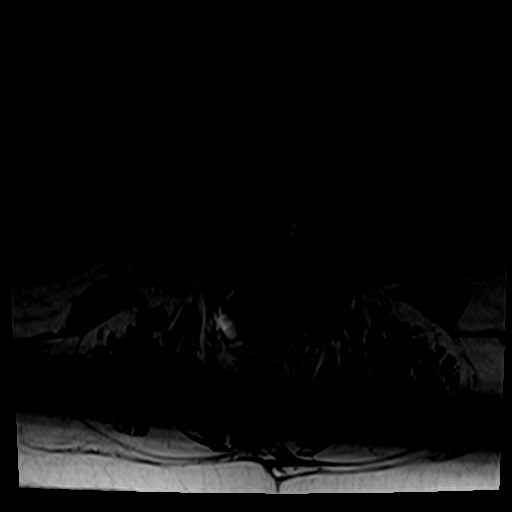
[im 28/32]
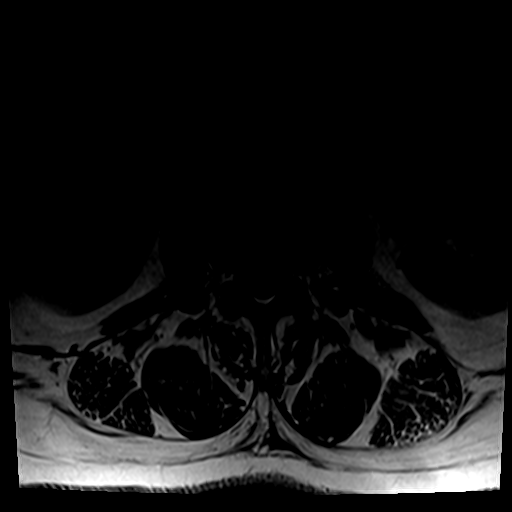
[im 32/32]
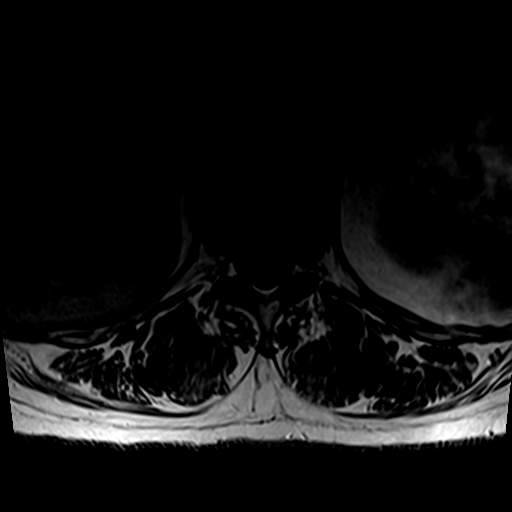

[Series 6: T2 · axial · 4.0mm · 0.74mm/px · z∈[-51,+180]mm · 8 of 32 slices shown (1 of 2)]
[im 1/32]
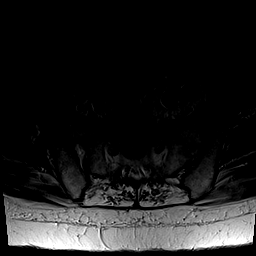
[im 4/32]
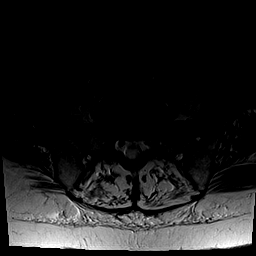
[im 11/32]
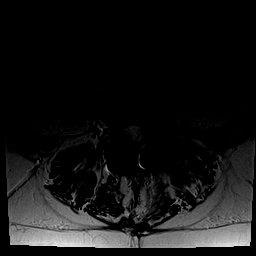
[im 14/32]
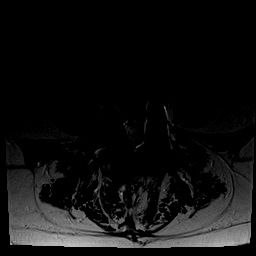
[im 18/32]
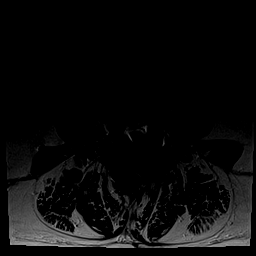
[im 21/32]
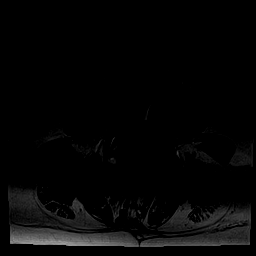
[im 28/32]
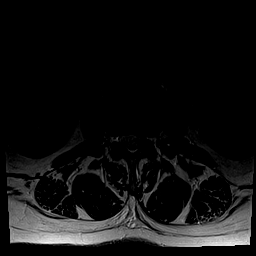
[im 32/32]
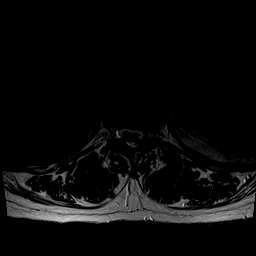

[Series 7: T2 · sagittal · 4.0mm · 0.47mm/px · 4 of 12 slices shown (2 of 2)]
[im 1/12]
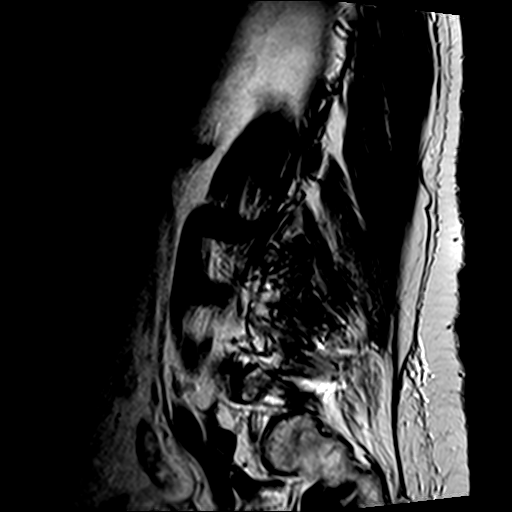
[im 4/12]
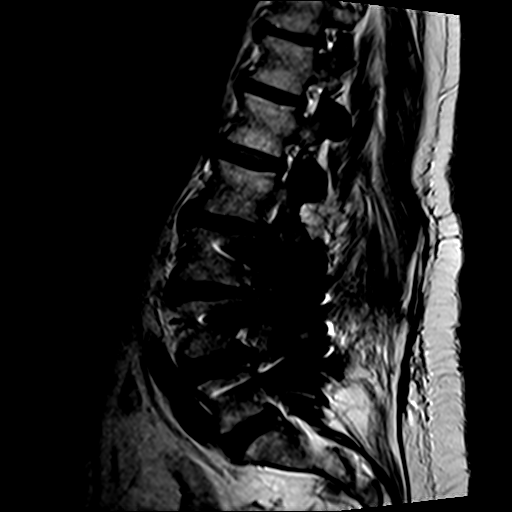
[im 8/12]
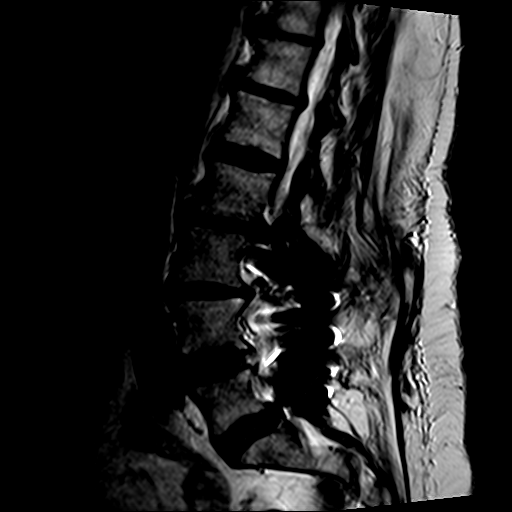
[im 12/12]
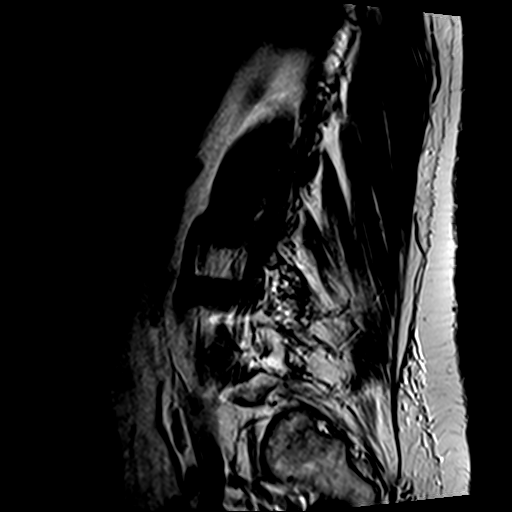

[23 of 48 positions shown; findings below may reference images not displayed]

FINDINGS: Segmentation:  Standard

Alignment:  At L2-3 of 2-3 mm.  Anterolisthesis at L5-S1 of 2-3 mm.

Vertebrae: No worrisome osseous lesion. I am unable confirm were
excluded interbody arthrodesis or posterior arthrodesis at L4-5. No
interbody cage placed at L3-L4. Disc space narrowing results in
reactive endplate changes above and below L2-L3.

Conus medullaris: Extends to the L1 level and appears normal.

Paraspinal and other soft tissues: Paravertebral muscle atrophy. No
abnormal fluid collection.

Disc levels:

L1-L2: The disc space is unremarkable. There is moderately advanced
facet arthropathy, worse on the LEFT. Subarticular zone narrowing
affects the LEFT L2 nerve root.

L2-L3: Moderate to severe adjacent segment disease. Disc space
narrowing, 2-3 mm retrolisthesis, central protrusion, and posterior
element hypertrophy. Severe stenosis. BILATERAL L2 and L3 nerve root
impingement.

L3-L4: Central protrusion. Susceptibility artifact limits
assessment, but BILATERAL L4 nerve root impingement is possible.
Difficult to assess for foraminal narrowing, although RIGHT greater
than LEFT L3 nerve root impingement could be present.

L4-L5:  Postsurgical changes.  No definite residual impingement.

L5-S1: 3 mm anterolisthesis. Annular bulge. Posterior element
hypertrophy. No spinal stenosis or subarticular zone narrowing.
BILATERAL foraminal narrowing affecting the L5 nerve roots is
possible.

Compared with the most recent prior study from 2811, the stenosis at
L2-3 appears worse.
IMPRESSION: Progressive (since 2811) adjacent segment disease at L2-3, with disc
space narrowing, 2-3 mm retrolisthesis, central protrusion, and
posterior element hypertrophy. Severe stenosis with BILATERAL L2 and
L3 nerve root impingement is observed.

Postsurgical changes L3 through L5 as described. Central protrusion
at L3-4 is observed, and definite neural impingement this level is
difficult to exclude due to the artifact from pedicle screws.

3 mm anterolisthesis L5-S1, inferior to the fusion construct, with
annular bulging and posterior element hypertrophy. No stenosis, but
correlate clinically for BILATERAL L5 nerve root impingement in the
foramina.

## 2018-09-27 DIAGNOSIS — I10 Essential (primary) hypertension: Secondary | ICD-10-CM | POA: Diagnosis not present

## 2018-09-27 DIAGNOSIS — Z9181 History of falling: Secondary | ICD-10-CM | POA: Diagnosis not present

## 2018-09-27 DIAGNOSIS — M4316 Spondylolisthesis, lumbar region: Secondary | ICD-10-CM | POA: Diagnosis not present

## 2018-09-27 DIAGNOSIS — M25552 Pain in left hip: Secondary | ICD-10-CM | POA: Diagnosis not present

## 2018-09-27 DIAGNOSIS — H548 Legal blindness, as defined in USA: Secondary | ICD-10-CM | POA: Diagnosis not present

## 2018-09-27 DIAGNOSIS — M5416 Radiculopathy, lumbar region: Secondary | ICD-10-CM | POA: Diagnosis not present

## 2018-09-27 DIAGNOSIS — M48062 Spinal stenosis, lumbar region with neurogenic claudication: Secondary | ICD-10-CM | POA: Diagnosis not present

## 2018-09-27 DIAGNOSIS — M7022 Olecranon bursitis, left elbow: Secondary | ICD-10-CM | POA: Diagnosis not present

## 2018-10-10 DIAGNOSIS — Z9181 History of falling: Secondary | ICD-10-CM | POA: Diagnosis not present

## 2018-10-10 DIAGNOSIS — H548 Legal blindness, as defined in USA: Secondary | ICD-10-CM | POA: Diagnosis not present

## 2018-10-10 DIAGNOSIS — M4316 Spondylolisthesis, lumbar region: Secondary | ICD-10-CM | POA: Diagnosis not present

## 2018-10-10 DIAGNOSIS — M48062 Spinal stenosis, lumbar region with neurogenic claudication: Secondary | ICD-10-CM | POA: Diagnosis not present

## 2018-10-10 DIAGNOSIS — M5416 Radiculopathy, lumbar region: Secondary | ICD-10-CM | POA: Diagnosis not present

## 2018-10-10 DIAGNOSIS — M25552 Pain in left hip: Secondary | ICD-10-CM | POA: Diagnosis not present

## 2018-10-10 DIAGNOSIS — M7022 Olecranon bursitis, left elbow: Secondary | ICD-10-CM | POA: Diagnosis not present

## 2018-10-10 DIAGNOSIS — I10 Essential (primary) hypertension: Secondary | ICD-10-CM | POA: Diagnosis not present

## 2018-10-27 DIAGNOSIS — H548 Legal blindness, as defined in USA: Secondary | ICD-10-CM | POA: Diagnosis not present

## 2018-10-27 DIAGNOSIS — M48062 Spinal stenosis, lumbar region with neurogenic claudication: Secondary | ICD-10-CM | POA: Diagnosis not present

## 2018-10-27 DIAGNOSIS — M4316 Spondylolisthesis, lumbar region: Secondary | ICD-10-CM | POA: Diagnosis not present

## 2018-10-27 DIAGNOSIS — I10 Essential (primary) hypertension: Secondary | ICD-10-CM | POA: Diagnosis not present

## 2018-10-27 DIAGNOSIS — M25552 Pain in left hip: Secondary | ICD-10-CM | POA: Diagnosis not present

## 2018-10-27 DIAGNOSIS — M7022 Olecranon bursitis, left elbow: Secondary | ICD-10-CM | POA: Diagnosis not present

## 2018-10-27 DIAGNOSIS — Z9181 History of falling: Secondary | ICD-10-CM | POA: Diagnosis not present

## 2018-10-27 DIAGNOSIS — M5416 Radiculopathy, lumbar region: Secondary | ICD-10-CM | POA: Diagnosis not present

## 2018-11-08 ENCOUNTER — Encounter: Payer: Self-pay | Admitting: Internal Medicine

## 2018-11-09 DIAGNOSIS — M7022 Olecranon bursitis, left elbow: Secondary | ICD-10-CM | POA: Diagnosis not present

## 2018-11-09 DIAGNOSIS — H548 Legal blindness, as defined in USA: Secondary | ICD-10-CM | POA: Diagnosis not present

## 2018-11-09 DIAGNOSIS — M4316 Spondylolisthesis, lumbar region: Secondary | ICD-10-CM | POA: Diagnosis not present

## 2018-11-09 DIAGNOSIS — M5416 Radiculopathy, lumbar region: Secondary | ICD-10-CM | POA: Diagnosis not present

## 2018-11-09 DIAGNOSIS — M48062 Spinal stenosis, lumbar region with neurogenic claudication: Secondary | ICD-10-CM | POA: Diagnosis not present

## 2018-11-09 DIAGNOSIS — I10 Essential (primary) hypertension: Secondary | ICD-10-CM | POA: Diagnosis not present

## 2018-11-09 DIAGNOSIS — M25552 Pain in left hip: Secondary | ICD-10-CM | POA: Diagnosis not present

## 2018-11-09 DIAGNOSIS — Z9181 History of falling: Secondary | ICD-10-CM | POA: Diagnosis not present

## 2018-11-21 DIAGNOSIS — R229 Localized swelling, mass and lump, unspecified: Secondary | ICD-10-CM | POA: Diagnosis not present

## 2018-11-29 ENCOUNTER — Encounter: Payer: PPO | Admitting: Internal Medicine

## 2018-12-05 DIAGNOSIS — R5383 Other fatigue: Secondary | ICD-10-CM | POA: Diagnosis not present

## 2018-12-05 DIAGNOSIS — D649 Anemia, unspecified: Secondary | ICD-10-CM | POA: Diagnosis not present

## 2018-12-05 DIAGNOSIS — I10 Essential (primary) hypertension: Secondary | ICD-10-CM | POA: Diagnosis not present

## 2018-12-05 DIAGNOSIS — M6281 Muscle weakness (generalized): Secondary | ICD-10-CM | POA: Diagnosis not present

## 2019-01-24 DIAGNOSIS — E7849 Other hyperlipidemia: Secondary | ICD-10-CM | POA: Diagnosis not present

## 2019-01-25 DIAGNOSIS — H10413 Chronic giant papillary conjunctivitis, bilateral: Secondary | ICD-10-CM | POA: Diagnosis not present

## 2019-01-25 DIAGNOSIS — H04123 Dry eye syndrome of bilateral lacrimal glands: Secondary | ICD-10-CM | POA: Diagnosis not present

## 2019-01-25 DIAGNOSIS — H3552 Pigmentary retinal dystrophy: Secondary | ICD-10-CM | POA: Diagnosis not present

## 2019-01-31 DIAGNOSIS — E785 Hyperlipidemia, unspecified: Secondary | ICD-10-CM | POA: Diagnosis not present

## 2019-01-31 DIAGNOSIS — H8110 Benign paroxysmal vertigo, unspecified ear: Secondary | ICD-10-CM | POA: Diagnosis not present

## 2019-01-31 DIAGNOSIS — Z1331 Encounter for screening for depression: Secondary | ICD-10-CM | POA: Diagnosis not present

## 2019-01-31 DIAGNOSIS — K219 Gastro-esophageal reflux disease without esophagitis: Secondary | ICD-10-CM | POA: Diagnosis not present

## 2019-01-31 DIAGNOSIS — I10 Essential (primary) hypertension: Secondary | ICD-10-CM | POA: Diagnosis not present

## 2019-01-31 DIAGNOSIS — F419 Anxiety disorder, unspecified: Secondary | ICD-10-CM | POA: Diagnosis not present

## 2019-01-31 DIAGNOSIS — Z0000 Encounter for general adult medical examination without abnormal findings: Secondary | ICD-10-CM | POA: Diagnosis not present

## 2019-01-31 DIAGNOSIS — E7439 Other disorders of intestinal carbohydrate absorption: Secondary | ICD-10-CM | POA: Diagnosis not present

## 2019-01-31 DIAGNOSIS — M4326 Fusion of spine, lumbar region: Secondary | ICD-10-CM | POA: Diagnosis not present

## 2019-01-31 DIAGNOSIS — M48061 Spinal stenosis, lumbar region without neurogenic claudication: Secondary | ICD-10-CM | POA: Diagnosis not present

## 2019-01-31 DIAGNOSIS — Z85038 Personal history of other malignant neoplasm of large intestine: Secondary | ICD-10-CM | POA: Diagnosis not present

## 2019-02-05 DIAGNOSIS — R82998 Other abnormal findings in urine: Secondary | ICD-10-CM | POA: Diagnosis not present

## 2019-02-05 DIAGNOSIS — I10 Essential (primary) hypertension: Secondary | ICD-10-CM | POA: Diagnosis not present

## 2019-02-07 DIAGNOSIS — R7301 Impaired fasting glucose: Secondary | ICD-10-CM | POA: Diagnosis not present

## 2019-02-07 DIAGNOSIS — E673 Hypervitaminosis D: Secondary | ICD-10-CM | POA: Diagnosis not present

## 2019-02-07 DIAGNOSIS — E782 Mixed hyperlipidemia: Secondary | ICD-10-CM | POA: Diagnosis not present

## 2019-02-07 DIAGNOSIS — N951 Menopausal and female climacteric states: Secondary | ICD-10-CM | POA: Diagnosis not present

## 2019-02-07 DIAGNOSIS — R635 Abnormal weight gain: Secondary | ICD-10-CM | POA: Diagnosis not present

## 2019-02-09 DIAGNOSIS — N951 Menopausal and female climacteric states: Secondary | ICD-10-CM | POA: Diagnosis not present

## 2019-02-09 DIAGNOSIS — Z1339 Encounter for screening examination for other mental health and behavioral disorders: Secondary | ICD-10-CM | POA: Diagnosis not present

## 2019-02-09 DIAGNOSIS — E781 Pure hyperglyceridemia: Secondary | ICD-10-CM | POA: Diagnosis not present

## 2019-02-09 DIAGNOSIS — E673 Hypervitaminosis D: Secondary | ICD-10-CM | POA: Diagnosis not present

## 2019-02-09 DIAGNOSIS — Z6835 Body mass index (BMI) 35.0-35.9, adult: Secondary | ICD-10-CM | POA: Diagnosis not present

## 2019-02-09 DIAGNOSIS — Z1331 Encounter for screening for depression: Secondary | ICD-10-CM | POA: Diagnosis not present

## 2019-02-09 DIAGNOSIS — E782 Mixed hyperlipidemia: Secondary | ICD-10-CM | POA: Diagnosis not present

## 2019-02-09 DIAGNOSIS — R7301 Impaired fasting glucose: Secondary | ICD-10-CM | POA: Diagnosis not present

## 2019-02-14 DIAGNOSIS — Z6834 Body mass index (BMI) 34.0-34.9, adult: Secondary | ICD-10-CM | POA: Diagnosis not present

## 2019-02-14 DIAGNOSIS — E782 Mixed hyperlipidemia: Secondary | ICD-10-CM | POA: Diagnosis not present

## 2019-02-20 DIAGNOSIS — Z6835 Body mass index (BMI) 35.0-35.9, adult: Secondary | ICD-10-CM | POA: Diagnosis not present

## 2019-02-20 DIAGNOSIS — R7301 Impaired fasting glucose: Secondary | ICD-10-CM | POA: Diagnosis not present

## 2019-03-12 DIAGNOSIS — M5416 Radiculopathy, lumbar region: Secondary | ICD-10-CM | POA: Diagnosis not present

## 2019-03-12 DIAGNOSIS — E782 Mixed hyperlipidemia: Secondary | ICD-10-CM | POA: Diagnosis not present

## 2019-03-12 DIAGNOSIS — M4316 Spondylolisthesis, lumbar region: Secondary | ICD-10-CM | POA: Diagnosis not present

## 2019-03-12 DIAGNOSIS — M48062 Spinal stenosis, lumbar region with neurogenic claudication: Secondary | ICD-10-CM | POA: Diagnosis not present

## 2019-03-12 DIAGNOSIS — R7301 Impaired fasting glucose: Secondary | ICD-10-CM | POA: Diagnosis not present

## 2019-03-12 DIAGNOSIS — M545 Low back pain: Secondary | ICD-10-CM | POA: Diagnosis not present

## 2019-03-12 DIAGNOSIS — Z6834 Body mass index (BMI) 34.0-34.9, adult: Secondary | ICD-10-CM | POA: Diagnosis not present

## 2019-03-21 DIAGNOSIS — M5416 Radiculopathy, lumbar region: Secondary | ICD-10-CM | POA: Diagnosis not present

## 2019-03-21 DIAGNOSIS — M5136 Other intervertebral disc degeneration, lumbar region: Secondary | ICD-10-CM | POA: Diagnosis not present

## 2019-03-21 DIAGNOSIS — M545 Low back pain: Secondary | ICD-10-CM | POA: Diagnosis not present

## 2019-03-30 DIAGNOSIS — I10 Essential (primary) hypertension: Secondary | ICD-10-CM | POA: Diagnosis not present

## 2019-03-30 DIAGNOSIS — Z9181 History of falling: Secondary | ICD-10-CM | POA: Diagnosis not present

## 2019-03-30 DIAGNOSIS — M7022 Olecranon bursitis, left elbow: Secondary | ICD-10-CM | POA: Diagnosis not present

## 2019-03-30 DIAGNOSIS — M25552 Pain in left hip: Secondary | ICD-10-CM | POA: Diagnosis not present

## 2019-03-30 DIAGNOSIS — M4316 Spondylolisthesis, lumbar region: Secondary | ICD-10-CM | POA: Diagnosis not present

## 2019-03-30 DIAGNOSIS — H548 Legal blindness, as defined in USA: Secondary | ICD-10-CM | POA: Diagnosis not present

## 2019-03-30 DIAGNOSIS — M48062 Spinal stenosis, lumbar region with neurogenic claudication: Secondary | ICD-10-CM | POA: Diagnosis not present

## 2019-03-30 DIAGNOSIS — M5116 Intervertebral disc disorders with radiculopathy, lumbar region: Secondary | ICD-10-CM | POA: Diagnosis not present

## 2019-04-09 DIAGNOSIS — M4316 Spondylolisthesis, lumbar region: Secondary | ICD-10-CM | POA: Diagnosis not present

## 2019-04-09 DIAGNOSIS — M48062 Spinal stenosis, lumbar region with neurogenic claudication: Secondary | ICD-10-CM | POA: Diagnosis not present

## 2019-04-09 DIAGNOSIS — M545 Low back pain: Secondary | ICD-10-CM | POA: Diagnosis not present

## 2019-04-09 DIAGNOSIS — M5416 Radiculopathy, lumbar region: Secondary | ICD-10-CM | POA: Diagnosis not present

## 2019-04-12 DIAGNOSIS — M5116 Intervertebral disc disorders with radiculopathy, lumbar region: Secondary | ICD-10-CM | POA: Diagnosis not present

## 2019-04-12 DIAGNOSIS — M7022 Olecranon bursitis, left elbow: Secondary | ICD-10-CM | POA: Diagnosis not present

## 2019-04-12 DIAGNOSIS — I10 Essential (primary) hypertension: Secondary | ICD-10-CM | POA: Diagnosis not present

## 2019-04-12 DIAGNOSIS — H548 Legal blindness, as defined in USA: Secondary | ICD-10-CM | POA: Diagnosis not present

## 2019-04-12 DIAGNOSIS — M4316 Spondylolisthesis, lumbar region: Secondary | ICD-10-CM | POA: Diagnosis not present

## 2019-04-12 DIAGNOSIS — M25552 Pain in left hip: Secondary | ICD-10-CM | POA: Diagnosis not present

## 2019-04-12 DIAGNOSIS — Z9181 History of falling: Secondary | ICD-10-CM | POA: Diagnosis not present

## 2019-04-12 DIAGNOSIS — M48062 Spinal stenosis, lumbar region with neurogenic claudication: Secondary | ICD-10-CM | POA: Diagnosis not present

## 2019-04-26 DIAGNOSIS — M7022 Olecranon bursitis, left elbow: Secondary | ICD-10-CM | POA: Diagnosis not present

## 2019-04-26 DIAGNOSIS — Z9181 History of falling: Secondary | ICD-10-CM | POA: Diagnosis not present

## 2019-04-26 DIAGNOSIS — M4316 Spondylolisthesis, lumbar region: Secondary | ICD-10-CM | POA: Diagnosis not present

## 2019-04-26 DIAGNOSIS — M5116 Intervertebral disc disorders with radiculopathy, lumbar region: Secondary | ICD-10-CM | POA: Diagnosis not present

## 2019-04-26 DIAGNOSIS — M25552 Pain in left hip: Secondary | ICD-10-CM | POA: Diagnosis not present

## 2019-04-26 DIAGNOSIS — M48062 Spinal stenosis, lumbar region with neurogenic claudication: Secondary | ICD-10-CM | POA: Diagnosis not present

## 2019-04-26 DIAGNOSIS — I10 Essential (primary) hypertension: Secondary | ICD-10-CM | POA: Diagnosis not present

## 2019-04-26 DIAGNOSIS — H548 Legal blindness, as defined in USA: Secondary | ICD-10-CM | POA: Diagnosis not present

## 2019-05-15 DIAGNOSIS — M5116 Intervertebral disc disorders with radiculopathy, lumbar region: Secondary | ICD-10-CM | POA: Diagnosis not present

## 2019-05-15 DIAGNOSIS — I10 Essential (primary) hypertension: Secondary | ICD-10-CM | POA: Diagnosis not present

## 2019-05-15 DIAGNOSIS — M7022 Olecranon bursitis, left elbow: Secondary | ICD-10-CM | POA: Diagnosis not present

## 2019-05-15 DIAGNOSIS — M4316 Spondylolisthesis, lumbar region: Secondary | ICD-10-CM | POA: Diagnosis not present

## 2019-05-15 DIAGNOSIS — M25552 Pain in left hip: Secondary | ICD-10-CM | POA: Diagnosis not present

## 2019-05-15 DIAGNOSIS — H548 Legal blindness, as defined in USA: Secondary | ICD-10-CM | POA: Diagnosis not present

## 2019-05-15 DIAGNOSIS — M48062 Spinal stenosis, lumbar region with neurogenic claudication: Secondary | ICD-10-CM | POA: Diagnosis not present

## 2019-05-15 DIAGNOSIS — Z9181 History of falling: Secondary | ICD-10-CM | POA: Diagnosis not present

## 2019-06-11 DIAGNOSIS — M545 Low back pain: Secondary | ICD-10-CM | POA: Diagnosis not present

## 2019-06-11 DIAGNOSIS — M5416 Radiculopathy, lumbar region: Secondary | ICD-10-CM | POA: Diagnosis not present

## 2019-07-16 DIAGNOSIS — M4316 Spondylolisthesis, lumbar region: Secondary | ICD-10-CM | POA: Diagnosis not present

## 2019-07-16 DIAGNOSIS — R030 Elevated blood-pressure reading, without diagnosis of hypertension: Secondary | ICD-10-CM | POA: Diagnosis not present

## 2019-07-16 DIAGNOSIS — M5412 Radiculopathy, cervical region: Secondary | ICD-10-CM | POA: Diagnosis not present

## 2019-07-16 DIAGNOSIS — M25511 Pain in right shoulder: Secondary | ICD-10-CM | POA: Diagnosis not present

## 2019-07-27 DIAGNOSIS — M67911 Unspecified disorder of synovium and tendon, right shoulder: Secondary | ICD-10-CM | POA: Diagnosis not present

## 2019-08-23 DIAGNOSIS — M25551 Pain in right hip: Secondary | ICD-10-CM | POA: Diagnosis not present

## 2019-08-23 DIAGNOSIS — M1611 Unilateral primary osteoarthritis, right hip: Secondary | ICD-10-CM | POA: Diagnosis not present

## 2019-09-03 DIAGNOSIS — M5412 Radiculopathy, cervical region: Secondary | ICD-10-CM | POA: Diagnosis not present

## 2019-09-03 DIAGNOSIS — M25511 Pain in right shoulder: Secondary | ICD-10-CM | POA: Diagnosis not present

## 2019-09-03 DIAGNOSIS — M5416 Radiculopathy, lumbar region: Secondary | ICD-10-CM | POA: Diagnosis not present

## 2019-09-03 DIAGNOSIS — M4316 Spondylolisthesis, lumbar region: Secondary | ICD-10-CM | POA: Diagnosis not present

## 2019-09-18 ENCOUNTER — Telehealth: Payer: Self-pay | Admitting: Internal Medicine

## 2019-09-18 NOTE — Telephone Encounter (Signed)
Pt has an appt with Dr. Henrene Pastor on 6/9 but states that she cannot wait than long. She would like a call back, she stated that she has two questions that can be answered over the phone, she was very adamant about speaking with a nurse in the meantime. Pls call pt.

## 2019-09-18 NOTE — Telephone Encounter (Signed)
Pt states she has been having 7-8 stools/day, reports they are formed not diarrhea. States she will have a BM and go lay down and have to get back to the bathroom quickly but only pass a small amount of stool. Having urgency and frequent stools. Pt scheduled to see Dr. Henrene Pastor 09/20/19@3 :40pm. Pt aware of appt.

## 2019-09-20 ENCOUNTER — Encounter: Payer: Self-pay | Admitting: Internal Medicine

## 2019-09-20 ENCOUNTER — Ambulatory Visit: Payer: PPO | Admitting: Internal Medicine

## 2019-09-20 ENCOUNTER — Other Ambulatory Visit: Payer: Self-pay

## 2019-09-20 VITALS — BP 144/76 | HR 80 | Temp 98.0°F | Ht 61.0 in | Wt 185.0 lb

## 2019-09-20 DIAGNOSIS — R194 Change in bowel habit: Secondary | ICD-10-CM | POA: Diagnosis not present

## 2019-09-20 DIAGNOSIS — R195 Other fecal abnormalities: Secondary | ICD-10-CM

## 2019-09-20 NOTE — Progress Notes (Signed)
HISTORY OF PRESENT ILLNESS:  Kathryn Romero is a 84 y.o. female with a history of right colon cancer in 2004 for which she is status post right hemicolectomy.  She presents today with a chief complaint of increased frequency of bowel movements over the past month.  This is coincident with taking half a dozen herbal type supplements, obtained from [place], that she hopes to help with her hip ailment.  She reports 1-2 bowel movements per day that are formed, normally.  This month 6 or 7 bowel movements per day.  Mostly formed though more recently somewhat loose.  No bleeding.  No abdominal pain.  No fevers.  No new medications.  No recent antibiotic exposure.  She looks well.  She is always concerned about any change in her bowel habits because of her history of colon cancer.  CT scan of the abdomen in May 2019 to evaluate abdominal pain revealed mild diverticulitis.  She was treated and improved.  Last complete colonoscopy July 2015.  Last upper endoscopy 2009  REVIEW OF SYSTEMS:  All non-GI ROS negative unless otherwise stated in the HPI except for hip pain, arthritis, visual impairment  Past Medical History:  Diagnosis Date  . ANEMIA-NOS   . ANXIETY   . Arthritis   . Cancer [facility]) 2005   colon cancer  . Chronic back pain    spondylolisthesis  . History of blood transfusion    no abnormal reaction noted  . History of shingles   . HYPERLIPIDEMIA    takes Pravastatin daily  . Legally blind   . Nocturia   . RETINITIS PIGMENTOSA   . SYNDROME, CARPAL TUNNEL   . Weakness    numbness and tingling in legs and feet    Past Surgical History:  Procedure Laterality Date  . ABDOMINAL HYSTERECTOMY  1989  . ANTERIOR LAT LUMBAR FUSION Right 01/06/2018   Procedure: Right Lumbar Two-Three Anterolateral lumbar interbody fusion with lateral plate;  Surgeon: Erline Levine, MD;  Location: [place];  Service: Neurosurgery;  Laterality: Right;  Right Lumbar Two-Three Anterolateral lumbar interbody fusion  with lateral plate  . APPENDECTOMY  1946  . carapl tunnel release Left   . cataract surgery Bilateral   . COLONOSCOPY    . MAXIMUM ACCESS (MAS)POSTERIOR LUMBAR INTERBODY FUSION (PLIF) 2 LEVEL N/A 11/14/2015   Procedure: Lumbar Four-Five Maximum access posterior lumbar interbody fusion, Lumbar Three-Four Lumbar Four-Five Posterolateral Fusion and Pedicle Screws;  Surgeon: Erline Levine, MD;  Location: [place] [facility];  Service: Neurosurgery;  Laterality: N/A;  L3-4 L4-5 Maximum access posterior lumbar interbody fusion  . OOPHORECTOMY  1989  . s/p ganglion cyst  1973  . s/p right hemicolectomy  12 yrs ago    Social History Aarion G Foell  reports that she has never smoked. She has never used smokeless tobacco. She reports that she does not drink alcohol or use drugs.  family history includes ALS in her cousin; Breast cancer in her maternal aunt; Polymyalgia rheumatica in her cousin.  Allergies  Allergen Reactions  . Codeine Shortness Of Breath  . Hydromet [Hydrocodone-Homatropine] Shortness Of Breath and Other (See Comments)    Wheezing  . Fish Allergy Other (See Comments)    Burning Sensation and Headache  . Morphine Other (See Comments)    "feels funny"  . Sulfa Antibiotics Nausea And Vomiting            PHYSICAL EXAMINATION: Vital signs: BP (!) 144/76   Pulse 80   Temp 98 F (36.7  C)   Ht 5\' 1"  (1.549 m)   Wt 185 lb (83.9 kg)   SpO2 95%   BMI 34.96 kg/m   Constitutional: generally well-appearing, no acute distress Psychiatric: alert and oriented x3, cooperative Eyes: extraocular movements intact, anicteric, conjunctiva pink Mouth: oral pharynx moist, no lesions Neck: supple no lymphadenopathy Cardiovascular: heart regular rate and rhythm, no murmur Lungs: clear to auscultation bilaterally Abdomen: soft, obese, nontender, nondistended, no obvious ascites, no peritoneal signs, normal bowel sounds, no organomegaly Rectal: Omitted Extremities: no clubbing, cyanosis, or  lower extremity edema bilaterally Skin: no lesions on visible extremities Neuro: No focal deficits.  Cranial nerves intact  ASSESSMENT:  1.  Change in bowel habits.  Increased frequency and somewhat loose at this point.  Coincident with initiating multiple herbal supplements.  May be related to herbal supplements.  No worrisome features. 2.  History of colon cancer 2004 status post right hemicolectomy.  Last colonoscopy 2015   PLAN:  1.  Advised to stop all supplements and observe.  If her problems with her bowels resolve, then she may reintroduce supplements (understanding that I am not advising her to do this and have no idea whether the supplements are helpful or not) if she wishes.  She can then observe to see if issues with her bowels returned.  If so, it is seemingly attributable to the supplements. 2.  If despite stopping the supplements she continues to have problems with her change in bowel habits and loose stools, I have asked her to contact this office.  At that time we would investigate little further and/or provide empiric therapies. A total time of 30 minutes was spent preparing to see the patient, reviewing test, obtaining history, performing comprehensive physical exam, counseling the patient regarding her above issues, providing recommendations for follow-up, and documenting clinical information in the health record

## 2019-09-20 NOTE — Patient Instructions (Signed)
Please follow up as needed 

## 2019-10-11 ENCOUNTER — Telehealth: Payer: Self-pay | Admitting: Internal Medicine

## 2019-10-11 NOTE — Telephone Encounter (Signed)
Patient having a lot of diarrhea

## 2019-10-11 NOTE — Telephone Encounter (Signed)
Pt states when she saw Dr. Henrene Pastor last she was having some loose stool/diarrhea and she was taking a lot of herbal supplements. She was instructed to stop those and see if she improved. Pt states she stopped for one day and then it started back. This afternoon she ate lunch and had to rush to the bathroom and she did not make it, she had an accident. She states it does not happen every time but when it does she has very little warning. Pt stated she was instructed to call back if she continued to have problems. Please advise.

## 2019-10-12 NOTE — Telephone Encounter (Signed)
Spoke with patient, pt advised of recommendation to start Citrucel 2 tablespoons daily. Pt states that she wants to figure out what is wrong, pt states that she hasn't been having as many episodes of not being able to make it to the restroom but it still does happen. Pt states that she will try the Citrucel for a little while and if that does not help she will try to get scheduled for a follow up visit for further work up. Pt states that she will call office with any other questions or concerns.

## 2019-10-12 NOTE — Telephone Encounter (Signed)
Have her take Citrucel 2 tablespoons daily to improve bowel consistency

## 2019-10-12 NOTE — Telephone Encounter (Signed)
Lm on vm for patient to return call 

## 2019-10-17 ENCOUNTER — Ambulatory Visit: Payer: PPO | Admitting: Internal Medicine

## 2019-11-29 DIAGNOSIS — H903 Sensorineural hearing loss, bilateral: Secondary | ICD-10-CM | POA: Diagnosis not present

## 2019-11-29 DIAGNOSIS — H6121 Impacted cerumen, right ear: Secondary | ICD-10-CM | POA: Diagnosis not present

## 2019-11-29 DIAGNOSIS — H608X3 Other otitis externa, bilateral: Secondary | ICD-10-CM | POA: Diagnosis not present

## 2019-12-04 DIAGNOSIS — M545 Low back pain: Secondary | ICD-10-CM | POA: Diagnosis not present

## 2019-12-04 DIAGNOSIS — R2689 Other abnormalities of gait and mobility: Secondary | ICD-10-CM | POA: Diagnosis not present

## 2019-12-04 DIAGNOSIS — M25551 Pain in right hip: Secondary | ICD-10-CM | POA: Diagnosis not present

## 2019-12-05 DIAGNOSIS — Z0000 Encounter for general adult medical examination without abnormal findings: Secondary | ICD-10-CM | POA: Diagnosis not present

## 2019-12-05 DIAGNOSIS — M48061 Spinal stenosis, lumbar region without neurogenic claudication: Secondary | ICD-10-CM | POA: Diagnosis not present

## 2019-12-05 DIAGNOSIS — Z1331 Encounter for screening for depression: Secondary | ICD-10-CM | POA: Diagnosis not present

## 2019-12-05 DIAGNOSIS — N390 Urinary tract infection, site not specified: Secondary | ICD-10-CM | POA: Diagnosis not present

## 2019-12-05 DIAGNOSIS — K219 Gastro-esophageal reflux disease without esophagitis: Secondary | ICD-10-CM | POA: Diagnosis not present

## 2019-12-05 DIAGNOSIS — G5602 Carpal tunnel syndrome, left upper limb: Secondary | ICD-10-CM | POA: Diagnosis not present

## 2019-12-05 DIAGNOSIS — N3281 Overactive bladder: Secondary | ICD-10-CM | POA: Diagnosis not present

## 2019-12-05 DIAGNOSIS — R7301 Impaired fasting glucose: Secondary | ICD-10-CM | POA: Diagnosis not present

## 2019-12-05 DIAGNOSIS — E785 Hyperlipidemia, unspecified: Secondary | ICD-10-CM | POA: Diagnosis not present

## 2019-12-05 DIAGNOSIS — I10 Essential (primary) hypertension: Secondary | ICD-10-CM | POA: Diagnosis not present

## 2019-12-05 DIAGNOSIS — F419 Anxiety disorder, unspecified: Secondary | ICD-10-CM | POA: Diagnosis not present

## 2019-12-05 DIAGNOSIS — H547 Unspecified visual loss: Secondary | ICD-10-CM | POA: Diagnosis not present

## 2019-12-18 DIAGNOSIS — G5602 Carpal tunnel syndrome, left upper limb: Secondary | ICD-10-CM | POA: Diagnosis not present

## 2019-12-18 DIAGNOSIS — R200 Anesthesia of skin: Secondary | ICD-10-CM | POA: Diagnosis not present

## 2019-12-26 DIAGNOSIS — M545 Low back pain: Secondary | ICD-10-CM | POA: Diagnosis not present

## 2019-12-26 DIAGNOSIS — R2689 Other abnormalities of gait and mobility: Secondary | ICD-10-CM | POA: Diagnosis not present

## 2019-12-26 DIAGNOSIS — M25551 Pain in right hip: Secondary | ICD-10-CM | POA: Diagnosis not present

## 2020-01-01 DIAGNOSIS — M25551 Pain in right hip: Secondary | ICD-10-CM | POA: Diagnosis not present

## 2020-01-01 DIAGNOSIS — R2689 Other abnormalities of gait and mobility: Secondary | ICD-10-CM | POA: Diagnosis not present

## 2020-01-01 DIAGNOSIS — M545 Low back pain: Secondary | ICD-10-CM | POA: Diagnosis not present

## 2020-01-08 DIAGNOSIS — M25551 Pain in right hip: Secondary | ICD-10-CM | POA: Diagnosis not present

## 2020-01-08 DIAGNOSIS — R2689 Other abnormalities of gait and mobility: Secondary | ICD-10-CM | POA: Diagnosis not present

## 2020-01-08 DIAGNOSIS — M545 Low back pain: Secondary | ICD-10-CM | POA: Diagnosis not present

## 2020-01-21 DIAGNOSIS — H10413 Chronic giant papillary conjunctivitis, bilateral: Secondary | ICD-10-CM | POA: Diagnosis not present

## 2020-01-21 DIAGNOSIS — Z961 Presence of intraocular lens: Secondary | ICD-10-CM | POA: Diagnosis not present

## 2020-01-21 DIAGNOSIS — H04123 Dry eye syndrome of bilateral lacrimal glands: Secondary | ICD-10-CM | POA: Diagnosis not present

## 2020-01-21 DIAGNOSIS — H3552 Pigmentary retinal dystrophy: Secondary | ICD-10-CM | POA: Diagnosis not present

## 2020-01-24 DIAGNOSIS — G5602 Carpal tunnel syndrome, left upper limb: Secondary | ICD-10-CM | POA: Diagnosis not present

## 2020-01-24 DIAGNOSIS — M545 Low back pain: Secondary | ICD-10-CM | POA: Diagnosis not present

## 2020-01-24 DIAGNOSIS — M48061 Spinal stenosis, lumbar region without neurogenic claudication: Secondary | ICD-10-CM | POA: Diagnosis not present

## 2020-01-24 DIAGNOSIS — M5136 Other intervertebral disc degeneration, lumbar region: Secondary | ICD-10-CM | POA: Diagnosis not present

## 2020-01-24 DIAGNOSIS — M4316 Spondylolisthesis, lumbar region: Secondary | ICD-10-CM | POA: Diagnosis not present

## 2020-02-14 ENCOUNTER — Other Ambulatory Visit: Payer: Self-pay

## 2020-02-14 ENCOUNTER — Encounter: Payer: Self-pay | Admitting: Sports Medicine

## 2020-02-14 ENCOUNTER — Ambulatory Visit (INDEPENDENT_AMBULATORY_CARE_PROVIDER_SITE_OTHER): Payer: PPO | Admitting: Sports Medicine

## 2020-02-14 VITALS — BP 146/61 | Ht 61.0 in | Wt 185.0 lb

## 2020-02-14 DIAGNOSIS — G8929 Other chronic pain: Secondary | ICD-10-CM | POA: Diagnosis not present

## 2020-02-14 DIAGNOSIS — M545 Low back pain, unspecified: Secondary | ICD-10-CM

## 2020-02-14 NOTE — Progress Notes (Signed)
   Subjective:    Patient ID: Kathryn Romero, female    DOB: [birth year], 84 y.o.   MRN: [id number]  HPI chief complaint: Right-sided low back pain  Very pleasant 84 year old female comes in today complaining of 6 months of right-sided low back pain.  Pain began without any trauma.  She has a past surgical history significant for a previous lumbar spine fusion done by Dr. Vertell Limber around 2017 although she is not quite sure about the exact date.  She states that she was able to walk well after surgery and was pain-free.  When her most recent pain started, she saw Dr. Wynelle Link who told her that she had advanced arthritis in the right hip.  Although she does endorse some intermittent groin pain, majority of her pain is in the posterior hip localized near the right SI joint.  Pain is worse with standing or sitting too long.  Lying down is less painful.  No numbness or tingling down the leg.  Her most recent set of x-rays were done at Dr.Aluisio's office but are not available for review.  Past medical history and surgical history reviewed Medications reviewed Allergies reviewed   Review of Systems    As above Objective:   Physical Exam  Well-developed, well-nourished.  No acute distress.  Sitting comfortably in the exam room.  Right hip: Patient does have limited internal and external rotation of the right hip passively but this does not reproduce groin pain.  She is tender to palpation directly over the right SI joint and does have some pain with a modified Faber's test.  No tenderness to palpation along the lumbar midline.  No appreciable spasm.  Neurovascular intact distally.  She walks with an antalgic gait.      Assessment & Plan:   Right-sided low back pain-question SI joint arthropathy Status post remote lumbar spine fusion Right hip osteoarthritis  Although the patient has been told in the past that she has right hip osteoarthritis, her presentation today suggests more of an SI joint  etiology of her pain.  Therefore, I recommend a diagnostic/therapeutic injection to be done at [facility] with a subsequent follow-up with me 1 week later.  She will call with questions or concerns in the interim.

## 2020-02-15 ENCOUNTER — Other Ambulatory Visit: Payer: Self-pay | Admitting: Sports Medicine

## 2020-02-18 ENCOUNTER — Other Ambulatory Visit: Payer: Self-pay | Admitting: Sports Medicine

## 2020-02-18 DIAGNOSIS — M545 Low back pain, unspecified: Secondary | ICD-10-CM

## 2020-02-18 DIAGNOSIS — G8929 Other chronic pain: Secondary | ICD-10-CM

## 2020-02-18 NOTE — Addendum Note (Signed)
Addended by: Jolinda Croak E on: 02/18/2020 09:19 AM   Modules accepted: Orders

## 2020-02-22 ENCOUNTER — Ambulatory Visit
Admission: RE | Admit: 2020-02-22 | Discharge: 2020-02-22 | Disposition: A | Discharge status: Home / Self Care | Payer: PPO | Source: Ambulatory Visit | Attending: Sports Medicine | Admitting: Sports Medicine

## 2020-02-22 DIAGNOSIS — M79651 Pain in right thigh: Secondary | ICD-10-CM | POA: Diagnosis not present

## 2020-02-22 DIAGNOSIS — G8929 Other chronic pain: Secondary | ICD-10-CM

## 2020-02-22 MED ORDER — METHYLPREDNISOLONE ACETATE 40 MG/ML INJ SUSP (RADIOLOG
120.0000 mg | Freq: Once | INTRAMUSCULAR | Status: AC
  Administered 2020-02-22: 120 mg via INTRA_ARTICULAR

## 2020-02-22 MED ORDER — IOPAMIDOL (ISOVUE-M 200) INJECTION 41%
1.0000 mL | Freq: Once | INTRAMUSCULAR | Status: AC
  Administered 2020-02-22: 1 mL via INTRA_ARTICULAR

## 2020-02-22 NOTE — Discharge Instructions (Signed)

## 2020-02-28 ENCOUNTER — Ambulatory Visit (INDEPENDENT_AMBULATORY_CARE_PROVIDER_SITE_OTHER): Payer: PPO | Admitting: Sports Medicine

## 2020-02-28 ENCOUNTER — Other Ambulatory Visit: Payer: Self-pay

## 2020-02-28 ENCOUNTER — Telehealth: Payer: Self-pay | Admitting: Internal Medicine

## 2020-02-28 VITALS — BP 138/63 | Ht 61.0 in | Wt 185.0 lb

## 2020-02-28 DIAGNOSIS — M545 Low back pain, unspecified: Secondary | ICD-10-CM | POA: Diagnosis not present

## 2020-02-28 DIAGNOSIS — G8929 Other chronic pain: Secondary | ICD-10-CM | POA: Diagnosis not present

## 2020-02-28 NOTE — Telephone Encounter (Signed)
°   ADDILYNN MOWRER DOB: 27-Nov-1935 MRN: [id number]   RIDER WAIVER AND RELEASE OF LIABILITY  For purposes of improving physical access to our facilities, [facility] is pleased to partner with third parties to provide [facility] patients or other authorized individuals the option of convenient, on-demand ground transportation services (the [employer]) through use of the technology service that enables users to request on-demand ground transportation from independent third-party providers.  By opting to use and accept these [employer], I, the undersigned, hereby agree on behalf of myself, and on behalf of any minor child using the [employer] for whom I am the parent or legal guardian, as follows:  1. Government social research officer provided to me are provided by independent third-party transportation providers who are not [employer] or employees and who are unaffiliated with [employer]. 2. [place] is neither a transportation carrier nor a common or public carrier. 3. [facility] has no control over the quality or safety of the transportation that occurs as a result of the [employer]. 4. [facility] cannot guarantee that any third-party transportation provider will complete any arranged transportation service. 5. [facility] makes no representation, warranty, or guarantee regarding the reliability, timeliness, quality, safety, suitability, or availability of any of the Transport Services or that they will be error free. 6. I fully understand that traveling by vehicle involves risks and dangers of serious bodily injury, including permanent disability, paralysis, and death. I agree, on behalf of myself and on behalf of any minor child using the Transport Services for whom I am the parent or legal guardian, that the entire risk arising out of my use of the [employer] remains solely with me, to the maximum extent permitted under applicable law. 7. The [employer] are provided as is and as available. [place] disclaims all representations and warranties, express, implied or statutory, not expressly set out in these terms, including the implied warranties of merchantability and fitness for a particular purpose. 8. I hereby waive and release [facility], its agents, employees, officers, directors, representatives, insurers, attorneys, assigns, successors, subsidiaries, and affiliates from any and all past, present, or future claims, demands, liabilities, actions, causes of action, or suits of any kind directly or indirectly arising from acceptance and use of the [employer]. 9. I further waive and release [facility] and its affiliates from all present and future liability and responsibility for any injury or death to persons or damages to property caused by or related to the use of the [employer]. 10. I have read this Waiver and Release of Liability, and I understand the terms used in it and their legal significance. This Waiver is freely and voluntarily given with the understanding that my right (as well as the right of any minor child for whom I am the parent or legal guardian using the [employer]) to legal recourse against [facility] in connection with the [employer] is knowingly surrendered in return for use of these services.   I attest that I read the consent document to [place], gave Ms. Babington the opportunity to ask questions and answered the questions asked (if any). I affirm that [place] then provided consent for she's participation in this program.    Drucie Ip

## 2020-02-29 NOTE — Progress Notes (Signed)
Patient ID: Kathryn Romero, female   DOB: 07/02/1935, 84 y.o.   MRN: [id number]  Patient comes in today for follow-up after a recent diagnostic/therapeutic right SI joint injection done under fluoroscopy at [facility].  Unfortunately, she did not notice much benefit from the injection.  Pain remains primarily in the posterior lateral hip and spine area.  Minimal discomfort in the right groin.  She has a history of lumbar spine fusion done by Dr. Vertell Limber a few years ago.  At this point in time I recommended that she return to Dr. Vertell Limber for reevaluation.  He may elect to order advanced imaging or try some other sort of spinal injection but I will obviously defer these decisions to his discretion.  Patient agrees with this plan.  Follow-up with me as needed.

## 2020-03-05 ENCOUNTER — Ambulatory Visit: Payer: PPO | Admitting: Interventional Cardiology

## 2020-03-05 ENCOUNTER — Other Ambulatory Visit: Payer: Self-pay

## 2020-03-05 ENCOUNTER — Ambulatory Visit: Payer: PPO | Attending: Neurosurgery | Admitting: Physical Therapy

## 2020-03-05 ENCOUNTER — Encounter: Payer: Self-pay | Admitting: Physical Therapy

## 2020-03-05 DIAGNOSIS — G8929 Other chronic pain: Secondary | ICD-10-CM | POA: Diagnosis not present

## 2020-03-05 DIAGNOSIS — M5136 Other intervertebral disc degeneration, lumbar region: Secondary | ICD-10-CM | POA: Diagnosis not present

## 2020-03-05 DIAGNOSIS — M25551 Pain in right hip: Secondary | ICD-10-CM | POA: Diagnosis not present

## 2020-03-05 DIAGNOSIS — M545 Low back pain, unspecified: Secondary | ICD-10-CM

## 2020-03-05 DIAGNOSIS — M48061 Spinal stenosis, lumbar region without neurogenic claudication: Secondary | ICD-10-CM | POA: Diagnosis not present

## 2020-03-05 DIAGNOSIS — R2689 Other abnormalities of gait and mobility: Secondary | ICD-10-CM | POA: Diagnosis not present

## 2020-03-05 DIAGNOSIS — M6281 Muscle weakness (generalized): Secondary | ICD-10-CM | POA: Diagnosis not present

## 2020-03-05 DIAGNOSIS — M4316 Spondylolisthesis, lumbar region: Secondary | ICD-10-CM | POA: Diagnosis not present

## 2020-03-05 NOTE — Therapy (Addendum)
[place], [place], [address] Phone: [phone]   Fax:  [phone]  Physical Therapy Evaluation / Discharge  Patient Details  Name: Kathryn Romero MRN: [id number] Date of Birth: August 18, 1935 Referring Provider (PT): Erline Levine, MD   Encounter Date: 03/05/2020   PT End of Session - 03/05/20 1151    Visit Number 1    Number of Visits 6    Date for PT Re-Evaluation 04/16/20    Authorization Type [employer]    PT Start Time 1130    PT Stop Time 1215    PT Time Calculation (min) 45 min    Activity Tolerance Patient tolerated treatment well    Behavior During Therapy [facility] for tasks assessed/performed           Past Medical History:  Diagnosis Date  . ANEMIA-NOS   . ANXIETY   . Arthritis   . Cancer [facility]) 2005   colon cancer  . Chronic back pain    spondylolisthesis  . History of blood transfusion    no abnormal reaction noted  . History of shingles   . HYPERLIPIDEMIA    takes Pravastatin daily  . Legally blind   . Nocturia   . RETINITIS PIGMENTOSA   . SYNDROME, CARPAL TUNNEL   . Weakness    numbness and tingling in legs and feet    Past Surgical History:  Procedure Laterality Date  . ABDOMINAL HYSTERECTOMY  1989  . ANTERIOR LAT LUMBAR FUSION Right 01/06/2018   Procedure: Right Lumbar Two-Three Anterolateral lumbar interbody fusion with lateral plate;  Surgeon: Erline Levine, MD;  Location: [place];  Service: Neurosurgery;  Laterality: Right;  Right Lumbar Two-Three Anterolateral lumbar interbody fusion with lateral plate  . APPENDECTOMY  1946  . carapl tunnel release Left   . cataract surgery Bilateral   . COLONOSCOPY    . MAXIMUM ACCESS (MAS)POSTERIOR LUMBAR INTERBODY FUSION (PLIF) 2 LEVEL N/A 11/14/2015   Procedure: Lumbar Four-Five Maximum access posterior lumbar interbody fusion, Lumbar Three-Four Lumbar Four-Five Posterolateral Fusion and Pedicle Screws;  Surgeon: Erline Levine, MD;  Location: [place] [facility];  Service: Neurosurgery;  Laterality: N/A;  L3-4 L4-5 Maximum access posterior lumbar interbody fusion  . OOPHORECTOMY  1989  . s/p ganglion cyst  1973  . s/p right hemicolectomy  12 yrs ago    There were no vitals filed for this visit.    Subjective Assessment - 03/05/20 1137    Subjective Patient reports right lower back and posterior right hip pain with walking or sitting too long. It feels like a knot in the back of the right hip. She has to lay down if her hip is painful. Patient is legally blind and she cannot walk well and looses her balance frequently. She feels her walking problems are contributing to her right lower back and hip pain because of increased tension. Some days are better than other for her. Patient also notes occasional right sided neck pain.    Pertinent History Lumbar surgery in 2017    Limitations Sitting;Standing;House hold activities;Walking    How long can you sit comfortably? "Not too long"    How long can you stand comfortably? Unsure    How long can you walk comfortably? "Not too long, depends on if she can see"    Diagnostic tests X-ray    Patient Stated Goals Get back feeling better so she can walk better    Currently in Pain? Yes  Pain Score 6     Pain Location Back    Pain Orientation Right;Lower    Pain Descriptors / Indicators Tightness   "feels like a knot"   Pain Type Chronic pain    Pain Radiating Towards Occasionally will travel up her back on the right side, right shin, right neck    Pain Onset More than a month ago    Pain Frequency Intermittent    Aggravating Factors  Walking or sitting    Pain Relieving Factors Laying down    Effect of Pain on Daily Activities Walking              [facility] PT Assessment - 03/05/20 0001      Assessment   Medical Diagnosis Low back pain    Referring Provider (PT) Erline Levine, MD    Onset Date/Surgical Date --   patient reports onset of pain >1 year ago   Next MD  Visit Not scheduled - patient reports she has phone call with Dr. Vertell Limber on 03/05/2020    Prior Therapy Yes      Precautions   Precautions None      Restrictions   Weight Bearing Restrictions No      Balance Screen   Has the patient fallen in the past 6 months No    Has the patient had a decrease in activity level because of a fear of falling?  No    Is the patient reluctant to leave their home because of a fear of falling?  No      Home Environment   Living Environment Private residence    Living Arrangements Alone    Type of [place] entrance    [place] Two level   patient does not go up stairs     Prior Function   Level of Independence Independent with household mobility with device;Needs assistance with homemaking      Cognition   Overall Cognitive Status Within Functional Limits for tasks assessed      Observation/Other Assessments   Observations Patient appears in no apparent distress    Focus on Therapeutic Outcomes (FOTO)  30% functional status      Sensation   Light Touch Appears Intact      Coordination   Gross Motor Movements are Fluid and Coordinated Yes      Functional Tests   Functional tests Sit to Stand      Sit to Stand   Comments Patient requires BUE and occasional min-assist for sit<>stand from standard height chair      Posture/Postural Control   Posture Comments Rounded shoulder and forward head posture      ROM / Strength   AROM / PROM / Strength AROM;PROM;Strength      AROM   Overall AROM Comments Lumbar AROM grossly limited, assessment limited due to balance and vision deficits, lumbar flexion increased right sided pain      PROM   Overall PROM Comments Right hip PROM grossly limited especially with rotational movements which elicit concordant posterior hip pain      Strength   Strength Assessment Site Hip;Knee    Right/Left Hip Right;Left    Right Hip Flexion 4-/5    Right Hip Extension 3-/5    Right Hip  ABduction 3-/5    Left Hip Flexion 4-/5    Left Hip Extension 3-/5    Left Hip ABduction 3-/5    Right/Left Knee Right;Left    Right  Knee Flexion 4/5    Right Knee Extension 4+/5    Left Knee Flexion 4/5    Left Knee Extension 4+/5      Flexibility   Soft Tissue Assessment /Muscle Length yes    Hamstrings Limited bilaterally    Piriformis Limited on right, likely due to joint mobility      Palpation   Spinal mobility Not assessed    SI assessment  Not assessed    Palpation comment TTP right posterior hip and gluteal region, SIJ, lumbar paraspinals      Ambulation/Gait   Ambulation/Gait Yes    Ambulation/Gait Assistance 4: Min assist    Ambulation/Gait Assistance Details Patient required hand-hold assist for balance with ambulation    Assistive device Straight cane   right   Gait Comments Decreased gait speed, shuffling and unsteady gait, increased trunk sway                      Objective measurements completed on examination: See above findings.       [facility] Adult PT Treatment/Exercise - 03/05/20 0001      Exercises   Exercises Lumbar      Lumbar Exercises: Seated   Sit to Stand 10 reps    Sit to Stand Limitations BUE assist hands on thighs      Lumbar Exercises: Supine   Bridge 10 reps   2 sets   Straight Leg Raise 10 reps   2 sets     Lumbar Exercises: Sidelying   Hip Abduction 10 reps   2 sets                 PT Education - 03/05/20 1150    Education Details Exam findings, POC, HEP, likely more beneficial to have HHPT    Person(s) Educated Patient    Methods Explanation;Demonstration;Tactile cues;Verbal cues;Handout    Comprehension Verbalized understanding;Returned demonstration;Verbal cues required;Tactile cues required;Need further instruction            PT Short Term Goals - 03/05/20 1242      PT SHORT TERM GOAL #1   Title STG = LTG             PT Long Term Goals - 03/05/20 1243      PT LONG TERM GOAL #1   Title  Patient will be I with HEP to progress mobility and strength to reduce pain and improve walking    Time 6    Period Weeks    Status New    Target Date 04/16/20      PT LONG TERM GOAL #2   Title Patient will be able to perform 5 consecutive STS without UE assist to indicate improve strength and balance    Time 6    Period Weeks    Status New    Target Date 04/16/20      PT LONG TERM GOAL #3   Title Patient will be able to walk >/= 100 ft using SPC without hand-hold assist to improve independence    Time 6    Period Weeks    Status New    Target Date 04/16/20      PT LONG TERM GOAL #4   Title Patient will report </= 3/10 pain level with sitting or walking extended periods to reduce functional limitation    Time 6    Period Weeks    Status New    Target Date 04/16/20      PT LONG TERM  GOAL #5   Title Patient will report improved functional status >/= 48% on FOTO    Time 6    Period Weeks    Status New    Target Date 04/16/20                  Plan - 03/05/20 1151    Clinical Impression Statement Patient presents with chronic right posterior hip and lower back pain with sitting or walking extended periods. Patient's symptoms are likely multifactorial related to right hip, SIJ, and lumbar region. Her pain doesn't seem to have any radicular component. She exhibits limitation with all lumbar mobility with difficulty assessing due to balance, right hip demonstrates limitation with motion and reporduced posterior hip pain with rotational testing, she exhibits gross hip and core strength weakness, tenderness to palpation to posterior hip, SIJ, and lumbar paraspinals, gait deficits that are likely more related to visual impairments. Patient was provided exercises to initiate hip and core strengthening, patient will be unable to see pictures for handout so thoroughly explained and demonstrated by patient. She would benefit from continued skilled PT to progress her mobility and  strength in order to reduce pain and improve walking ability. Due to patients visual deficit and transportation she would likely benefit more from home health physical therapy to perform exercises in a familiar environment for carry over and consistency.    Personal Factors and Comorbidities Comorbidity 3+;Fitness;Past/Current Experience;Time since onset of injury/illness/exacerbation;Age    Comorbidities Previous lumbar surgery, BMI, visual deficits    Examination-Activity Limitations Locomotion Level;Bend;Sit;Stand;Lift    Examination-Participation Restrictions Meal Prep;Cleaning;Community Activity;Shop;Laundry    Stability/Clinical Decision Making Evolving/Moderate complexity    Clinical Decision Making Moderate    Rehab Potential Fair    PT Frequency 1x / week    PT Duration 6 weeks    PT Treatment/Interventions ADLs/Self Care Home Management;Aquatic Therapy;Cryotherapy;Electrical Stimulation;Iontophoresis 52m/ml Dexamethasone;Moist Heat;Traction;Ultrasound;Neuromuscular re-education;Balance training;Therapeutic exercise;Therapeutic activities;Functional mobility training;Stair training;Gait training;Patient/family education;Manual techniques;Dry needling;Passive range of motion;Taping;Spinal Manipulations;Joint Manipulations    PT Next Visit Plan Review HEP and progress PRN, right hip joint manual for improved mobility, manual/STM for right posterior hip and lumbar paraspinals, progress core and hip strengthening, gait training    PT Home Exercise Plan [email or web address]: SLR, bridge, sidelying hip abduction, sit<>stand    Recommended Other [facility]    Consulted and Agree with Plan of Care Patient           Patient will benefit from skilled therapeutic intervention in order to improve the following deficits and impairments:  Abnormal gait, Decreased range of motion, Difficulty walking, Decreased activity tolerance, Pain, Decreased balance, Impaired flexibility, Postural dysfunction,  Decreased strength  Visit Diagnosis: Pain in right hip  Chronic right-sided low back pain, unspecified whether sciatica present  Muscle weakness (generalized)  Other abnormalities of gait and mobility     Problem List Patient Active Problem List   Diagnosis Date Noted  . Degenerative lumbar spinal stenosis 01/06/2018  . Spondylolisthesis at L4-L5 level 11/14/2015  . DOE (dyspnea on exertion) 10/14/2014  . Fatigue 09/19/2014  . Obese 05/20/2014  . RETINITIS PIGMENTOSA 12/04/2008  . GLUCOSE INTOLERANCE 05/09/2007  . Dyslipidemia 05/09/2007  . ANEMIA-NOS 05/09/2007  . ANXIETY 05/09/2007  . Essential hypertension 05/09/2007  . DIVERTICULOSIS, COLON 05/09/2007  . BACK PAIN 05/09/2007  . History of malignant neoplasm of large intestine 05/09/2007  . ARTHRITIS, TRAUMATIC, UNSPECIFIED SITE 12/17/2006    CHilda Blades PT, DPT, LAT, ATC 03/05/20  12:58 PM Phone: [phone]: [phone]  [place], [place], [address] Phone: [phone]   Fax:  [phone]  Name: Kathryn Romero MRN: [id number] Date of Birth: 23-Dec-1935   PHYSICAL THERAPY DISCHARGE SUMMARY  Visits from Start of Care: 1  Current functional level related to goals / functional outcomes: See above   Remaining deficits: See above   Education / Equipment: HEP Plan:                                                    Patient goals were not met. Patient is being discharged due to not returning since the last visit.  ?????    Hilda Blades, PT, DPT, LAT, ATC 05/12/20  8:43 AM Phone: [phone] Fax: [phone]

## 2020-03-05 NOTE — Patient Instructions (Signed)
Access Code: [email or web address] URL: [email or web address] Date: 03/05/2020 Prepared by: Hilda Blades  Exercises Active Straight Leg Raise with Quad Set - 2 x daily - 7 x weekly - 2 sets - 10 reps Supine Bridge - 2 x daily - 7 x weekly - 2 sets - 10 reps Sidelying Hip Abduction - 2 x daily - 7 x weekly - 2 sets - 10 reps Sit to Stand - 2 x daily - 7 x weekly - 2 sets - 10 reps

## 2020-03-12 ENCOUNTER — Ambulatory Visit: Payer: PPO | Admitting: Interventional Cardiology

## 2020-05-07 DIAGNOSIS — G5602 Carpal tunnel syndrome, left upper limb: Secondary | ICD-10-CM | POA: Diagnosis not present

## 2020-05-07 DIAGNOSIS — M13849 Other specified arthritis, unspecified hand: Secondary | ICD-10-CM | POA: Diagnosis not present

## 2020-05-27 DIAGNOSIS — M5416 Radiculopathy, lumbar region: Secondary | ICD-10-CM | POA: Diagnosis not present

## 2020-05-27 DIAGNOSIS — M48061 Spinal stenosis, lumbar region without neurogenic claudication: Secondary | ICD-10-CM | POA: Diagnosis not present

## 2020-05-27 DIAGNOSIS — M5136 Other intervertebral disc degeneration, lumbar region: Secondary | ICD-10-CM | POA: Diagnosis not present

## 2020-05-27 DIAGNOSIS — M4316 Spondylolisthesis, lumbar region: Secondary | ICD-10-CM | POA: Diagnosis not present

## 2020-06-19 DIAGNOSIS — H3552 Pigmentary retinal dystrophy: Secondary | ICD-10-CM | POA: Diagnosis not present

## 2020-06-19 DIAGNOSIS — I10 Essential (primary) hypertension: Secondary | ICD-10-CM | POA: Diagnosis not present

## 2020-06-19 DIAGNOSIS — H5410 Blindness, one eye, low vision other eye, unspecified eyes: Secondary | ICD-10-CM | POA: Diagnosis not present

## 2020-07-07 DIAGNOSIS — H35373 Puckering of macula, bilateral: Secondary | ICD-10-CM | POA: Diagnosis not present

## 2020-07-07 DIAGNOSIS — H35033 Hypertensive retinopathy, bilateral: Secondary | ICD-10-CM | POA: Diagnosis not present

## 2020-07-07 DIAGNOSIS — H43813 Vitreous degeneration, bilateral: Secondary | ICD-10-CM | POA: Diagnosis not present

## 2020-07-07 DIAGNOSIS — H3552 Pigmentary retinal dystrophy: Secondary | ICD-10-CM | POA: Diagnosis not present

## 2020-07-31 DIAGNOSIS — M5459 Other low back pain: Secondary | ICD-10-CM | POA: Diagnosis not present

## 2020-07-31 DIAGNOSIS — M25551 Pain in right hip: Secondary | ICD-10-CM | POA: Diagnosis not present

## 2020-07-31 DIAGNOSIS — M25552 Pain in left hip: Secondary | ICD-10-CM | POA: Diagnosis not present

## 2020-08-20 DIAGNOSIS — M4316 Spondylolisthesis, lumbar region: Secondary | ICD-10-CM | POA: Diagnosis not present

## 2020-08-20 DIAGNOSIS — M5416 Radiculopathy, lumbar region: Secondary | ICD-10-CM | POA: Diagnosis not present

## 2020-08-20 DIAGNOSIS — M545 Low back pain, unspecified: Secondary | ICD-10-CM | POA: Diagnosis not present

## 2020-08-20 DIAGNOSIS — M5136 Other intervertebral disc degeneration, lumbar region: Secondary | ICD-10-CM | POA: Diagnosis not present

## 2020-09-01 DIAGNOSIS — I10 Essential (primary) hypertension: Secondary | ICD-10-CM | POA: Diagnosis not present

## 2020-09-01 DIAGNOSIS — R739 Hyperglycemia, unspecified: Secondary | ICD-10-CM | POA: Diagnosis not present

## 2020-10-22 DIAGNOSIS — M5136 Other intervertebral disc degeneration, lumbar region: Secondary | ICD-10-CM | POA: Diagnosis not present

## 2020-10-22 DIAGNOSIS — H3552 Pigmentary retinal dystrophy: Secondary | ICD-10-CM | POA: Diagnosis not present

## 2020-10-22 DIAGNOSIS — M545 Low back pain, unspecified: Secondary | ICD-10-CM | POA: Diagnosis not present

## 2020-10-22 DIAGNOSIS — M4316 Spondylolisthesis, lumbar region: Secondary | ICD-10-CM | POA: Diagnosis not present

## 2020-12-11 DIAGNOSIS — R5383 Other fatigue: Secondary | ICD-10-CM | POA: Diagnosis not present

## 2020-12-11 DIAGNOSIS — Z780 Asymptomatic menopausal state: Secondary | ICD-10-CM | POA: Diagnosis not present

## 2020-12-11 DIAGNOSIS — I10 Essential (primary) hypertension: Secondary | ICD-10-CM | POA: Diagnosis not present

## 2020-12-11 DIAGNOSIS — R7303 Prediabetes: Secondary | ICD-10-CM | POA: Diagnosis not present

## 2020-12-18 DIAGNOSIS — Z0000 Encounter for general adult medical examination without abnormal findings: Secondary | ICD-10-CM | POA: Diagnosis not present

## 2020-12-18 DIAGNOSIS — I10 Essential (primary) hypertension: Secondary | ICD-10-CM | POA: Diagnosis not present

## 2020-12-18 DIAGNOSIS — H3552 Pigmentary retinal dystrophy: Secondary | ICD-10-CM | POA: Diagnosis not present

## 2020-12-18 DIAGNOSIS — H5410 Blindness, one eye, low vision other eye, unspecified eyes: Secondary | ICD-10-CM | POA: Diagnosis not present

## 2021-01-20 DIAGNOSIS — Z23 Encounter for immunization: Secondary | ICD-10-CM | POA: Diagnosis not present

## 2021-01-20 DIAGNOSIS — E7439 Other disorders of intestinal carbohydrate absorption: Secondary | ICD-10-CM | POA: Diagnosis not present

## 2021-01-20 DIAGNOSIS — H547 Unspecified visual loss: Secondary | ICD-10-CM | POA: Diagnosis not present

## 2021-01-20 DIAGNOSIS — K219 Gastro-esophageal reflux disease without esophagitis: Secondary | ICD-10-CM | POA: Diagnosis not present

## 2021-01-20 DIAGNOSIS — H8110 Benign paroxysmal vertigo, unspecified ear: Secondary | ICD-10-CM | POA: Diagnosis not present

## 2021-01-20 DIAGNOSIS — M48061 Spinal stenosis, lumbar region without neurogenic claudication: Secondary | ICD-10-CM | POA: Diagnosis not present

## 2021-01-20 DIAGNOSIS — E785 Hyperlipidemia, unspecified: Secondary | ICD-10-CM | POA: Diagnosis not present

## 2021-01-20 DIAGNOSIS — I10 Essential (primary) hypertension: Secondary | ICD-10-CM | POA: Diagnosis not present

## 2021-01-20 DIAGNOSIS — G5602 Carpal tunnel syndrome, left upper limb: Secondary | ICD-10-CM | POA: Diagnosis not present

## 2021-01-20 DIAGNOSIS — Z0000 Encounter for general adult medical examination without abnormal findings: Secondary | ICD-10-CM | POA: Diagnosis not present

## 2021-01-20 DIAGNOSIS — F419 Anxiety disorder, unspecified: Secondary | ICD-10-CM | POA: Diagnosis not present

## 2021-01-20 DIAGNOSIS — N3281 Overactive bladder: Secondary | ICD-10-CM | POA: Diagnosis not present

## 2021-02-04 DIAGNOSIS — Z961 Presence of intraocular lens: Secondary | ICD-10-CM | POA: Diagnosis not present

## 2021-02-04 DIAGNOSIS — H3552 Pigmentary retinal dystrophy: Secondary | ICD-10-CM | POA: Diagnosis not present

## 2021-03-04 ENCOUNTER — Encounter (INDEPENDENT_AMBULATORY_CARE_PROVIDER_SITE_OTHER): Payer: PPO | Admitting: Ophthalmology

## 2021-03-04 DIAGNOSIS — G8929 Other chronic pain: Secondary | ICD-10-CM | POA: Diagnosis not present

## 2021-03-04 DIAGNOSIS — M5441 Lumbago with sciatica, right side: Secondary | ICD-10-CM | POA: Diagnosis not present

## 2021-03-04 DIAGNOSIS — M5416 Radiculopathy, lumbar region: Secondary | ICD-10-CM | POA: Diagnosis not present

## 2021-03-04 NOTE — Progress Notes (Signed)
[facility] Note  03/06/2021     CHIEF COMPLAINT Patient presents for Retina Evaluation   HISTORY OF PRESENT ILLNESS: Kathryn Romero is a 85 y.o. female who presents to the clinic today for:   HPI     Retina Evaluation   In both eyes.  This started years ago.  Duration of years.  Context:  distance vision, mid-range vision and near vision.  Treatments tried include artificial tears.  Response to treatment was no improvement.  I, the attending physician,  performed the HPI with the patient and updated documentation appropriately.        Comments   85 y/o female pt referred by Dr. Zenia Resides for ret eval.  Saw Dr. Clent Jacks in the past, and saw Dr. Zigmund Daniel in 2017, but is not sure exactly when her last eye exam was.  VA has been extremely poor for many yrs, but pt is unsure why.  Denies pain, FOL, floaters.  Uses Castor Oil in her eyes prn to aid w/itching, but she has not been using it much as of late.      Last edited by Bernarda Caffey, MD on 03/08/2021  3:06 AM.    Pt is here on the referral of Dr. Nicki Reaper for concern of RP, pt used to see Dr. Zigmund Daniel, but hasn't seen him since 2017, pt has also been seen at [facility] and [place], pt saw Dr. Cordelia Pen at [facility] in September  Referring physician: Debbra Riding, MD [place],  [place] [id number]  HISTORICAL INFORMATION:   Selected notes from the MEDICAL RECORD NUMBER Referred by Dr. Katy Fitch for RP LEE:  Ocular Hx- PMH-    CURRENT MEDICATIONS: No current outpatient medications on file. (Ophthalmic Drugs)   No current facility-administered medications for this visit. (Ophthalmic Drugs)   Current Outpatient Medications (Other)  Medication Sig   Alpha-Lipoic Acid 100 MG CAPS Take 100 mg by mouth daily.    AMBULATORY NON FORMULARY MEDICATION 2 capsules in the morning and at bedtime. Medication Name: Arthroflex   AMBULATORY NON FORMULARY MEDICATION 2 capsules in the morning  and at bedtime. Medication Name: ART supplement   AMBULATORY NON FORMULARY MEDICATION 2 capsules daily. Medication Name: Martyn Ehrich COMPLEX   Cholecalciferol (VITAMIN D3) 3000 units TABS Take 3,000 Units by mouth daily.    cyanocobalamin 1000 MCG tablet Take by mouth.   magnesium 30 MG tablet Take 30 mg by mouth daily.   No current facility-administered medications for this visit. (Other)   REVIEW OF SYSTEMS: ROS   Positive for: Neurological, Musculoskeletal, Eyes Negative for: Constitutional, Gastrointestinal, Skin, Genitourinary, HENT, Endocrine, Cardiovascular, Respiratory, Psychiatric, Allergic/Imm, Heme/Lymph Last edited by Matthew Folks, COA on 03/06/2021  9:49 AM.     ALLERGIES Allergies  Allergen Reactions   Codeine Shortness Of Breath   Hydromet [Hydrocodone Bit-Homatrop Mbr] Shortness Of Breath and Other (See Comments)    Wheezing   Fish Allergy Other (See Comments)    Burning Sensation and Headache   Morphine Other (See Comments)    "feels funny"   Sulfa Antibiotics Nausea And Vomiting         PAST MEDICAL HISTORY Past Medical History:  Diagnosis Date   ANEMIA-NOS    ANXIETY    Arthritis    Cancer ([place]) 2005   colon cancer   Chronic back pain    spondylolisthesis   History of blood transfusion    no abnormal reaction  noted   History of shingles    HYPERLIPIDEMIA    takes Pravastatin daily   Legally blind    Nocturia    RETINITIS PIGMENTOSA    SYNDROME, CARPAL TUNNEL    Weakness    numbness and tingling in legs and feet   Past Surgical History:  Procedure Laterality Date   ABDOMINAL HYSTERECTOMY  1989   ANTERIOR LAT LUMBAR FUSION Right 01/06/2018   Procedure: Right Lumbar Two-Three Anterolateral lumbar interbody fusion with lateral plate;  Surgeon: Erline Levine, MD;  Location: [place];  Service: Neurosurgery;  Laterality: Right;  Right Lumbar Two-Three Anterolateral lumbar interbody fusion with lateral plate   APPENDECTOMY  1946   carapl tunnel  release Left    cataract surgery Bilateral    COLONOSCOPY     MAXIMUM ACCESS (MAS)POSTERIOR LUMBAR INTERBODY FUSION (PLIF) 2 LEVEL N/A 11/14/2015   Procedure: Lumbar Four-Five Maximum access posterior lumbar interbody fusion, Lumbar Three-Four Lumbar Four-Five Posterolateral Fusion and Pedicle Screws;  Surgeon: Erline Levine, MD;  Location: [facility];  Service: Neurosurgery;  Laterality: N/A;  L3-4 L4-5 Maximum access posterior lumbar interbody fusion   OOPHORECTOMY  1989   s/p ganglion cyst  1973   s/p right hemicolectomy  12 yrs ago    FAMILY HISTORY Family History  Problem Relation Age of Onset   ALS Cousin    Polymyalgia rheumatica Cousin    Breast cancer Maternal Aunt    Colon cancer Neg Hx     SOCIAL HISTORY Social History   Tobacco Use   Smoking status: Never   Smokeless tobacco: Never  Vaping Use   Vaping Use: Never used  Substance Use Topics   Alcohol use: No    Alcohol/week: 0.0 standard drinks   Drug use: No         OPHTHALMIC EXAM:  Base Eye Exam     Visual Acuity (Snellen - Linear)       Right Left   Dist [place] HM HM   Dist ph [place] NI NI         Tonometry (Tonopen, 9:52 AM)       Right Left   Pressure 8 14         Pupils       Dark Light Shape React APD   Right 4 3 Round Minimal None   Left 4 3 Round Minimal None         Visual Fields (Counting fingers)       Left Right   Restrictions Total superior temporal, inferior temporal, superior nasal, inferior nasal deficiencies Total superior temporal, inferior temporal, superior nasal, inferior nasal deficiencies         Extraocular Movement   Hard to assess due to pt's extremely poor VA OU        Neuro/Psych     Oriented x3: Yes   Mood/Affect: Normal         Dilation     Both eyes: 1.0% Mydriacyl, 2.5% Phenylephrine @ 9:52 AM           Slit Lamp and Fundus Exam     Slit Lamp Exam       Right Left   Lids/Lashes Dermatochalasis - upper lid Dermatochalasis - upper  lid   Conjunctiva/Sclera White and quiet White and quiet   Cornea arcus, well healed cataract wound arcus, well healed cataract wound   Anterior Chamber Deep and quiet Deep and quiet   Iris Round and dilated Round and dilated   Lens  3 piece PC IOL in good position, open PC 3 piece PC IOL in good position   Vitreous Vitreous syneresis Vitreous syneresis         Fundus Exam       Right Left   Disc waxy pallor, Sharp rim waxy pallor, Sharp rim   C/D Ratio 0.5 0.5   Macula Flat, Blunted foveal reflex, Atrophy, Pigment clumping Flat, Blunted foveal reflex, Atrophy, Pigment clumping   Vessels severe attenuation, Sheathing, sclerosis severe attenuation, Sheathing, sclerosis   Periphery Attached, 360 pigment deposition and +bone spicules    Attached, 360 pigment deposition and +bone spicules              Refraction     Manifest Refraction       Sphere Cylinder Dist VA   Right [place] Sphere 20/HM   Left [place] Sphere 20/HM            IMAGING AND PROCEDURES  Imaging and Procedures for 03/06/2021  OCT, Retina - OU - Both Eyes       Right Eye Quality was good. Central Foveal Thickness: 193. Progression has no prior data. Findings include normal foveal contour, no IRF, no SRF, retinal drusen (No ellipsoid signal).   Left Eye Quality was good. Central Foveal Thickness: 174. Progression has no prior data. Findings include normal foveal contour, no IRF, no SRF, epiretinal membrane (Mild ERM, No ellipsoid signal).   Notes *Images captured and stored on drive  Diagnosis / Impression:  NFP, no IRF/SRF OU No ellipsoid signal OU  Clinical management:  See below  Abbreviations: NFP - Normal foveal profile. CME - cystoid macular edema. PED - pigment epithelial detachment. IRF - intraretinal fluid. SRF - subretinal fluid. EZ - ellipsoid zone. ERM - epiretinal membrane. ORA - outer retinal atrophy. ORT - outer retinal tubulation. SRHM - subretinal hyper-reflective material. IRHM -  intraretinal hyper-reflective material              ASSESSMENT/PLAN:    ICD-10-CM   1. RETINITIS PIGMENTOSA  H35.52     2. Retinal edema  H35.81 OCT, Retina - OU - Both Eyes    3. Essential hypertension  I10     4. Hypertensive retinopathy of both eyes  H35.033     5. Pseudophakia, both eyes  Z96.1       1,2. Retinitis Pigmentosa OU - Previously followed by Dr. 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 Asa Lente and Dr. Jule Ser within last year - Vision was 20/300 OU in 2021 at [facility] - Younger brother also diagnosed with RP, no other fam members - BCVA today HM OU - OCT shows no ellipsoid signal OU - discussed no known treatments currently available for her inherited retinal disease - recommend re-establishment or f/u with Dr[facility] Lee[facility] at [facility] - f/u here prn  3,4. Hypertensive retinopathy OU - discussed importance of tight BP control - monitor  5. Pseudophakia OU  - s/p CE/IOL OU  - IOL in good position, doing well  - monitor  Ophthalmic Meds Ordered this visit:  No orders of the defined types were placed in this encounter.    Return if symptoms worsen or fail to improve.  There are no Patient Instructions on file for this visit.  Explained the diagnoses, plan, and follow up with the patient and they expressed understanding.  Patient expressed understanding of the importance of proper follow up care.   This document serves as a record of services personally performed by Gardiner Sleeper, MD, PhD. It was created on their behalf by Leonie Douglas, an ophthalmic technician. The creation of this record is the provider's dictation and/or activities during the visit.    Electronically signed by: Leonie Douglas  COA, 03/08/21  3:24 AM   Gardiner Sleeper, M.D., Ph.D. Diseases & Surgery of the Retina and [place] 03/06/2021  I have reviewed the above documentation for accuracy and completeness, and I agree with the above. Gardiner Sleeper, M.D., Ph.D. 03/08/21 3:24 AM   Abbreviations: M myopia (nearsighted); A astigmatism; H hyperopia (farsighted); P presbyopia; Mrx spectacle prescription;  CTL contact lenses; OD right eye; OS left eye; OU both eyes  XT exotropia; ET esotropia; PEK punctate epithelial keratitis; PEE punctate epithelial erosions; DES dry eye syndrome; MGD meibomian gland dysfunction; ATs artificial tears; PFAT's preservative free artificial tears; [place] nuclear sclerotic cataract; PSC posterior subcapsular cataract; ERM epi-retinal membrane; PVD posterior vitreous detachment; RD retinal detachment; DM diabetes mellitus; DR diabetic retinopathy; NPDR non-proliferative diabetic retinopathy; PDR proliferative diabetic retinopathy; CSME clinically significant macular edema; DME diabetic macular edema; dbh dot blot hemorrhages; CWS cotton wool spot; POAG primary open angle glaucoma; C/D cup-to-disc ratio; HVF humphrey visual field; GVF goldmann visual field; OCT optical coherence tomography; IOP intraocular pressure; BRVO Branch retinal vein occlusion; CRVO central retinal vein occlusion; CRAO central retinal artery occlusion; BRAO branch retinal artery occlusion; RT retinal tear; SB scleral buckle; PPV pars plana vitrectomy; VH Vitreous hemorrhage; PRP panretinal laser photocoagulation; IVK intravitreal kenalog; VMT vitreomacular traction; MH Macular hole;  NVD neovascularization of the disc; NVE neovascularization elsewhere; AREDS age related eye disease study; ARMD age related macular degeneration; POAG primary open angle glaucoma; EBMD epithelial/anterior basement membrane dystrophy; ACIOL anterior chamber intraocular lens; IOL intraocular lens; PCIOL posterior chamber  intraocular lens; Phaco/IOL phacoemulsification with intraocular lens placement; [place] photorefractive keratectomy; LASIK laser assisted in situ keratomileusis; HTN hypertension; DM diabetes mellitus; COPD chronic obstructive pulmonary disease

## 2021-03-06 ENCOUNTER — Ambulatory Visit (INDEPENDENT_AMBULATORY_CARE_PROVIDER_SITE_OTHER): Payer: PPO | Admitting: Ophthalmology

## 2021-03-06 ENCOUNTER — Encounter (INDEPENDENT_AMBULATORY_CARE_PROVIDER_SITE_OTHER): Payer: Self-pay | Admitting: Ophthalmology

## 2021-03-06 ENCOUNTER — Other Ambulatory Visit: Payer: Self-pay

## 2021-03-06 DIAGNOSIS — H35033 Hypertensive retinopathy, bilateral: Secondary | ICD-10-CM

## 2021-03-06 DIAGNOSIS — I10 Essential (primary) hypertension: Secondary | ICD-10-CM | POA: Diagnosis not present

## 2021-03-06 DIAGNOSIS — H3552 Pigmentary retinal dystrophy: Secondary | ICD-10-CM

## 2021-03-06 DIAGNOSIS — Z961 Presence of intraocular lens: Secondary | ICD-10-CM | POA: Diagnosis not present

## 2021-03-06 DIAGNOSIS — H3581 Retinal edema: Secondary | ICD-10-CM

## 2021-03-07 DIAGNOSIS — H548 Legal blindness, as defined in USA: Secondary | ICD-10-CM | POA: Diagnosis not present

## 2021-03-07 DIAGNOSIS — E1139 Type 2 diabetes mellitus with other diabetic ophthalmic complication: Secondary | ICD-10-CM | POA: Diagnosis not present

## 2021-03-07 DIAGNOSIS — M5441 Lumbago with sciatica, right side: Secondary | ICD-10-CM | POA: Diagnosis not present

## 2021-03-07 DIAGNOSIS — Z981 Arthrodesis status: Secondary | ICD-10-CM | POA: Diagnosis not present

## 2021-03-07 DIAGNOSIS — M4726 Other spondylosis with radiculopathy, lumbar region: Secondary | ICD-10-CM | POA: Diagnosis not present

## 2021-03-07 DIAGNOSIS — G8929 Other chronic pain: Secondary | ICD-10-CM | POA: Diagnosis not present

## 2021-03-07 DIAGNOSIS — M1611 Unilateral primary osteoarthritis, right hip: Secondary | ICD-10-CM | POA: Diagnosis not present

## 2021-03-07 DIAGNOSIS — M5135 Other intervertebral disc degeneration, thoracolumbar region: Secondary | ICD-10-CM | POA: Diagnosis not present

## 2021-03-08 ENCOUNTER — Encounter (INDEPENDENT_AMBULATORY_CARE_PROVIDER_SITE_OTHER): Payer: Self-pay | Admitting: Ophthalmology

## 2021-03-13 DIAGNOSIS — Z981 Arthrodesis status: Secondary | ICD-10-CM | POA: Diagnosis not present

## 2021-03-13 DIAGNOSIS — M5441 Lumbago with sciatica, right side: Secondary | ICD-10-CM | POA: Diagnosis not present

## 2021-03-13 DIAGNOSIS — E1139 Type 2 diabetes mellitus with other diabetic ophthalmic complication: Secondary | ICD-10-CM | POA: Diagnosis not present

## 2021-03-13 DIAGNOSIS — M1611 Unilateral primary osteoarthritis, right hip: Secondary | ICD-10-CM | POA: Diagnosis not present

## 2021-03-13 DIAGNOSIS — H548 Legal blindness, as defined in USA: Secondary | ICD-10-CM | POA: Diagnosis not present

## 2021-03-13 DIAGNOSIS — M4726 Other spondylosis with radiculopathy, lumbar region: Secondary | ICD-10-CM | POA: Diagnosis not present

## 2021-03-13 DIAGNOSIS — G8929 Other chronic pain: Secondary | ICD-10-CM | POA: Diagnosis not present

## 2021-03-13 DIAGNOSIS — M5135 Other intervertebral disc degeneration, thoracolumbar region: Secondary | ICD-10-CM | POA: Diagnosis not present

## 2021-03-25 DIAGNOSIS — M545 Low back pain, unspecified: Secondary | ICD-10-CM | POA: Diagnosis not present

## 2021-03-25 DIAGNOSIS — M5441 Lumbago with sciatica, right side: Secondary | ICD-10-CM | POA: Diagnosis not present

## 2021-03-25 DIAGNOSIS — R030 Elevated blood-pressure reading, without diagnosis of hypertension: Secondary | ICD-10-CM | POA: Diagnosis not present

## 2021-04-03 ENCOUNTER — Encounter (HOSPITAL_BASED_OUTPATIENT_CLINIC_OR_DEPARTMENT_OTHER): Payer: Self-pay | Admitting: *Deleted

## 2021-04-03 ENCOUNTER — Emergency Department (HOSPITAL_BASED_OUTPATIENT_CLINIC_OR_DEPARTMENT_OTHER)
Admission: EM | Admit: 2021-04-03 | Discharge: 2021-04-03 | Disposition: A | Discharge status: Home / Self Care | Payer: PPO | Attending: Emergency Medicine | Admitting: Emergency Medicine

## 2021-04-03 ENCOUNTER — Emergency Department (HOSPITAL_BASED_OUTPATIENT_CLINIC_OR_DEPARTMENT_OTHER): Payer: PPO

## 2021-04-03 ENCOUNTER — Other Ambulatory Visit: Payer: Self-pay

## 2021-04-03 DIAGNOSIS — R531 Weakness: Secondary | ICD-10-CM | POA: Diagnosis not present

## 2021-04-03 DIAGNOSIS — K5730 Diverticulosis of large intestine without perforation or abscess without bleeding: Secondary | ICD-10-CM | POA: Insufficient documentation

## 2021-04-03 DIAGNOSIS — I10 Essential (primary) hypertension: Secondary | ICD-10-CM | POA: Insufficient documentation

## 2021-04-03 DIAGNOSIS — R112 Nausea with vomiting, unspecified: Secondary | ICD-10-CM

## 2021-04-03 DIAGNOSIS — Z20822 Contact with and (suspected) exposure to covid-19: Secondary | ICD-10-CM | POA: Insufficient documentation

## 2021-04-03 DIAGNOSIS — Z85038 Personal history of other malignant neoplasm of large intestine: Secondary | ICD-10-CM | POA: Insufficient documentation

## 2021-04-03 DIAGNOSIS — R140 Abdominal distension (gaseous): Secondary | ICD-10-CM | POA: Diagnosis not present

## 2021-04-03 DIAGNOSIS — N3000 Acute cystitis without hematuria: Secondary | ICD-10-CM | POA: Diagnosis not present

## 2021-04-03 DIAGNOSIS — R109 Unspecified abdominal pain: Secondary | ICD-10-CM | POA: Diagnosis not present

## 2021-04-03 LAB — CBC
HCT: 41.4 % (ref 36.0–46.0)
Hemoglobin: 14 g/dL (ref 12.0–15.0)
MCH: 29.6 pg (ref 26.0–34.0)
MCHC: 33.8 g/dL (ref 30.0–36.0)
MCV: 87.5 fL (ref 80.0–100.0)
Platelets: 179 10*3/uL (ref 150–400)
RBC: 4.73 MIL/uL (ref 3.87–5.11)
RDW: 13.6 % (ref 11.5–15.5)
WBC: 6.9 10*3/uL (ref 4.0–10.5)
nRBC: 0 % (ref 0.0–0.2)

## 2021-04-03 LAB — COMPREHENSIVE METABOLIC PANEL
ALT: 20 U/L (ref 0–44)
AST: 18 U/L (ref 15–41)
Albumin: 5 g/dL (ref 3.5–5.0)
Alkaline Phosphatase: 49 U/L (ref 38–126)
Anion gap: 11 (ref 5–15)
BUN: 14 mg/dL (ref 8–23)
CO2: 32 mmol/L (ref 22–32)
Calcium: 10.8 mg/dL — ABNORMAL HIGH (ref 8.9–10.3)
Chloride: 98 mmol/L (ref 98–111)
Creatinine, Ser: 0.62 mg/dL (ref 0.44–1.00)
GFR, Estimated: >60 mL/min (ref >60)
Glucose, Bld: 107 mg/dL — ABNORMAL HIGH (ref 70–99)
Potassium: 3.3 mmol/L — ABNORMAL LOW (ref 3.5–5.1)
Sodium: 141 mmol/L (ref 135–145)
Total Bilirubin: 0.7 mg/dL (ref 0.3–1.2)
Total Protein: 7.8 g/dL (ref 6.5–8.1)

## 2021-04-03 LAB — URINALYSIS, ROUTINE W REFLEX MICROSCOPIC
Bilirubin Urine: NEGATIVE
Glucose, UA: NEGATIVE mg/dL
Ketones, ur: NEGATIVE mg/dL
Nitrite: NEGATIVE
Protein, ur: 30 mg/dL — AB
Specific Gravity, Urine: 1.042 — ABNORMAL HIGH (ref 1.005–1.030)
pH: 6 (ref 5.0–8.0)

## 2021-04-03 LAB — LIPASE, BLOOD: Lipase: 54 U/L — ABNORMAL HIGH (ref 11–51)

## 2021-04-03 LAB — RESP PANEL BY RT-PCR (FLU A&B, COVID) ARPGX2
Influenza A by PCR: NEGATIVE
Influenza B by PCR: NEGATIVE
SARS Coronavirus 2 by RT PCR: NEGATIVE

## 2021-04-03 MED ORDER — SODIUM CHLORIDE 0.9 % IV BOLUS
1000.0000 mL | Freq: Once | INTRAVENOUS | Status: AC
  Administered 2021-04-03: 1000 mL via INTRAVENOUS

## 2021-04-03 MED ORDER — ONDANSETRON HCL 4 MG/2ML IJ SOLN
4.0000 mg | Freq: Once | INTRAMUSCULAR | Status: AC | PRN
  Administered 2021-04-03: 4 mg via INTRAVENOUS
  Filled 2021-04-03: qty 2

## 2021-04-03 MED ORDER — LORATADINE 10 MG PO TABS: 10.0000 mg | ORAL_TABLET | Freq: Every day | ORAL | 0 refills | Status: DC

## 2021-04-03 MED ORDER — IOHEXOL 300 MG/ML  SOLN
100.0000 mL | Freq: Once | INTRAMUSCULAR | Status: AC | PRN
  Administered 2021-04-03: 100 mL via INTRAVENOUS

## 2021-04-03 MED ORDER — MECLIZINE HCL 25 MG PO TABS
25.0000 mg | ORAL_TABLET | Freq: Once | ORAL | Status: AC
  Administered 2021-04-03: 25 mg via ORAL
  Filled 2021-04-03: qty 1

## 2021-04-03 MED ORDER — CEPHALEXIN 500 MG PO CAPS: 500.0000 mg | ORAL_CAPSULE | Freq: Four times a day (QID) | ORAL | 0 refills | Status: DC

## 2021-04-03 MED ORDER — ONDANSETRON 4 MG PO TBDP: 4.0000 mg | ORAL_TABLET | Freq: Three times a day (TID) | ORAL | 0 refills | Status: DC | PRN

## 2021-04-03 MED ORDER — CEPHALEXIN 250 MG PO CAPS
500.0000 mg | ORAL_CAPSULE | Freq: Once | ORAL | Status: AC
  Administered 2021-04-03: 500 mg via ORAL
  Filled 2021-04-03: qty 2

## 2021-04-03 MED ORDER — ONDANSETRON 4 MG PO TBDP
4.0000 mg | ORAL_TABLET | Freq: Once | ORAL | Status: AC
  Administered 2021-04-03: 4 mg via ORAL
  Filled 2021-04-03: qty 1

## 2021-04-03 NOTE — ED Notes (Signed)
Pt is able to continue to sip on her Ginger-ale and able to eat some crackers.

## 2021-04-03 NOTE — ED Notes (Signed)
Pt going to CT

## 2021-04-03 NOTE — ED Notes (Signed)
Pt was offered ginger-ale and was able to take some small sips. Pt was able to tolerate soda well.

## 2021-04-03 NOTE — Discharge Instructions (Addendum)
Please read and follow all provided instructions.  Your diagnoses today include:  1. Nausea and vomiting, unspecified vomiting type   2. Generalized weakness   3. Acute cystitis without hematuria     Tests performed today include: Blood cell counts and platelets: Were normal Kidney and liver function tests: Were normal Pancreas function test (called lipase): Slightly high Urine test to look for infection: Shows signs of infection CT scan of the abdomen and pelvis: Does not show any concerning findings today Vital signs. See below for your results today.   Medications prescribed:  Zofran (ondansetron) - for nausea and vomiting  Keflex (cephalexin) - antibiotic  You have been prescribed an antibiotic medicine: take the entire course of medicine even if you are feeling better. Stopping early can cause the antibiotic not to work.  Loratadine - antihistamine to help lower mucus production take any prescribed medications only as directed.  Home care instructions:  Follow any educational materials contained in this packet.  Follow-up instructions: Please follow-up with your primary care provider in the next 3 days for further evaluation of your symptoms and to discuss home health needs.  Return instructions:  SEEK IMMEDIATE MEDICAL ATTENTION IF: The pain does not go away or becomes severe  A temperature above 101F develops  Repeated vomiting occurs (multiple episodes)  The pain becomes localized to portions of the abdomen. The right side could possibly be appendicitis. In an adult, the left lower portion of the abdomen could be colitis or diverticulitis.  Blood is being passed in stools or vomit (bright red or black tarry stools)  You develop chest pain, difficulty breathing, dizziness or fainting, or become confused, poorly responsive, or inconsolable (young children) If you have any other emergent concerns regarding your health  Additional Information: Abdominal (belly) pain can  be caused by many things. Your caregiver performed an examination and possibly ordered blood/urine tests and imaging (CT scan, x-rays, ultrasound). Many cases can be observed and treated at home after initial evaluation in the emergency department. Even though you are being discharged home, abdominal pain can be unpredictable. Therefore, you need a repeated exam if your pain does not resolve, returns, or worsens. Most patients with abdominal pain don't have to be admitted to the hospital or have surgery, but serious problems like appendicitis and gallbladder attacks can start out as nonspecific pain. Many abdominal conditions cannot be diagnosed in one visit, so follow-up evaluations are very important.  Your vital signs today were: BP (!) 148/78 (BP Location: Right Arm)   Pulse 90   Temp 98.4 F (36.9 C)   Resp 14   Ht 5\' 1"  (1.549 m)   Wt 79.4 kg   SpO2 93%   BMI 33.07 kg/m  If your blood pressure (bp) was elevated above 135/85 this visit, please have this repeated by your doctor within one month. --------------

## 2021-04-03 NOTE — ED Notes (Signed)
Pure wick has been placed. Suction set to 45mmHg.  

## 2021-04-03 NOTE — ED Triage Notes (Signed)
Vomiting since beginning of week, was a little better then started last night with vomiting and weakness. Marland Kitchen

## 2021-04-03 NOTE — ED Provider Notes (Signed)
[facility] EMERGENCY DEPT Provider Note   CSN: [id number] Arrival date & time: 04/03/21  1051     History Chief Complaint  Patient presents with   Emesis    Kathryn Romero is a 85 y.o. female.  Patient with history of colon cancer status post bowel resection presents to the emergency department for evaluation of nausea, vomiting, abdominal distention.  Symptoms started earlier in the week.  She had 2 days of intermittent vomiting.  Then symptoms had improved a bit but then recurred last night and overnight.  Patient was very weak and could not get out of a chair all night.  Vomiting, nonbloody nonbilious.  No diarrhea.  No fevers.  She feels as though her abdomen is distended.  She has had a cough.  No other URI symptoms.  The onset of this condition was acute. Aggravating factors: none. Alleviating factors: none.        Past Medical History:  Diagnosis Date   ANEMIA-NOS    ANXIETY    Arthritis    Cancer ([place]) 2005   colon cancer   Chronic back pain    spondylolisthesis   History of blood transfusion    no abnormal reaction noted   History of shingles    HYPERLIPIDEMIA    takes Pravastatin daily   Legally blind    Nocturia    RETINITIS PIGMENTOSA    SYNDROME, CARPAL TUNNEL    Weakness    numbness and tingling in legs and feet    Patient Active Problem List   Diagnosis Date Noted   Degenerative lumbar spinal stenosis 01/06/2018   Spondylolisthesis at L4-L5 level 11/14/2015   DOE (dyspnea on exertion) 10/14/2014   Fatigue 09/19/2014   Obese 05/20/2014   RETINITIS PIGMENTOSA 12/04/2008   GLUCOSE INTOLERANCE 05/09/2007   Dyslipidemia 05/09/2007   ANEMIA-NOS 05/09/2007   ANXIETY 05/09/2007   Essential hypertension 05/09/2007   DIVERTICULOSIS, COLON 05/09/2007   BACK PAIN 05/09/2007   History of malignant neoplasm of large intestine 05/09/2007   ARTHRITIS, TRAUMATIC, UNSPECIFIED SITE 12/17/2006    Past Surgical History:  Procedure  Laterality Date   ABDOMINAL HYSTERECTOMY  1989   ANTERIOR LAT LUMBAR FUSION Right 01/06/2018   Procedure: Right Lumbar Two-Three Anterolateral lumbar interbody fusion with lateral plate;  Surgeon: Erline Levine, MD;  Location: [facility];  Service: Neurosurgery;  Laterality: Right;  Right Lumbar Two-Three Anterolateral lumbar interbody fusion with lateral plate   APPENDECTOMY  1946   carapl tunnel release Left    cataract surgery Bilateral    COLONOSCOPY     MAXIMUM ACCESS (MAS)POSTERIOR LUMBAR INTERBODY FUSION (PLIF) 2 LEVEL N/A 11/14/2015   Procedure: Lumbar Four-Five Maximum access posterior lumbar interbody fusion, Lumbar Three-Four Lumbar Four-Five Posterolateral Fusion and Pedicle Screws;  Surgeon: Erline Levine, MD;  Location: [facility];  Service: Neurosurgery;  Laterality: N/A;  L3-4 L4-5 Maximum access posterior lumbar interbody fusion   OOPHORECTOMY  1989   s/p ganglion cyst  1973   s/p right hemicolectomy  12 yrs ago     OB History   No obstetric history on file.     Family History  Problem Relation Age of Onset   ALS Cousin    Polymyalgia rheumatica Cousin    Breast cancer Maternal Aunt    Colon cancer Neg Hx     Social History   Tobacco Use   Smoking status: Never   Smokeless tobacco: Never  Vaping Use   Vaping Use: Never used  Substance Use Topics  Alcohol use: No    Alcohol/week: 0.0 standard drinks   Drug use: No    Home Medications Prior to Admission medications   Medication Sig Start Date End Date Taking? Authorizing Provider  Alpha-Lipoic Acid 100 MG CAPS Take 100 mg by mouth daily.     [provider]  AMBULATORY NON FORMULARY MEDICATION 2 capsules in the morning and at bedtime. Medication Name: Arthroflex    [provider]  AMBULATORY NON FORMULARY MEDICATION 2 capsules in the morning and at bedtime. Medication Name: ART supplement    [provider]  AMBULATORY NON FORMULARY MEDICATION 2 capsules daily. Medication Name:  [facility] COMPLEX    [provider]  Cholecalciferol (VITAMIN D3) 3000 units TABS Take 3,000 Units by mouth daily.     [provider]  cyanocobalamin 1000 MCG tablet Take by mouth.    [provider]  magnesium 30 MG tablet Take 30 mg by mouth daily.    [provider]    Allergies    Codeine, Hydromet [hydrocodone bit-homatrop mbr], Fish allergy, Morphine, and Sulfa antibiotics  Review of Systems   Review of Systems  Constitutional:  Negative for fever.  HENT:  Negative for rhinorrhea and sore throat.   Eyes:  Negative for redness.  Respiratory:  Positive for cough. Negative for shortness of breath.   Cardiovascular:  Negative for chest pain.  Gastrointestinal:  Positive for abdominal pain, nausea and vomiting. Negative for blood in stool, constipation and diarrhea.  Genitourinary:  Negative for dysuria, frequency, hematuria and urgency.  Musculoskeletal:  Negative for myalgias.  Skin:  Negative for rash.  Neurological:  Negative for headaches.   Physical Exam Updated Vital Signs BP (!) 161/64 (BP Location: Left Arm)   Pulse 74   Temp 98.4 F (36.9 C)   Resp 15   Ht 5\' 1"  (1.549 m)   Wt 79.4 kg   SpO2 97%   BMI 33.07 kg/m   Physical Exam Vitals and nursing note reviewed.  Constitutional:      General: She is not in acute distress.    Appearance: She is well-developed.  HENT:     Head: Normocephalic and atraumatic.     Right Ear: External ear normal.     Left Ear: External ear normal.     Nose: Nose normal.  Eyes:     Conjunctiva/sclera: Conjunctivae normal.  Cardiovascular:     Rate and Rhythm: Normal rate and regular rhythm.     Heart sounds: No murmur heard. Pulmonary:     Effort: No respiratory distress.     Breath sounds: No wheezing, rhonchi or rales.  Abdominal:     General: There is distension (mild).     Palpations: Abdomen is soft. There is no mass.     Tenderness: There is abdominal tenderness (Epigastric,  periumbilical, mild). There is no guarding or rebound.  Musculoskeletal:     Cervical back: Normal range of motion and neck supple.     Right lower leg: No edema.     Left lower leg: No edema.  Skin:    General: Skin is warm and dry.     Findings: No rash.  Neurological:     General: No focal deficit present.     Mental Status: She is alert. Mental status is at baseline.     Motor: No weakness.  Psychiatric:        Mood and Affect: Mood normal.    ED Results / Procedures / Treatments  Labs (all labs ordered are listed, but only abnormal results are displayed) Labs Reviewed  LIPASE, BLOOD - Abnormal; Notable for the following components:      Result Value   Lipase 54 (*)    All other components within normal limits  COMPREHENSIVE METABOLIC PANEL - Abnormal; Notable for the following components:   Potassium 3.3 (*)    Glucose, Bld 107 (*)    Calcium 10.8 (*)    All other components within normal limits  RESP PANEL BY RT-PCR (FLU A&B, COVID) ARPGX2  CBC  URINALYSIS, ROUTINE W REFLEX MICROSCOPIC    EKG None  Radiology CT ABDOMEN PELVIS W CONTRAST  Result Date: 04/03/2021 CLINICAL DATA:  Abdominal distension and abdominal pain, bowel obstruction suspected. EXAM: CT ABDOMEN AND PELVIS WITH CONTRAST TECHNIQUE: Multidetector CT imaging of the abdomen and pelvis was performed using the standard protocol following bolus administration of intravenous contrast. CONTRAST:  189mL OMNIPAQUE IOHEXOL 300 MG/ML  SOLN COMPARISON:  CT abdomen and pelvis 09/23/2017. FINDINGS: Lower chest: No acute abnormality. Hepatobiliary: No focal liver abnormality is seen. No gallstones, gallbladder wall thickening, or biliary dilatation. Pancreas: Unremarkable. No pancreatic ductal dilatation or surrounding inflammatory changes. Spleen: Normal in size without focal abnormality. Adrenals/Urinary Tract: There is a rounded cortical hypodensity in the left kidney which is too small to characterize, most likely  a cyst. Otherwise, the kidneys, adrenal glands and bladder are within normal limits. Stomach/Bowel: There has been right partial colectomy. No bowel obstruction or free air. The stomach and small bowel appear within normal limits. There is colonic diverticulosis without evidence for acute diverticulitis. Vascular/Lymphatic: Aortic atherosclerosis. No enlarged abdominal or pelvic lymph nodes. Reproductive: Status post hysterectomy. No adnexal masses. Other: There is a small fat containing umbilical hernia. There is a fat containing posterior right lumbar hernia, new from the prior exam. There is no ascites or free air. Musculoskeletal: L3-L5 posterior fusion hardware is present similar to the prior study. Multilevel degenerative changes affect the spine. There are degenerative changes of the bilateral hips, right greater than left. No acute fractures are seen. IMPRESSION: 1. No acute localizing process in the abdomen or pelvis. 2. Colonic diverticulosis without evidence for diverticulitis. 3. New fat containing right lumbar hernia. 4.  Aortic Atherosclerosis (ICD10-I70.0). Electronically Signed   By: Ronney Asters M.D.   On: 04/03/2021 15:59    Procedures Procedures   Medications Ordered in ED Medications  ondansetron (ZOFRAN-ODT) disintegrating tablet 4 mg (has no administration in time range)  ondansetron (ZOFRAN) injection 4 mg (4 mg Intravenous Given 04/03/21 1208)  sodium chloride 0.9 % bolus 1,000 mL ( Intravenous Stopped 04/03/21 1648)  iohexol (OMNIPAQUE) 300 MG/ML solution 100 mL (100 mLs Intravenous Contrast Given 04/03/21 1531)  meclizine (ANTIVERT) tablet 25 mg (25 mg Oral Given 04/03/21 1743)  cephALEXin (KEFLEX) capsule 500 mg (500 mg Oral Given 04/03/21 1743)    ED Course  I have reviewed the triage vital signs and the nursing notes.  Pertinent labs & imaging results that were available during my care of the patient were reviewed by me and considered in my medical decision making (see  chart for details).  Patient seen and examined. Plan discussed with patient.   Labs: Reviewed labs ordered in triage.  Minimal elevation of lipase.  Normal white blood cell count.  Will check flu test.  Awaiting UA.  Imaging: CT abdomen and pelvis ordered and pending.  Medications/Fluids: IV fluid bolus.  Vital signs reviewed and are as follows: BP (!) 161/64 (BP Location:  Left Arm)   Pulse 75   Temp 98.4 F (36.9 C)   Resp 15   Ht 5\' 1"  (1.549 m)   Wt 79.4 kg   SpO2 95%   BMI 33.07 kg/m   Patient rechecked several times.  CT scan was reassuring.  Updated patient and friend at bedside.  Awaiting urine test for UTI.  Urine does appear cloudy at bedside.  Patient is asking about something for mucus production.  Recommended nonsedating antihistamine.  6:15 PM UA with concerns for infection with 11-20 white blood cells and clean-catch.  Given dose of Keflex.  Patient has not had any further vomiting.  Patient and friend asked about home health.  I discussed that it is difficult to obtain this through the emergency department and this would be best done by her primary care doctor, and to fully assess her needs and services which could potentially be available.  She was given a dose of meclizine here for vague sense of dizziness.  Plan home with Keflex, Zofran, loratadine.  Patient urged to return with worsening symptoms or other concerns. Patient verbalized understanding and agrees with plan.     MDM Rules/Calculators/A&P                           N/V: Labs reassuring.  Minimally low potassium.  Normal white blood cell count.  CT of the abdomen pelvis ordered given history of resection to rule out small bowel obstruction and this was negative.  Patient with no further vomiting while in the emergency department.  Home with Zofran to use as needed.  Generalized weakness: Normal red blood cell count.  Negative flu/COVID.  Patient does have abnormal findings on UA which could  represent UTI.  Given her vomiting and weakness, feel it is reasonable to treat this.  Started on Keflex.  Final Clinical Impression(s) / ED Diagnoses Final diagnoses:  Nausea and vomiting, unspecified vomiting type  Generalized weakness  Acute cystitis without hematuria    Rx / DC Orders ED Discharge Orders          Ordered    cephALEXin (KEFLEX) 500 MG capsule  4 times daily        04/03/21 1802    ondansetron (ZOFRAN-ODT) 4 MG disintegrating tablet  Every 8 hours PRN        04/03/21 1802    loratadine (CLARITIN) 10 MG tablet  Daily        04/03/21 1802             Carlisle Cater, PA-C 04/03/21 1818    Horton, Alvin Critchley, DO 04/03/21 2356

## 2021-04-03 NOTE — ED Notes (Signed)
Patient transported to CT 

## 2021-04-08 DIAGNOSIS — Z7689 Persons encountering health services in other specified circumstances: Secondary | ICD-10-CM | POA: Diagnosis not present

## 2021-04-08 DIAGNOSIS — N390 Urinary tract infection, site not specified: Secondary | ICD-10-CM | POA: Diagnosis not present

## 2021-04-08 DIAGNOSIS — Z66 Do not resuscitate: Secondary | ICD-10-CM | POA: Diagnosis not present

## 2021-04-08 DIAGNOSIS — N3281 Overactive bladder: Secondary | ICD-10-CM | POA: Diagnosis not present

## 2021-04-08 DIAGNOSIS — F419 Anxiety disorder, unspecified: Secondary | ICD-10-CM | POA: Diagnosis not present

## 2021-04-08 DIAGNOSIS — R112 Nausea with vomiting, unspecified: Secondary | ICD-10-CM | POA: Diagnosis not present

## 2021-04-08 DIAGNOSIS — E785 Hyperlipidemia, unspecified: Secondary | ICD-10-CM | POA: Diagnosis not present

## 2021-04-08 DIAGNOSIS — G629 Polyneuropathy, unspecified: Secondary | ICD-10-CM | POA: Diagnosis not present

## 2021-04-08 DIAGNOSIS — M48061 Spinal stenosis, lumbar region without neurogenic claudication: Secondary | ICD-10-CM | POA: Diagnosis not present

## 2021-04-08 DIAGNOSIS — I10 Essential (primary) hypertension: Secondary | ICD-10-CM | POA: Diagnosis not present

## 2021-04-08 DIAGNOSIS — H8110 Benign paroxysmal vertigo, unspecified ear: Secondary | ICD-10-CM | POA: Diagnosis not present

## 2021-04-08 DIAGNOSIS — R5381 Other malaise: Secondary | ICD-10-CM | POA: Diagnosis not present

## 2021-04-14 DIAGNOSIS — M1611 Unilateral primary osteoarthritis, right hip: Secondary | ICD-10-CM | POA: Diagnosis not present

## 2021-04-14 DIAGNOSIS — G8929 Other chronic pain: Secondary | ICD-10-CM | POA: Diagnosis not present

## 2021-04-14 DIAGNOSIS — M4726 Other spondylosis with radiculopathy, lumbar region: Secondary | ICD-10-CM | POA: Diagnosis not present

## 2021-04-14 DIAGNOSIS — H548 Legal blindness, as defined in USA: Secondary | ICD-10-CM | POA: Diagnosis not present

## 2021-04-14 DIAGNOSIS — E1139 Type 2 diabetes mellitus with other diabetic ophthalmic complication: Secondary | ICD-10-CM | POA: Diagnosis not present

## 2021-04-14 DIAGNOSIS — M5441 Lumbago with sciatica, right side: Secondary | ICD-10-CM | POA: Diagnosis not present

## 2021-04-14 DIAGNOSIS — M5135 Other intervertebral disc degeneration, thoracolumbar region: Secondary | ICD-10-CM | POA: Diagnosis not present

## 2021-04-14 DIAGNOSIS — Z981 Arthrodesis status: Secondary | ICD-10-CM | POA: Diagnosis not present

## 2021-04-20 DIAGNOSIS — I10 Essential (primary) hypertension: Secondary | ICD-10-CM | POA: Diagnosis not present

## 2021-04-20 DIAGNOSIS — N390 Urinary tract infection, site not specified: Secondary | ICD-10-CM | POA: Diagnosis not present

## 2021-05-05 DIAGNOSIS — M5135 Other intervertebral disc degeneration, thoracolumbar region: Secondary | ICD-10-CM | POA: Diagnosis not present

## 2021-05-05 DIAGNOSIS — M4726 Other spondylosis with radiculopathy, lumbar region: Secondary | ICD-10-CM | POA: Diagnosis not present

## 2021-05-05 DIAGNOSIS — H548 Legal blindness, as defined in USA: Secondary | ICD-10-CM | POA: Diagnosis not present

## 2021-05-05 DIAGNOSIS — G8929 Other chronic pain: Secondary | ICD-10-CM | POA: Diagnosis not present

## 2021-05-05 DIAGNOSIS — E1139 Type 2 diabetes mellitus with other diabetic ophthalmic complication: Secondary | ICD-10-CM | POA: Diagnosis not present

## 2021-05-05 DIAGNOSIS — M5441 Lumbago with sciatica, right side: Secondary | ICD-10-CM | POA: Diagnosis not present

## 2021-05-05 DIAGNOSIS — Z981 Arthrodesis status: Secondary | ICD-10-CM | POA: Diagnosis not present

## 2021-05-05 DIAGNOSIS — M1611 Unilateral primary osteoarthritis, right hip: Secondary | ICD-10-CM | POA: Diagnosis not present

## 2021-06-02 DIAGNOSIS — M5441 Lumbago with sciatica, right side: Secondary | ICD-10-CM | POA: Diagnosis not present

## 2021-06-02 DIAGNOSIS — Z981 Arthrodesis status: Secondary | ICD-10-CM | POA: Diagnosis not present

## 2021-06-02 DIAGNOSIS — E1139 Type 2 diabetes mellitus with other diabetic ophthalmic complication: Secondary | ICD-10-CM | POA: Diagnosis not present

## 2021-06-02 DIAGNOSIS — M1611 Unilateral primary osteoarthritis, right hip: Secondary | ICD-10-CM | POA: Diagnosis not present

## 2021-06-02 DIAGNOSIS — G8929 Other chronic pain: Secondary | ICD-10-CM | POA: Diagnosis not present

## 2021-06-02 DIAGNOSIS — H548 Legal blindness, as defined in USA: Secondary | ICD-10-CM | POA: Diagnosis not present

## 2021-06-02 DIAGNOSIS — M5135 Other intervertebral disc degeneration, thoracolumbar region: Secondary | ICD-10-CM | POA: Diagnosis not present

## 2021-06-02 DIAGNOSIS — M4726 Other spondylosis with radiculopathy, lumbar region: Secondary | ICD-10-CM | POA: Diagnosis not present

## 2021-08-05 DIAGNOSIS — N390 Urinary tract infection, site not specified: Secondary | ICD-10-CM | POA: Diagnosis not present

## 2021-08-17 DIAGNOSIS — N390 Urinary tract infection, site not specified: Secondary | ICD-10-CM | POA: Diagnosis not present

## 2021-08-17 DIAGNOSIS — R5381 Other malaise: Secondary | ICD-10-CM | POA: Diagnosis not present

## 2021-08-17 DIAGNOSIS — I10 Essential (primary) hypertension: Secondary | ICD-10-CM | POA: Diagnosis not present

## 2021-08-17 DIAGNOSIS — H3552 Pigmentary retinal dystrophy: Secondary | ICD-10-CM | POA: Diagnosis not present

## 2021-08-17 DIAGNOSIS — Z66 Do not resuscitate: Secondary | ICD-10-CM | POA: Diagnosis not present

## 2021-08-17 DIAGNOSIS — H8110 Benign paroxysmal vertigo, unspecified ear: Secondary | ICD-10-CM | POA: Diagnosis not present

## 2021-08-17 DIAGNOSIS — R5383 Other fatigue: Secondary | ICD-10-CM | POA: Diagnosis not present

## 2021-08-17 DIAGNOSIS — M48061 Spinal stenosis, lumbar region without neurogenic claudication: Secondary | ICD-10-CM | POA: Diagnosis not present

## 2021-08-17 DIAGNOSIS — N3281 Overactive bladder: Secondary | ICD-10-CM | POA: Diagnosis not present

## 2021-08-17 DIAGNOSIS — G629 Polyneuropathy, unspecified: Secondary | ICD-10-CM | POA: Diagnosis not present

## 2021-08-17 DIAGNOSIS — E785 Hyperlipidemia, unspecified: Secondary | ICD-10-CM | POA: Diagnosis not present

## 2021-09-03 DIAGNOSIS — R7989 Other specified abnormal findings of blood chemistry: Secondary | ICD-10-CM | POA: Diagnosis not present

## 2021-09-07 DIAGNOSIS — G8929 Other chronic pain: Secondary | ICD-10-CM | POA: Diagnosis not present

## 2021-09-29 DIAGNOSIS — G8929 Other chronic pain: Secondary | ICD-10-CM | POA: Diagnosis not present

## 2021-09-29 DIAGNOSIS — M48061 Spinal stenosis, lumbar region without neurogenic claudication: Secondary | ICD-10-CM | POA: Diagnosis not present

## 2021-09-29 DIAGNOSIS — H548 Legal blindness, as defined in USA: Secondary | ICD-10-CM | POA: Diagnosis not present

## 2021-09-29 DIAGNOSIS — M1990 Unspecified osteoarthritis, unspecified site: Secondary | ICD-10-CM | POA: Diagnosis not present

## 2021-09-29 DIAGNOSIS — Z87440 Personal history of urinary (tract) infections: Secondary | ICD-10-CM | POA: Diagnosis not present

## 2021-09-29 DIAGNOSIS — E785 Hyperlipidemia, unspecified: Secondary | ICD-10-CM | POA: Diagnosis not present

## 2021-09-29 DIAGNOSIS — N3281 Overactive bladder: Secondary | ICD-10-CM | POA: Diagnosis not present

## 2021-09-29 DIAGNOSIS — G629 Polyneuropathy, unspecified: Secondary | ICD-10-CM | POA: Diagnosis not present

## 2021-09-29 DIAGNOSIS — F419 Anxiety disorder, unspecified: Secondary | ICD-10-CM | POA: Diagnosis not present

## 2021-09-29 DIAGNOSIS — I10 Essential (primary) hypertension: Secondary | ICD-10-CM | POA: Diagnosis not present

## 2021-09-29 DIAGNOSIS — H3552 Pigmentary retinal dystrophy: Secondary | ICD-10-CM | POA: Diagnosis not present

## 2021-09-29 DIAGNOSIS — Z6833 Body mass index (BMI) 33.0-33.9, adult: Secondary | ICD-10-CM | POA: Diagnosis not present

## 2021-09-29 DIAGNOSIS — Z9181 History of falling: Secondary | ICD-10-CM | POA: Diagnosis not present

## 2021-09-29 DIAGNOSIS — R5383 Other fatigue: Secondary | ICD-10-CM | POA: Diagnosis not present

## 2021-09-29 DIAGNOSIS — H8110 Benign paroxysmal vertigo, unspecified ear: Secondary | ICD-10-CM | POA: Diagnosis not present

## 2021-10-07 DIAGNOSIS — R42 Dizziness and giddiness: Secondary | ICD-10-CM | POA: Diagnosis not present

## 2021-10-07 DIAGNOSIS — M6281 Muscle weakness (generalized): Secondary | ICD-10-CM | POA: Diagnosis not present

## 2021-10-07 DIAGNOSIS — N390 Urinary tract infection, site not specified: Secondary | ICD-10-CM | POA: Diagnosis not present

## 2021-10-08 DIAGNOSIS — M48061 Spinal stenosis, lumbar region without neurogenic claudication: Secondary | ICD-10-CM | POA: Diagnosis not present

## 2021-10-08 DIAGNOSIS — H548 Legal blindness, as defined in USA: Secondary | ICD-10-CM | POA: Diagnosis not present

## 2021-10-08 DIAGNOSIS — E785 Hyperlipidemia, unspecified: Secondary | ICD-10-CM | POA: Diagnosis not present

## 2021-10-08 DIAGNOSIS — G629 Polyneuropathy, unspecified: Secondary | ICD-10-CM | POA: Diagnosis not present

## 2021-10-08 DIAGNOSIS — M1990 Unspecified osteoarthritis, unspecified site: Secondary | ICD-10-CM | POA: Diagnosis not present

## 2021-10-08 DIAGNOSIS — N3281 Overactive bladder: Secondary | ICD-10-CM | POA: Diagnosis not present

## 2021-10-08 DIAGNOSIS — F419 Anxiety disorder, unspecified: Secondary | ICD-10-CM | POA: Diagnosis not present

## 2021-10-08 DIAGNOSIS — H8110 Benign paroxysmal vertigo, unspecified ear: Secondary | ICD-10-CM | POA: Diagnosis not present

## 2021-10-08 DIAGNOSIS — Z9181 History of falling: Secondary | ICD-10-CM | POA: Diagnosis not present

## 2021-10-08 DIAGNOSIS — R5383 Other fatigue: Secondary | ICD-10-CM | POA: Diagnosis not present

## 2021-10-08 DIAGNOSIS — I10 Essential (primary) hypertension: Secondary | ICD-10-CM | POA: Diagnosis not present

## 2021-10-08 DIAGNOSIS — Z6833 Body mass index (BMI) 33.0-33.9, adult: Secondary | ICD-10-CM | POA: Diagnosis not present

## 2021-10-08 DIAGNOSIS — H3552 Pigmentary retinal dystrophy: Secondary | ICD-10-CM | POA: Diagnosis not present

## 2021-10-08 DIAGNOSIS — G8929 Other chronic pain: Secondary | ICD-10-CM | POA: Diagnosis not present

## 2021-10-08 DIAGNOSIS — Z87440 Personal history of urinary (tract) infections: Secondary | ICD-10-CM | POA: Diagnosis not present

## 2021-10-12 DIAGNOSIS — G629 Polyneuropathy, unspecified: Secondary | ICD-10-CM | POA: Diagnosis not present

## 2021-10-12 DIAGNOSIS — Z9181 History of falling: Secondary | ICD-10-CM | POA: Diagnosis not present

## 2021-10-12 DIAGNOSIS — M48061 Spinal stenosis, lumbar region without neurogenic claudication: Secondary | ICD-10-CM | POA: Diagnosis not present

## 2021-10-12 DIAGNOSIS — Z6833 Body mass index (BMI) 33.0-33.9, adult: Secondary | ICD-10-CM | POA: Diagnosis not present

## 2021-10-12 DIAGNOSIS — F419 Anxiety disorder, unspecified: Secondary | ICD-10-CM | POA: Diagnosis not present

## 2021-10-12 DIAGNOSIS — H3552 Pigmentary retinal dystrophy: Secondary | ICD-10-CM | POA: Diagnosis not present

## 2021-10-12 DIAGNOSIS — H548 Legal blindness, as defined in USA: Secondary | ICD-10-CM | POA: Diagnosis not present

## 2021-10-12 DIAGNOSIS — R5383 Other fatigue: Secondary | ICD-10-CM | POA: Diagnosis not present

## 2021-10-12 DIAGNOSIS — I10 Essential (primary) hypertension: Secondary | ICD-10-CM | POA: Diagnosis not present

## 2021-10-12 DIAGNOSIS — M1990 Unspecified osteoarthritis, unspecified site: Secondary | ICD-10-CM | POA: Diagnosis not present

## 2021-10-12 DIAGNOSIS — N3281 Overactive bladder: Secondary | ICD-10-CM | POA: Diagnosis not present

## 2021-10-12 DIAGNOSIS — G8929 Other chronic pain: Secondary | ICD-10-CM | POA: Diagnosis not present

## 2021-10-12 DIAGNOSIS — H8110 Benign paroxysmal vertigo, unspecified ear: Secondary | ICD-10-CM | POA: Diagnosis not present

## 2021-10-12 DIAGNOSIS — E785 Hyperlipidemia, unspecified: Secondary | ICD-10-CM | POA: Diagnosis not present

## 2021-10-12 DIAGNOSIS — Z87440 Personal history of urinary (tract) infections: Secondary | ICD-10-CM | POA: Diagnosis not present

## 2021-10-19 DIAGNOSIS — Z87440 Personal history of urinary (tract) infections: Secondary | ICD-10-CM | POA: Diagnosis not present

## 2021-10-19 DIAGNOSIS — R5383 Other fatigue: Secondary | ICD-10-CM | POA: Diagnosis not present

## 2021-10-19 DIAGNOSIS — H548 Legal blindness, as defined in USA: Secondary | ICD-10-CM | POA: Diagnosis not present

## 2021-10-19 DIAGNOSIS — F419 Anxiety disorder, unspecified: Secondary | ICD-10-CM | POA: Diagnosis not present

## 2021-10-19 DIAGNOSIS — H8110 Benign paroxysmal vertigo, unspecified ear: Secondary | ICD-10-CM | POA: Diagnosis not present

## 2021-10-19 DIAGNOSIS — G8929 Other chronic pain: Secondary | ICD-10-CM | POA: Diagnosis not present

## 2021-10-19 DIAGNOSIS — E785 Hyperlipidemia, unspecified: Secondary | ICD-10-CM | POA: Diagnosis not present

## 2021-10-19 DIAGNOSIS — M48061 Spinal stenosis, lumbar region without neurogenic claudication: Secondary | ICD-10-CM | POA: Diagnosis not present

## 2021-10-19 DIAGNOSIS — Z9181 History of falling: Secondary | ICD-10-CM | POA: Diagnosis not present

## 2021-10-19 DIAGNOSIS — G629 Polyneuropathy, unspecified: Secondary | ICD-10-CM | POA: Diagnosis not present

## 2021-10-19 DIAGNOSIS — Z6833 Body mass index (BMI) 33.0-33.9, adult: Secondary | ICD-10-CM | POA: Diagnosis not present

## 2021-10-19 DIAGNOSIS — M1990 Unspecified osteoarthritis, unspecified site: Secondary | ICD-10-CM | POA: Diagnosis not present

## 2021-10-19 DIAGNOSIS — I10 Essential (primary) hypertension: Secondary | ICD-10-CM | POA: Diagnosis not present

## 2021-10-19 DIAGNOSIS — H3552 Pigmentary retinal dystrophy: Secondary | ICD-10-CM | POA: Diagnosis not present

## 2021-10-19 DIAGNOSIS — N3281 Overactive bladder: Secondary | ICD-10-CM | POA: Diagnosis not present

## 2021-10-25 DIAGNOSIS — H8110 Benign paroxysmal vertigo, unspecified ear: Secondary | ICD-10-CM | POA: Diagnosis not present

## 2021-10-25 DIAGNOSIS — E785 Hyperlipidemia, unspecified: Secondary | ICD-10-CM | POA: Diagnosis not present

## 2021-10-25 DIAGNOSIS — G629 Polyneuropathy, unspecified: Secondary | ICD-10-CM | POA: Diagnosis not present

## 2021-10-25 DIAGNOSIS — Z9181 History of falling: Secondary | ICD-10-CM | POA: Diagnosis not present

## 2021-10-25 DIAGNOSIS — N3281 Overactive bladder: Secondary | ICD-10-CM | POA: Diagnosis not present

## 2021-10-25 DIAGNOSIS — H3552 Pigmentary retinal dystrophy: Secondary | ICD-10-CM | POA: Diagnosis not present

## 2021-10-25 DIAGNOSIS — G8929 Other chronic pain: Secondary | ICD-10-CM | POA: Diagnosis not present

## 2021-10-25 DIAGNOSIS — I10 Essential (primary) hypertension: Secondary | ICD-10-CM | POA: Diagnosis not present

## 2021-10-25 DIAGNOSIS — F419 Anxiety disorder, unspecified: Secondary | ICD-10-CM | POA: Diagnosis not present

## 2021-10-25 DIAGNOSIS — R5383 Other fatigue: Secondary | ICD-10-CM | POA: Diagnosis not present

## 2021-10-25 DIAGNOSIS — M48061 Spinal stenosis, lumbar region without neurogenic claudication: Secondary | ICD-10-CM | POA: Diagnosis not present

## 2021-10-25 DIAGNOSIS — Z6833 Body mass index (BMI) 33.0-33.9, adult: Secondary | ICD-10-CM | POA: Diagnosis not present

## 2021-10-25 DIAGNOSIS — H548 Legal blindness, as defined in USA: Secondary | ICD-10-CM | POA: Diagnosis not present

## 2021-10-25 DIAGNOSIS — Z87440 Personal history of urinary (tract) infections: Secondary | ICD-10-CM | POA: Diagnosis not present

## 2021-10-25 DIAGNOSIS — M1990 Unspecified osteoarthritis, unspecified site: Secondary | ICD-10-CM | POA: Diagnosis not present

## 2021-10-26 DIAGNOSIS — N390 Urinary tract infection, site not specified: Secondary | ICD-10-CM | POA: Diagnosis not present

## 2021-11-03 DIAGNOSIS — H548 Legal blindness, as defined in USA: Secondary | ICD-10-CM | POA: Diagnosis not present

## 2021-11-03 DIAGNOSIS — H3552 Pigmentary retinal dystrophy: Secondary | ICD-10-CM | POA: Diagnosis not present

## 2021-11-03 DIAGNOSIS — F419 Anxiety disorder, unspecified: Secondary | ICD-10-CM | POA: Diagnosis not present

## 2021-11-03 DIAGNOSIS — Z6833 Body mass index (BMI) 33.0-33.9, adult: Secondary | ICD-10-CM | POA: Diagnosis not present

## 2021-11-03 DIAGNOSIS — H8110 Benign paroxysmal vertigo, unspecified ear: Secondary | ICD-10-CM | POA: Diagnosis not present

## 2021-11-03 DIAGNOSIS — M48061 Spinal stenosis, lumbar region without neurogenic claudication: Secondary | ICD-10-CM | POA: Diagnosis not present

## 2021-11-03 DIAGNOSIS — E785 Hyperlipidemia, unspecified: Secondary | ICD-10-CM | POA: Diagnosis not present

## 2021-11-03 DIAGNOSIS — G629 Polyneuropathy, unspecified: Secondary | ICD-10-CM | POA: Diagnosis not present

## 2021-11-03 DIAGNOSIS — I10 Essential (primary) hypertension: Secondary | ICD-10-CM | POA: Diagnosis not present

## 2021-11-03 DIAGNOSIS — Z87440 Personal history of urinary (tract) infections: Secondary | ICD-10-CM | POA: Diagnosis not present

## 2021-11-03 DIAGNOSIS — G8929 Other chronic pain: Secondary | ICD-10-CM | POA: Diagnosis not present

## 2021-11-03 DIAGNOSIS — Z9181 History of falling: Secondary | ICD-10-CM | POA: Diagnosis not present

## 2021-11-03 DIAGNOSIS — R5383 Other fatigue: Secondary | ICD-10-CM | POA: Diagnosis not present

## 2021-11-03 DIAGNOSIS — M1990 Unspecified osteoarthritis, unspecified site: Secondary | ICD-10-CM | POA: Diagnosis not present

## 2021-11-03 DIAGNOSIS — N3281 Overactive bladder: Secondary | ICD-10-CM | POA: Diagnosis not present

## 2021-11-04 DIAGNOSIS — M542 Cervicalgia: Secondary | ICD-10-CM | POA: Diagnosis not present

## 2021-11-12 DIAGNOSIS — F419 Anxiety disorder, unspecified: Secondary | ICD-10-CM | POA: Diagnosis not present

## 2021-11-12 DIAGNOSIS — Z9181 History of falling: Secondary | ICD-10-CM | POA: Diagnosis not present

## 2021-11-12 DIAGNOSIS — H548 Legal blindness, as defined in USA: Secondary | ICD-10-CM | POA: Diagnosis not present

## 2021-11-12 DIAGNOSIS — H8110 Benign paroxysmal vertigo, unspecified ear: Secondary | ICD-10-CM | POA: Diagnosis not present

## 2021-11-12 DIAGNOSIS — Z6833 Body mass index (BMI) 33.0-33.9, adult: Secondary | ICD-10-CM | POA: Diagnosis not present

## 2021-11-12 DIAGNOSIS — M48061 Spinal stenosis, lumbar region without neurogenic claudication: Secondary | ICD-10-CM | POA: Diagnosis not present

## 2021-11-12 DIAGNOSIS — Z87440 Personal history of urinary (tract) infections: Secondary | ICD-10-CM | POA: Diagnosis not present

## 2021-11-12 DIAGNOSIS — I10 Essential (primary) hypertension: Secondary | ICD-10-CM | POA: Diagnosis not present

## 2021-11-12 DIAGNOSIS — N3281 Overactive bladder: Secondary | ICD-10-CM | POA: Diagnosis not present

## 2021-11-12 DIAGNOSIS — R5383 Other fatigue: Secondary | ICD-10-CM | POA: Diagnosis not present

## 2021-11-12 DIAGNOSIS — G8929 Other chronic pain: Secondary | ICD-10-CM | POA: Diagnosis not present

## 2021-11-12 DIAGNOSIS — E785 Hyperlipidemia, unspecified: Secondary | ICD-10-CM | POA: Diagnosis not present

## 2021-11-12 DIAGNOSIS — H3552 Pigmentary retinal dystrophy: Secondary | ICD-10-CM | POA: Diagnosis not present

## 2021-11-12 DIAGNOSIS — G629 Polyneuropathy, unspecified: Secondary | ICD-10-CM | POA: Diagnosis not present

## 2021-11-12 DIAGNOSIS — M1990 Unspecified osteoarthritis, unspecified site: Secondary | ICD-10-CM | POA: Diagnosis not present

## 2021-11-19 DIAGNOSIS — H3552 Pigmentary retinal dystrophy: Secondary | ICD-10-CM | POA: Diagnosis not present

## 2021-11-19 DIAGNOSIS — H548 Legal blindness, as defined in USA: Secondary | ICD-10-CM | POA: Diagnosis not present

## 2021-11-19 DIAGNOSIS — E785 Hyperlipidemia, unspecified: Secondary | ICD-10-CM | POA: Diagnosis not present

## 2021-11-19 DIAGNOSIS — G8929 Other chronic pain: Secondary | ICD-10-CM | POA: Diagnosis not present

## 2021-11-19 DIAGNOSIS — F419 Anxiety disorder, unspecified: Secondary | ICD-10-CM | POA: Diagnosis not present

## 2021-11-19 DIAGNOSIS — N3281 Overactive bladder: Secondary | ICD-10-CM | POA: Diagnosis not present

## 2021-11-19 DIAGNOSIS — M1990 Unspecified osteoarthritis, unspecified site: Secondary | ICD-10-CM | POA: Diagnosis not present

## 2021-11-19 DIAGNOSIS — Z6833 Body mass index (BMI) 33.0-33.9, adult: Secondary | ICD-10-CM | POA: Diagnosis not present

## 2021-11-19 DIAGNOSIS — Z9181 History of falling: Secondary | ICD-10-CM | POA: Diagnosis not present

## 2021-11-19 DIAGNOSIS — M48061 Spinal stenosis, lumbar region without neurogenic claudication: Secondary | ICD-10-CM | POA: Diagnosis not present

## 2021-11-19 DIAGNOSIS — Z87440 Personal history of urinary (tract) infections: Secondary | ICD-10-CM | POA: Diagnosis not present

## 2021-11-19 DIAGNOSIS — H8110 Benign paroxysmal vertigo, unspecified ear: Secondary | ICD-10-CM | POA: Diagnosis not present

## 2021-11-19 DIAGNOSIS — I10 Essential (primary) hypertension: Secondary | ICD-10-CM | POA: Diagnosis not present

## 2021-11-19 DIAGNOSIS — G629 Polyneuropathy, unspecified: Secondary | ICD-10-CM | POA: Diagnosis not present

## 2021-11-19 DIAGNOSIS — R5383 Other fatigue: Secondary | ICD-10-CM | POA: Diagnosis not present

## 2021-11-23 DIAGNOSIS — G8929 Other chronic pain: Secondary | ICD-10-CM | POA: Diagnosis not present

## 2021-11-23 DIAGNOSIS — F419 Anxiety disorder, unspecified: Secondary | ICD-10-CM | POA: Diagnosis not present

## 2021-11-23 DIAGNOSIS — M48061 Spinal stenosis, lumbar region without neurogenic claudication: Secondary | ICD-10-CM | POA: Diagnosis not present

## 2021-11-23 DIAGNOSIS — G629 Polyneuropathy, unspecified: Secondary | ICD-10-CM | POA: Diagnosis not present

## 2021-11-23 DIAGNOSIS — M1990 Unspecified osteoarthritis, unspecified site: Secondary | ICD-10-CM | POA: Diagnosis not present

## 2021-11-23 DIAGNOSIS — Z6833 Body mass index (BMI) 33.0-33.9, adult: Secondary | ICD-10-CM | POA: Diagnosis not present

## 2021-11-23 DIAGNOSIS — Z87440 Personal history of urinary (tract) infections: Secondary | ICD-10-CM | POA: Diagnosis not present

## 2021-11-23 DIAGNOSIS — R5383 Other fatigue: Secondary | ICD-10-CM | POA: Diagnosis not present

## 2021-11-23 DIAGNOSIS — H548 Legal blindness, as defined in USA: Secondary | ICD-10-CM | POA: Diagnosis not present

## 2021-11-23 DIAGNOSIS — Z9181 History of falling: Secondary | ICD-10-CM | POA: Diagnosis not present

## 2021-11-23 DIAGNOSIS — H3552 Pigmentary retinal dystrophy: Secondary | ICD-10-CM | POA: Diagnosis not present

## 2021-11-23 DIAGNOSIS — N3281 Overactive bladder: Secondary | ICD-10-CM | POA: Diagnosis not present

## 2021-11-23 DIAGNOSIS — H8110 Benign paroxysmal vertigo, unspecified ear: Secondary | ICD-10-CM | POA: Diagnosis not present

## 2021-11-23 DIAGNOSIS — I10 Essential (primary) hypertension: Secondary | ICD-10-CM | POA: Diagnosis not present

## 2021-11-23 DIAGNOSIS — E785 Hyperlipidemia, unspecified: Secondary | ICD-10-CM | POA: Diagnosis not present

## 2021-11-27 DIAGNOSIS — H548 Legal blindness, as defined in USA: Secondary | ICD-10-CM | POA: Diagnosis not present

## 2021-11-27 DIAGNOSIS — E669 Obesity, unspecified: Secondary | ICD-10-CM | POA: Diagnosis not present

## 2021-11-27 DIAGNOSIS — Z9849 Cataract extraction status, unspecified eye: Secondary | ICD-10-CM | POA: Diagnosis not present

## 2021-11-27 DIAGNOSIS — R269 Unspecified abnormalities of gait and mobility: Secondary | ICD-10-CM | POA: Diagnosis not present

## 2021-11-27 DIAGNOSIS — G8929 Other chronic pain: Secondary | ICD-10-CM | POA: Diagnosis not present

## 2021-11-27 DIAGNOSIS — H9193 Unspecified hearing loss, bilateral: Secondary | ICD-10-CM | POA: Diagnosis not present

## 2021-11-28 DIAGNOSIS — M48061 Spinal stenosis, lumbar region without neurogenic claudication: Secondary | ICD-10-CM | POA: Diagnosis not present

## 2021-11-28 DIAGNOSIS — G8929 Other chronic pain: Secondary | ICD-10-CM | POA: Diagnosis not present

## 2021-11-28 DIAGNOSIS — E785 Hyperlipidemia, unspecified: Secondary | ICD-10-CM | POA: Diagnosis not present

## 2021-11-28 DIAGNOSIS — H3552 Pigmentary retinal dystrophy: Secondary | ICD-10-CM | POA: Diagnosis not present

## 2021-11-28 DIAGNOSIS — H548 Legal blindness, as defined in USA: Secondary | ICD-10-CM | POA: Diagnosis not present

## 2021-11-28 DIAGNOSIS — N3281 Overactive bladder: Secondary | ICD-10-CM | POA: Diagnosis not present

## 2021-11-28 DIAGNOSIS — H8110 Benign paroxysmal vertigo, unspecified ear: Secondary | ICD-10-CM | POA: Diagnosis not present

## 2021-11-28 DIAGNOSIS — M1990 Unspecified osteoarthritis, unspecified site: Secondary | ICD-10-CM | POA: Diagnosis not present

## 2021-11-28 DIAGNOSIS — G629 Polyneuropathy, unspecified: Secondary | ICD-10-CM | POA: Diagnosis not present

## 2021-11-28 DIAGNOSIS — I10 Essential (primary) hypertension: Secondary | ICD-10-CM | POA: Diagnosis not present

## 2021-11-28 DIAGNOSIS — F419 Anxiety disorder, unspecified: Secondary | ICD-10-CM | POA: Diagnosis not present

## 2021-12-07 DIAGNOSIS — I10 Essential (primary) hypertension: Secondary | ICD-10-CM | POA: Diagnosis not present

## 2021-12-07 DIAGNOSIS — E785 Hyperlipidemia, unspecified: Secondary | ICD-10-CM | POA: Diagnosis not present

## 2021-12-09 DIAGNOSIS — Z9181 History of falling: Secondary | ICD-10-CM | POA: Diagnosis not present

## 2021-12-09 DIAGNOSIS — N3281 Overactive bladder: Secondary | ICD-10-CM | POA: Diagnosis not present

## 2021-12-09 DIAGNOSIS — F419 Anxiety disorder, unspecified: Secondary | ICD-10-CM | POA: Diagnosis not present

## 2021-12-09 DIAGNOSIS — M1990 Unspecified osteoarthritis, unspecified site: Secondary | ICD-10-CM | POA: Diagnosis not present

## 2021-12-09 DIAGNOSIS — Z87440 Personal history of urinary (tract) infections: Secondary | ICD-10-CM | POA: Diagnosis not present

## 2021-12-09 DIAGNOSIS — Z6833 Body mass index (BMI) 33.0-33.9, adult: Secondary | ICD-10-CM | POA: Diagnosis not present

## 2021-12-09 DIAGNOSIS — G8929 Other chronic pain: Secondary | ICD-10-CM | POA: Diagnosis not present

## 2021-12-09 DIAGNOSIS — R5383 Other fatigue: Secondary | ICD-10-CM | POA: Diagnosis not present

## 2021-12-09 DIAGNOSIS — G629 Polyneuropathy, unspecified: Secondary | ICD-10-CM | POA: Diagnosis not present

## 2021-12-09 DIAGNOSIS — H3552 Pigmentary retinal dystrophy: Secondary | ICD-10-CM | POA: Diagnosis not present

## 2021-12-09 DIAGNOSIS — H548 Legal blindness, as defined in USA: Secondary | ICD-10-CM | POA: Diagnosis not present

## 2021-12-09 DIAGNOSIS — I10 Essential (primary) hypertension: Secondary | ICD-10-CM | POA: Diagnosis not present

## 2021-12-09 DIAGNOSIS — E785 Hyperlipidemia, unspecified: Secondary | ICD-10-CM | POA: Diagnosis not present

## 2021-12-09 DIAGNOSIS — H8110 Benign paroxysmal vertigo, unspecified ear: Secondary | ICD-10-CM | POA: Diagnosis not present

## 2021-12-09 DIAGNOSIS — M48061 Spinal stenosis, lumbar region without neurogenic claudication: Secondary | ICD-10-CM | POA: Diagnosis not present

## 2021-12-14 DIAGNOSIS — M48061 Spinal stenosis, lumbar region without neurogenic claudication: Secondary | ICD-10-CM | POA: Diagnosis not present

## 2021-12-14 DIAGNOSIS — M1990 Unspecified osteoarthritis, unspecified site: Secondary | ICD-10-CM | POA: Diagnosis not present

## 2021-12-14 DIAGNOSIS — R5383 Other fatigue: Secondary | ICD-10-CM | POA: Diagnosis not present

## 2021-12-14 DIAGNOSIS — N3281 Overactive bladder: Secondary | ICD-10-CM | POA: Diagnosis not present

## 2021-12-14 DIAGNOSIS — G629 Polyneuropathy, unspecified: Secondary | ICD-10-CM | POA: Diagnosis not present

## 2021-12-14 DIAGNOSIS — F419 Anxiety disorder, unspecified: Secondary | ICD-10-CM | POA: Diagnosis not present

## 2021-12-14 DIAGNOSIS — Z6833 Body mass index (BMI) 33.0-33.9, adult: Secondary | ICD-10-CM | POA: Diagnosis not present

## 2021-12-14 DIAGNOSIS — G8929 Other chronic pain: Secondary | ICD-10-CM | POA: Diagnosis not present

## 2021-12-14 DIAGNOSIS — I10 Essential (primary) hypertension: Secondary | ICD-10-CM | POA: Diagnosis not present

## 2021-12-14 DIAGNOSIS — E785 Hyperlipidemia, unspecified: Secondary | ICD-10-CM | POA: Diagnosis not present

## 2021-12-14 DIAGNOSIS — H548 Legal blindness, as defined in USA: Secondary | ICD-10-CM | POA: Diagnosis not present

## 2021-12-14 DIAGNOSIS — Z9181 History of falling: Secondary | ICD-10-CM | POA: Diagnosis not present

## 2021-12-14 DIAGNOSIS — H8110 Benign paroxysmal vertigo, unspecified ear: Secondary | ICD-10-CM | POA: Diagnosis not present

## 2021-12-14 DIAGNOSIS — H3552 Pigmentary retinal dystrophy: Secondary | ICD-10-CM | POA: Diagnosis not present

## 2021-12-14 DIAGNOSIS — Z87440 Personal history of urinary (tract) infections: Secondary | ICD-10-CM | POA: Diagnosis not present

## 2021-12-15 DIAGNOSIS — I10 Essential (primary) hypertension: Secondary | ICD-10-CM | POA: Diagnosis not present

## 2021-12-15 DIAGNOSIS — R7989 Other specified abnormal findings of blood chemistry: Secondary | ICD-10-CM | POA: Diagnosis not present

## 2021-12-15 DIAGNOSIS — F419 Anxiety disorder, unspecified: Secondary | ICD-10-CM | POA: Diagnosis not present

## 2021-12-15 DIAGNOSIS — E785 Hyperlipidemia, unspecified: Secondary | ICD-10-CM | POA: Diagnosis not present

## 2021-12-15 DIAGNOSIS — R5383 Other fatigue: Secondary | ICD-10-CM | POA: Diagnosis not present

## 2021-12-18 DIAGNOSIS — E785 Hyperlipidemia, unspecified: Secondary | ICD-10-CM | POA: Diagnosis not present

## 2021-12-18 DIAGNOSIS — H8110 Benign paroxysmal vertigo, unspecified ear: Secondary | ICD-10-CM | POA: Diagnosis not present

## 2021-12-18 DIAGNOSIS — I10 Essential (primary) hypertension: Secondary | ICD-10-CM | POA: Diagnosis not present

## 2021-12-18 DIAGNOSIS — M48061 Spinal stenosis, lumbar region without neurogenic claudication: Secondary | ICD-10-CM | POA: Diagnosis not present

## 2021-12-18 DIAGNOSIS — R82998 Other abnormal findings in urine: Secondary | ICD-10-CM | POA: Diagnosis not present

## 2021-12-18 DIAGNOSIS — Z66 Do not resuscitate: Secondary | ICD-10-CM | POA: Diagnosis not present

## 2021-12-18 DIAGNOSIS — Z23 Encounter for immunization: Secondary | ICD-10-CM | POA: Diagnosis not present

## 2021-12-18 DIAGNOSIS — N3281 Overactive bladder: Secondary | ICD-10-CM | POA: Diagnosis not present

## 2021-12-18 DIAGNOSIS — Z0000 Encounter for general adult medical examination without abnormal findings: Secondary | ICD-10-CM | POA: Diagnosis not present

## 2021-12-18 DIAGNOSIS — R5383 Other fatigue: Secondary | ICD-10-CM | POA: Diagnosis not present

## 2021-12-26 DIAGNOSIS — H548 Legal blindness, as defined in USA: Secondary | ICD-10-CM | POA: Diagnosis not present

## 2021-12-26 DIAGNOSIS — I10 Essential (primary) hypertension: Secondary | ICD-10-CM | POA: Diagnosis not present

## 2021-12-26 DIAGNOSIS — Z87440 Personal history of urinary (tract) infections: Secondary | ICD-10-CM | POA: Diagnosis not present

## 2021-12-26 DIAGNOSIS — H3552 Pigmentary retinal dystrophy: Secondary | ICD-10-CM | POA: Diagnosis not present

## 2021-12-26 DIAGNOSIS — R5383 Other fatigue: Secondary | ICD-10-CM | POA: Diagnosis not present

## 2021-12-26 DIAGNOSIS — F419 Anxiety disorder, unspecified: Secondary | ICD-10-CM | POA: Diagnosis not present

## 2021-12-26 DIAGNOSIS — H8110 Benign paroxysmal vertigo, unspecified ear: Secondary | ICD-10-CM | POA: Diagnosis not present

## 2021-12-26 DIAGNOSIS — G8929 Other chronic pain: Secondary | ICD-10-CM | POA: Diagnosis not present

## 2021-12-26 DIAGNOSIS — M1990 Unspecified osteoarthritis, unspecified site: Secondary | ICD-10-CM | POA: Diagnosis not present

## 2021-12-26 DIAGNOSIS — Z9181 History of falling: Secondary | ICD-10-CM | POA: Diagnosis not present

## 2021-12-26 DIAGNOSIS — G629 Polyneuropathy, unspecified: Secondary | ICD-10-CM | POA: Diagnosis not present

## 2021-12-26 DIAGNOSIS — E785 Hyperlipidemia, unspecified: Secondary | ICD-10-CM | POA: Diagnosis not present

## 2021-12-26 DIAGNOSIS — Z6833 Body mass index (BMI) 33.0-33.9, adult: Secondary | ICD-10-CM | POA: Diagnosis not present

## 2021-12-26 DIAGNOSIS — M48061 Spinal stenosis, lumbar region without neurogenic claudication: Secondary | ICD-10-CM | POA: Diagnosis not present

## 2021-12-26 DIAGNOSIS — N3281 Overactive bladder: Secondary | ICD-10-CM | POA: Diagnosis not present

## 2022-01-12 DIAGNOSIS — G629 Polyneuropathy, unspecified: Secondary | ICD-10-CM | POA: Diagnosis not present

## 2022-01-12 DIAGNOSIS — E785 Hyperlipidemia, unspecified: Secondary | ICD-10-CM | POA: Diagnosis not present

## 2022-01-12 DIAGNOSIS — F419 Anxiety disorder, unspecified: Secondary | ICD-10-CM | POA: Diagnosis not present

## 2022-01-12 DIAGNOSIS — H8110 Benign paroxysmal vertigo, unspecified ear: Secondary | ICD-10-CM | POA: Diagnosis not present

## 2022-01-12 DIAGNOSIS — H548 Legal blindness, as defined in USA: Secondary | ICD-10-CM | POA: Diagnosis not present

## 2022-01-12 DIAGNOSIS — Z87440 Personal history of urinary (tract) infections: Secondary | ICD-10-CM | POA: Diagnosis not present

## 2022-01-12 DIAGNOSIS — G8929 Other chronic pain: Secondary | ICD-10-CM | POA: Diagnosis not present

## 2022-01-12 DIAGNOSIS — R5383 Other fatigue: Secondary | ICD-10-CM | POA: Diagnosis not present

## 2022-01-12 DIAGNOSIS — N3281 Overactive bladder: Secondary | ICD-10-CM | POA: Diagnosis not present

## 2022-01-12 DIAGNOSIS — Z9181 History of falling: Secondary | ICD-10-CM | POA: Diagnosis not present

## 2022-01-12 DIAGNOSIS — Z6833 Body mass index (BMI) 33.0-33.9, adult: Secondary | ICD-10-CM | POA: Diagnosis not present

## 2022-01-12 DIAGNOSIS — M48061 Spinal stenosis, lumbar region without neurogenic claudication: Secondary | ICD-10-CM | POA: Diagnosis not present

## 2022-01-12 DIAGNOSIS — M1990 Unspecified osteoarthritis, unspecified site: Secondary | ICD-10-CM | POA: Diagnosis not present

## 2022-01-12 DIAGNOSIS — I10 Essential (primary) hypertension: Secondary | ICD-10-CM | POA: Diagnosis not present

## 2022-01-12 DIAGNOSIS — H3552 Pigmentary retinal dystrophy: Secondary | ICD-10-CM | POA: Diagnosis not present

## 2022-01-22 DIAGNOSIS — E785 Hyperlipidemia, unspecified: Secondary | ICD-10-CM | POA: Diagnosis not present

## 2022-01-22 DIAGNOSIS — Z87440 Personal history of urinary (tract) infections: Secondary | ICD-10-CM | POA: Diagnosis not present

## 2022-01-22 DIAGNOSIS — Z6833 Body mass index (BMI) 33.0-33.9, adult: Secondary | ICD-10-CM | POA: Diagnosis not present

## 2022-01-22 DIAGNOSIS — H548 Legal blindness, as defined in USA: Secondary | ICD-10-CM | POA: Diagnosis not present

## 2022-01-22 DIAGNOSIS — I10 Essential (primary) hypertension: Secondary | ICD-10-CM | POA: Diagnosis not present

## 2022-01-22 DIAGNOSIS — H3552 Pigmentary retinal dystrophy: Secondary | ICD-10-CM | POA: Diagnosis not present

## 2022-01-22 DIAGNOSIS — F419 Anxiety disorder, unspecified: Secondary | ICD-10-CM | POA: Diagnosis not present

## 2022-01-22 DIAGNOSIS — M1990 Unspecified osteoarthritis, unspecified site: Secondary | ICD-10-CM | POA: Diagnosis not present

## 2022-01-22 DIAGNOSIS — M48061 Spinal stenosis, lumbar region without neurogenic claudication: Secondary | ICD-10-CM | POA: Diagnosis not present

## 2022-01-22 DIAGNOSIS — R5383 Other fatigue: Secondary | ICD-10-CM | POA: Diagnosis not present

## 2022-01-22 DIAGNOSIS — Z9181 History of falling: Secondary | ICD-10-CM | POA: Diagnosis not present

## 2022-01-22 DIAGNOSIS — G8929 Other chronic pain: Secondary | ICD-10-CM | POA: Diagnosis not present

## 2022-01-22 DIAGNOSIS — H8110 Benign paroxysmal vertigo, unspecified ear: Secondary | ICD-10-CM | POA: Diagnosis not present

## 2022-01-22 DIAGNOSIS — N3281 Overactive bladder: Secondary | ICD-10-CM | POA: Diagnosis not present

## 2022-01-22 DIAGNOSIS — G629 Polyneuropathy, unspecified: Secondary | ICD-10-CM | POA: Diagnosis not present

## 2022-01-27 DIAGNOSIS — N3281 Overactive bladder: Secondary | ICD-10-CM | POA: Diagnosis not present

## 2022-01-27 DIAGNOSIS — H8110 Benign paroxysmal vertigo, unspecified ear: Secondary | ICD-10-CM | POA: Diagnosis not present

## 2022-01-27 DIAGNOSIS — H3552 Pigmentary retinal dystrophy: Secondary | ICD-10-CM | POA: Diagnosis not present

## 2022-01-27 DIAGNOSIS — G629 Polyneuropathy, unspecified: Secondary | ICD-10-CM | POA: Diagnosis not present

## 2022-01-27 DIAGNOSIS — H548 Legal blindness, as defined in USA: Secondary | ICD-10-CM | POA: Diagnosis not present

## 2022-01-27 DIAGNOSIS — M48061 Spinal stenosis, lumbar region without neurogenic claudication: Secondary | ICD-10-CM | POA: Diagnosis not present

## 2022-01-27 DIAGNOSIS — G8929 Other chronic pain: Secondary | ICD-10-CM | POA: Diagnosis not present

## 2022-01-27 DIAGNOSIS — F419 Anxiety disorder, unspecified: Secondary | ICD-10-CM | POA: Diagnosis not present

## 2022-01-27 DIAGNOSIS — E785 Hyperlipidemia, unspecified: Secondary | ICD-10-CM | POA: Diagnosis not present

## 2022-01-27 DIAGNOSIS — M1990 Unspecified osteoarthritis, unspecified site: Secondary | ICD-10-CM | POA: Diagnosis not present

## 2022-01-27 DIAGNOSIS — I10 Essential (primary) hypertension: Secondary | ICD-10-CM | POA: Diagnosis not present

## 2022-02-05 DIAGNOSIS — H3552 Pigmentary retinal dystrophy: Secondary | ICD-10-CM | POA: Diagnosis not present

## 2022-02-05 DIAGNOSIS — F419 Anxiety disorder, unspecified: Secondary | ICD-10-CM | POA: Diagnosis not present

## 2022-02-05 DIAGNOSIS — H8110 Benign paroxysmal vertigo, unspecified ear: Secondary | ICD-10-CM | POA: Diagnosis not present

## 2022-02-05 DIAGNOSIS — Z87440 Personal history of urinary (tract) infections: Secondary | ICD-10-CM | POA: Diagnosis not present

## 2022-02-05 DIAGNOSIS — M1990 Unspecified osteoarthritis, unspecified site: Secondary | ICD-10-CM | POA: Diagnosis not present

## 2022-02-05 DIAGNOSIS — E785 Hyperlipidemia, unspecified: Secondary | ICD-10-CM | POA: Diagnosis not present

## 2022-02-05 DIAGNOSIS — M48061 Spinal stenosis, lumbar region without neurogenic claudication: Secondary | ICD-10-CM | POA: Diagnosis not present

## 2022-02-05 DIAGNOSIS — G629 Polyneuropathy, unspecified: Secondary | ICD-10-CM | POA: Diagnosis not present

## 2022-02-05 DIAGNOSIS — N3281 Overactive bladder: Secondary | ICD-10-CM | POA: Diagnosis not present

## 2022-02-05 DIAGNOSIS — H548 Legal blindness, as defined in USA: Secondary | ICD-10-CM | POA: Diagnosis not present

## 2022-02-05 DIAGNOSIS — R5383 Other fatigue: Secondary | ICD-10-CM | POA: Diagnosis not present

## 2022-02-05 DIAGNOSIS — Z9181 History of falling: Secondary | ICD-10-CM | POA: Diagnosis not present

## 2022-02-05 DIAGNOSIS — Z6833 Body mass index (BMI) 33.0-33.9, adult: Secondary | ICD-10-CM | POA: Diagnosis not present

## 2022-02-05 DIAGNOSIS — I10 Essential (primary) hypertension: Secondary | ICD-10-CM | POA: Diagnosis not present

## 2022-02-05 DIAGNOSIS — G8929 Other chronic pain: Secondary | ICD-10-CM | POA: Diagnosis not present

## 2022-02-15 DIAGNOSIS — R5383 Other fatigue: Secondary | ICD-10-CM | POA: Diagnosis not present

## 2022-02-15 DIAGNOSIS — G629 Polyneuropathy, unspecified: Secondary | ICD-10-CM | POA: Diagnosis not present

## 2022-02-15 DIAGNOSIS — H3552 Pigmentary retinal dystrophy: Secondary | ICD-10-CM | POA: Diagnosis not present

## 2022-02-15 DIAGNOSIS — Z87440 Personal history of urinary (tract) infections: Secondary | ICD-10-CM | POA: Diagnosis not present

## 2022-02-15 DIAGNOSIS — M1990 Unspecified osteoarthritis, unspecified site: Secondary | ICD-10-CM | POA: Diagnosis not present

## 2022-02-15 DIAGNOSIS — H8110 Benign paroxysmal vertigo, unspecified ear: Secondary | ICD-10-CM | POA: Diagnosis not present

## 2022-02-15 DIAGNOSIS — Z9181 History of falling: Secondary | ICD-10-CM | POA: Diagnosis not present

## 2022-02-15 DIAGNOSIS — G8929 Other chronic pain: Secondary | ICD-10-CM | POA: Diagnosis not present

## 2022-02-15 DIAGNOSIS — H548 Legal blindness, as defined in USA: Secondary | ICD-10-CM | POA: Diagnosis not present

## 2022-02-15 DIAGNOSIS — F419 Anxiety disorder, unspecified: Secondary | ICD-10-CM | POA: Diagnosis not present

## 2022-02-15 DIAGNOSIS — Z6833 Body mass index (BMI) 33.0-33.9, adult: Secondary | ICD-10-CM | POA: Diagnosis not present

## 2022-02-15 DIAGNOSIS — N3281 Overactive bladder: Secondary | ICD-10-CM | POA: Diagnosis not present

## 2022-02-15 DIAGNOSIS — I10 Essential (primary) hypertension: Secondary | ICD-10-CM | POA: Diagnosis not present

## 2022-02-15 DIAGNOSIS — M48061 Spinal stenosis, lumbar region without neurogenic claudication: Secondary | ICD-10-CM | POA: Diagnosis not present

## 2022-02-15 DIAGNOSIS — E785 Hyperlipidemia, unspecified: Secondary | ICD-10-CM | POA: Diagnosis not present

## 2022-02-20 ENCOUNTER — Emergency Department (HOSPITAL_COMMUNITY): Payer: PPO

## 2022-02-20 ENCOUNTER — Encounter (HOSPITAL_COMMUNITY): Payer: Self-pay

## 2022-02-20 ENCOUNTER — Other Ambulatory Visit: Payer: Self-pay

## 2022-02-20 ENCOUNTER — Inpatient Hospital Stay (HOSPITAL_COMMUNITY)
Admission: EM | Admit: 2022-02-20 | Discharge: 2022-02-22 | DRG: 641 | Disposition: A | Discharge status: Home Health | Payer: PPO | Attending: Family Medicine | Admitting: Family Medicine

## 2022-02-20 DIAGNOSIS — Z91013 Allergy to seafood: Secondary | ICD-10-CM

## 2022-02-20 DIAGNOSIS — H3552 Pigmentary retinal dystrophy: Secondary | ICD-10-CM | POA: Diagnosis not present

## 2022-02-20 DIAGNOSIS — R197 Diarrhea, unspecified: Secondary | ICD-10-CM | POA: Diagnosis not present

## 2022-02-20 DIAGNOSIS — N3001 Acute cystitis with hematuria: Secondary | ICD-10-CM | POA: Diagnosis not present

## 2022-02-20 DIAGNOSIS — Z1152 Encounter for screening for COVID-19: Secondary | ICD-10-CM

## 2022-02-20 DIAGNOSIS — Z981 Arthrodesis status: Secondary | ICD-10-CM | POA: Diagnosis not present

## 2022-02-20 DIAGNOSIS — R112 Nausea with vomiting, unspecified: Secondary | ICD-10-CM | POA: Diagnosis not present

## 2022-02-20 DIAGNOSIS — Z885 Allergy status to narcotic agent status: Secondary | ICD-10-CM

## 2022-02-20 DIAGNOSIS — E785 Hyperlipidemia, unspecified: Secondary | ICD-10-CM | POA: Diagnosis present

## 2022-02-20 DIAGNOSIS — R262 Difficulty in walking, not elsewhere classified: Secondary | ICD-10-CM

## 2022-02-20 DIAGNOSIS — Z85038 Personal history of other malignant neoplasm of large intestine: Secondary | ICD-10-CM | POA: Diagnosis not present

## 2022-02-20 DIAGNOSIS — R110 Nausea: Secondary | ICD-10-CM | POA: Diagnosis not present

## 2022-02-20 DIAGNOSIS — E876 Hypokalemia: Secondary | ICD-10-CM | POA: Diagnosis not present

## 2022-02-20 DIAGNOSIS — H9190 Unspecified hearing loss, unspecified ear: Secondary | ICD-10-CM | POA: Diagnosis present

## 2022-02-20 DIAGNOSIS — Z882 Allergy status to sulfonamides status: Secondary | ICD-10-CM

## 2022-02-20 DIAGNOSIS — R42 Dizziness and giddiness: Secondary | ICD-10-CM | POA: Diagnosis not present

## 2022-02-20 DIAGNOSIS — R1084 Generalized abdominal pain: Secondary | ICD-10-CM | POA: Diagnosis not present

## 2022-02-20 DIAGNOSIS — R531 Weakness: Secondary | ICD-10-CM | POA: Diagnosis not present

## 2022-02-20 DIAGNOSIS — G629 Polyneuropathy, unspecified: Secondary | ICD-10-CM | POA: Diagnosis present

## 2022-02-20 DIAGNOSIS — I10 Essential (primary) hypertension: Secondary | ICD-10-CM | POA: Diagnosis not present

## 2022-02-20 DIAGNOSIS — H548 Legal blindness, as defined in USA: Secondary | ICD-10-CM | POA: Diagnosis not present

## 2022-02-20 DIAGNOSIS — R1110 Vomiting, unspecified: Secondary | ICD-10-CM | POA: Diagnosis not present

## 2022-02-20 DIAGNOSIS — Z79899 Other long term (current) drug therapy: Secondary | ICD-10-CM | POA: Diagnosis not present

## 2022-02-20 DIAGNOSIS — K5730 Diverticulosis of large intestine without perforation or abscess without bleeding: Secondary | ICD-10-CM | POA: Diagnosis not present

## 2022-02-20 DIAGNOSIS — K760 Fatty (change of) liver, not elsewhere classified: Secondary | ICD-10-CM | POA: Diagnosis not present

## 2022-02-20 DIAGNOSIS — R109 Unspecified abdominal pain: Secondary | ICD-10-CM

## 2022-02-20 DIAGNOSIS — R519 Headache, unspecified: Secondary | ICD-10-CM | POA: Diagnosis not present

## 2022-02-20 HISTORY — DX: Nausea with vomiting, unspecified: R11.2

## 2022-02-20 LAB — COMPREHENSIVE METABOLIC PANEL
ALT: 27 U/L (ref 0–44)
AST: 22 U/L (ref 15–41)
Albumin: 4.2 g/dL (ref 3.5–5.0)
Alkaline Phosphatase: 49 U/L (ref 38–126)
Anion gap: 8 (ref 5–15)
BUN: 25 mg/dL — ABNORMAL HIGH (ref 8–23)
CO2: 28 mmol/L (ref 22–32)
Calcium: 9.5 mg/dL (ref 8.9–10.3)
Chloride: 106 mmol/L (ref 98–111)
Creatinine, Ser: 0.72 mg/dL (ref 0.44–1.00)
GFR, Estimated: >60 mL/min (ref >60)
Glucose, Bld: 110 mg/dL — ABNORMAL HIGH (ref 70–99)
Potassium: 3.4 mmol/L — ABNORMAL LOW (ref 3.5–5.1)
Sodium: 142 mmol/L (ref 135–145)
Total Bilirubin: 0.7 mg/dL (ref 0.3–1.2)
Total Protein: 7.3 g/dL (ref 6.5–8.1)

## 2022-02-20 LAB — URINALYSIS, ROUTINE W REFLEX MICROSCOPIC
Bilirubin Urine: NEGATIVE
Glucose, UA: NEGATIVE mg/dL
Hgb urine dipstick: NEGATIVE
Ketones, ur: 5 mg/dL — AB
Nitrite: POSITIVE — AB
Protein, ur: 30 mg/dL — AB
Specific Gravity, Urine: 1.035 — ABNORMAL HIGH (ref 1.005–1.030)
pH: 5 (ref 5.0–8.0)

## 2022-02-20 LAB — RESP PANEL BY RT-PCR (FLU A&B, COVID) ARPGX2
Influenza A by PCR: NEGATIVE
Influenza B by PCR: NEGATIVE
SARS Coronavirus 2 by RT PCR: NEGATIVE

## 2022-02-20 LAB — CBC WITH DIFFERENTIAL/PLATELET
Abs Immature Granulocytes: 0.11 10*3/uL — ABNORMAL HIGH (ref 0.00–0.07)
Basophils Absolute: 0 10*3/uL (ref 0.0–0.1)
Basophils Relative: 0 %
Eosinophils Absolute: 0 10*3/uL (ref 0.0–0.5)
Eosinophils Relative: 0 %
HCT: 40.2 % (ref 36.0–46.0)
Hemoglobin: 13.1 g/dL (ref 12.0–15.0)
Immature Granulocytes: 2 %
Lymphocytes Relative: 15 %
Lymphs Abs: 1.1 10*3/uL (ref 0.7–4.0)
MCH: 30 pg (ref 26.0–34.0)
MCHC: 32.6 g/dL (ref 30.0–36.0)
MCV: 92 fL (ref 80.0–100.0)
Monocytes Absolute: 0.5 10*3/uL (ref 0.1–1.0)
Monocytes Relative: 6 %
Neutro Abs: 5.7 10*3/uL (ref 1.7–7.7)
Neutrophils Relative %: 77 %
Platelets: 184 10*3/uL (ref 150–400)
RBC: 4.37 MIL/uL (ref 3.87–5.11)
RDW: 13.5 % (ref 11.5–15.5)
WBC: 7.4 10*3/uL (ref 4.0–10.5)
nRBC: 0 % (ref 0.0–0.2)

## 2022-02-20 LAB — LIPASE, BLOOD: Lipase: 39 U/L (ref 11–51)

## 2022-02-20 LAB — TROPONIN I (HIGH SENSITIVITY)
Troponin I (High Sensitivity): 8 ng/L (ref <18)
Troponin I (High Sensitivity): 7 ng/L (ref <18)

## 2022-02-20 MED ORDER — ACETAMINOPHEN 325 MG PO TABS
650.0000 mg | ORAL_TABLET | Freq: Four times a day (QID) | ORAL | Status: DC | PRN
  Administered 2022-02-21: 650 mg via ORAL
  Filled 2022-02-20: qty 2

## 2022-02-20 MED ORDER — ONDANSETRON HCL 4 MG PO TABS: 4.0000 mg | ORAL_TABLET | Freq: Four times a day (QID) | ORAL | Status: DC | PRN

## 2022-02-20 MED ORDER — PANTOPRAZOLE SODIUM 40 MG PO TBEC
40.0000 mg | DELAYED_RELEASE_TABLET | Freq: Every day | ORAL | Status: DC
  Administered 2022-02-21 – 2022-02-22 (×2): 40 mg via ORAL
  Filled 2022-02-20 (×3): qty 1

## 2022-02-20 MED ORDER — POLYETHYLENE GLYCOL 3350 17 G PO PACK: 17.0000 g | PACK | Freq: Every day | ORAL | Status: DC | PRN

## 2022-02-20 MED ORDER — POTASSIUM CHLORIDE 10 MEQ/100ML IV SOLN
10.0000 meq | INTRAVENOUS | Status: AC
  Administered 2022-02-20 (×4): 10 meq via INTRAVENOUS
  Filled 2022-02-20 (×3): qty 100

## 2022-02-20 MED ORDER — IOHEXOL 300 MG/ML  SOLN
100.0000 mL | Freq: Once | INTRAMUSCULAR | Status: AC | PRN
  Administered 2022-02-20: 100 mL via INTRAVENOUS

## 2022-02-20 MED ORDER — SODIUM CHLORIDE 0.9 % IV BOLUS
1000.0000 mL | Freq: Once | INTRAVENOUS | Status: AC
  Administered 2022-02-20: 1000 mL via INTRAVENOUS

## 2022-02-20 MED ORDER — SODIUM CHLORIDE 0.9% FLUSH
3.0000 mL | Freq: Two times a day (BID) | INTRAVENOUS | Status: DC
  Administered 2022-02-20 – 2022-02-21 (×3): 3 mL via INTRAVENOUS

## 2022-02-20 MED ORDER — ACETAMINOPHEN 650 MG RE SUPP: 650.0000 mg | Freq: Four times a day (QID) | RECTAL | Status: DC | PRN

## 2022-02-20 MED ORDER — LACTATED RINGERS IV SOLN: INTRAVENOUS | Status: DC

## 2022-02-20 MED ORDER — ENOXAPARIN SODIUM 40 MG/0.4ML IJ SOSY
40.0000 mg | PREFILLED_SYRINGE | Freq: Every day | INTRAMUSCULAR | Status: DC
  Administered 2022-02-20 – 2022-02-21 (×2): 40 mg via SUBCUTANEOUS
  Filled 2022-02-20 (×2): qty 0.4

## 2022-02-20 MED ORDER — ONDANSETRON HCL 4 MG/2ML IJ SOLN: 4.0000 mg | Freq: Four times a day (QID) | INTRAMUSCULAR | Status: DC | PRN

## 2022-02-20 NOTE — H&P (Signed)
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  Initial work-up was unrevealing and plans were made for observation for further evaluation.  History obtained from patient.  She lives alone, ambulates with a walker, is legally blind, has some assistance with bills and a brother supplies her with food via [employer].  Locally a brother has prostate cancer and is unable to assist her, she has no other family in the area.  She reports intermittent vomiting that first occurred in September on her birthday.  Yesterday she had multiple episodes of emesis, was unable to eat anything and was so weak she was unable to get up and get to the bathroom, soiling herself, the bed on the floor.  Associated with this is epigastric to umbilical abdominal pain along the midline.  She has had chills but no fever.  She has a history of appendectomy and partial colectomy and hysterectomy.  She has had UTIs in the past but has had no recent symptoms.  Review of Systems: Negative for fever, positive for chills, positive for legal blindness, no sore throat, no known rash but has had some pruritus of the arms, no chest pain or shortness of breath, no dysuria, no known bleeding.  No abdominal pain.  Notes she did not report headache to me.   Past Medical History:  Diagnosis Date   ANEMIA-NOS    ANXIETY    Arthritis    Cancer ([place]) 2005   colon cancer   Chronic back pain    spondylolisthesis   History of blood transfusion    no abnormal reaction noted   History of shingles    HYPERLIPIDEMIA     takes Pravastatin daily   Legally blind    Nocturia    RETINITIS PIGMENTOSA    SYNDROME, CARPAL TUNNEL    Weakness    numbness and tingling in legs and feet   Past Surgical History:  Procedure Laterality Date   ABDOMINAL HYSTERECTOMY  1989   ANTERIOR LAT LUMBAR FUSION Right 01/06/2018   Procedure: Right Lumbar Two-Three Anterolateral lumbar interbody fusion with lateral plate;  Surgeon: Erline Levine, MD;  Location: [facility];  Service: Neurosurgery;  Laterality: Right;  Right Lumbar Two-Three Anterolateral lumbar interbody fusion with lateral plate   APPENDECTOMY  1946   carapl tunnel release Left    cataract surgery Bilateral    COLONOSCOPY     MAXIMUM ACCESS (MAS)POSTERIOR LUMBAR INTERBODY FUSION (PLIF) 2 LEVEL N/A 11/14/2015   Procedure: Lumbar Four-Five Maximum access posterior lumbar interbody fusion, Lumbar Three-Four Lumbar Four-Five Posterolateral Fusion and Pedicle Screws;  Surgeon: Erline Levine, MD;  Location: [facility] [facility];  Service: Neurosurgery;  Laterality: N/A;  L3-4 L4-5 Maximum access posterior lumbar interbody fusion   OOPHORECTOMY  1989   s/p ganglion cyst  1973   s/p right hemicolectomy  12 yrs ago   Social History:  reports that she has never smoked. She has never used smokeless tobacco. She reports that she does not drink alcohol and does not use drugs.  Allergies  Allergen Reactions   Codeine Shortness Of Breath  Hydromet [Hydrocodone Bit-Homatrop Mbr] Shortness Of Breath and Other (See Comments)    Wheezing   Fish Allergy Other (See Comments)    Burning Sensation and Headache   Morphine Other (See Comments)    "feels funny"   Sulfa Antibiotics Nausea And Vomiting         Family History  Problem Relation Age of Onset   ALS Cousin    Polymyalgia rheumatica Cousin    Breast cancer Maternal Aunt    Colon cancer Neg Hx     Prior to Admission medications   Medication Sig Start Date End Date Taking? Authorizing Provider  Alpha-Lipoic Acid 100 MG CAPS  Take 100 mg by mouth daily.    Yes [provider]  AMBULATORY NON FORMULARY MEDICATION 2 capsules in the morning and at bedtime. Medication Name: Arthroflex   Yes [provider]  AMBULATORY NON FORMULARY MEDICATION 2 capsules in the morning and at bedtime. Medication Name: ART supplement   Yes [provider]  AMBULATORY NON FORMULARY MEDICATION 2 capsules daily. Medication Name: [facility] COMPLEX   Yes [provider]  Cholecalciferol (VITAMIN D3) 3000 units TABS Take 3,000 Units by mouth daily.    Yes [provider]  cyanocobalamin 1000 MCG tablet Take 1,000 mcg by mouth daily.   Yes [provider]  magnesium 30 MG tablet Take 30 mg by mouth daily.   Yes [provider]  cephALEXin (KEFLEX) 500 MG capsule Take 1 capsule (500 mg total) by mouth 4 (four) times daily. Patient not taking: Reported on 02/20/2022 04/03/21   Carlisle Cater, PA-C  loratadine (CLARITIN) 10 MG tablet Take 1 tablet (10 mg total) by mouth daily. Patient not taking: Reported on 02/20/2022 04/03/21   Carlisle Cater, PA-C  ondansetron (ZOFRAN-ODT) 4 MG disintegrating tablet Take 1 tablet (4 mg total) by mouth every 8 (eight) hours as needed for nausea or vomiting. Patient not taking: Reported on 02/20/2022 04/03/21   Carlisle Cater, PA-C    Physical Exam: Vitals:   02/20/22 1400 02/20/22 1505 02/20/22 1530 02/20/22 1630  BP: (!) 162/105 (!) 147/109 (!) 158/112 (!) 159/91  Pulse: 79 76 76 76  Resp: '13 19 19 12  '$ Temp:   98.8 F (37.1 C)   TempSrc:   Oral   SpO2: 90% 95% 95% 95%   Physical Exam Vitals reviewed.  Constitutional:      General: She is not in acute distress.    Appearance: She is not ill-appearing or toxic-appearing.  HENT:     Head: Normocephalic.     Mouth/Throat:     Mouth: Mucous membranes are moist.  Eyes:     Extraocular Movements: Extraocular movements intact.  Cardiovascular:     Rate and Rhythm: Normal rate and regular  rhythm.     Heart sounds: No murmur heard. Pulmonary:     Effort: Pulmonary effort is normal. No respiratory distress.     Breath sounds: No wheezing, rhonchi or rales.  Abdominal:     General: Abdomen is flat. There is no distension.     Palpations: Abdomen is soft. There is no mass.     Tenderness: There is no abdominal tenderness. There is no guarding.  Musculoskeletal:     Right lower leg: No edema.     Left lower leg: No edema.  Skin:    General: Skin is warm.     Findings: No erythema or rash.  Neurological:     General: No focal deficit present.  Mental Status: She is alert.  Psychiatric:        Mood and Affect: Mood normal.        Behavior: Behavior normal.     Data Reviewed: K+ 3.4 Troponins negative CBC WNL Flu/Covid negative U/A equivocal CXR independent review NAD CT abd/pelvis NAD EKG independent review, SR, no acute changes Aortic atherosclerosis  Assessment and Plan: Nausea and vomiting with central abdominal pain, acute on intermittent -- CT abdomen pelvis unrevealing, LFTs unremarkable, lipase within normal limits.  Exam benign. -- Suspect pain is from vomiting. -- Etiology of intermittent nausea and vomiting unclear.  Does report of history of neuropathy, will consider gastric emptying study inpatient versus outpatient -- Antiemetics, IV fluids, liquid diet, supportive care  Hypokalemia -- Secondary to nausea and vomiting.  Replete.  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  She does have an aide of some kind that comes into her sister. -- TOC consultation to review available resources -- Assisted living in the future may be a good option.  EDP noted UTI, however this has been ruled out as the patient is asymptomatic.  Advance Care Planning: Discussed with patient,  endorses DNR/DNI  Consults: none  Family Communication: none  Severity of Illness: The appropriate patient status for this patient is OBSERVATION. Observation status is judged to be reasonable and necessary in order to provide the required intensity of service to ensure the patient's safety. The patient's presenting symptoms, physical exam findings, and initial radiographic and laboratory data in the context of their medical condition is felt to place them at decreased risk for further clinical deterioration. Furthermore, it is anticipated that the patient will be medically stable for discharge from the hospital within 2 midnights of admission.   Author: Murray Hodgkins, MD 02/20/2022 4:56 PM  For on call review [email or web address].

## 2022-02-20 NOTE — ED Provider Notes (Signed)
[facility] DEPT Provider Note   CSN: [id number] Arrival date & time: 02/20/22  0810     History  Chief Complaint  Patient presents with   Headache   Nausea   Emesis   Diarrhea    Kathryn Romero is a 86 y.o. female who presents emergency department with multiple complaints.  History gathered by the patient and EMS at bedside. She has had nausea and vomiting which is intermittent for several weeks. She complains that overnight she developed a mild frontal headache.  She does not normally get headaches. Denies photophobia, phonophobia, UL throbbing, N/V, visual changes, stiff neck, neck pain, rash, or "thunderclap" onset.  The patient states that she is legally blind and has hearing problems.  She lives alone after the death of her husband.  She developed liquid stools and felt unsafe ambulating to the bathroom.  She normally has some difficulty ambulating which she attributes to her balance as well as her vision.  She denies any new ataxia.  She denies abdominal pain but complains of borborygmi. She denies fevers chills chest pain or shortness of breath.  The history is provided by the patient and the EMS personnel.  Headache Associated symptoms: diarrhea and vomiting   Associated symptoms: no fever   Emesis Associated symptoms: diarrhea and headaches   Associated symptoms: no fever   Diarrhea Associated symptoms: headaches and vomiting   Associated symptoms: no fever        Home Medications Prior to Admission medications   Medication Sig Start Date End Date Taking? Authorizing Provider  Alpha-Lipoic Acid 100 MG CAPS Take 100 mg by mouth daily.    Yes [provider]  AMBULATORY NON FORMULARY MEDICATION 2 capsules in the morning and at bedtime. Medication Name: Arthroflex   Yes [provider]  AMBULATORY NON FORMULARY MEDICATION 2 capsules in the morning and at bedtime. Medication Name: ART supplement   Yes [provider]  AMBULATORY NON FORMULARY MEDICATION 2 capsules daily. Medication Name: [facility] COMPLEX   Yes [provider]  Cholecalciferol (VITAMIN D3) 3000 units TABS Take 3,000 Units by mouth daily.    Yes [provider]  cyanocobalamin 1000 MCG tablet Take 1,000 mcg by mouth daily.   Yes [provider]  magnesium 30 MG tablet Take 30 mg by mouth daily.   Yes [provider]  cephALEXin (KEFLEX) 500 MG capsule Take 1 capsule (500 mg total) by mouth 4 (four) times daily. Patient not taking: Reported on 02/20/2022 04/03/21   Carlisle Cater, PA-C  loratadine (CLARITIN) 10 MG tablet Take 1 tablet (10 mg total) by mouth daily. Patient not taking: Reported on 02/20/2022 04/03/21   Carlisle Cater, PA-C  ondansetron (ZOFRAN-ODT) 4 MG disintegrating tablet Take 1 tablet (4 mg total) by mouth every 8 (eight) hours as needed for nausea or vomiting. Patient not taking: Reported on 02/20/2022 04/03/21   Carlisle Cater, PA-C      Allergies    Codeine, Hydromet [hydrocodone bit-homatrop mbr], Fish allergy, Morphine, and Sulfa antibiotics    Review of Systems   Review of Systems  Constitutional:  Negative for fever.  Cardiovascular:  Negative for chest pain.  Gastrointestinal:  Positive for diarrhea and vomiting.  Neurological:  Positive for headaches.    Physical Exam Updated Vital Signs BP (!) 158/112   Pulse 76   Temp 98.8 F (37.1 C) (Oral)   Resp 19   SpO2 95%  Physical Exam Vitals and nursing note reviewed.  Constitutional:  General: She is not in acute distress.    Appearance: She is well-developed. She is not diaphoretic.  HENT:     Head: Normocephalic and atraumatic.     Right Ear: External ear normal.     Left Ear: External ear normal.     Nose: Nose normal.     Mouth/Throat:     Mouth: Mucous membranes are moist.  Eyes:     General: No scleral icterus.    Conjunctiva/sclera: Conjunctivae normal.  Neck:     Meningeal:  Brudzinski's sign and Kernig's sign absent.  Cardiovascular:     Rate and Rhythm: Normal rate and regular rhythm.     Heart sounds: Normal heart sounds. No murmur heard.    No friction rub. No gallop.  Pulmonary:     Effort: Pulmonary effort is normal. No respiratory distress.     Breath sounds: Normal breath sounds.  Abdominal:     General: Bowel sounds are normal. There is no distension.     Palpations: Abdomen is soft. There is no mass.     Tenderness: There is abdominal tenderness (minimally tender) in the right upper quadrant, epigastric area and left upper quadrant. There is no guarding.  Musculoskeletal:     Cervical back: Normal range of motion. No rigidity.  Skin:    General: Skin is warm and dry.     Findings: No rash.  Neurological:     Mental Status: She is alert and oriented to person, place, and time.     GCS: GCS eye subscore is 4. GCS verbal subscore is 5. GCS motor subscore is 6.     Cranial Nerves: No cranial nerve deficit, dysarthria or facial asymmetry.     Sensory: No sensory deficit.     Motor: No weakness.     Coordination: Romberg sign negative.     Gait: Abnormal gait: deferred.     Deep Tendon Reflexes: Reflexes normal. Babinski sign absent on the right side. Babinski sign absent on the left side.  Psychiatric:        Behavior: Behavior normal.     ED Results / Procedures / Treatments   Labs (all labs ordered are listed, but only abnormal results are displayed) Labs Reviewed  CBC WITH DIFFERENTIAL/PLATELET - Abnormal; Notable for the following components:      Result Value   Abs Immature Granulocytes 0.11 (*)    All other components within normal limits  COMPREHENSIVE METABOLIC PANEL - Abnormal; Notable for the following components:   Potassium 3.4 (*)    Glucose, Bld 110 (*)    BUN 25 (*)    All other components within normal limits  URINALYSIS, ROUTINE W REFLEX MICROSCOPIC - Abnormal; Notable for the following components:   APPearance HAZY (*)     Specific Gravity, Urine 1.035 (*)    Ketones, ur 5 (*)    Protein, ur 30 (*)    Nitrite POSITIVE (*)    Leukocytes,Ua TRACE (*)    Bacteria, UA RARE (*)    All other components within normal limits  RESP PANEL BY RT-PCR (FLU A&B, COVID) ARPGX2  LIPASE, BLOOD  TROPONIN I (HIGH SENSITIVITY)  TROPONIN I (HIGH SENSITIVITY)    EKG EKG Interpretation  Date/Time:  Saturday February 20 2022 09:19:06 EDT Ventricular Rate:  78 PR Interval:  205 QRS Duration: 109 QT Interval:  400 QTC Calculation: 456 R Axis:   1 Text Interpretation: Sinus rhythm Low voltage, precordial leads No significant change was found Confirmed by Rancour,  Annie Main [phone]) on 02/20/2022 9:24:32 AM  Radiology CT ABDOMEN PELVIS W CONTRAST  Result Date: 02/20/2022 CLINICAL DATA:  Nausea and vomiting for 3 months. EXAM: CT ABDOMEN AND PELVIS WITH CONTRAST TECHNIQUE: Multidetector CT imaging of the abdomen and pelvis was performed using the standard protocol following bolus administration of intravenous contrast. RADIATION DOSE REDUCTION: This exam was performed according to the departmental dose-optimization program which includes automated exposure control, adjustment of the mA and/or kV according to patient size and/or use of iterative reconstruction technique. CONTRAST:  179m OMNIPAQUE IOHEXOL 300 MG/ML  SOLN COMPARISON:  April 03, 2021 FINDINGS: Lower chest: Mild atelectasis of posterior lung bases are noted. The heart size is normal. Minimal pericardial fluid is noted. Hepatobiliary: Diffuse low density of the liver without focal liver lesion. The gallbladder and biliary tree are normal. Pancreas: Unremarkable. No pancreatic ductal dilatation or surrounding inflammatory changes. Spleen: Normal in size without focal abnormality. Adrenals/Urinary Tract: Adrenal glands are unremarkable. No renal calculi or hydronephrosis identified bilaterally. 8 mm simple cyst is identified in the midpole left kidney. No follow-up is  recommended. No focal right renal lesion is noted. Bladder is unremarkable. Stomach/Bowel: Stomach is within normal limits. The appendix is not seen but no inflammation is noted around cecum. No evidence of bowel wall thickening, distention, or inflammatory changes. Diverticulosis of colon. Status post partial right colectomy. Vascular/Lymphatic: Aortic atherosclerosis. No enlarged abdominal or pelvic lymph nodes. Reproductive: Status post hysterectomy. No adnexal masses. Other: Small umbilical herniation of mesenteric fat identified. Musculoskeletal: Prior fixation of lumbar spine noted. IMPRESSION: 1. No acute abnormality identified in the abdomen and pelvis. 2. Fatty infiltration of liver. 3. Diverticulosis of colon without evidence of acute diverticulitis. 4. Aortic atherosclerosis. Aortic Atherosclerosis (ICD10-I70.0). Electronically Signed   By: WAbelardo DieselM.D.   On: 02/20/2022 13:56   DG Chest Portable 1 View  Result Date: 02/20/2022 CLINICAL DATA:  Vomiting with headache for 2 days EXAM: PORTABLE CHEST 1 VIEW COMPARISON:  10/31/2016 FINDINGS: The heart size and mediastinal contours are within normal limits. Both lungs are clear. The visualized skeletal structures are unremarkable. IMPRESSION: No evidence of active disease. Electronically Signed   By: JJorje GuildM.D.   On: 02/20/2022 09:24   CT Head Wo Contrast  Result Date: 02/20/2022 CLINICAL DATA:  Headache, new or worsening EXAM: CT HEAD WITHOUT CONTRAST TECHNIQUE: Contiguous axial images were obtained from the base of the skull through the vertex without intravenous contrast. RADIATION DOSE REDUCTION: This exam was performed according to the departmental dose-optimization program which includes automated exposure control, adjustment of the mA and/or kV according to patient size and/or use of iterative reconstruction technique. COMPARISON:  None Available. FINDINGS: Brain: No evidence of acute infarction, hemorrhage, hydrocephalus,  extra-axial collection or mass lesion/mass effect. Brain atrophy especially with widening of the sylvian and parieto-occipital sulci. Confluent chronic small vessel ischemia in the deep cerebral white matter. Vascular: No hyperdense vessel or unexpected calcification. Skull: Normal. Negative for fracture or focal lesion. Sinuses/Orbits: No acute finding.  Bilateral cataract resection IMPRESSION: 1. No specific cause for headache.  No acute finding. 2. Atrophy and chronic small vessel disease. Electronically Signed   By: JJorje GuildM.D.   On: 02/20/2022 09:17    Procedures Procedures    Medications Ordered in ED Medications  iohexol (OMNIPAQUE) 300 MG/ML solution 100 mL (100 mLs Intravenous Contrast Given 02/20/22 1326)  sodium chloride 0.9 % bolus 1,000 mL (1,000 mLs Intravenous New Bag/Given 02/20/22 1502)    ED Course/ Medical Decision  Making/ A&P Clinical Course as of 02/20/22 1601  Sat Feb 20, 2022  1014 CBC with Differential(!) [AH]  1014 Resp Panel by RT-PCR (Flu A&B, Covid) Anterior Nasal Swab [AH]  1014 Patient here with multiple complaints including diarrhea, mild headache, chronic nausea and vomiting.  Differential diagnosis includes infectious etiology, ACS, flulike symptoms.  Patient does not sound like she is having vertigo or new onset ataxia suggestive of stroke. [AH]  1015 I ordered, visualized and interpreted CT head and chest x-ray both of which showed no acute findings. [AH]  1049 Troponin I (High Sensitivity) [AH]  1050 Lipase, blood [AH]  1421 CT ABDOMEN PELVIS W CONTRAST No acute findings on CT abdomen and pelvis I visualized and interpreted results [AH]  1440 Orthostatic vital signs negative [AH]    Clinical Course User Index [AH] Margarita Mail, PA-C                           Medical Decision Making This patient presents to the ED for concern of weakness, n/v/d, this involves an extensive number of treatment options, and is a complaint that carries with it  a high risk of complications and morbidity.  The differential diagnosis of weakness includes but is not limited to neurologic causes (GBS, myasthenia gravis, CVA, MS, ALS, transverse myelitis, spinal cord injury, CVA, botulism, ) and other causes: ACS, Arrhythmia, syncope, orthostatic hypotension, sepsis, hypoglycemia, electrolyte disturbance, hypothyroidism, respiratory failure, symptomatic anemia, dehydration, heat injury, polypharmacy, malignancy.     Co morbidities that complicate the patient evaluation       Legal blindness with retinitis pigmentosa   Additional history obtained:  Additional history obtained from EMS at bedside   Lab Tests:  I Ordered, and personally interpreted labs.  The pertinent results include: CBC without elevated white blood cell count, negative COVID and influenza screening, troponin negative x2 CMP with mildly elevated blood glucose of significant U, lipase within normal limits   Imaging Studies ordered:  I ordered imaging studies including chest x-ray, CT head, CT abdomen and pelvis I independently visualized and interpreted imaging which showed no acute findings I agree with the radiologist interpretation   Cardiac Monitoring:       The patient was maintained on a cardiac monitor.  I personally viewed and interpreted the cardiac monitored which showed an underlying rhythm of: Sinus rhythm   Medicines ordered and prescription drug management:  I ordered medication including fluid for weakness Reevaluation of the patient after these medicines showed that the patient stayed the same I have reviewed the patients home medicines and have made adjustments as needed        Consultations Obtained:  I requested consultation with the hospitalist, Dr. Sarajane Jews,  and discussed lab and imaging findings as well as pertinent plan - they recommend: Observation    Problem List / ED Course:       (N30.01) Acute cystitis with hematuria  (primary  encounter diagnosis)  (R53.1) Weakness generalized  (R26.2) Unable to ambulate     Reevaluation:  After the interventions noted above, I reevaluated the patient and found that they have :stayed the same   Social Determinants of Health:       Elderly, legally blind, lives by herself, too weak to stand   Dispostion:  After consideration of the diagnostic results and the patients response to treatment, I feel that the patent would benefit from admission.    Amount and/or Complexity of Data Reviewed Labs: ordered.  Decision-making details documented in ED Course. Radiology: ordered. Decision-making details documented in ED Course. ECG/medicine tests: ordered.  Risk Prescription drug management. Decision regarding hospitalization.          Final Clinical Impression(s) / ED Diagnoses Final diagnoses:  Acute cystitis with hematuria  Weakness generalized  Unable to ambulate    Rx / DC Orders ED Discharge Orders     None         Margarita Mail, PA-C 02/20/22 1601    Ezequiel Essex, MD 02/21/22 [phone]

## 2022-02-20 NOTE — ED Notes (Signed)
Pt states her head felt heavy and felt as though her balance was off upon standing. RN made aware

## 2022-02-20 NOTE — Progress Notes (Signed)
Spiritual Care consult to provide support for this patient. Spent significant time allowing her to just talk. She was very appreciative of the visit.

## 2022-02-20 NOTE — ED Triage Notes (Signed)
EMS reports from home, c/o Headache x 2 days, NVD and dizziness x 3 months.   BP 190/100 HR 90 RR 16 Sp02 94 Ra

## 2022-02-20 NOTE — Plan of Care (Signed)
  Problem: Clinical Measurements: Goal: Will remain free from infection Outcome: Progressing   Problem: Pain Managment: Goal: General experience of comfort will improve Outcome: Progressing   Problem: Safety: Goal: Ability to remain free from injury will improve Outcome: Progressing   

## 2022-02-21 DIAGNOSIS — R197 Diarrhea, unspecified: Secondary | ICD-10-CM

## 2022-02-21 DIAGNOSIS — Z1152 Encounter for screening for COVID-19: Secondary | ICD-10-CM | POA: Diagnosis not present

## 2022-02-21 DIAGNOSIS — Z91013 Allergy to seafood: Secondary | ICD-10-CM | POA: Diagnosis not present

## 2022-02-21 DIAGNOSIS — Z85038 Personal history of other malignant neoplasm of large intestine: Secondary | ICD-10-CM | POA: Diagnosis not present

## 2022-02-21 DIAGNOSIS — Z882 Allergy status to sulfonamides status: Secondary | ICD-10-CM | POA: Diagnosis not present

## 2022-02-21 DIAGNOSIS — E785 Hyperlipidemia, unspecified: Secondary | ICD-10-CM | POA: Diagnosis present

## 2022-02-21 DIAGNOSIS — H548 Legal blindness, as defined in USA: Secondary | ICD-10-CM | POA: Diagnosis present

## 2022-02-21 DIAGNOSIS — R112 Nausea with vomiting, unspecified: Secondary | ICD-10-CM | POA: Diagnosis not present

## 2022-02-21 DIAGNOSIS — H9190 Unspecified hearing loss, unspecified ear: Secondary | ICD-10-CM | POA: Diagnosis present

## 2022-02-21 DIAGNOSIS — Z79899 Other long term (current) drug therapy: Secondary | ICD-10-CM | POA: Diagnosis not present

## 2022-02-21 DIAGNOSIS — E876 Hypokalemia: Secondary | ICD-10-CM | POA: Diagnosis present

## 2022-02-21 DIAGNOSIS — R531 Weakness: Secondary | ICD-10-CM | POA: Diagnosis present

## 2022-02-21 DIAGNOSIS — R1084 Generalized abdominal pain: Secondary | ICD-10-CM

## 2022-02-21 DIAGNOSIS — Z885 Allergy status to narcotic agent status: Secondary | ICD-10-CM | POA: Diagnosis not present

## 2022-02-21 DIAGNOSIS — G629 Polyneuropathy, unspecified: Secondary | ICD-10-CM | POA: Diagnosis present

## 2022-02-21 DIAGNOSIS — H3552 Pigmentary retinal dystrophy: Secondary | ICD-10-CM | POA: Diagnosis present

## 2022-02-21 DIAGNOSIS — Z981 Arthrodesis status: Secondary | ICD-10-CM | POA: Diagnosis not present

## 2022-02-21 LAB — BASIC METABOLIC PANEL
Anion gap: 6 (ref 5–15)
BUN: 17 mg/dL (ref 8–23)
CO2: 29 mmol/L (ref 22–32)
Calcium: 9.2 mg/dL (ref 8.9–10.3)
Chloride: 106 mmol/L (ref 98–111)
Creatinine, Ser: 0.65 mg/dL (ref 0.44–1.00)
GFR, Estimated: >60 mL/min (ref >60)
Glucose, Bld: 92 mg/dL (ref 70–99)
Potassium: 3.3 mmol/L — ABNORMAL LOW (ref 3.5–5.1)
Sodium: 141 mmol/L (ref 135–145)

## 2022-02-21 LAB — PHOSPHORUS: Phosphorus: 3.2 mg/dL (ref 2.5–4.6)

## 2022-02-21 LAB — MAGNESIUM: Magnesium: 2.3 mg/dL (ref 1.7–2.4)

## 2022-02-21 MED ORDER — POTASSIUM CHLORIDE 10 MEQ/100ML IV SOLN
10.0000 meq | INTRAVENOUS | Status: AC
  Administered 2022-02-21 (×4): 10 meq via INTRAVENOUS
  Filled 2022-02-21 (×4): qty 100

## 2022-02-21 MED ORDER — POTASSIUM CHLORIDE CRYS ER 20 MEQ PO TBCR
40.0000 meq | EXTENDED_RELEASE_TABLET | Freq: Once | ORAL | Status: AC
  Administered 2022-02-21: 40 meq via ORAL
  Filled 2022-02-21: qty 2

## 2022-02-21 NOTE — TOC Initial Note (Signed)
Transition of Care [facility]) - Initial/Assessment Note    Patient Details  Name: Kathryn Romero MRN: [id number] Date of Birth: 10-Jan-1936  [facility] of [facility]) CM/SW Contact:    Vassie Moselle, LCSW Phone Number: 02/21/2022, 2:50 PM  Clinical Narrative:                 Met with pt who shares she lives at home alone. She currently receives personal care services through [facility] however, shares that they have not been coming to her house and she is interested in switching agencies. As pt has to private pay for these services the hospital is unable to assist in changing agencies however, additional home health agency information has been added to pt's chart.  Pt reports using a rollator at home and denies further DME needs.  She states that her brother orders food/groceries for her that are delivered to her home and reports having no issues with food. She does state she has issues with getting to medical appointments as she does not have transportation. Pt was agreeable to having resources for transportation added to her chart. Transportation and home health resources have been added to pt's discharge instructions.   Expected Discharge Plan: Home/Self Care Barriers to Discharge: No Barriers Identified   Patient Goals and CMS Choice Patient states their goals for this hospitalization and ongoing recovery are:: To return home CMS Medicare.gov Compare Post Acute Care list provided to:: Patient Choice offered to / list presented to : Patient  Expected Discharge Plan and Services Expected Discharge Plan: Home/Self Care In-house Referral: NA Discharge Planning Services: CM Consult Post Acute Care Choice: NA Living arrangements for the past 2 months: Single Family Home                 DME Arranged: N/A DME Agency: NA       HH Arranged: NA          Prior Living Arrangements/Services Living arrangements for the past 2 months: Single Family Home Lives with:: Self Patient  language and need for interpreter reviewed:: Yes Do you feel safe going back to the place where you live?: Yes      Need for Family Participation in Patient Care: No (Comment) Care giver support system in place?: Yes (comment) (Has PCA through [facility]) Current home services: Other (comment) (PCS) Criminal Activity/Legal Involvement Pertinent to Current Situation/Hospitalization: No - Comment as needed  Activities of Daily Living Home Assistive Devices/Equipment: Hearing aid, Walker (specify type) ADL Screening (condition at time of admission) Patient's cognitive ability adequate to safely complete daily activities?: No Is the patient deaf or have difficulty hearing?: Yes Does the patient have difficulty seeing, even when wearing glasses/contacts?: Yes Does the patient have difficulty concentrating, remembering, or making decisions?: No Patient able to express need for assistance with ADLs?: Yes Does the patient have difficulty dressing or bathing?: Yes Independently performs ADLs?: No Communication: Independent Dressing (OT): Needs assistance Is this a change from baseline?: Pre-admission baseline Grooming: Needs assistance Is this a change from baseline?: Pre-admission baseline Feeding: Independent Bathing: Needs assistance Is this a change from baseline?: Pre-admission baseline Toileting: Needs assistance Is this a change from baseline?: Pre-admission baseline In/Out Bed: Needs assistance Is this a change from baseline?: Pre-admission baseline Walks in Home: Independent with device (comment) Does the patient have difficulty walking or climbing stairs?: Yes Weakness of Legs: Both Weakness of Arms/Hands: None  Permission Sought/Granted   Permission granted to share information with : No  Emotional Assessment Appearance:: Appears stated age Attitude/Demeanor/Rapport: Engaged Affect (typically observed): Accepting Orientation: : Oriented to Self, Oriented  to Place, Oriented to  Time, Oriented to Situation Alcohol / Substance Use: Not Applicable Psych Involvement: No (comment)  Admission diagnosis:  Weakness generalized [R53.1] Acute cystitis with hematuria [N30.01] Nausea vomiting and diarrhea [R11.2, R19.7] Unable to ambulate [R26.2] Patient Active Problem List   Diagnosis Date Noted   Nausea vomiting and diarrhea 02/20/2022   Hypokalemia 02/20/2022   Legal blindness 02/20/2022   Degenerative lumbar spinal stenosis 01/06/2018   Spondylolisthesis at L4-L5 level 11/14/2015   DOE (dyspnea on exertion) 10/14/2014   Generalized weakness 09/19/2014   Obese 05/20/2014   Nausea with vomiting 04/16/2009   RETINITIS PIGMENTOSA 12/04/2008   GLUCOSE INTOLERANCE 05/09/2007   Dyslipidemia 05/09/2007   ANEMIA-NOS 05/09/2007   ANXIETY 05/09/2007   Essential hypertension 05/09/2007   DIVERTICULOSIS, COLON 05/09/2007   BACK PAIN 05/09/2007   History of malignant neoplasm of large intestine 05/09/2007   ARTHRITIS, TRAUMATIC, UNSPECIFIED SITE 12/17/2006   PCP:  Prince Solian, MD Pharmacy:   [facility] [place] [address] [place] [address] Phone: [phone] Fax: [phone]  [id number] - [place], [place] - [address] AT [employer] [address] [place] [address] Phone: [phone] Fax: [phone]  [id number] [address], [place] AT [address] [address] Phone: [phone] Fax: [phone]     Social Determinants of Health (SDOH) Interventions Food Insecurity Interventions: Intervention Not Indicated Transportation Interventions: Other (Comment) (provided community resources)  Readmission Risk Interventions     No data to display

## 2022-02-21 NOTE — Evaluation (Signed)
Physical Therapy Evaluation Patient Details Name: Kathryn Romero MRN: [id number] DOB: 08-22-1935 Today's Date: 02/21/2022  History of Present Illness  Patient is a 86 year old female who presented to the hospital with abdominal pain and vomiting. patient was found to have hypokalemia.   PMH: legal blindness, colon cancer, anxiety, carpal tunnel syndrome. L2-3 ALIF  Clinical Impression  Pt admitted with above diagnosis.  Pt currently with functional limitations due to the deficits listed below (see PT Problem List). Pt will benefit from skilled PT to increase their independence and safety with mobility to allow discharge to the venue listed below.   Pt reports she has good friends who check on her.  She is legally blind however able to see shapes and shadows.  Pt typically uses rollator at baseline.  Pt assisted from recliner to bed and declined ambulating due to nausea however likely able to ambulate min/guard to supervision based on her ability to transfer.  Pt asking when she can d/c home and awaiting MD visit today.  Pt reports the only help she needs at home is someone to assist with cleaning.        Recommendations for follow up therapy are one component of a multi-disciplinary discharge planning process, led by the attending physician.  Recommendations may be updated based on patient status, additional functional criteria and insurance authorization.  Follow Up Recommendations No PT follow up      Assistance Recommended at Discharge PRN  Patient can return home with the following  Assistance with cooking/housework    Equipment Recommendations None recommended by PT  Recommendations for Other Services       Functional Status Assessment Patient has had a recent decline in their functional status and demonstrates the ability to make significant improvements in function in a reasonable and predictable amount of time.     Precautions / Restrictions Precautions Precautions:  Fall Precaution Comments: legally blind and HOH Restrictions Weight Bearing Restrictions: No      Mobility  Bed Mobility Overal bed mobility: Needs Assistance Bed Mobility: Sit to Supine     Supine to sit: Min guard     General bed mobility comments: multimodal cues for technique    Transfers Overall transfer level: Needs assistance Equipment used: Rolling walker (2 wheels) Transfers: Sit to/from Stand, Bed to chair/wheelchair/BSC Sit to Stand: Min guard   Step pivot transfers: Min guard       General transfer comment: multimodal cues for technique; pt reported nausea and requested only transfer back to bed    Ambulation/Gait                  Stairs            Wheelchair Mobility    Modified Rankin (Stroke Patients Only)       Balance Overall balance assessment: Needs assistance         Standing balance support: No upper extremity supported Standing balance-Leahy Scale: Fair Standing balance comment: at least fair, typically uses rollator at home, also hx of blindness                             Pertinent Vitals/Pain Pain Assessment Pain Assessment: No/denies pain    Home Living Family/patient expects to be discharged to:: Private residence Living Arrangements: Alone Available Help at Discharge: Family Type of Home: House Home Access: Stairs to enter Entrance Stairs-Rails: Psychiatric nurse of Steps: 3   Home Layout: One  level Home Equipment: Rollator (4 wheels)      Prior Function Prior Level of Function : Independent/Modified Independent             Mobility Comments: patient uses rollator at home.  Always has someone with her for her steps into/out of home ADLs Comments: patient reported taking baths when someone is there.     Hand Dominance   Dominant Hand: Right    Extremity/Trunk Assessment   Upper Extremity Assessment Upper Extremity Assessment: Overall WFL for tasks assessed     Lower Extremity Assessment Lower Extremity Assessment: Overall WFL for tasks assessed    Cervical / Trunk Assessment Cervical / Trunk Assessment: Normal  Communication   Communication: HOH  Cognition Arousal/Alertness: Awake/alert Behavior During Therapy: WFL for tasks assessed/performed Overall Cognitive Status: Within Functional Limits for tasks assessed                                          General Comments      Exercises     Assessment/Plan    PT Assessment Patient needs continued PT services  PT Problem List Decreased activity tolerance;Decreased mobility       PT Treatment Interventions Gait training;DME instruction;Therapeutic exercise;Balance training;Functional mobility training;Therapeutic activities;Patient/family education    PT Goals (Current goals can be found in the Care Plan section)  Acute Rehab PT Goals PT Goal Formulation: With patient Time For Goal Achievement: 03/07/22 Potential to Achieve Goals: Good    Frequency Min 3X/week     Co-evaluation               AM-PAC PT "6 Clicks" Mobility  Outcome Measure Help needed turning from your back to your side while in a flat bed without using bedrails?: A Little Help needed moving from lying on your back to sitting on the side of a flat bed without using bedrails?: A Little Help needed moving to and from a bed to a chair (including a wheelchair)?: A Little Help needed standing up from a chair using your arms (e.g., wheelchair or bedside chair)?: A Little Help needed to walk in hospital room?: A Little Help needed climbing 3-5 steps with a railing? : A Little 6 Click Score: 18    End of Session   Activity Tolerance: Patient tolerated treatment well Patient left: in bed;with call bell/phone within reach;with bed alarm set   PT Visit Diagnosis: Difficulty in walking, not elsewhere classified (R26.2)    Time: 8563-1497 PT Time Calculation (min) (ACUTE ONLY): 13  min   Charges:   PT Evaluation $PT Eval Low Complexity: 1 Low         Kati PT, DPT Physical Therapist Acute Rehabilitation Services Preferred contact method: Secure Chat Weekend Pager Only: [phone] Office: [phone]   Myrtis Hopping [place] 02/21/2022, 12:07 PM

## 2022-02-21 NOTE — Evaluation (Addendum)
Occupational Therapy Evaluation Patient Details Name: Dietrich Ke MRN: [id number] DOB: Mar 06, 1936 Today's Date: 02/21/2022   History of Present Illness Patient is a 86 year old female who presented to the hospital with abdominal pain and vomitting. patient was found to have hypokalemia.   PMH: legal blindness, colon cancer, anxiety, carpal tunnel syndrome. L2-3 ALIF   Clinical Impression   Patient is a 89 y ear old female who was admitted for above. Patient lives at home alone with cleaning lady and PRN support from brother for groceries. Patient was min guard for ADL tasks with extensive cues for visual deficits. Patient is motivated to return back to house. Patient would benefit from caregiver support at home for safety and IADLs with current visual deficits. Patient would continue to benefit from skilled OT services at this time while admitted a to address noted deficits in order to improve overall safety and independence in ADLs.       Recommendations for follow up therapy are one component of a multi-disciplinary discharge planning process, led by the attending physician.  Recommendations may be updated based on patient status, additional functional criteria and insurance authorization.   Follow Up Recommendations  No OT follow up    Assistance Recommended at Discharge Frequent or constant Supervision/Assistance  Patient can return home with the following A little help with walking and/or transfers;A little help with bathing/dressing/bathroom;Assistance with cooking/housework;Direct supervision/assist for financial management;Assist for transportation;Direct supervision/assist for medications management;Help with stairs or ramp for entrance    Functional Status Assessment  Patient has had a recent decline in their functional status and demonstrates the ability to make significant improvements in function in a reasonable and predictable amount of time.  Equipment Recommendations   None recommended by OT    Recommendations for Other Services       Precautions / Restrictions Precautions Precautions: Fall Precaution Comments: legally blind and HOH Restrictions Weight Bearing Restrictions: No      Mobility Bed Mobility Overal bed mobility: Needs Assistance Bed Mobility: Supine to Sit     Supine to sit: Min assist     General bed mobility comments: with cues for hand placement and sequencing to accomidate for visual deficits             ADL either performed or assessed with clinical judgement   ADL Overall ADL's : Needs assistance/impaired Eating/Feeding: Set up;Sitting Eating/Feeding Details (indicate cue type and reason): in recliner Grooming: Set up;Sitting   Upper Body Bathing: Set up;Sitting   Lower Body Bathing: Min guard;Sitting/lateral leans;Sit to/from stand   Upper Body Dressing : Set up;Sitting   Lower Body Dressing: Min guard;Sit to/from stand;Sitting/lateral leans   Toilet Transfer: Min guard;Ambulation;Rolling walker (2 wheels) Toileting- Clothing Manipulation and Hygiene: Min guard;Sit to/from stand        Vision Baseline Vision/History: 2 Legally blind Additional Comments: can see outlines            Pertinent Vitals/Pain Pain Assessment Pain Assessment: No/denies pain     Hand Dominance Right   Extremity/Trunk Assessment Upper Extremity Assessment Upper Extremity Assessment: Overall WFL for tasks assessed   Lower Extremity Assessment Lower Extremity Assessment: Defer to PT evaluation   Cervical / Trunk Assessment Cervical / Trunk Assessment: Normal   Communication Communication Communication: HOH   Cognition Arousal/Alertness: Awake/alert Behavior During Therapy: WFL for tasks assessed/performed Overall Cognitive Status: Within Functional Limits for tasks assessed  General Comments       Exercises     Shoulder Instructions      Home Living  Family/patient expects to be discharged to:: Private residence Living Arrangements: Alone Available Help at Discharge: Family Type of Home: House Home Access: Stairs to enter [employer] of Steps: 3 has someone with her Entrance Stairs-Rails: Right;Left Home Layout: One level     Bathroom Shower/Tub: Tub/shower unit         Home Equipment: Rollator (4 wheels)          Prior Functioning/Environment Prior Level of Function : Independent/Modified Independent             Mobility Comments: patient uses rollator at home. ADLs Comments: patient reported taking baths when someone is there.        OT Problem List: Decreased activity tolerance;Impaired balance (sitting and/or standing);Decreased safety awareness;Decreased knowledge of precautions;Decreased knowledge of use of DME or AE      OT Treatment/Interventions: Self-care/ADL training;Therapeutic exercise;Neuromuscular education;Energy conservation;DME and/or AE instruction;Balance training;Patient/family education;Therapeutic activities    OT Goals(Current goals can be found in the care plan section) Acute Rehab OT Goals Patient Stated Goal: to get back home soon OT Goal Formulation: With patient Time For Goal Achievement: 03/07/22 Potential to Achieve Goals: Fair ADL Goals Pt Will Perform Lower Body Dressing: with modified independence;sit to/from stand Pt Will Transfer to Toilet: with supervision;ambulating;regular height toilet Pt Will Perform Toileting - Clothing Manipulation and hygiene: with supervision;sit to/from stand Additional ADL Goal #1: Patient will perform 10 min functional activity or exercise activity as evidence of improving activity tolerance  OT Frequency: Min 2X/week    Co-evaluation              AM-PAC OT "6 Clicks" Daily Activity     Outcome Measure Help from another person eating meals?: A Little Help from another person taking care of personal grooming?: A Little Help  from another person toileting, which includes using toliet, bedpan, or urinal?: A Little Help from another person bathing (including washing, rinsing, drying)?: A Little Help from another person to put on and taking off regular upper body clothing?: None Help from another person to put on and taking off regular lower body clothing?: A Little 6 Click Score: 19   End of Session Equipment Utilized During Treatment: Rolling walker (2 wheels) Nurse Communication: Mobility status  Activity Tolerance: Patient tolerated treatment well Patient left: in chair;with call bell/phone within reach;with chair alarm set  OT Visit Diagnosis: Unsteadiness on feet (R26.81);Other abnormalities of gait and mobility (R26.89);Low vision, both eyes (H54.2)                Time: 9093-1121 OT Time Calculation (min): 10 min Charges:  OT General Charges $OT Visit: 1 Visit OT Evaluation $OT Eval Low Complexity: 1 Low OT Treatments $Self Care/Home Management : 8-22 mins  Rennie Plowman, MS Acute Rehabilitation Department Office# [phone]   Feliz Beam Jonita Hirota 02/21/2022, 10:06 AM

## 2022-02-21 NOTE — Progress Notes (Signed)
  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  Initial work-up was unrevealing and plans were made for observation for further evaluation.  Assessment and Plan: Nausea and vomiting with central abdominal pain, acute on intermittent -- CT abdomen pelvis unrevealing, LFTs unremarkable, lipase within normal limits.  Exam benign. -- Suspect pain is from vomiting. -- Etiology of intermittent nausea and vomiting unclear.  Does report of history of neuropathy, will pursue gastric emptying study. -- Antiemetics, advance diet, continue supportive care   Hypokalemia -- Secondary to nausea and vomiting.  Replete.   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  She does have an aide of some kind that comes into her sister. -- TOC consultation to review available resources -- Assisted living in the future may be a good option. For now patient wants to return home  Likely home 10/16.      Subjective:  Feels better, but still has nausea and did vomit once Overall tolerating liquids  Overnight Events No PT/OT follow-up  Physical Exam: Vitals:   02/20/22 2137 02/21/22 0125 02/21/22 0537 02/21/22 1417  BP: (!) 141/105 (!) 141/44 (!) 179/79 134/84  Pulse: 68 67 75 72  Resp: '18 14 16 16  '$ Temp: 98.1 F (36.7 C) 99.3 F (37.4 C) 98.5 F (36.9 C) (!) 97.4 F (36.3 C)  TempSrc: Oral Oral Oral Oral  SpO2: 96% 96% 96% 96%  Weight:      Height:       Physical Exam Vitals reviewed.  Constitutional:       General: She is not in acute distress.    Appearance: She is not ill-appearing or toxic-appearing.  Cardiovascular:     Rate and Rhythm: Normal rate and regular rhythm.     Heart sounds: No murmur heard. Pulmonary:     Effort: Pulmonary effort is normal. No respiratory distress.     Breath sounds: No wheezing, rhonchi or rales.  Abdominal:     Palpations: Abdomen is soft.     Tenderness: There is no abdominal tenderness.  Musculoskeletal:     Right lower leg: No edema.     Left lower leg: No edema.  Neurological:     Mental Status: She is alert.  Psychiatric:        Mood and Affect: Mood normal.        Behavior: Behavior normal.     Data Reviewed: K+ 3.3 Mg2+ WNL Phos WNL  Family Communication: none  Disposition: Status is: Observation   Planned Discharge Destination: Home    Time spent: 20 minutes  Author: Murray Hodgkins, MD 02/21/2022 2:39 PM  For on call review [email or web address].

## 2022-02-21 NOTE — Progress Notes (Signed)
Occupational Therapy Treatment Patient Details Name: Kathryn Romero MRN: [id number] DOB: 1936/03/21 Today's Date: 02/21/2022   History of present illness Patient is a 86 year old female who presented to the hospital with abdominal pain and vomitting. patient was found to have hypokalemia.   PMH: legal blindness, colon cancer, anxiety, carpal tunnel syndrome. L2-3 ALIF   OT comments  Patient requested to walk to bathroom when therapist popped back into room to retrieve gait belt. Patient was min guard with consistent cues for directions to avoid obstacles in room to bathroom. Patient was able to navigate with supervision back to recliner after toileting tasks. Patient would benefit from caregiver support at home for safety and IADL tasks at time of d/c. Patient's discharge plan remains appropriate at this time. OT will continue to follow acutely.     Recommendations for follow up therapy are one component of a multi-disciplinary discharge planning process, led by the attending physician.  Recommendations may be updated based on patient status, additional functional criteria and insurance authorization.    Follow Up Recommendations  No OT follow up    Assistance Recommended at Discharge Frequent or constant Supervision/Assistance  Patient can return home with the following  A little help with walking and/or transfers;A little help with bathing/dressing/bathroom;Assistance with cooking/housework;Direct supervision/assist for financial management;Assist for transportation;Direct supervision/assist for medications management;Help with stairs or ramp for entrance   Equipment Recommendations  None recommended by OT    Recommendations for Other Services      Precautions / Restrictions Precautions Precautions: Fall Precaution Comments: legally blind and HOH Restrictions Weight Bearing Restrictions: No       Mobility Bed Mobility Overal bed mobility: Needs Assistance Bed Mobility:  Supine to Sit     Supine to sit: Min assist     General bed mobility comments: with cues for hand placement and sequencing to accomidate for visual deficits    Transfers                         Balance                                           ADL either performed or assessed with clinical judgement   ADL Overall ADL's : Needs assistance/impaired Eating/Feeding: Set up;Sitting Eating/Feeding Details (indicate cue type and reason): in recliner Grooming: Set up;Sitting   Upper Body Bathing: Set up;Sitting   Lower Body Bathing: Min guard;Sitting/lateral leans;Sit to/from stand   Upper Body Dressing : Set up;Sitting   Lower Body Dressing: Min guard;Sit to/from stand;Sitting/lateral leans   Toilet Transfer: Min guard;Ambulation;Rolling walker (2 wheels) Toilet Transfer Details (indicate cue type and reason): with sequncing cues to navigate to and from the bathroom to avoid obstacles in room with visual deficits. Toileting- Water quality scientist and Hygiene: Min guard;Sit to/from stand Toileting - Clothing Manipulation Details (indicate cue type and reason): to complete hygiene and clothing management tasks.            Extremity/Trunk Assessment Upper Extremity Assessment Upper Extremity Assessment: Overall WFL for tasks assessed   Lower Extremity Assessment Lower Extremity Assessment: Defer to PT evaluation   Cervical / Trunk Assessment Cervical / Trunk Assessment: Normal    Vision Baseline Vision/History: 2 Legally blind Additional Comments: can see outlines   Perception     Praxis      Cognition Arousal/Alertness: Awake/alert Behavior During  Therapy: WFL for tasks assessed/performed Overall Cognitive Status: Within Functional Limits for tasks assessed                                          Exercises      Shoulder Instructions       General Comments      Pertinent Vitals/ Pain       Pain  Assessment Pain Assessment: No/denies pain  Home Living Family/patient expects to be discharged to:: Private residence Living Arrangements: Alone Available Help at Discharge: Family Type of Home: House Home Access: Stairs to enter [employer] of Steps: 3 has someone with her Entrance Stairs-Rails: Right;Left Home Layout: One level     Bathroom Shower/Tub: Tub/shower unit         Home Equipment: Rollator (4 wheels)          Prior Functioning/Environment              Frequency  Min 2X/week        Progress Toward Goals  OT Goals(current goals can now be found in the care plan section)  Progress towards OT goals: Progressing toward goals  Acute Rehab OT Goals Patient Stated Goal: to get back home soon OT Goal Formulation: With patient Time For Goal Achievement: 03/07/22 Potential to Achieve Goals: Fair ADL Goals Pt Will Perform Lower Body Dressing: with modified independence;sit to/from stand Pt Will Transfer to Toilet: with supervision;ambulating;regular height toilet Pt Will Perform Toileting - Clothing Manipulation and hygiene: with supervision;sit to/from stand Additional ADL Goal #1: Patient will perform 10 min functional activity or exercise activity as evidence of improving activity tolerance  Plan Discharge plan remains appropriate    Co-evaluation                 AM-PAC OT "6 Clicks" Daily Activity     Outcome Measure   Help from another person eating meals?: A Little Help from another person taking care of personal grooming?: A Little Help from another person toileting, which includes using toliet, bedpan, or urinal?: A Little Help from another person bathing (including washing, rinsing, drying)?: A Little Help from another person to put on and taking off regular upper body clothing?: None Help from another person to put on and taking off regular lower body clothing?: A Little 6 Click Score: 19    End of Session Equipment  Utilized During Treatment: Rolling walker (2 wheels)  OT Visit Diagnosis: Unsteadiness on feet (R26.81);Other abnormalities of gait and mobility (R26.89);Low vision, both eyes (H54.2)   Activity Tolerance Patient tolerated treatment well   Patient Left in chair;with call bell/phone within reach;with chair alarm set   Nurse Communication Mobility status        Time: 6213-0865 OT Time Calculation (min): 10 min  Charges: OT General Charges $OT Visit: 1 Visit OT Treatments $Self Care/Home Management : 8-22 mins  Rennie Plowman, MS Acute Rehabilitation Department Office# [phone]   Kathryn Romero 02/21/2022, 10:02 AM

## 2022-02-22 ENCOUNTER — Encounter: Payer: Self-pay | Admitting: *Deleted

## 2022-02-22 ENCOUNTER — Inpatient Hospital Stay (HOSPITAL_COMMUNITY): Payer: PPO

## 2022-02-22 DIAGNOSIS — R109 Unspecified abdominal pain: Secondary | ICD-10-CM

## 2022-02-22 DIAGNOSIS — R197 Diarrhea, unspecified: Secondary | ICD-10-CM

## 2022-02-22 MED ORDER — TECHNETIUM TC 99M SULFUR COLLOID
2.1000 | Freq: Once | INTRAVENOUS | Status: AC | PRN
  Administered 2022-02-22: 2.1 via INTRAVENOUS

## 2022-02-22 NOTE — Discharge Summary (Addendum)
Physician Discharge Summary   Patient: Kathryn Romero MRN: [id number] DOB: [birth year]  Admit date:     02/20/2022  Discharge date: 02/22/22  Discharge Physician: Murray Hodgkins   PCP: Prince Solian, MD   Recommendations at discharge:  Nausea and vomiting with central abdominal pain, acute on intermittent -- CT abdomen pelvis unrevealing, LFTs unremarkable, lipase within normal limits.  Exam benign. Gastric emptying study was normal.  Has outpatient follow-up with Dr. Henrene Pastor in the near future.  Discharge Diagnoses: Principal Problem:   Nausea & vomiting Active Problems:   RETINITIS PIGMENTOSA   Generalized weakness   Hypokalemia   Legal blindness   Diarrhea   Abdominal pain  Resolved Problems:   * No resolved hospital problems. 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  Initial work-up was unrevealing and plans were made for observation for further evaluation.  Condition spontaneously improved.  Gastric emptying study was negative.  Tolerating diet.  Plan for discharge home with outpatient follow-up with GI which is already arranged.  Nausea and vomiting with central abdominal pain, acute on intermittent -- CT abdomen pelvis unrevealing, LFTs unremarkable, lipase within normal limits.  Exam benign. -- Suspect pain is from vomiting. -- Etiology of intermittent nausea and vomiting unclear.  Does report of history of neuropathy gastric emptying study was normal.  Has outpatient follow-up with Dr. Henrene Pastor in the near future.   Hypokalemia -- Secondary to nausea and vomiting.  Repleted.   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   She does have an aide of some kind that comes into her sister. -- TOC consultation appreciated, additional resources were given to the patient.      Consultants:  None  Procedures performed:  None   Disposition: Home Diet recommendation:  Regular diet DISCHARGE MEDICATION: Allergies as of 02/22/2022       Reactions   Codeine Shortness Of Breath   Hydromet [hydrocodone Bit-homatrop Mbr] Shortness Of Breath, Other (See Comments)   Wheezing   Fish Allergy Other (See Comments)   Burning Sensation and Headache   Morphine Other (See Comments)   "feels funny"   Sulfa Antibiotics Nausea And Vomiting           Medication List     STOP taking these medications    cephALEXin 500 MG capsule Commonly known as: KEFLEX   loratadine 10 MG tablet Commonly known as: CLARITIN   ondansetron 4 MG disintegrating tablet Commonly known as: ZOFRAN-ODT       TAKE these medications    Alpha-Lipoic Acid 100 MG Caps Take 100 mg by mouth daily.   AMBULATORY NON FORMULARY MEDICATION 2 capsules in the morning and at bedtime. Medication Name: Arthroflex   AMBULATORY NON FORMULARY MEDICATION 2 capsules in the morning and at bedtime. Medication Name: ART supplement   AMBULATORY NON FORMULARY MEDICATION 2 capsules daily. Medication Name: Martyn Ehrich COMPLEX   cyanocobalamin 1000 MCG tablet Take 1,000 mcg by mouth daily.   magnesium 30 MG tablet Take 30 mg by mouth daily.   Vitamin D3 75 MCG (3000 UT) Tabs Take 3,000 Units by mouth daily.        Follow-up Information     [name], [name], MD Follow up in 1  month(s).   Specialty: Internal Medicine Contact information: [place] [phone]               Feels ok Ate some dinner last night  Discharge Exam: Filed Weights   02/20/22 1718  Weight: 81.6 kg   Physical Exam Vitals reviewed.  Constitutional:      General: She is not in acute distress.    Appearance: She is not  ill-appearing or toxic-appearing.  Cardiovascular:     Rate and Rhythm: Normal rate and regular rhythm.  Pulmonary:     Effort: Pulmonary effort is normal. No respiratory distress.     Breath sounds: No wheezing or rales.  Abdominal:     General: There is no distension.     Palpations: Abdomen is soft.     Tenderness: There is no abdominal tenderness.  Neurological:     Mental Status: She is alert.  Psychiatric:        Mood and Affect: Mood normal.        Behavior: Behavior normal.      Condition at discharge: good  The results of significant diagnostics from this hospitalization (including imaging, microbiology, ancillary and laboratory) are listed below for reference.   Imaging Studies: NM GASTRIC EMPTYING  Result Date: 02/22/2022 CLINICAL DATA:  Nausea and vomiting. Previous history of colon cancer. EXAM: NUCLEAR MEDICINE GASTRIC EMPTYING SCAN TECHNIQUE: After oral ingestion of radiolabeled meal, sequential abdominal images were obtained for 120 minutes. Residual percentage of activity remaining within the stomach was calculated at 60 and 120 minutes. RADIOPHARMACEUTICALS:  CT abdomen and pelvis mCi Tc-49msulfur colloid in standardized meal COMPARISON:  CT abdomen and pelvis 02/20/2022 FINDINGS: Expected location of the stomach in the left upper quadrant. Ingested meal empties the stomach gradually over the course of the study with 8% retention at 60 min and 3% retention at 120 min (normal retention less than 30% at a 120 min). IMPRESSION: Normal gastric emptying study. Electronically Signed   By: RYvonne KendallM.D.   On: 02/22/2022 12:43   CT ABDOMEN PELVIS W CONTRAST  Result Date: 02/20/2022 CLINICAL DATA:  Nausea and vomiting for 3 months. EXAM: CT ABDOMEN AND PELVIS WITH CONTRAST TECHNIQUE: Multidetector CT imaging of the abdomen and pelvis was performed using the standard protocol following bolus administration of intravenous contrast. RADIATION DOSE REDUCTION: This exam was  performed according to the departmental dose-optimization program which includes automated exposure control, adjustment of the mA and/or kV according to patient size and/or use of iterative reconstruction technique. CONTRAST:  1083mOMNIPAQUE IOHEXOL 300 MG/ML  SOLN COMPARISON:  April 03, 2021 FINDINGS: Lower chest: Mild atelectasis of posterior lung bases are noted. The heart size is normal. Minimal pericardial fluid is noted. Hepatobiliary: Diffuse low density of the liver without focal liver lesion. The gallbladder and biliary tree are normal. Pancreas: Unremarkable. No pancreatic ductal dilatation or surrounding inflammatory changes. Spleen: Normal in size without focal abnormality. Adrenals/Urinary Tract: Adrenal glands are unremarkable. No renal calculi or hydronephrosis identified bilaterally. 8 mm simple cyst is identified in the midpole left kidney. No follow-up is recommended. No focal right renal lesion is noted. Bladder is unremarkable. Stomach/Bowel: Stomach is within normal limits. The appendix is not seen but no inflammation is noted around cecum. No evidence of bowel wall thickening, distention, or inflammatory changes. Diverticulosis of colon. Status post partial right colectomy. Vascular/Lymphatic: Aortic atherosclerosis. No enlarged abdominal or pelvic lymph nodes. Reproductive: Status post hysterectomy. No adnexal masses. Other: Small umbilical herniation  of mesenteric fat identified. Musculoskeletal: Prior fixation of lumbar spine noted. IMPRESSION: 1. No acute abnormality identified in the abdomen and pelvis. 2. Fatty infiltration of liver. 3. Diverticulosis of colon without evidence of acute diverticulitis. 4. Aortic atherosclerosis. Aortic Atherosclerosis (ICD10-I70.0). Electronically Signed   By: Abelardo Diesel M.D.   On: 02/20/2022 13:56   DG Chest Portable 1 View  Result Date: 02/20/2022 CLINICAL DATA:  Vomiting with headache for 2 days EXAM: PORTABLE CHEST 1 VIEW COMPARISON:   10/31/2016 FINDINGS: The heart size and mediastinal contours are within normal limits. Both lungs are clear. The visualized skeletal structures are unremarkable. IMPRESSION: No evidence of active disease. Electronically Signed   By: Jorje Guild M.D.   On: 02/20/2022 09:24   CT Head Wo Contrast  Result Date: 02/20/2022 CLINICAL DATA:  Headache, new or worsening EXAM: CT HEAD WITHOUT CONTRAST TECHNIQUE: Contiguous axial images were obtained from the base of the skull through the vertex without intravenous contrast. RADIATION DOSE REDUCTION: This exam was performed according to the departmental dose-optimization program which includes automated exposure control, adjustment of the mA and/or kV according to patient size and/or use of iterative reconstruction technique. COMPARISON:  None Available. FINDINGS: Brain: No evidence of acute infarction, hemorrhage, hydrocephalus, extra-axial collection or mass lesion/mass effect. Brain atrophy especially with widening of the sylvian and parieto-occipital sulci. Confluent chronic small vessel ischemia in the deep cerebral white matter. Vascular: No hyperdense vessel or unexpected calcification. Skull: Normal. Negative for fracture or focal lesion. Sinuses/Orbits: No acute finding.  Bilateral cataract resection IMPRESSION: 1. No specific cause for headache.  No acute finding. 2. Atrophy and chronic small vessel disease. Electronically Signed   By: Jorje Guild M.D.   On: 02/20/2022 09:17    Microbiology: Results for orders placed or performed during the hospital encounter of 02/20/22  Resp Panel by RT-PCR (Flu A&B, Covid) Anterior Nasal Swab     Status: None   Collection Time: 02/20/22  9:32 AM   Specimen: Anterior Nasal Swab  Result Value Ref Range Status   SARS Coronavirus 2 by RT PCR NEGATIVE NEGATIVE Final    Comment: (NOTE) SARS-CoV-2 target nucleic acids are NOT DETECTED.  The SARS-CoV-2 RNA is generally detectable in upper respiratory specimens  during the acute phase of infection. The lowest concentration of SARS-CoV-2 viral copies this assay can detect is 138 copies/mL. A negative result does not preclude SARS-Cov-2 infection and should not be used as the sole basis for treatment or other patient management decisions. A negative result may occur with  improper specimen collection/handling, submission of specimen other than nasopharyngeal swab, presence of viral mutation(s) within the areas targeted by this assay, and inadequate number of viral copies(<138 copies/mL). A negative result must be combined with clinical observations, patient history, and epidemiological information. The expected result is Negative.  Fact Sheet for Patients:  EntrepreneurPulse.com.au  Fact Sheet for Healthcare Providers:  IncredibleEmployment.be  This test is no t yet approved or cleared by the [place] FDA and  has been authorized for detection and/or diagnosis of SARS-CoV-2 by FDA under an Emergency Use Authorization (EUA). This EUA will remain  in effect (meaning this test can be used) for the duration of the COVID-19 declaration under Section 564(b)(1) of the Act, 21 U.S.C.section 360bbb-3(b)(1), unless the authorization is terminated  or revoked sooner.       Influenza A by PCR NEGATIVE NEGATIVE Final   Influenza B by PCR NEGATIVE NEGATIVE Final    Comment: (NOTE) The Xpert Xpress SARS-CoV-2/FLU/RSV  plus assay is intended as an aid in the diagnosis of influenza from Nasopharyngeal swab specimens and should not be used as a sole basis for treatment. Nasal washings and aspirates are unacceptable for Xpert Xpress SARS-CoV-2/FLU/RSV testing.  Fact Sheet for Patients: EntrepreneurPulse.com.au  Fact Sheet for Healthcare Providers: IncredibleEmployment.be  This test is not yet approved or cleared by the [place] FDA and has been authorized for detection  and/or diagnosis of SARS-CoV-2 by FDA under an Emergency Use Authorization (EUA). This EUA will remain in effect (meaning this test can be used) for the duration of the COVID-19 declaration under Section 564(b)(1) of the Act, 21 U.S.C. section 360bbb-3(b)(1), unless the authorization is terminated or revoked.  Performed at [facility], [place] [address], [place], [place] [address]     Labs: CBC: Recent Labs  Lab 02/20/22 0916  WBC 7.4  NEUTROABS 5.7  HGB 13.1  HCT 40.2  MCV 92.0  PLT 707   Basic Metabolic Panel: Recent Labs  Lab 02/20/22 0916 02/21/22 0527  NA 142 141  K 3.4* 3.3*  CL 106 106  CO2 28 29  GLUCOSE 110* 92  BUN 25* 17  CREATININE 0.72 0.65  CALCIUM 9.5 9.2  MG  --  2.3  PHOS  --  3.2   Liver Function Tests: Recent Labs  Lab 02/20/22 0916  AST 22  ALT 27  ALKPHOS 49  BILITOT 0.7  PROT 7.3  ALBUMIN 4.2   CBG: No results for input(s): "GLUCAP" in the last 168 hours.  Discharge time spent: less than 30 minutes.  Signed: Murray Hodgkins, MD [facility] 02/22/2022

## 2022-02-22 NOTE — TOC Progression Note (Addendum)
Transition of Care [facility]) - Progression Note    Patient Details  Name: Kathryn Romero MRN: [id number] Date of Birth: Jun 09, 1935  Transition of Care [facility]) CM/SW [place], LCSW Phone Number: 02/22/2022, 3:54 PM  Clinical Narrative:    Spoke with pt's sister who shares that this pt needs a caregiver at night. CSW explained to pt's sister that this would be self pay services and as such the hospital does not arrange these. CSW provided pt's sister with additional home health agencies.   Pt has been arranged with home health PT/SW services through [facility].   Update 4:10pm- CSW was contacted by [facility] who shared that this pt is already active with HHPT through their agency. [facility] will continue to provide HHPT and will also add SW services. CSW cancelled services from [facility].    Expected Discharge Plan: Home/Self Care Barriers to Discharge: No Barriers Identified  Expected Discharge Plan and Services Expected Discharge Plan: Home/Self Care In-house Referral: NA Discharge Planning Services: CM Consult Post Acute Care Choice: NA Living arrangements for the past 2 months: Single Family Home Expected Discharge Date: 02/22/22               DME Arranged: N/A DME Agency: NA       HH Arranged: PT, Social Work [employer] Agency: [place] Date [place]: 02/22/22 Time [place]: [id number] Representative spoke with at [place]: Amy   Social Determinants of Health ([place]) Interventions Food Insecurity Interventions: Intervention Not Indicated Transportation Interventions: Other (Comment) (provided community resources)  Readmission Risk Interventions    02/22/2022    8:25 AM  Readmission Risk Prevention Plan  Post Dischage Appt Complete  Medication Screening Complete  Transportation Screening Complete

## 2022-02-22 NOTE — Progress Notes (Signed)
Patient currently off floor for study.

## 2022-02-24 DIAGNOSIS — Z9181 History of falling: Secondary | ICD-10-CM | POA: Diagnosis not present

## 2022-02-24 DIAGNOSIS — H548 Legal blindness, as defined in USA: Secondary | ICD-10-CM | POA: Diagnosis not present

## 2022-02-24 DIAGNOSIS — N3281 Overactive bladder: Secondary | ICD-10-CM | POA: Diagnosis not present

## 2022-02-24 DIAGNOSIS — G8929 Other chronic pain: Secondary | ICD-10-CM | POA: Diagnosis not present

## 2022-02-24 DIAGNOSIS — E785 Hyperlipidemia, unspecified: Secondary | ICD-10-CM | POA: Diagnosis not present

## 2022-02-24 DIAGNOSIS — I10 Essential (primary) hypertension: Secondary | ICD-10-CM | POA: Diagnosis not present

## 2022-02-24 DIAGNOSIS — M1990 Unspecified osteoarthritis, unspecified site: Secondary | ICD-10-CM | POA: Diagnosis not present

## 2022-02-24 DIAGNOSIS — Z87440 Personal history of urinary (tract) infections: Secondary | ICD-10-CM | POA: Diagnosis not present

## 2022-02-24 DIAGNOSIS — R5383 Other fatigue: Secondary | ICD-10-CM | POA: Diagnosis not present

## 2022-02-24 DIAGNOSIS — M48061 Spinal stenosis, lumbar region without neurogenic claudication: Secondary | ICD-10-CM | POA: Diagnosis not present

## 2022-02-24 DIAGNOSIS — Z6833 Body mass index (BMI) 33.0-33.9, adult: Secondary | ICD-10-CM | POA: Diagnosis not present

## 2022-02-24 DIAGNOSIS — G629 Polyneuropathy, unspecified: Secondary | ICD-10-CM | POA: Diagnosis not present

## 2022-02-24 DIAGNOSIS — H8110 Benign paroxysmal vertigo, unspecified ear: Secondary | ICD-10-CM | POA: Diagnosis not present

## 2022-02-24 DIAGNOSIS — F419 Anxiety disorder, unspecified: Secondary | ICD-10-CM | POA: Diagnosis not present

## 2022-02-24 DIAGNOSIS — H3552 Pigmentary retinal dystrophy: Secondary | ICD-10-CM | POA: Diagnosis not present

## 2022-02-26 ENCOUNTER — Ambulatory Visit (INDEPENDENT_AMBULATORY_CARE_PROVIDER_SITE_OTHER): Payer: PPO | Admitting: Nurse Practitioner

## 2022-02-26 ENCOUNTER — Encounter: Payer: Self-pay | Admitting: Nurse Practitioner

## 2022-02-26 VITALS — BP 110/70 | HR 68 | Ht 61.0 in | Wt 170.0 lb

## 2022-02-26 DIAGNOSIS — R1010 Upper abdominal pain, unspecified: Secondary | ICD-10-CM | POA: Diagnosis not present

## 2022-02-26 DIAGNOSIS — K5900 Constipation, unspecified: Secondary | ICD-10-CM | POA: Diagnosis not present

## 2022-02-26 MED ORDER — POLYETHYLENE GLYCOL 3350 17 GM/SCOOP PO POWD: ORAL | 0 refills | Status: DC

## 2022-02-26 MED ORDER — OMEPRAZOLE 40 MG PO CPDR: 40.0000 mg | DELAYED_RELEASE_CAPSULE | Freq: Every day | ORAL | 3 refills | Status: DC

## 2022-02-26 NOTE — Patient Instructions (Addendum)
We have sent the following medications to your pharmacy for you to pick up at your convenience: Omperazole 40 mg every morning, 30 minutes before breakfast.  Please purchase the following medications over the counter and take as directed: Miralax 17 grams (1 capful) dissolved in at least 8 ounces of water/juice twice daily x 3 days, then decrease to Miralax 17 grams ONCE daily thereafter.  _______________________________________________________  If you are age 67 or older, your body mass index should be between 23-30. Your Body mass index is 32.12 kg/m. If this is out of the aforementioned range listed, please consider follow up with your Primary Care Provider.  If you are age 53 or younger, your body mass index should be between 19-25. Your Body mass index is 32.12 kg/m. If this is out of the aformentioned range listed, please consider follow up with your Primary Care Provider.   ________________________________________________________  The  GI providers would like to encourage you to use [facility] to communicate with providers for non-urgent requests or questions.  Due to long hold times on the telephone, sending your provider a message by [facility] may be a faster and more efficient way to get a response.  Please allow 48 business hours for a response.  Please remember that this is for non-urgent requests.  _______________________________________________________  Due to recent changes in healthcare laws, you may see the results of your imaging and laboratory studies on MyChart before your provider has had a chance to review them.  We understand that in some cases there may be results that are confusing or concerning to you. Not all laboratory results come back in the same time frame and the provider may be waiting for multiple results in order to interpret others.  Please give [place] 48 hours in order for your provider to thoroughly review all the results before contacting the office for  clarification of your results.

## 2022-02-26 NOTE — Progress Notes (Signed)
Chief Complaint: Time for colonoscopy and having abdominal   Assessment &  Plan   # 86 yo female with recent hospitalization for N/V and mid to upper abdominal pain with no relationship to eating and some relationship to movement.  CT scan, gastric emptying study and labs unrevealing. N/V have resolved. MSK pain? She does take Aleve about once a week so PUD is a possibility. Pain could also be due in part to constipation Miralax 1 capful in 8 oz water BID for 3 days then daily Trial of Omeprazole 40 mg  before breakfast Will get a condition update when we call her next week   # Remote history of colon cancer s/p post R hemicolectomy in 2004. Given age I don't know that Dr Henrene Pastor will want to proceed with another surveillance colonoscopy but will discuss with him and then let her know .   HPI   Kathryn Romero is a 86 y.o. female known to Dr.  Henrene Pastor with a past medical history significant for colon cancer in 2004 for which she is status post right hemicolectomy , diverticulitis, hepatic steatosis , anxiety,  See PMH /PSH for additional history   Patient was last seen in 2021, at that time related to bowel changes with increased frequency of stools possibly related to herbal supplements.   Her last surveillance colonoscopy was in 2015.  No polyps or cancers.  Advised to have a follow-up colonoscopy in 5 years.  We sent her a recall colonoscopy letter in 2020.   She was hospitalized a few days ago with nausea vomiting and mid to upper abdominal pain .  Her labs were unremarkable.  Gastric emptying study was normal.  CT scan unrevealing.   Interval History:  No longer having vomiting but still having discomfort in mid to upper abdomen. Pain has no relationship to eating. It does get worse if she bends or twists. She says she is constipated. BMs generally consist of a small amount of liquid stool mixed with small pieces of solid stool. She has this type of BM about once a week. She is not  taking anything for her bowels. Not taking any supplements.  Mg+ on home med list but not taking it. She took Metamucil for a while but it didn't help. She doesn't take any rx medications. She doesn't look for blood in stool.   Previous GI Evaluation   2015 surveillance colonoscopy -Evidence for prior ileocolonic surgical anastomosis .  Moderate diverticulosis in the left colon .  Colon mucosa otherwise normal - Follow up colonoscopy in 5 years  Imaging     Labs:     Latest Ref Rng & Units 02/20/2022    9:16 AM 04/03/2021   12:05 PM 12/29/2017   11:29 AM  CBC  WBC 4.0 - 10.5 K/uL 7.4  6.9  4.6   Hemoglobin 12.0 - 15.0 g/dL 13.1  14.0  12.3   Hematocrit 36.0 - 46.0 % 40.2  41.4  38.4   Platelets 150 - 400 K/uL 184  179  201        Latest Ref Rng & Units 02/20/2022    9:16 AM 04/03/2021   12:05 PM 11/27/2015   12:00 AM  Hepatic Function  Total Protein 6.5 - 8.1 g/dL 7.3  7.8    Albumin 3.5 - 5.0 g/dL 4.2  5.0    AST 15 - 41 U/L '22  18  15      ' ALT 0 - 44 U/L 27  20  24      Alk Phosphatase 38 - 126 U/L 49  49  62      Total Bilirubin 0.3 - 1.2 mg/dL 0.7  0.7       This result is from an external source.     Past Medical History:  Diagnosis Date   ANEMIA-NOS    ANXIETY    Aortic atherosclerosis ([place])    Arthritis    Cancer ([place]) 2005   colon cancer   Chronic back pain    spondylolisthesis   Diverticulosis    Fatty liver    History of blood transfusion    no abnormal reaction noted   History of colon cancer 2004   History of shingles    HYPERLIPIDEMIA    takes Pravastatin daily   Legally blind    Nocturia    RETINITIS PIGMENTOSA    SYNDROME, CARPAL TUNNEL    Weakness    numbness and tingling in legs and feet    Past Surgical History:  Procedure Laterality Date   ABDOMINAL HYSTERECTOMY  1989   ANTERIOR LAT LUMBAR FUSION Right 01/06/2018   Procedure: Right Lumbar Two-Three Anterolateral lumbar interbody fusion with lateral plate;  Surgeon: Erline Levine,  MD;  Location: [facility];  Service: Neurosurgery;  Laterality: Right;  Right Lumbar Two-Three Anterolateral lumbar interbody fusion with lateral plate   APPENDECTOMY  1946   carapl tunnel release Left    cataract surgery Bilateral    COLONOSCOPY     MAXIMUM ACCESS (MAS)POSTERIOR LUMBAR INTERBODY FUSION (PLIF) 2 LEVEL N/A 11/14/2015   Procedure: Lumbar Four-Five Maximum access posterior lumbar interbody fusion, Lumbar Three-Four Lumbar Four-Five Posterolateral Fusion and Pedicle Screws;  Surgeon: Erline Levine, MD;  Location: [place];  Service: Neurosurgery;  Laterality: N/A;  L3-4 L4-5 Maximum access posterior lumbar interbody fusion   OOPHORECTOMY  1989   s/p ganglion cyst  1973   s/p right hemicolectomy  12 yrs ago    Current Medications, Allergies, Family History and Social History were reviewed in [employer] record.     Current Outpatient Medications  Medication Sig Dispense Refill   Alpha-Lipoic Acid 100 MG CAPS Take 100 mg by mouth daily.      AMBULATORY NON FORMULARY MEDICATION 2 capsules in the morning and at bedtime. Medication Name: Arthroflex     AMBULATORY NON FORMULARY MEDICATION 2 capsules in the morning and at bedtime. Medication Name: ART supplement     AMBULATORY NON FORMULARY MEDICATION 2 capsules daily. Medication Name: Martyn Ehrich COMPLEX     Cholecalciferol (VITAMIN D3) 3000 units TABS Take 3,000 Units by mouth daily.      cyanocobalamin 1000 MCG tablet Take 1,000 mcg by mouth daily.     magnesium 30 MG tablet Take 30 mg by mouth daily.     No current facility-administered medications for this visit.    Review of Systems: No chest pain. No shortness of breath. No urinary complaints.    Physical Exam  Wt Readings from Last 3 Encounters:  02/20/22 179 lb 14.3 oz (81.6 kg)  04/03/21 175 lb (79.4 kg)  02/28/20 185 lb (83.9 kg)    BP 110/70   Pulse 68   Ht '5\' 1"'  (1.549 m)   Wt 170 lb (77.1 kg)   BMI 32.12 kg/m  Constitutional:  Generally  well appearing female in no acute distress. In wheelchair. Hard of hearing.  Psychiatric: Pleasant. Normal mood and affect. Behavior is normal. EENT: Pupils normal.  Conjunctivae are normal.  No scleral icterus. Neck supple.  Cardiovascular: Normal rate, regular rhythm.  Pulmonary/chest: Effort normal and breath sounds normal. No wheezing, rales or rhonchi. Abdominal: Examined in wheelchair. Abdomen soft, nondistended, nontender. Bowel sounds active throughout. There are no masses palpable. No hepatomegaly. Neurological: Alert and oriented to person place and time. Skin: Skin is warm and dry. No rashes noted.  I spent 30 minutes total reviewing records, obtaining history, performing exam, counseling patient and documenting visit / findings.    Tye Savoy, NP  02/26/2022, 7:53 AM        ]

## 2022-02-28 NOTE — Progress Notes (Signed)
Reviewed. I do not feel strong about colonoscopy, if there are no clinical concerns. Thanks, Nevin Bloodgood

## 2022-03-02 ENCOUNTER — Telehealth: Payer: Self-pay

## 2022-03-02 DIAGNOSIS — G629 Polyneuropathy, unspecified: Secondary | ICD-10-CM | POA: Diagnosis not present

## 2022-03-02 DIAGNOSIS — H548 Legal blindness, as defined in USA: Secondary | ICD-10-CM | POA: Diagnosis not present

## 2022-03-02 DIAGNOSIS — M6281 Muscle weakness (generalized): Secondary | ICD-10-CM | POA: Diagnosis not present

## 2022-03-02 DIAGNOSIS — K5900 Constipation, unspecified: Secondary | ICD-10-CM | POA: Diagnosis not present

## 2022-03-02 DIAGNOSIS — M48061 Spinal stenosis, lumbar region without neurogenic claudication: Secondary | ICD-10-CM | POA: Diagnosis not present

## 2022-03-02 NOTE — Telephone Encounter (Signed)
-----   Message from Willia Craze, NP sent at 03/02/2022 10:33 AM EDT ----- Mickel Baas,  Please call patient and see if her upper abdominal pain has improved on omeprazole and that her bowels are moving better on MiraLAX.  Please let me know.  Dr. Henrene Pastor does not feel she needs to have another colonoscopy at this point.  Thanks

## 2022-03-02 NOTE — Telephone Encounter (Signed)
Spoke with pt and pt stated that she felt like nausea and upper abdominal pain have improved. Pt also stated that she feels like her bowels are moving better on miralax. Pt said she previously had more liquid stool but now bowel movements are formed. Let pt know that Dr. Henrene Pastor does not feel like she needs to have another colonoscopy at this point and pt verbalized understanding.

## 2022-03-05 DIAGNOSIS — H8110 Benign paroxysmal vertigo, unspecified ear: Secondary | ICD-10-CM | POA: Diagnosis not present

## 2022-03-05 DIAGNOSIS — Z87440 Personal history of urinary (tract) infections: Secondary | ICD-10-CM | POA: Diagnosis not present

## 2022-03-05 DIAGNOSIS — G8929 Other chronic pain: Secondary | ICD-10-CM | POA: Diagnosis not present

## 2022-03-05 DIAGNOSIS — F419 Anxiety disorder, unspecified: Secondary | ICD-10-CM | POA: Diagnosis not present

## 2022-03-05 DIAGNOSIS — M1990 Unspecified osteoarthritis, unspecified site: Secondary | ICD-10-CM | POA: Diagnosis not present

## 2022-03-05 DIAGNOSIS — E785 Hyperlipidemia, unspecified: Secondary | ICD-10-CM | POA: Diagnosis not present

## 2022-03-05 DIAGNOSIS — M48061 Spinal stenosis, lumbar region without neurogenic claudication: Secondary | ICD-10-CM | POA: Diagnosis not present

## 2022-03-05 DIAGNOSIS — H548 Legal blindness, as defined in USA: Secondary | ICD-10-CM | POA: Diagnosis not present

## 2022-03-05 DIAGNOSIS — Z6833 Body mass index (BMI) 33.0-33.9, adult: Secondary | ICD-10-CM | POA: Diagnosis not present

## 2022-03-05 DIAGNOSIS — Z9181 History of falling: Secondary | ICD-10-CM | POA: Diagnosis not present

## 2022-03-05 DIAGNOSIS — H3552 Pigmentary retinal dystrophy: Secondary | ICD-10-CM | POA: Diagnosis not present

## 2022-03-05 DIAGNOSIS — I10 Essential (primary) hypertension: Secondary | ICD-10-CM | POA: Diagnosis not present

## 2022-03-05 DIAGNOSIS — R5383 Other fatigue: Secondary | ICD-10-CM | POA: Diagnosis not present

## 2022-03-05 DIAGNOSIS — N3281 Overactive bladder: Secondary | ICD-10-CM | POA: Diagnosis not present

## 2022-03-05 DIAGNOSIS — G629 Polyneuropathy, unspecified: Secondary | ICD-10-CM | POA: Diagnosis not present

## 2022-03-15 DIAGNOSIS — I10 Essential (primary) hypertension: Secondary | ICD-10-CM | POA: Diagnosis not present

## 2022-03-15 DIAGNOSIS — G8929 Other chronic pain: Secondary | ICD-10-CM | POA: Diagnosis not present

## 2022-03-15 DIAGNOSIS — H8110 Benign paroxysmal vertigo, unspecified ear: Secondary | ICD-10-CM | POA: Diagnosis not present

## 2022-03-15 DIAGNOSIS — E785 Hyperlipidemia, unspecified: Secondary | ICD-10-CM | POA: Diagnosis not present

## 2022-03-15 DIAGNOSIS — Z6833 Body mass index (BMI) 33.0-33.9, adult: Secondary | ICD-10-CM | POA: Diagnosis not present

## 2022-03-15 DIAGNOSIS — G629 Polyneuropathy, unspecified: Secondary | ICD-10-CM | POA: Diagnosis not present

## 2022-03-15 DIAGNOSIS — F419 Anxiety disorder, unspecified: Secondary | ICD-10-CM | POA: Diagnosis not present

## 2022-03-15 DIAGNOSIS — Z9181 History of falling: Secondary | ICD-10-CM | POA: Diagnosis not present

## 2022-03-15 DIAGNOSIS — M1990 Unspecified osteoarthritis, unspecified site: Secondary | ICD-10-CM | POA: Diagnosis not present

## 2022-03-15 DIAGNOSIS — H3552 Pigmentary retinal dystrophy: Secondary | ICD-10-CM | POA: Diagnosis not present

## 2022-03-15 DIAGNOSIS — N3281 Overactive bladder: Secondary | ICD-10-CM | POA: Diagnosis not present

## 2022-03-15 DIAGNOSIS — R5383 Other fatigue: Secondary | ICD-10-CM | POA: Diagnosis not present

## 2022-03-15 DIAGNOSIS — M48061 Spinal stenosis, lumbar region without neurogenic claudication: Secondary | ICD-10-CM | POA: Diagnosis not present

## 2022-03-15 DIAGNOSIS — Z87440 Personal history of urinary (tract) infections: Secondary | ICD-10-CM | POA: Diagnosis not present

## 2022-03-15 DIAGNOSIS — H548 Legal blindness, as defined in USA: Secondary | ICD-10-CM | POA: Diagnosis not present

## 2022-03-25 DIAGNOSIS — N3281 Overactive bladder: Secondary | ICD-10-CM | POA: Diagnosis not present

## 2022-03-25 DIAGNOSIS — M1990 Unspecified osteoarthritis, unspecified site: Secondary | ICD-10-CM | POA: Diagnosis not present

## 2022-03-25 DIAGNOSIS — I10 Essential (primary) hypertension: Secondary | ICD-10-CM | POA: Diagnosis not present

## 2022-03-25 DIAGNOSIS — G8929 Other chronic pain: Secondary | ICD-10-CM | POA: Diagnosis not present

## 2022-03-25 DIAGNOSIS — G629 Polyneuropathy, unspecified: Secondary | ICD-10-CM | POA: Diagnosis not present

## 2022-03-25 DIAGNOSIS — E785 Hyperlipidemia, unspecified: Secondary | ICD-10-CM | POA: Diagnosis not present

## 2022-03-25 DIAGNOSIS — Z87440 Personal history of urinary (tract) infections: Secondary | ICD-10-CM | POA: Diagnosis not present

## 2022-03-25 DIAGNOSIS — F419 Anxiety disorder, unspecified: Secondary | ICD-10-CM | POA: Diagnosis not present

## 2022-03-25 DIAGNOSIS — M48061 Spinal stenosis, lumbar region without neurogenic claudication: Secondary | ICD-10-CM | POA: Diagnosis not present

## 2022-03-25 DIAGNOSIS — H3552 Pigmentary retinal dystrophy: Secondary | ICD-10-CM | POA: Diagnosis not present

## 2022-03-25 DIAGNOSIS — Z9181 History of falling: Secondary | ICD-10-CM | POA: Diagnosis not present

## 2022-03-25 DIAGNOSIS — H8110 Benign paroxysmal vertigo, unspecified ear: Secondary | ICD-10-CM | POA: Diagnosis not present

## 2022-03-25 DIAGNOSIS — R5383 Other fatigue: Secondary | ICD-10-CM | POA: Diagnosis not present

## 2022-03-25 DIAGNOSIS — H548 Legal blindness, as defined in USA: Secondary | ICD-10-CM | POA: Diagnosis not present

## 2022-03-25 DIAGNOSIS — Z6833 Body mass index (BMI) 33.0-33.9, adult: Secondary | ICD-10-CM | POA: Diagnosis not present

## 2022-03-28 DIAGNOSIS — M1990 Unspecified osteoarthritis, unspecified site: Secondary | ICD-10-CM | POA: Diagnosis not present

## 2022-03-28 DIAGNOSIS — N3281 Overactive bladder: Secondary | ICD-10-CM | POA: Diagnosis not present

## 2022-03-28 DIAGNOSIS — M48061 Spinal stenosis, lumbar region without neurogenic claudication: Secondary | ICD-10-CM | POA: Diagnosis not present

## 2022-03-28 DIAGNOSIS — I10 Essential (primary) hypertension: Secondary | ICD-10-CM | POA: Diagnosis not present

## 2022-03-28 DIAGNOSIS — F419 Anxiety disorder, unspecified: Secondary | ICD-10-CM | POA: Diagnosis not present

## 2022-03-28 DIAGNOSIS — H8110 Benign paroxysmal vertigo, unspecified ear: Secondary | ICD-10-CM | POA: Diagnosis not present

## 2022-03-28 DIAGNOSIS — E785 Hyperlipidemia, unspecified: Secondary | ICD-10-CM | POA: Diagnosis not present

## 2022-03-28 DIAGNOSIS — H3552 Pigmentary retinal dystrophy: Secondary | ICD-10-CM | POA: Diagnosis not present

## 2022-03-28 DIAGNOSIS — H548 Legal blindness, as defined in USA: Secondary | ICD-10-CM | POA: Diagnosis not present

## 2022-03-28 DIAGNOSIS — G8929 Other chronic pain: Secondary | ICD-10-CM | POA: Diagnosis not present

## 2022-03-28 DIAGNOSIS — G629 Polyneuropathy, unspecified: Secondary | ICD-10-CM | POA: Diagnosis not present

## 2022-04-05 DIAGNOSIS — H548 Legal blindness, as defined in USA: Secondary | ICD-10-CM | POA: Diagnosis not present

## 2022-04-05 DIAGNOSIS — Z9181 History of falling: Secondary | ICD-10-CM | POA: Diagnosis not present

## 2022-04-05 DIAGNOSIS — H8110 Benign paroxysmal vertigo, unspecified ear: Secondary | ICD-10-CM | POA: Diagnosis not present

## 2022-04-05 DIAGNOSIS — I10 Essential (primary) hypertension: Secondary | ICD-10-CM | POA: Diagnosis not present

## 2022-04-05 DIAGNOSIS — Z6835 Body mass index (BMI) 35.0-35.9, adult: Secondary | ICD-10-CM | POA: Diagnosis not present

## 2022-04-05 DIAGNOSIS — G8929 Other chronic pain: Secondary | ICD-10-CM | POA: Diagnosis not present

## 2022-04-05 DIAGNOSIS — Z85038 Personal history of other malignant neoplasm of large intestine: Secondary | ICD-10-CM | POA: Diagnosis not present

## 2022-04-05 DIAGNOSIS — N3281 Overactive bladder: Secondary | ICD-10-CM | POA: Diagnosis not present

## 2022-04-05 DIAGNOSIS — F419 Anxiety disorder, unspecified: Secondary | ICD-10-CM | POA: Diagnosis not present

## 2022-04-05 DIAGNOSIS — M48061 Spinal stenosis, lumbar region without neurogenic claudication: Secondary | ICD-10-CM | POA: Diagnosis not present

## 2022-04-05 DIAGNOSIS — G629 Polyneuropathy, unspecified: Secondary | ICD-10-CM | POA: Diagnosis not present

## 2022-04-05 DIAGNOSIS — H9190 Unspecified hearing loss, unspecified ear: Secondary | ICD-10-CM | POA: Diagnosis not present

## 2022-04-05 DIAGNOSIS — E785 Hyperlipidemia, unspecified: Secondary | ICD-10-CM | POA: Diagnosis not present

## 2022-04-05 DIAGNOSIS — Z87440 Personal history of urinary (tract) infections: Secondary | ICD-10-CM | POA: Diagnosis not present

## 2022-04-05 DIAGNOSIS — M1990 Unspecified osteoarthritis, unspecified site: Secondary | ICD-10-CM | POA: Diagnosis not present

## 2022-04-05 DIAGNOSIS — H3552 Pigmentary retinal dystrophy: Secondary | ICD-10-CM | POA: Diagnosis not present

## 2022-04-05 DIAGNOSIS — R5383 Other fatigue: Secondary | ICD-10-CM | POA: Diagnosis not present

## 2022-04-12 DIAGNOSIS — E785 Hyperlipidemia, unspecified: Secondary | ICD-10-CM | POA: Diagnosis not present

## 2022-04-12 DIAGNOSIS — Z85038 Personal history of other malignant neoplasm of large intestine: Secondary | ICD-10-CM | POA: Diagnosis not present

## 2022-04-12 DIAGNOSIS — Z87440 Personal history of urinary (tract) infections: Secondary | ICD-10-CM | POA: Diagnosis not present

## 2022-04-12 DIAGNOSIS — H3552 Pigmentary retinal dystrophy: Secondary | ICD-10-CM | POA: Diagnosis not present

## 2022-04-12 DIAGNOSIS — H8110 Benign paroxysmal vertigo, unspecified ear: Secondary | ICD-10-CM | POA: Diagnosis not present

## 2022-04-12 DIAGNOSIS — G8929 Other chronic pain: Secondary | ICD-10-CM | POA: Diagnosis not present

## 2022-04-12 DIAGNOSIS — F419 Anxiety disorder, unspecified: Secondary | ICD-10-CM | POA: Diagnosis not present

## 2022-04-12 DIAGNOSIS — Z6835 Body mass index (BMI) 35.0-35.9, adult: Secondary | ICD-10-CM | POA: Diagnosis not present

## 2022-04-12 DIAGNOSIS — M48061 Spinal stenosis, lumbar region without neurogenic claudication: Secondary | ICD-10-CM | POA: Diagnosis not present

## 2022-04-12 DIAGNOSIS — R5383 Other fatigue: Secondary | ICD-10-CM | POA: Diagnosis not present

## 2022-04-12 DIAGNOSIS — G629 Polyneuropathy, unspecified: Secondary | ICD-10-CM | POA: Diagnosis not present

## 2022-04-12 DIAGNOSIS — H9190 Unspecified hearing loss, unspecified ear: Secondary | ICD-10-CM | POA: Diagnosis not present

## 2022-04-12 DIAGNOSIS — N3281 Overactive bladder: Secondary | ICD-10-CM | POA: Diagnosis not present

## 2022-04-12 DIAGNOSIS — I10 Essential (primary) hypertension: Secondary | ICD-10-CM | POA: Diagnosis not present

## 2022-04-12 DIAGNOSIS — H548 Legal blindness, as defined in USA: Secondary | ICD-10-CM | POA: Diagnosis not present

## 2022-04-12 DIAGNOSIS — M1990 Unspecified osteoarthritis, unspecified site: Secondary | ICD-10-CM | POA: Diagnosis not present

## 2022-04-12 DIAGNOSIS — Z9181 History of falling: Secondary | ICD-10-CM | POA: Diagnosis not present

## 2022-04-18 DIAGNOSIS — R5383 Other fatigue: Secondary | ICD-10-CM | POA: Diagnosis not present

## 2022-04-18 DIAGNOSIS — Z6835 Body mass index (BMI) 35.0-35.9, adult: Secondary | ICD-10-CM | POA: Diagnosis not present

## 2022-04-18 DIAGNOSIS — H8110 Benign paroxysmal vertigo, unspecified ear: Secondary | ICD-10-CM | POA: Diagnosis not present

## 2022-04-18 DIAGNOSIS — M48061 Spinal stenosis, lumbar region without neurogenic claudication: Secondary | ICD-10-CM | POA: Diagnosis not present

## 2022-04-18 DIAGNOSIS — M1990 Unspecified osteoarthritis, unspecified site: Secondary | ICD-10-CM | POA: Diagnosis not present

## 2022-04-18 DIAGNOSIS — I10 Essential (primary) hypertension: Secondary | ICD-10-CM | POA: Diagnosis not present

## 2022-04-18 DIAGNOSIS — N3281 Overactive bladder: Secondary | ICD-10-CM | POA: Diagnosis not present

## 2022-04-18 DIAGNOSIS — H548 Legal blindness, as defined in USA: Secondary | ICD-10-CM | POA: Diagnosis not present

## 2022-04-18 DIAGNOSIS — H3552 Pigmentary retinal dystrophy: Secondary | ICD-10-CM | POA: Diagnosis not present

## 2022-04-18 DIAGNOSIS — F419 Anxiety disorder, unspecified: Secondary | ICD-10-CM | POA: Diagnosis not present

## 2022-04-18 DIAGNOSIS — Z9181 History of falling: Secondary | ICD-10-CM | POA: Diagnosis not present

## 2022-04-18 DIAGNOSIS — G8929 Other chronic pain: Secondary | ICD-10-CM | POA: Diagnosis not present

## 2022-04-18 DIAGNOSIS — H9190 Unspecified hearing loss, unspecified ear: Secondary | ICD-10-CM | POA: Diagnosis not present

## 2022-04-18 DIAGNOSIS — Z85038 Personal history of other malignant neoplasm of large intestine: Secondary | ICD-10-CM | POA: Diagnosis not present

## 2022-04-18 DIAGNOSIS — E785 Hyperlipidemia, unspecified: Secondary | ICD-10-CM | POA: Diagnosis not present

## 2022-04-18 DIAGNOSIS — Z87440 Personal history of urinary (tract) infections: Secondary | ICD-10-CM | POA: Diagnosis not present

## 2022-04-18 DIAGNOSIS — G629 Polyneuropathy, unspecified: Secondary | ICD-10-CM | POA: Diagnosis not present

## 2022-04-23 DIAGNOSIS — M25561 Pain in right knee: Secondary | ICD-10-CM | POA: Diagnosis not present

## 2022-04-23 DIAGNOSIS — M1611 Unilateral primary osteoarthritis, right hip: Secondary | ICD-10-CM | POA: Diagnosis not present

## 2022-04-23 DIAGNOSIS — M1711 Unilateral primary osteoarthritis, right knee: Secondary | ICD-10-CM | POA: Diagnosis not present

## 2022-04-23 DIAGNOSIS — M25551 Pain in right hip: Secondary | ICD-10-CM | POA: Diagnosis not present

## 2022-05-04 ENCOUNTER — Encounter (HOSPITAL_COMMUNITY): Payer: Self-pay | Admitting: Emergency Medicine

## 2022-05-04 ENCOUNTER — Emergency Department (HOSPITAL_COMMUNITY)
Admission: EM | Admit: 2022-05-04 | Discharge: 2022-05-04 | Discharge status: Against Medical Advice | Payer: PPO | Attending: Student | Admitting: Student

## 2022-05-04 DIAGNOSIS — R42 Dizziness and giddiness: Secondary | ICD-10-CM | POA: Insufficient documentation

## 2022-05-04 DIAGNOSIS — R519 Headache, unspecified: Secondary | ICD-10-CM | POA: Diagnosis not present

## 2022-05-04 DIAGNOSIS — Z5321 Procedure and treatment not carried out due to patient leaving prior to being seen by health care provider: Secondary | ICD-10-CM | POA: Insufficient documentation

## 2022-05-04 DIAGNOSIS — R110 Nausea: Secondary | ICD-10-CM | POA: Insufficient documentation

## 2022-05-04 LAB — CBC
HCT: 41.2 % (ref 36.0–46.0)
Hemoglobin: 13 g/dL (ref 12.0–15.0)
MCH: 28.9 pg (ref 26.0–34.0)
MCHC: 31.6 g/dL (ref 30.0–36.0)
MCV: 91.6 fL (ref 80.0–100.0)
Platelets: 201 10*3/uL (ref 150–400)
RBC: 4.5 MIL/uL (ref 3.87–5.11)
RDW: 13.1 % (ref 11.5–15.5)
WBC: 6.6 10*3/uL (ref 4.0–10.5)
nRBC: 0 % (ref 0.0–0.2)

## 2022-05-04 LAB — BASIC METABOLIC PANEL
Anion gap: 7 (ref 5–15)
BUN: 21 mg/dL (ref 8–23)
CO2: 28 mmol/L (ref 22–32)
Calcium: 9.3 mg/dL (ref 8.9–10.3)
Chloride: 105 mmol/L (ref 98–111)
Creatinine, Ser: 0.75 mg/dL (ref 0.44–1.00)
GFR, Estimated: >60 mL/min (ref >60)
Glucose, Bld: 120 mg/dL — ABNORMAL HIGH (ref 70–99)
Potassium: 3.9 mmol/L (ref 3.5–5.1)
Sodium: 140 mmol/L (ref 135–145)

## 2022-05-04 NOTE — ED Triage Notes (Signed)
Pt arrives via EMS from home with dizziness 3 hours ago that feels like vertigo. Endorses nausea. Pt legally blind. Room is spinning when pt moves her head. No facial droop or weakness noted.

## 2022-05-04 NOTE — ED Notes (Signed)
Called pt x3 for vitals, no response. 

## 2022-05-04 NOTE — ED Provider Triage Note (Signed)
Emergency Medicine Provider Triage Evaluation Note  Mylin Gignac , a 86 y.o. female  was evaluated in triage.  Pt complains of vertiginous symptoms.  Patient states she has a history of dizziness but this feels more like vertigo with the room spinning around her.  She states the symptoms started approximately 2 hours ago she does endorse a "very mild" headache.  She also endorses nausea with no emesis.  Review of Systems  Positive: As above Negative: As above  Physical Exam  BP 138/60   Pulse 65   Resp 16   SpO2 98%  Gen:   Awake, no distress   Resp:  Normal effort  MSK:   Moves extremities without difficulty  Other:  No focal neuro deficit noted  Medical Decision Making  Medically screening exam initiated at 1:34 PM.  Appropriate orders placed.  Missie Gural was informed that the remainder of the evaluation will be completed by another provider, this initial triage assessment does not replace that evaluation, and the importance of remaining in the ED until their evaluation is complete.     Dorothyann Peng, PA-C 05/04/22 1335

## 2022-05-04 NOTE — ED Notes (Signed)
Last call for pt, no response.

## 2022-05-04 NOTE — ED Notes (Signed)
Called pt x2 for vitals, no response. °

## 2022-05-11 DIAGNOSIS — M6281 Muscle weakness (generalized): Secondary | ICD-10-CM | POA: Diagnosis not present

## 2022-05-11 DIAGNOSIS — E785 Hyperlipidemia, unspecified: Secondary | ICD-10-CM | POA: Diagnosis not present

## 2022-05-11 DIAGNOSIS — R42 Dizziness and giddiness: Secondary | ICD-10-CM | POA: Diagnosis not present

## 2022-05-11 DIAGNOSIS — R2689 Other abnormalities of gait and mobility: Secondary | ICD-10-CM | POA: Diagnosis not present

## 2022-05-28 ENCOUNTER — Other Ambulatory Visit: Payer: Self-pay | Admitting: Nurse Practitioner

## 2022-06-19 ENCOUNTER — Encounter (HOSPITAL_COMMUNITY): Payer: Self-pay | Admitting: Emergency Medicine

## 2022-06-19 ENCOUNTER — Emergency Department (HOSPITAL_COMMUNITY): Payer: PPO

## 2022-06-19 ENCOUNTER — Emergency Department (HOSPITAL_COMMUNITY)
Admission: EM | Admit: 2022-06-19 | Discharge: 2022-06-19 | Disposition: A | Discharge status: Home / Self Care | Payer: PPO | Attending: Emergency Medicine | Admitting: Emergency Medicine

## 2022-06-19 ENCOUNTER — Other Ambulatory Visit: Payer: Self-pay

## 2022-06-19 DIAGNOSIS — R5383 Other fatigue: Secondary | ICD-10-CM | POA: Diagnosis not present

## 2022-06-19 DIAGNOSIS — R1013 Epigastric pain: Secondary | ICD-10-CM | POA: Diagnosis not present

## 2022-06-19 DIAGNOSIS — N390 Urinary tract infection, site not specified: Secondary | ICD-10-CM | POA: Diagnosis not present

## 2022-06-19 DIAGNOSIS — R109 Unspecified abdominal pain: Secondary | ICD-10-CM | POA: Diagnosis not present

## 2022-06-19 DIAGNOSIS — R112 Nausea with vomiting, unspecified: Secondary | ICD-10-CM | POA: Diagnosis not present

## 2022-06-19 DIAGNOSIS — R110 Nausea: Secondary | ICD-10-CM | POA: Diagnosis not present

## 2022-06-19 DIAGNOSIS — I700 Atherosclerosis of aorta: Secondary | ICD-10-CM | POA: Diagnosis not present

## 2022-06-19 DIAGNOSIS — R1010 Upper abdominal pain, unspecified: Secondary | ICD-10-CM | POA: Diagnosis present

## 2022-06-19 DIAGNOSIS — R531 Weakness: Secondary | ICD-10-CM | POA: Diagnosis not present

## 2022-06-19 DIAGNOSIS — I10 Essential (primary) hypertension: Secondary | ICD-10-CM | POA: Diagnosis not present

## 2022-06-19 DIAGNOSIS — E876 Hypokalemia: Secondary | ICD-10-CM | POA: Insufficient documentation

## 2022-06-19 LAB — CBC WITH DIFFERENTIAL/PLATELET
Abs Immature Granulocytes: 0.02 10*3/uL (ref 0.00–0.07)
Basophils Absolute: 0 10*3/uL (ref 0.0–0.1)
Basophils Relative: 1 %
Eosinophils Absolute: 0.1 10*3/uL (ref 0.0–0.5)
Eosinophils Relative: 1 %
HCT: 41.6 % (ref 36.0–46.0)
Hemoglobin: 13.2 g/dL (ref 12.0–15.0)
Immature Granulocytes: 0 %
Lymphocytes Relative: 21 %
Lymphs Abs: 1.3 10*3/uL (ref 0.7–4.0)
MCH: 29.1 pg (ref 26.0–34.0)
MCHC: 31.7 g/dL (ref 30.0–36.0)
MCV: 91.6 fL (ref 80.0–100.0)
Monocytes Absolute: 0.4 10*3/uL (ref 0.1–1.0)
Monocytes Relative: 7 %
Neutro Abs: 4.1 10*3/uL (ref 1.7–7.7)
Neutrophils Relative %: 70 %
Platelets: 172 10*3/uL (ref 150–400)
RBC: 4.54 MIL/uL (ref 3.87–5.11)
RDW: 13.2 % (ref 11.5–15.5)
WBC: 5.8 10*3/uL (ref 4.0–10.5)
nRBC: 0 % (ref 0.0–0.2)

## 2022-06-19 LAB — COMPREHENSIVE METABOLIC PANEL
ALT: 31 U/L (ref 0–44)
AST: 23 U/L (ref 15–41)
Albumin: 4.1 g/dL (ref 3.5–5.0)
Alkaline Phosphatase: 43 U/L (ref 38–126)
Anion gap: 12 (ref 5–15)
BUN: 23 mg/dL (ref 8–23)
CO2: 25 mmol/L (ref 22–32)
Calcium: 9.4 mg/dL (ref 8.9–10.3)
Chloride: 101 mmol/L (ref 98–111)
Creatinine, Ser: 0.79 mg/dL (ref 0.44–1.00)
GFR, Estimated: >60 mL/min (ref >60)
Glucose, Bld: 121 mg/dL — ABNORMAL HIGH (ref 70–99)
Potassium: 3.1 mmol/L — ABNORMAL LOW (ref 3.5–5.1)
Sodium: 138 mmol/L (ref 135–145)
Total Bilirubin: 0.9 mg/dL (ref 0.3–1.2)
Total Protein: 7.2 g/dL (ref 6.5–8.1)

## 2022-06-19 LAB — LIPASE, BLOOD: Lipase: 36 U/L (ref 11–51)

## 2022-06-19 LAB — URINALYSIS, ROUTINE W REFLEX MICROSCOPIC
Bilirubin Urine: NEGATIVE
Glucose, UA: NEGATIVE mg/dL
Ketones, ur: 5 mg/dL — AB
Nitrite: NEGATIVE
Protein, ur: NEGATIVE mg/dL
Specific Gravity, Urine: 1.01 (ref 1.005–1.030)
pH: 5 (ref 5.0–8.0)

## 2022-06-19 MED ORDER — PANTOPRAZOLE SODIUM 40 MG IV SOLR
40.0000 mg | Freq: Once | INTRAVENOUS | Status: AC
  Administered 2022-06-19: 40 mg via INTRAVENOUS
  Filled 2022-06-19: qty 10

## 2022-06-19 MED ORDER — SODIUM CHLORIDE 0.9 % IV BOLUS
500.0000 mL | Freq: Once | INTRAVENOUS | Status: AC
  Administered 2022-06-19: 500 mL via INTRAVENOUS

## 2022-06-19 MED ORDER — POTASSIUM CHLORIDE CRYS ER 20 MEQ PO TBCR
40.0000 meq | EXTENDED_RELEASE_TABLET | Freq: Once | ORAL | Status: AC
  Administered 2022-06-19: 40 meq via ORAL
  Filled 2022-06-19: qty 2

## 2022-06-19 MED ORDER — CEPHALEXIN 500 MG PO CAPS: 500.0000 mg | ORAL_CAPSULE | Freq: Two times a day (BID) | ORAL | 0 refills | Status: DC

## 2022-06-19 MED ORDER — CEPHALEXIN 500 MG PO CAPS
500.0000 mg | ORAL_CAPSULE | Freq: Once | ORAL | Status: AC
  Administered 2022-06-19: 500 mg via ORAL
  Filled 2022-06-19: qty 1

## 2022-06-19 NOTE — ED Triage Notes (Signed)
Patient brought in from home by EMS with c/o weakness, nausea, lost of appetite and upper gastric pain. She lives alone and is legally blind, she was found at home by her friend who called EMS. EMS gave 6m of Zofran 165/91 86 95%b RA CBG: 141

## 2022-06-19 NOTE — ED Provider Notes (Signed)
[facility] AT [facility] Provider Note   CSN: [id number] Arrival date & time: 06/19/22  1750     History  Chief Complaint  Patient presents with   Weakness    Kathryn Romero is a 87 y.o. female.  Patient is a 87 year old female who presents with upper abdominal pain and vomiting.  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  She reports that about a week ago she had an episode where she had nausea and vomiting all night long.  No diarrhea.  She said that got better but she has been feeling a little weaker through the week and then today got nauseated again.  She has not had a good appetite for the last few days.  She was nauseated and shaky today so EMS was called.  She has been having some tenderness in her upper abdomen.  She was given Zofran by EMS and she says her pain has improved.  She has some intermittent constipation but denies any significant change in her stools.  She denies any urinary symptoms other than some decreased urine output.  No known fevers.  No cough or cold symptoms.  On chart review, she was admitted in October of last year for upper abdominal pain associate with nausea and vomiting.  Her workup was unremarkable.  She followed up with gastroenterology and had a nonrevealing workup.  She says that she previously was taking some medications prescribed by gastroenterology but has not been taking them recently.       Home Medications Prior to Admission medications   Medication Sig Start Date End Date Taking? Authorizing Provider  cephALEXin (KEFLEX) 500 MG capsule Take 1 capsule (500 mg total) by mouth 2 (two) times daily. 06/19/22  Yes Malvin Johns, MD  Alpha-Lipoic Acid 100 MG CAPS Take 100 mg by mouth daily.  Patient not taking: Reported on 02/26/2022    [provider]  AMBULATORY NON FORMULARY MEDICATION 2 capsules in the morning and at bedtime. Medication Name: Arthroflex Patient not taking:  Reported on 02/26/2022    [provider]  AMBULATORY NON FORMULARY MEDICATION 2 capsules in the morning and at bedtime. Medication Name: ART supplement Patient not taking: Reported on 02/26/2022    [provider]  AMBULATORY NON FORMULARY MEDICATION 2 capsules daily. Medication Name: [place] Patient not taking: Reported on 02/26/2022    [provider]  Cholecalciferol (VITAMIN D3) 3000 units TABS Take 3,000 Units by mouth daily.  Patient not taking: Reported on 02/26/2022    [provider]  cyanocobalamin 1000 MCG tablet Take 1,000 mcg by mouth daily. Patient not taking: Reported on 02/26/2022    [provider]  magnesium 30 MG tablet Take 30 mg by mouth daily. Patient not taking: Reported on 02/26/2022    [provider]  omeprazole (PRILOSEC) 40 MG capsule TAKE 1 CAPSULE(40 MG) BY MOUTH DAILY 05/28/22   Willia Craze, NP  polyethylene glycol powder (GLYCOLAX/MIRALAX) 17 GM/SCOOP powder Take 17 grams twice daily x 3 days, then decrease to 17 grams once daily thereafter 02/26/22   Willia Craze, NP      Allergies    Codeine, Hydromet [hydrocodone bit-homatrop mbr], Fish allergy, Morphine, and Sulfa antibiotics    Review of Systems   Review of Systems  Constitutional:  Positive for fatigue. Negative for chills, diaphoresis and fever.  HENT:  Negative for congestion, rhinorrhea and sneezing.   Eyes: Negative.   Respiratory:  Negative for  cough, chest tightness and shortness of breath.   Cardiovascular:  Negative for chest pain and leg swelling.  Gastrointestinal:  Positive for abdominal pain, nausea and vomiting. Negative for blood in stool and diarrhea.  Genitourinary:  Negative for difficulty urinating, flank pain, frequency and hematuria.  Musculoskeletal:  Negative for arthralgias and back pain.  Skin:  Negative for rash.  Neurological:  Negative for dizziness, speech difficulty, weakness, numbness and headaches.     Physical Exam Updated Vital Signs BP (!) 149/111 (BP Location: Left Arm)   Pulse 84   Temp 98.4 F (36.9 C) (Oral)   Resp 20   Ht 5' 1"$  (1.549 m)   Wt 77 kg   SpO2 96%   BMI 32.07 kg/m  Physical Exam Constitutional:      Appearance: She is well-developed.  HENT:     Head: Normocephalic and atraumatic.  Eyes:     Pupils: Pupils are equal, round, and reactive to light.  Cardiovascular:     Rate and Rhythm: Normal rate and regular rhythm.     Heart sounds: Normal heart sounds.  Pulmonary:     Effort: Pulmonary effort is normal. No respiratory distress.     Breath sounds: Normal breath sounds. No wheezing or rales.  Chest:     Chest wall: No tenderness.  Abdominal:     General: Bowel sounds are normal.     Palpations: Abdomen is soft.     Tenderness: There is abdominal tenderness (Upper abdominal tenderness). There is no guarding or rebound.  Musculoskeletal:        General: Normal range of motion.     Cervical back: Normal range of motion and neck supple.  Lymphadenopathy:     Cervical: No cervical adenopathy.  Skin:    General: Skin is warm and dry.     Findings: No rash.  Neurological:     Mental Status: She is alert and oriented to person, place, and time.     ED Results / Procedures / Treatments   Labs (all labs ordered are listed, but only abnormal results are displayed) Labs Reviewed  COMPREHENSIVE METABOLIC PANEL - Abnormal; Notable for the following components:      Result Value   Potassium 3.1 (*)    Glucose, Bld 121 (*)    All other components within normal limits  URINALYSIS, ROUTINE W REFLEX MICROSCOPIC - Abnormal; Notable for the following components:   Color, Urine STRAW (*)    Hgb urine dipstick SMALL (*)    Ketones, ur 5 (*)    Leukocytes,Ua MODERATE (*)    Bacteria, UA MANY (*)    All other components within normal limits  LIPASE, BLOOD  CBC WITH DIFFERENTIAL/PLATELET    EKG None  Radiology CT ABDOMEN PELVIS WO CONTRAST  Result  Date: 06/19/2022 CLINICAL DATA:  Abdominal pain. EXAM: CT ABDOMEN AND PELVIS WITHOUT CONTRAST TECHNIQUE: Multidetector CT imaging of the abdomen and pelvis was performed following the standard protocol without IV contrast. RADIATION DOSE REDUCTION: This exam was performed according to the departmental dose-optimization program which includes automated exposure control, adjustment of the mA and/or kV according to patient size and/or use of iterative reconstruction technique. COMPARISON:  02/20/2022. FINDINGS: Lower chest: No acute findings. Hepatobiliary: No focal liver abnormality is seen. No gallstones, gallbladder wall thickening, or biliary dilatation. Pancreas: Unremarkable. No pancreatic ductal dilatation or surrounding inflammatory changes. Spleen: Normal in size without focal abnormality. Adrenals/Urinary Tract: Adrenal glands are unremarkable. Kidneys are normal, without renal calculi, focal lesion, or  hydronephrosis. Bladder is unremarkable. Stomach/Bowel: Small hiatal hernia. Stomach otherwise unremarkable. Small bowel and colon are normal in caliber. No wall thickening. No inflammation. Previous partial right hemicolectomy. Multiple left-sided colonic diverticula. No diverticulitis. Vascular/Lymphatic: Aortic atherosclerosis. No aneurysm. No enlarged lymph nodes. Reproductive: Status post hysterectomy. No adnexal masses. Other: No abdominal wall hernia or abnormality. No abdominopelvic ascites. Musculoskeletal: No fracture or acute finding. No bone lesion. Previous posterior fusion, L3, L4 and L5. Intervertebral fusion at L2-L3. These findings are stable. Degenerative changes noted throughout the visualized spine. Advanced arthropathic changes of the right hip. IMPRESSION: 1. No acute findings within the abdomen or pelvis. No findings to account for abdominal pain. 2. Aortic atherosclerosis. Electronically Signed   By: Lajean Manes M.D.   On: 06/19/2022 19:41    Procedures Procedures    Medications  Ordered in ED Medications  potassium chloride SA (KLOR-CON M) CR tablet 40 mEq (has no administration in time range)  sodium chloride 0.9 % bolus 500 mL (500 mLs Intravenous New Bag/Given 06/19/22 1843)  pantoprazole (PROTONIX) injection 40 mg (40 mg Intravenous Given 06/19/22 1841)  cephALEXin (KEFLEX) capsule 500 mg (500 mg Oral Given 06/19/22 2036)    ED Course/ Medical Decision Making/ A&P                             Medical Decision Making Amount and/or Complexity of Data Reviewed Labs: ordered. Radiology: ordered.  Risk Prescription drug management.   Patient is a 87 year old female who presents with upper abdominal pain and some nausea.  She had vomiting about a week ago but has not had any since that time.  She has some mild tenderness in her epigastrium.  Her labs show a slightly low potassium at 3.1.  She was given a dose of potassium supplementation in the ED.  Her urine also shows some suggestions of infection and she was started on Keflex.  CT scan does not show any acute abnormalities.  She is pretty adamant at this point about going home.  She does not want to do a p.o. trial.  She does not want to stay any longer for ambulation trial.  Her friend is here with her and will take her home.  She refuses to stay any longer.  I did encourage her to have close follow-up with her primary care doctor.  She was encouraged to take her omeprazole at home.  Return precautions were given.  Final Clinical Impression(s) / ED Diagnoses Final diagnoses:  Weakness  Urinary tract infection without hematuria, site unspecified  Epigastric pain  Hypokalemia    Rx / DC Orders ED Discharge Orders          Ordered    cephALEXin (KEFLEX) 500 MG capsule  2 times daily        06/19/22 2028              Malvin Johns, MD 06/19/22 2041

## 2022-06-19 NOTE — Discharge Instructions (Signed)
Your calcium is low and you were given a dose here in the emergency room.  You should have this rechecked by your primary care doctor.  Your urine showed suggestions of infection and we will start you on antibiotics.  Make sure you take the full course of antibiotics.  I would ensure that you are taking your omeprazole.  Have close follow-up with your primary care doctor.  Return to the emergency room if you have any worsening symptoms.

## 2022-06-24 DIAGNOSIS — N390 Urinary tract infection, site not specified: Secondary | ICD-10-CM | POA: Diagnosis not present

## 2022-06-24 DIAGNOSIS — E876 Hypokalemia: Secondary | ICD-10-CM | POA: Diagnosis not present

## 2022-06-24 DIAGNOSIS — K219 Gastro-esophageal reflux disease without esophagitis: Secondary | ICD-10-CM | POA: Diagnosis not present

## 2022-06-24 DIAGNOSIS — K5909 Other constipation: Secondary | ICD-10-CM | POA: Diagnosis not present

## 2022-06-24 DIAGNOSIS — I10 Essential (primary) hypertension: Secondary | ICD-10-CM | POA: Diagnosis not present

## 2022-07-05 ENCOUNTER — Telehealth: Payer: Self-pay | Admitting: Internal Medicine

## 2022-07-05 NOTE — Telephone Encounter (Signed)
PT is experiencing severe vomitting and no appetite. She has an appointment scheduled for May but needs ways to get some relief. Please advise.

## 2022-07-05 NOTE — Telephone Encounter (Signed)
Left detailed message for pt that she should contact her PCP office or be seen at an urgent care as we do not have any soon appts.

## 2022-07-07 DIAGNOSIS — K219 Gastro-esophageal reflux disease without esophagitis: Secondary | ICD-10-CM | POA: Diagnosis not present

## 2022-07-07 DIAGNOSIS — K5909 Other constipation: Secondary | ICD-10-CM | POA: Diagnosis not present

## 2022-07-07 DIAGNOSIS — R112 Nausea with vomiting, unspecified: Secondary | ICD-10-CM | POA: Diagnosis not present

## 2022-07-07 DIAGNOSIS — I10 Essential (primary) hypertension: Secondary | ICD-10-CM | POA: Diagnosis not present

## 2022-07-07 NOTE — Telephone Encounter (Signed)
Pt calling back stating that her PCP told her to go to the ER. She reports she has been to the ER and urgent care and gotten no help. She states she vomited all night long a few weeks ago and now she vomits every other day. States she has a discomfort under her breast all the way to her belly button and then she feels the nausea. She has a pill for nausea but states this does not help. She wanted to know if Dr. Henrene Pastor could order an xray to see what is going on with her digestive system. Pt had a CT scan done of A/P on 2/10. Please advise.

## 2022-07-07 NOTE — Telephone Encounter (Signed)
Patient is calling wondering if it's possible for her to get an x-ray to see what is wrong with her stomach. Please advise

## 2022-07-07 NOTE — Telephone Encounter (Signed)
Spoke with pt and let her know there has been a cancellation for Monday at 11:40am. Pt aware of appt date and time and she will take the appt.

## 2022-07-12 ENCOUNTER — Ambulatory Visit: Payer: PPO | Admitting: Internal Medicine

## 2022-07-12 ENCOUNTER — Telehealth: Payer: Self-pay | Admitting: Internal Medicine

## 2022-07-12 ENCOUNTER — Encounter: Payer: Self-pay | Admitting: Internal Medicine

## 2022-07-12 VITALS — BP 126/86 | HR 78 | Ht 61.0 in

## 2022-07-12 DIAGNOSIS — R110 Nausea: Secondary | ICD-10-CM | POA: Diagnosis not present

## 2022-07-12 MED ORDER — ONDANSETRON HCL 4 MG PO TABS: 4.0000 mg | ORAL_TABLET | ORAL | 6 refills | Status: DC | PRN

## 2022-07-12 MED ORDER — OMEPRAZOLE 40 MG PO CPDR: DELAYED_RELEASE_CAPSULE | ORAL | 11 refills | Status: DC

## 2022-07-12 NOTE — Progress Notes (Signed)
HISTORY OF PRESENT ILLNESS:  Kathryn Romero is a pleasant 87 y.o. female with past medical history as listed below.  I have followed her over the years for history of colon cancer status post right hemicolectomy 2004.  Multiple follow-up colonoscopies since.  Most recently 2015.  The exam was a normal postoperative exam with left-sided diverticulosis.  She is sent today by her primary provider's office regarding recurrent problems with nausea and vomiting as well as vague abdominal pain.  Patient states this has been going on for over a year.  She is accompanied by a friend/caregiver.  She is taking Pepcid at night.  She says she has had no problems in 2 weeks.  She has had multiple recurrent UTIs.  She has intermittent problems with constipation.  She was last seen in this office October 2023 regarding the same.  See that dictation.  She tells me that she gets dizziness associated with her nausea.  Despite all these complaints, no weight loss.  No GI bleeding.  She wonders about colonoscopy and upper endoscopy as suggested by her primary care team.  Patient had CT scan in October 2023 and again June 19, 2022.  No acute findings.  Laboratories reviewed.  Normal renal function.  Normal CBC with hemoglobin 13.6.  Normal calcium.  Mildly elevated hepatic transaminases.  He also had a gastric emptying scan which was normal.  She has anxiety.  She is a rambling historian.  REVIEW OF SYSTEMS:  All non-GI ROS negative unless otherwise stated in the HPI except for allergies, back pain, visual change, fatigue, hearing problems, itching, muscle cramps, excessive urination  Past Medical History:  Diagnosis Date   ANEMIA-NOS    ANXIETY    Aortic atherosclerosis ([place])    Arthritis    Cancer ([place]) 2005   colon cancer   Chronic back pain    spondylolisthesis   Diverticulosis    Fatty liver    GERD (gastroesophageal reflux disease)    History of blood transfusion    no abnormal reaction noted    History of colon cancer 2004   History of shingles    HYPERLIPIDEMIA    takes Pravastatin daily   Hypertension    Legally blind    Nausea & vomiting 02/20/2022   Nocturia    RETINITIS PIGMENTOSA    SYNDROME, CARPAL TUNNEL    Weakness    numbness and tingling in legs and feet    Past Surgical History:  Procedure Laterality Date   ABDOMINAL HYSTERECTOMY  1989   ANTERIOR LAT LUMBAR FUSION Right 01/06/2018   Procedure: Right Lumbar Two-Three Anterolateral lumbar interbody fusion with lateral plate;  Surgeon: Erline Levine, MD;  Location: [facility];  Service: Neurosurgery;  Laterality: Right;  Right Lumbar Two-Three Anterolateral lumbar interbody fusion with lateral plate   APPENDECTOMY  1946   carapl tunnel release Left    cataract surgery Bilateral    COLONOSCOPY     MAXIMUM ACCESS (MAS)POSTERIOR LUMBAR INTERBODY FUSION (PLIF) 2 LEVEL N/A 11/14/2015   Procedure: Lumbar Four-Five Maximum access posterior lumbar interbody fusion, Lumbar Three-Four Lumbar Four-Five Posterolateral Fusion and Pedicle Screws;  Surgeon: Erline Levine, MD;  Location: [facility];  Service: Neurosurgery;  Laterality: N/A;  L3-4 L4-5 Maximum access posterior lumbar interbody fusion   OOPHORECTOMY  1989   s/p ganglion cyst  1973   s/p right hemicolectomy  12 yrs ago    Social History Kathryn Romero  reports that she has never smoked. She has never used smokeless tobacco.  She reports that she does not drink alcohol and does not use drugs.  family history includes ALS in her cousin; Breast cancer in her maternal aunt; Polymyalgia rheumatica in her cousin.  Allergies  Allergen Reactions   Codeine Shortness Of Breath   Hydromet [Hydrocodone Bit-Homatrop Mbr] Shortness Of Breath and Other (See Comments)    Wheezing   Fish Allergy Other (See Comments)    Burning Sensation and Headache   Morphine Other (See Comments)    "feels funny"   Sulfa Antibiotics Nausea And Vomiting            PHYSICAL  EXAMINATION: Vital signs: BP 126/86   Pulse 78   Ht '5\' 1"'$  (1.549 m)   BMI 32.07 kg/m   Constitutional: Elderly female in wheelchair, no acute distress Psychiatric: alert and oriented x3, cooperative Eyes: extraocular movements intact, anicteric, conjunctiva pink Mouth: oral pharynx moist, no lesions Neck: supple no lymphadenopathy Cardiovascular: heart regular rate and rhythm, no murmur Lungs: clear to auscultation bilaterally Abdomen: soft, nontender, nondistended, no obvious ascites, no peritoneal signs, normal bowel sounds, no organomegaly Rectal: Omitted Extremities: no no clubbing or cyanosis.  Trace lower extremity edema bilaterally Skin: no lesions on visible extremities Neuro: No focal deficits.  Cranial nerves intact  ASSESSMENT:  1.  Chronic nausea with occasional vomiting.  CT imaging and gastric emptying scan normal.  On famotidine at night.  I suspect that her problems are not a primary GI problem, but rather secondary to recurrent UTIs and other issues.  At this point I do not think endoscopy would add much.  Rather, treat empirically as outlined below 2.  Personal history of colon cancer.  He is out of surveillance   PLAN:  1.  Prescribed omeprazole 40 mg daily.  Take in a.m. medication risks reviewed. 2.  Continue with famotidine at night 3.  Prescribe Zofran 4 mg.  Can take 1 every 4-6 hours as needed for nausea.  Medication risks reviewed 4.  Resume general medical care with PCP 5.  GI follow-up as needed A total time of 35 minutes was spent preparing to see the patient, reviewing the myriad of outside data, obtaining interval history, performing medically appropriate physical examination, counseling and educating the patient and her caregiver regarding the above listed issues, ordering multiple medications, and documenting clinical information in the health record.

## 2022-07-12 NOTE — Patient Instructions (Signed)
_______________________________________________________  If your blood pressure at your visit was 140/90 or greater, please contact your primary care physician to follow up on this.  _______________________________________________________  If you are age 85 or older, your body mass index should be between 23-30. Your Body mass index is 32.07 kg/m. If this is out of the aforementioned range listed, please consider follow up with your Primary Care Provider.  If you are age 80 or younger, your body mass index should be between 19-25. Your Body mass index is 32.07 kg/m. If this is out of the aformentioned range listed, please consider follow up with your Primary Care Provider.   ________________________________________________________  The [facility] providers would like to encourage you to use [facility] to communicate with providers for non-urgent requests or questions.  Due to long hold times on the telephone, sending your provider a message by [facility] may be a faster and more efficient way to get a response.  Please allow 48 business hours for a response.  Please remember that this is for non-urgent requests.  _______________________________________________________

## 2022-07-13 ENCOUNTER — Telehealth: Payer: Self-pay | Admitting: Internal Medicine

## 2022-07-13 NOTE — Telephone Encounter (Signed)
Inbound call from patient requesting call back to discuss questions about her medications. Please advise.

## 2022-07-16 NOTE — Telephone Encounter (Signed)
Looks like this patient saw Dr. Henrene Pastor this week.

## 2022-07-16 NOTE — Telephone Encounter (Signed)
Spoke with patient and clarified that she knew to take Omeprazole in the morning and Pepcid at night.  Patient agreed.

## 2022-07-19 ENCOUNTER — Other Ambulatory Visit (HOSPITAL_COMMUNITY): Payer: Self-pay

## 2022-07-28 ENCOUNTER — Telehealth: Payer: Self-pay

## 2022-07-28 NOTE — Telephone Encounter (Signed)
PA request received via [facility] for Ondansetron HCl 4MG  tablets  PA has been submitted as a fax to [facility] via [facility] and is pending determination.  Key: Kathryn Romero

## 2022-07-31 ENCOUNTER — Other Ambulatory Visit: Payer: Self-pay

## 2022-07-31 ENCOUNTER — Emergency Department (HOSPITAL_COMMUNITY): Payer: PPO

## 2022-07-31 ENCOUNTER — Emergency Department (HOSPITAL_COMMUNITY)
Admission: EM | Admit: 2022-07-31 | Discharge: 2022-08-01 | Disposition: A | Discharge status: Home / Self Care | Payer: PPO | Attending: Emergency Medicine | Admitting: Emergency Medicine

## 2022-07-31 ENCOUNTER — Encounter (HOSPITAL_COMMUNITY): Payer: Self-pay

## 2022-07-31 DIAGNOSIS — R531 Weakness: Secondary | ICD-10-CM | POA: Diagnosis not present

## 2022-07-31 DIAGNOSIS — E876 Hypokalemia: Secondary | ICD-10-CM

## 2022-07-31 DIAGNOSIS — R112 Nausea with vomiting, unspecified: Secondary | ICD-10-CM | POA: Diagnosis not present

## 2022-07-31 DIAGNOSIS — R2689 Other abnormalities of gait and mobility: Secondary | ICD-10-CM | POA: Insufficient documentation

## 2022-07-31 DIAGNOSIS — R000 Tachycardia, unspecified: Secondary | ICD-10-CM | POA: Diagnosis not present

## 2022-07-31 DIAGNOSIS — R2681 Unsteadiness on feet: Secondary | ICD-10-CM | POA: Insufficient documentation

## 2022-07-31 DIAGNOSIS — R109 Unspecified abdominal pain: Secondary | ICD-10-CM | POA: Diagnosis not present

## 2022-07-31 DIAGNOSIS — R0902 Hypoxemia: Secondary | ICD-10-CM | POA: Diagnosis not present

## 2022-07-31 DIAGNOSIS — J9811 Atelectasis: Secondary | ICD-10-CM | POA: Diagnosis not present

## 2022-07-31 DIAGNOSIS — R0602 Shortness of breath: Secondary | ICD-10-CM | POA: Diagnosis not present

## 2022-07-31 DIAGNOSIS — I10 Essential (primary) hypertension: Secondary | ICD-10-CM | POA: Insufficient documentation

## 2022-07-31 LAB — URINALYSIS, ROUTINE W REFLEX MICROSCOPIC
Bacteria, UA: NONE SEEN
Bilirubin Urine: NEGATIVE
Glucose, UA: 50 mg/dL — AB
Ketones, ur: 20 mg/dL — AB
Nitrite: NEGATIVE
Protein, ur: 100 mg/dL — AB
Specific Gravity, Urine: 1.024 (ref 1.005–1.030)
pH: 5 (ref 5.0–8.0)

## 2022-07-31 LAB — BASIC METABOLIC PANEL
Anion gap: 10 (ref 5–15)
BUN: 22 mg/dL (ref 8–23)
CO2: 25 mmol/L (ref 22–32)
Calcium: 8.9 mg/dL (ref 8.9–10.3)
Chloride: 103 mmol/L (ref 98–111)
Creatinine, Ser: 0.73 mg/dL (ref 0.44–1.00)
GFR, Estimated: >60 mL/min (ref >60)
Glucose, Bld: 139 mg/dL — ABNORMAL HIGH (ref 70–99)
Potassium: 3.1 mmol/L — ABNORMAL LOW (ref 3.5–5.1)
Sodium: 138 mmol/L (ref 135–145)

## 2022-07-31 LAB — CBC
HCT: 39.6 % (ref 36.0–46.0)
Hemoglobin: 12.8 g/dL (ref 12.0–15.0)
MCH: 29.6 pg (ref 26.0–34.0)
MCHC: 32.3 g/dL (ref 30.0–36.0)
MCV: 91.5 fL (ref 80.0–100.0)
Platelets: 161 10*3/uL (ref 150–400)
RBC: 4.33 MIL/uL (ref 3.87–5.11)
RDW: 13.6 % (ref 11.5–15.5)
WBC: 7.3 10*3/uL (ref 4.0–10.5)
nRBC: 0 % (ref 0.0–0.2)

## 2022-07-31 MED ORDER — POTASSIUM CHLORIDE CRYS ER 20 MEQ PO TBCR: 40.0000 meq | EXTENDED_RELEASE_TABLET | Freq: Once | ORAL | 0 refills | Status: DC

## 2022-07-31 MED ORDER — ONDANSETRON HCL 4 MG PO TABS: 4.0000 mg | ORAL_TABLET | ORAL | Status: DC | PRN

## 2022-07-31 MED ORDER — VITAMIN D 25 MCG (1000 UNIT) PO TABS
3000.0000 [IU] | ORAL_TABLET | Freq: Every day | ORAL | Status: DC
  Administered 2022-08-01: 3000 [IU] via ORAL
  Filled 2022-07-31: qty 3

## 2022-07-31 MED ORDER — MAGNESIUM OXIDE -MG SUPPLEMENT 400 (240 MG) MG PO TABS
400.0000 mg | ORAL_TABLET | Freq: Every day | ORAL | Status: DC
  Administered 2022-08-01: 400 mg via ORAL
  Filled 2022-07-31: qty 1

## 2022-07-31 MED ORDER — SODIUM CHLORIDE 0.9 % IV BOLUS
1000.0000 mL | Freq: Once | INTRAVENOUS | Status: AC
  Administered 2022-07-31: 1000 mL via INTRAVENOUS

## 2022-07-31 MED ORDER — FAMOTIDINE 20 MG PO TABS
20.0000 mg | ORAL_TABLET | Freq: Two times a day (BID) | ORAL | Status: DC
  Administered 2022-08-01: 20 mg via ORAL
  Filled 2022-07-31: qty 1

## 2022-07-31 MED ORDER — MAGNESIUM 30 MG PO TABS: 30.0000 mg | ORAL_TABLET | Freq: Every day | ORAL | Status: DC

## 2022-07-31 MED ORDER — PANTOPRAZOLE SODIUM 40 MG PO TBEC
40.0000 mg | DELAYED_RELEASE_TABLET | Freq: Every day | ORAL | Status: DC
  Administered 2022-08-01: 40 mg via ORAL
  Filled 2022-07-31: qty 1

## 2022-07-31 NOTE — ED Triage Notes (Signed)
Pt reports the generalized weakness has been present for over a year. Pt reports she has had chronic abd pain N/V for "many months." Pt states today she came to the hospital because her friend noted her B/P was high. Pt is a poor historian to present illness. A/Ox4.
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
## 2022-07-31 NOTE — ED Notes (Signed)
Pt. Assisted to bedside commode with 2 assist for safety.

## 2022-07-31 NOTE — ED Triage Notes (Addendum)
Pt BIB [facility] from home C/O generalized weakness, n/v/d.   HR 93 182/95 97% RA  CBG 156  4 mg Zofran  18 LAC  EMS report accepted by [place] N, RN.

## 2022-07-31 NOTE — ED Notes (Signed)
Pt. Assisted to bedside commode for safety with 1 assist.
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
## 2022-07-31 NOTE — ED Provider Notes (Signed)
I saw and evaluated the patient, reviewed the resident's note and I agree with the findings and plan.  EKG Interpretation  Date/Time:  Saturday July 31 2022 20:11:30 EDT Ventricular Rate:  92 PR Interval:  206 QRS Duration: 91 QT Interval:  354 QTC Calculation: 438 R Axis:   52 Text Interpretation: Sinus rhythm Low voltage, precordial leads Confirmed by Lacretia Leigh ([id number]) on 07/31/2022 8:50:59 PM   87 year old female who presents with worsening chronic weakness.  Patient was found to be hypertensive.  She denies any prior history of hypertension unchanged from prior.  Also has chronic abdominal pain.  No fever or vomiting.  Patient exam is not focal at this time.  No evidence of neurological compromise.  Will check routine labs and likely discharge   Lacretia Leigh, MD 07/31/22 2055

## 2022-07-31 NOTE — Discharge Instructions (Addendum)
Your blood pressure was elevated today.  We would recommend that you follow-up with your primary care provider for blood pressure recheck and potentially starting on medications to better control this. Long-standing uncontrolled blood pressure can lead to serious problems such as stroke, heart attack, and heart failure. We do NOT suspect any of these problems at this time, however.   Your chest x-ray looked good. Your blood work did show slightly low potassium for which we gave you a supplement. You should also have blood work rechecked by your primary care physician.   Return for chest pain, one-sided weakness, slurred speech, shortness of breath, falls.    You were set up with [facility] through [facility] for PT, OT, and [place].

## 2022-08-01 MED ORDER — POTASSIUM CHLORIDE CRYS ER 20 MEQ PO TBCR
40.0000 meq | EXTENDED_RELEASE_TABLET | Freq: Once | ORAL | Status: AC
  Administered 2022-08-01: 40 meq via ORAL
  Filled 2022-08-01: qty 2

## 2022-08-01 NOTE — Evaluation (Signed)
Occupational Therapy Evaluation Patient Details Name: Kathryn Romero MRN: [id number] DOB: May 08, 1936 Today's Date: 08/01/2022   History of Present Illness Pt is an 87 y/o female presenting with progressive weakness, nausea/vomiting and HTN. PMH: retinitis pigmentosa (legally blind), chronic abdominal pain, anxiety   Clinical Impression   PTA, pt lives alone, typically Modified Independent with majority of ADLs and mobility using Rollator. Pt does report having an aide 1x/wk to assist with tub transfers and cleaning, as well as intermittent assist from friends and brother in [place] (orders groceries online for pt). Pt presents now fairly close to reported baseline. Due to baseline visual deficits in new, unfamiliar environment, pt does require Min A for ADLs and mobility using RW. Anticipate pt will do better once in familiar home environment but would recommend HHOT follow up to assess ADL routine, med mgmt and general IADL mgmt in the home. Pt may also benefit from increased [place] aide support at DC.     Recommendations for follow up therapy are one component of a multi-disciplinary discharge planning process, led by the attending physician.  Recommendations may be updated based on patient status, additional functional criteria and insurance authorization.   Follow Up Recommendations  Home health OT     Assistance Recommended at Discharge Set up Supervision/Assistance  Patient can return home with the following A little help with bathing/dressing/bathroom;Assistance with cooking/housework;Direct supervision/assist for medications management;Direct supervision/assist for financial management;Assist for transportation;Help with stairs or ramp for entrance    Functional Status Assessment  Patient has had a recent decline in their functional status and demonstrates the ability to make significant improvements in function in a reasonable and predictable amount of time.  Equipment  Recommendations  None recommended by OT    Recommendations for Other Services       Precautions / Restrictions Precautions Precautions: Fall;Other (comment) Precaution Comments: low vision Restrictions Weight Bearing Restrictions: No      Mobility Bed Mobility Overal bed mobility: Needs Assistance Bed Mobility: Supine to Sit     Supine to sit: Min assist, +2 for physical assistance, +2 for safety/equipment, HOB elevated     General bed mobility comments: cues d/t low vision, tactile cues for LEs off of L side of bed, handheld assist w/ pt lifting trunk forward    Transfers Overall transfer level: Needs assistance Equipment used: Rolling walker (2 wheels) Transfers: Sit to/from Stand Sit to Stand: Min assist, +2 physical assistance, +2 safety/equipment           General transfer comment: +2 for safety, cues for orientation of RW (as pt familiar with Rollator at home), to gain balance initially      Balance Overall balance assessment: Needs assistance Sitting-balance support: No upper extremity supported, Feet supported Sitting balance-Leahy Scale: Fair     Standing balance support: Bilateral upper extremity supported, During functional activity Standing balance-Leahy Scale: Poor                             ADL either performed or assessed with clinical judgement   ADL Overall ADL's : Needs assistance/impaired Eating/Feeding: Set up;Sitting   Grooming: Min guard;Standing   Upper Body Bathing: Supervision/ safety;Sitting   Lower Body Bathing: Minimal assistance;Sit to/from stand;Sitting/lateral leans   Upper Body Dressing : Supervision/safety;Sitting   Lower Body Dressing: Minimal assistance;Sitting/lateral leans;Sit to/from stand   Toilet Transfer: Minimal assistance;Ambulation;Rolling walker (2 wheels) (+2 was used for safety but able to progress to +1)   Toileting-  Clothing Manipulation and Hygiene: Minimal assistance;Sitting/lateral  lean;Sit to/from stand       Functional mobility during ADLs: Minimal assistance;Rolling walker (2 wheels) (+2 was used for safety but able to progress to +1) General ADL Comments: Fairly close to baseline w/ increased assist likely needed d/t low vision and in new unfamiliar environment. Discussed IADL routine w/ pt and educated on consideration of obtaining additional support if possible     Vision Baseline Vision/History: 2 Legally blind Ability to See in Adequate Light: 4 Severely impaired Patient Visual Report: No change from baseline Vision Assessment?: Vision impaired- to be further tested in functional context Additional Comments: denies ability to see shadows or light     Perception     Praxis      Pertinent Vitals/Pain Pain Assessment Pain Assessment: No/denies pain     Hand Dominance Right   Extremity/Trunk Assessment Upper Extremity Assessment Upper Extremity Assessment: Overall WFL for tasks assessed   Lower Extremity Assessment Lower Extremity Assessment: Defer to PT evaluation   Cervical / Trunk Assessment Cervical / Trunk Assessment: Normal   Communication Communication Communication: HOH   Cognition Arousal/Alertness: Awake/alert Behavior During Therapy: WFL for tasks assessed/performed Overall Cognitive Status: No family/caregiver present to determine baseline cognitive functioning Area of Impairment: Memory, Awareness                     Memory: Decreased short-term memory     Awareness: Emergent   General Comments: pt self endorses memory problems multiple times during session. pleasant, follows directions consistent though some decreased insight into where increased assist may be needed (med mgmt, etc)     General Comments       Exercises     Shoulder Instructions      Home Living Family/patient expects to be discharged to:: Private residence Living Arrangements: Alone Available Help at Discharge: Family;Available  PRN/intermittently;Friend(s) Type of Home: House (duplex) Home Access: Stairs to enter Entrance Stairs-Number of Steps: 3 Entrance Stairs-Rails: Right;Left Home Layout: One level     Bathroom Shower/Tub: Teacher, early years/pre: Standard     Home Equipment: Rollator (4 wheels)          Prior Functioning/Environment Prior Level of Function : Needs assist             Mobility Comments: patient uses rollator at home.  Always has someone with her for her steps into/out of home per prior admission ADLs Comments: pt has an aide that assists 1x/wk for baths (pt likes to tub soak) and cleaning; reports MOD I for other ADLs. brother (in [place]) orders groceries for pt online, friend assists with transportation to MD appts. Pt reports managing her own meds but does not like to take a lot of medications (reports only taking approx 2 meds including acid reflux)        OT Problem List: Decreased activity tolerance;Impaired balance (sitting and/or standing);Decreased cognition;Decreased safety awareness;Impaired vision/perception      OT Treatment/Interventions: Self-care/ADL training;Therapeutic exercise;Energy conservation;DME and/or AE instruction;Therapeutic activities;Patient/family education    OT Goals(Current goals can be found in the care plan section) Acute Rehab OT Goals Patient Stated Goal: go home, not go to a rehab OT Goal Formulation: With patient Time For Goal Achievement: 08/15/22 Potential to Achieve Goals: Good  OT Frequency: Min 2X/week    Co-evaluation PT/OT/SLP Co-Evaluation/Treatment: Yes Reason for Co-Treatment: For patient/therapist safety;To address functional/ADL transfers;Other (comment) (determine DC plan)   OT goals addressed during session: ADL's and self-care;Proper use  of Adaptive equipment and DME      AM-PAC OT "6 Clicks" Daily Activity     Outcome Measure Help from another person eating meals?: A Little Help from another person  taking care of personal grooming?: A Little Help from another person toileting, which includes using toliet, bedpan, or urinal?: A Little Help from another person bathing (including washing, rinsing, drying)?: A Little Help from another person to put on and taking off regular upper body clothing?: A Little Help from another person to put on and taking off regular lower body clothing?: A Little 6 Click Score: 18   End of Session Equipment Utilized During Treatment: Gait belt;Rolling walker (2 wheels) Nurse Communication: Mobility status  Activity Tolerance: Patient tolerated treatment well Patient left: in bed;with call bell/phone within reach  OT Visit Diagnosis: Unsteadiness on feet (R26.81);Other abnormalities of gait and mobility (R26.89)                Time: [id number] OT Time Calculation (min): 32 min Charges:  OT General Charges $OT Visit: 1 Visit OT Evaluation $OT Eval Low Complexity: 1 Low  Malachy Chamber, OTR/L Acute Rehab Services Office: [phone]   Layla Maw 08/01/2022, 12:44 PM

## 2022-08-01 NOTE — Progress Notes (Signed)
Transition of Care [facility]) - Emergency Department Mini Assessment   Patient Details  Name: Kathryn Romero MRN: [id number] Date of Birth: 03/14/1936  [facility] CM/SW Contact:    Kimber Relic, LCSW Phone Number: 08/01/2022, 8:20 AM   Clinical Narrative: Pt presented to ED for weakness. Pt receives assistance from her neighbor. TOC consulted for HH/DME; however, TOC noted PT/OT ordered to see pt. This Probation officer spoke with pt regarding [place] and informed PT/OT will also see her for an evaluation. Pt states she does not know much about HH or rehab and would prefer to discuss with PT during her evaluation. TOC will follow for PT/OT recommendation.    ED Mini Assessment: What brought you to the Emergency Department? : weakness  Barriers to Discharge: No Barriers Identified             Patient Contact and Communications Key Contact 1: Patient   Spoke with: Patient Contact Date: 08/01/22,   Contact time: 0815 Contact Phone Number: [phone] Call outcome: Pt wanting to discuss recommendations with PT.         Admission diagnosis:  Weakness Patient Active Problem List   Diagnosis Date Noted   Diarrhea 02/22/2022   Abdominal pain 02/22/2022   Nausea & vomiting 02/20/2022   Hypokalemia 02/20/2022   Legal blindness 02/20/2022   Degenerative lumbar spinal stenosis 01/06/2018   Spondylolisthesis at L4-L5 level 11/14/2015   DOE (dyspnea on exertion) 10/14/2014   Generalized weakness 09/19/2014   Obese 05/20/2014   Nausea with vomiting 04/16/2009   RETINITIS PIGMENTOSA 12/04/2008   GLUCOSE INTOLERANCE 05/09/2007   Dyslipidemia 05/09/2007   ANEMIA-NOS 05/09/2007   ANXIETY 05/09/2007   Essential hypertension 05/09/2007   DIVERTICULOSIS, COLON 05/09/2007   BACK PAIN 05/09/2007   History of malignant neoplasm of large intestine 05/09/2007   ARTHRITIS, TRAUMATIC, UNSPECIFIED SITE 12/17/2006   PCP:  Prince Solian, MD Pharmacy:   [place], [place] - [address] [place] [address] Phone: [phone] Fax: [phone]  [id number] - [place], [place] - [address] AT [place] [address] [place] [address] Phone: [phone] Fax: [phone]  [facility] [place], [place] [address] Phone: [phone] Fax: [phone]  [id number] - [place], [place] [address] [place] [address] Phone: [phone] Fax: [phone]  [id number] - [place], [place] [address] Phone: [phone] Fax: [phone]

## 2022-08-01 NOTE — ED Notes (Signed)
Pt resting comfortably at this time.

## 2022-08-01 NOTE — ED Provider Notes (Signed)
Patient had a PT evaluation and recommended home PT.  Home health services were ordered.  Patient feels comfortable going home and actually says that she cannot wait to get home where she is more comfortable in her home environment.  Her friend is coming to take her home.  She declines placement in a facility.   Kathryn Johns, MD 08/01/22 [phone]

## 2022-08-01 NOTE — ED Notes (Signed)
Pt tolerated ambulation with PT, walker used. Dr. Tamera Punt notified

## 2022-08-01 NOTE — ED Provider Notes (Signed)
Emergency Medicine Observation Re-evaluation Note  Kathryn Romero is a 87 y.o. female, seen on rounds today.  Pt initially presented to the ED for complaints of No chief complaint on file. Currently, the patient is cleared for discharge.  Physical Exam  BP 116/61   Pulse 66   Temp 98.3 F (36.8 C) (Oral)   Resp 18   Wt 72.6 kg   SpO2 92%   BMI 30.23 kg/m  Physical Exam   ED Course / MDM  EKG:EKG Interpretation  Date/Time:  Saturday July 31 2022 20:11:30 EDT Ventricular Rate:  92 PR Interval:  206 QRS Duration: 91 QT Interval:  354 QTC Calculation: 438 R Axis:   52 Text Interpretation: Sinus rhythm Low voltage, precordial leads Confirmed by Lacretia Leigh ([id number]) on 07/31/2022 8:53:26 PM  I have reviewed the labs performed to date as well as medications administered while in observation.  Recent changes in the last 24 hours include seen by PT and OT going home.  Plan  Current plan is for .    Lacretia Leigh, MD 08/01/22 [phone]

## 2022-08-01 NOTE — ED Notes (Signed)
Meal tray given 

## 2022-08-01 NOTE — ED Notes (Signed)
Assisted pt with use of phone to speak with social work

## 2022-08-01 NOTE — Evaluation (Signed)
Physical Therapy Evaluation Patient Details Name: Kathryn Romero MRN: [id number] DOB: 1935/08/08 Today's Date: 08/01/2022  History of Present Illness  Pt is an 87 y/o female presenting with progressive weakness, nausea/vomiting and HTN. PMH: retinitis pigmentosa (legally blind), chronic abdominal pain, anxiety  Clinical Impression  PTA, pt lives alone, typically Modified Independent with majority of ADLs and mobility using Rollator. Pt does report having an aide 1x/wk to assist with tub transfers and cleaning, as well as intermittent assist from friends and brother in [place] (orders groceries online for pt). Pt presents now fairly close to reported baseline. Due to baseline visual deficits in new, unfamiliar environment, pt does require Min A for ADLs and mobility using RW. Anticipate pt will do better once in familiar home environment but would recommend HHPT follow up to maximize IND and safety at home. Pt may also benefit from increased Onset aide support at DC.      Recommendations for follow up therapy are one component of a multi-disciplinary discharge planning process, led by the attending physician.  Recommendations may be updated based on patient status, additional functional criteria and insurance authorization.  Follow Up Recommendations Home health PT      Assistance Recommended at Discharge Intermittent Supervision/Assistance  Patient can return home with the following  A little help with walking and/or transfers;A little help with bathing/dressing/bathroom;Assistance with cooking/housework;Assist for transportation;Help with stairs or ramp for entrance    Equipment Recommendations None recommended by PT  Recommendations for Other Services       Functional Status Assessment Patient has had a recent decline in their functional status and demonstrates the ability to make significant improvements in function in a reasonable and predictable amount of time.     Precautions /  Restrictions Precautions Precautions: Fall;Other (comment) Precaution Comments: low vision Restrictions Weight Bearing Restrictions: No      Mobility  Bed Mobility Overal bed mobility: Needs Assistance Bed Mobility: Supine to Sit     Supine to sit: Min assist, +2 for physical assistance, +2 for safety/equipment, HOB elevated     General bed mobility comments: cues d/t low vision, tactile cues for LEs off of L side of bed, handheld assist w/ pt lifting trunk forward; Assist also 2* difficulty transitioning from gurney rather than bed    Transfers Overall transfer level: Needs assistance Equipment used: Rolling walker (2 wheels) Transfers: Sit to/from Stand Sit to Stand: Min guard           General transfer comment: +2 for safety, cues for orientation of RW (as pt familiar with Rollator at home), to gain balance initially.  Increased difficulty transitioning to/from guerny 2* height of same and pt states "I'm short you know"    Ambulation/Gait Ambulation/Gait assistance: Min assist, Min guard Gait Distance (Feet): 130 Feet Assistive device: Rolling walker (2 wheels) Gait Pattern/deviations: Step-through pattern, Decreased step length - right, Decreased step length - left, Shuffle Gait velocity: decr     General Gait Details: Steady assist with assist to manouver RW safely 2* legal blindness and unfamiliar environment  Stairs            Wheelchair Mobility    Modified Rankin (Stroke Patients Only)       Balance Overall balance assessment: Needs assistance Sitting-balance support: No upper extremity supported, Feet unsupported Sitting balance-Leahy Scale: Fair     Standing balance support: Bilateral upper extremity supported, During functional activity Standing balance-Leahy Scale: Poor  Pertinent Vitals/Pain Pain Assessment Pain Assessment: No/denies pain    Home Living Family/patient expects to be  discharged to:: Private residence Living Arrangements: Alone Available Help at Discharge: Family;Available PRN/intermittently;Friend(s) Type of Home: House Home Access: Stairs to enter Entrance Stairs-Rails: Psychiatric nurse of Steps: 3   Home Layout: One level Home Equipment: Rollator (4 wheels)      Prior Function Prior Level of Function : Needs assist             Mobility Comments: patient uses rollator at home.  Always has someone with her for her steps into/out of home per prior admission ADLs Comments: pt has an aide that assists 1x/wk for baths (pt likes to tub soak) and cleaning; reports MOD I for other ADLs. brother (in [place]) orders groceries for pt online, friend assists with transportation to MD appts. Pt reports managing her own meds but does not like to take a lot of medications (reports only taking approx 2 meds including acid reflux)     Hand Dominance   Dominant Hand: Right    Extremity/Trunk Assessment   Upper Extremity Assessment Upper Extremity Assessment: Overall WFL for tasks assessed    Lower Extremity Assessment Lower Extremity Assessment: Overall WFL for tasks assessed;RLE deficits/detail;LLE deficits/detail RLE Sensation: history of peripheral neuropathy LLE Sensation: history of peripheral neuropathy    Cervical / Trunk Assessment Cervical / Trunk Assessment: Normal  Communication   Communication: HOH  Cognition Arousal/Alertness: Awake/alert Behavior During Therapy: WFL for tasks assessed/performed Overall Cognitive Status: No family/caregiver present to determine baseline cognitive functioning Area of Impairment: Memory, Awareness                     Memory: Decreased short-term memory     Awareness: Emergent   General Comments: pt self endorses memory problems multiple times during session. pleasant, follows directions consistent though some decreased insight into where increased assist may be needed (med  mgmt, etc)        General Comments      Exercises     Assessment/Plan    PT Assessment Patient needs continued PT services  PT Problem List Decreased balance;Decreased mobility;Decreased knowledge of use of DME;Decreased cognition       PT Treatment Interventions DME instruction;Gait training;Stair training;Functional mobility training;Therapeutic activities;Therapeutic exercise;Patient/family education;Balance training    PT Goals (Current goals can be found in the Care Plan section)  Acute Rehab PT Goals Patient Stated Goal: HOME PT Goal Formulation: With patient Time For Goal Achievement: 08/15/22 Potential to Achieve Goals: Good    Frequency Min 3X/week     Co-evaluation PT/OT/SLP Co-Evaluation/Treatment: Yes Reason for Co-Treatment: For patient/therapist safety;To address functional/ADL transfers;Other (comment) PT goals addressed during session: Mobility/safety with mobility OT goals addressed during session: ADL's and self-care;Proper use of Adaptive equipment and DME       AM-PAC PT "6 Clicks" Mobility  Outcome Measure Help needed turning from your back to your side while in a flat bed without using bedrails?: None Help needed moving from lying on your back to sitting on the side of a flat bed without using bedrails?: A Little Help needed moving to and from a bed to a chair (including a wheelchair)?: A Little Help needed standing up from a chair using your arms (e.g., wheelchair or bedside chair)?: A Little Help needed to walk in hospital room?: A Little Help needed climbing 3-5 steps with a railing? : A Little 6 Click Score: 19    End of Session Equipment  Utilized During Treatment: Gait belt Activity Tolerance: Patient tolerated treatment well Patient left: in bed;with call bell/phone within reach Nurse Communication: Mobility status PT Visit Diagnosis: Unsteadiness on feet (R26.81);Other (comment)    Time: 1132-1205 PT Time Calculation (min) (ACUTE  ONLY): 33 min   Charges:   PT Evaluation $PT Eval Low Complexity: 1 Low          [place] Pager [phone] Office [phone]   Dale Ribeiro 08/01/2022, 1:18 PM

## 2022-08-01 NOTE — ED Notes (Signed)
Pt eating breakfast at this time.  

## 2022-08-01 NOTE — ED Notes (Signed)
Pt asleep at this time

## 2022-08-01 NOTE — Progress Notes (Signed)
TOC following for PT/OT recommendations.

## 2022-08-01 NOTE — ED Notes (Signed)
PT at bedside.

## 2022-08-01 NOTE — ED Notes (Signed)
Pt placed on bedpan

## 2022-08-01 NOTE — Progress Notes (Addendum)
RN notified this Probation officer that pt informed PT she is not interested in rehab. This Probation officer has set pt up with [facility] PT/OT/Aide with [employer]. Spoke with Georgina Snell who states they can accommodate. Orders placed. TOC will attempt to contact Neighbor to inquire about her picking the pt up.   Addend @ 1:06 PM Spoke with pt's neighbor, Silva Bandy, to inform that pt was set up with [facility]. Silva Bandy wrote down information. This Probation officer informed it will also be on the pt's AVS. Silva Bandy inquired about ALF and this Probation officer informed that the hospital does not place into LTC. Silva Bandy stated she understood and also shared that the pt is not interested in being placed into any facility or home. Silva Bandy states she will pick up the pt in an hour. EDP and RN notified via secure chat. No further TOC needs at this time. TOC signing off.

## 2022-08-03 DIAGNOSIS — E876 Hypokalemia: Secondary | ICD-10-CM | POA: Diagnosis not present

## 2022-08-03 DIAGNOSIS — M6281 Muscle weakness (generalized): Secondary | ICD-10-CM | POA: Diagnosis not present

## 2022-08-03 DIAGNOSIS — F419 Anxiety disorder, unspecified: Secondary | ICD-10-CM | POA: Diagnosis not present

## 2022-08-03 DIAGNOSIS — K219 Gastro-esophageal reflux disease without esophagitis: Secondary | ICD-10-CM | POA: Diagnosis not present

## 2022-08-03 DIAGNOSIS — R5381 Other malaise: Secondary | ICD-10-CM | POA: Diagnosis not present

## 2022-08-03 DIAGNOSIS — H548 Legal blindness, as defined in USA: Secondary | ICD-10-CM | POA: Diagnosis not present

## 2022-08-03 DIAGNOSIS — K5909 Other constipation: Secondary | ICD-10-CM | POA: Diagnosis not present

## 2022-08-03 DIAGNOSIS — Z66 Do not resuscitate: Secondary | ICD-10-CM | POA: Diagnosis not present

## 2022-08-03 DIAGNOSIS — H3552 Pigmentary retinal dystrophy: Secondary | ICD-10-CM | POA: Diagnosis not present

## 2022-08-03 DIAGNOSIS — R112 Nausea with vomiting, unspecified: Secondary | ICD-10-CM | POA: Diagnosis not present

## 2022-08-03 DIAGNOSIS — I10 Essential (primary) hypertension: Secondary | ICD-10-CM | POA: Diagnosis not present

## 2022-08-03 NOTE — Progress Notes (Signed)
TOC received a call from [place], pt's neighbor, wanting to know when pt's home health services will begin. This Probation officer informed that Kathryn Romero should have made contact with the pt to get her on the schedule. Kathryn Romero later stated they contacted the pt and will see her Saturday. Kathryn Romero reported that the pt is still weak and informed they've scheduled an appt with the pt's PCP to see her this afternoon. Kathryn Romero informed she will follow for guidance from PCP regarding next steps.

## 2022-08-04 ENCOUNTER — Emergency Department (HOSPITAL_COMMUNITY)
Admission: EM | Admit: 2022-08-04 | Discharge: 2022-08-05 | Disposition: A | Discharge status: Home / Self Care | Payer: PPO | Attending: Emergency Medicine | Admitting: Emergency Medicine

## 2022-08-04 ENCOUNTER — Emergency Department (HOSPITAL_COMMUNITY): Payer: PPO

## 2022-08-04 ENCOUNTER — Encounter (HOSPITAL_COMMUNITY): Payer: Self-pay

## 2022-08-04 DIAGNOSIS — I700 Atherosclerosis of aorta: Secondary | ICD-10-CM | POA: Diagnosis not present

## 2022-08-04 DIAGNOSIS — R1013 Epigastric pain: Secondary | ICD-10-CM | POA: Diagnosis not present

## 2022-08-04 DIAGNOSIS — R739 Hyperglycemia, unspecified: Secondary | ICD-10-CM | POA: Diagnosis not present

## 2022-08-04 DIAGNOSIS — R112 Nausea with vomiting, unspecified: Secondary | ICD-10-CM | POA: Insufficient documentation

## 2022-08-04 DIAGNOSIS — Z85038 Personal history of other malignant neoplasm of large intestine: Secondary | ICD-10-CM | POA: Insufficient documentation

## 2022-08-04 DIAGNOSIS — R1011 Right upper quadrant pain: Secondary | ICD-10-CM | POA: Diagnosis not present

## 2022-08-04 DIAGNOSIS — I10 Essential (primary) hypertension: Secondary | ICD-10-CM | POA: Insufficient documentation

## 2022-08-04 DIAGNOSIS — R7401 Elevation of levels of liver transaminase levels: Secondary | ICD-10-CM | POA: Insufficient documentation

## 2022-08-04 DIAGNOSIS — R457 State of emotional shock and stress, unspecified: Secondary | ICD-10-CM | POA: Diagnosis not present

## 2022-08-04 DIAGNOSIS — R1111 Vomiting without nausea: Secondary | ICD-10-CM | POA: Diagnosis not present

## 2022-08-04 LAB — URINALYSIS, ROUTINE W REFLEX MICROSCOPIC
Bacteria, UA: NONE SEEN
Bilirubin Urine: NEGATIVE
Glucose, UA: 50 mg/dL — AB
Ketones, ur: 20 mg/dL — AB
Nitrite: NEGATIVE
Protein, ur: NEGATIVE mg/dL
Specific Gravity, Urine: 1.026 (ref 1.005–1.030)
pH: 6 (ref 5.0–8.0)

## 2022-08-04 LAB — CBC WITH DIFFERENTIAL/PLATELET
Abs Immature Granulocytes: 0.03 10*3/uL (ref 0.00–0.07)
Basophils Absolute: 0 10*3/uL (ref 0.0–0.1)
Basophils Relative: 0 %
Eosinophils Absolute: 0 10*3/uL (ref 0.0–0.5)
Eosinophils Relative: 0 %
HCT: 40.7 % (ref 36.0–46.0)
Hemoglobin: 13.2 g/dL (ref 12.0–15.0)
Immature Granulocytes: 1 %
Lymphocytes Relative: 8 %
Lymphs Abs: 0.5 10*3/uL — ABNORMAL LOW (ref 0.7–4.0)
MCH: 29.5 pg (ref 26.0–34.0)
MCHC: 32.4 g/dL (ref 30.0–36.0)
MCV: 90.8 fL (ref 80.0–100.0)
Monocytes Absolute: 0.2 10*3/uL (ref 0.1–1.0)
Monocytes Relative: 4 %
Neutro Abs: 5.2 10*3/uL (ref 1.7–7.7)
Neutrophils Relative %: 87 %
Platelets: 152 10*3/uL (ref 150–400)
RBC: 4.48 MIL/uL (ref 3.87–5.11)
RDW: 13.6 % (ref 11.5–15.5)
WBC: 5.9 10*3/uL (ref 4.0–10.5)
nRBC: 0 % (ref 0.0–0.2)

## 2022-08-04 LAB — COMPREHENSIVE METABOLIC PANEL
ALT: 79 U/L — ABNORMAL HIGH (ref 0–44)
AST: 54 U/L — ABNORMAL HIGH (ref 15–41)
Albumin: 4.3 g/dL (ref 3.5–5.0)
Alkaline Phosphatase: 48 U/L (ref 38–126)
Anion gap: 11 (ref 5–15)
BUN: 31 mg/dL — ABNORMAL HIGH (ref 8–23)
CO2: 25 mmol/L (ref 22–32)
Calcium: 9.5 mg/dL (ref 8.9–10.3)
Chloride: 102 mmol/L (ref 98–111)
Creatinine, Ser: 0.83 mg/dL (ref 0.44–1.00)
GFR, Estimated: >60 mL/min (ref >60)
Glucose, Bld: 166 mg/dL — ABNORMAL HIGH (ref 70–99)
Potassium: 3.6 mmol/L (ref 3.5–5.1)
Sodium: 138 mmol/L (ref 135–145)
Total Bilirubin: 1.1 mg/dL (ref 0.3–1.2)
Total Protein: 7.1 g/dL (ref 6.5–8.1)

## 2022-08-04 LAB — LIPASE, BLOOD: Lipase: 48 U/L (ref 11–51)

## 2022-08-04 MED ORDER — SODIUM CHLORIDE 0.9 % IV BOLUS
1000.0000 mL | Freq: Once | INTRAVENOUS | Status: AC
  Administered 2022-08-04: 1000 mL via INTRAVENOUS

## 2022-08-04 MED ORDER — IOHEXOL 350 MG/ML SOLN
100.0000 mL | Freq: Once | INTRAVENOUS | Status: AC | PRN
  Administered 2022-08-04: 100 mL via INTRAVENOUS

## 2022-08-04 MED ORDER — ONDANSETRON HCL 4 MG/2ML IJ SOLN
4.0000 mg | Freq: Once | INTRAMUSCULAR | Status: AC
  Administered 2022-08-04: 4 mg via INTRAVENOUS
  Filled 2022-08-04: qty 2

## 2022-08-04 NOTE — ED Triage Notes (Signed)
Pt from home BIB [facility] for n/v/d for the past 3 days, originally pt was constipated. Pt has hx of chronic abd pain and N/V for months. Pt was here a few days ago for same. Pt HTN, hx of same, but pt reports no medication for HTN. Pt A&O x4, NAD noted.   Pt legally blind

## 2022-08-04 NOTE — ED Provider Notes (Signed)
[facility] Provider Note   CSN: [id number] Arrival date & time: 08/04/22  1922     History  Chief Complaint  Patient presents with   NV/D   N/V/D    Solay Stadtmiller is a 87 y.o. female with a past medical history significant for anxiety, hypertension, blindness, and hyperlipidemia who presents to the ED due to nausea and vomiting for the past few days.  History of chronic nausea. She admits to numerous episodes of emesis. Unsure characteristics of emesis due to blindness. Admits to upper abdominal pain.  History of chronic abdominal pain.  Follows GI.  History of colon cancer status post right hemicolectomy in 2004.  Patient has had a normal gastric emptying scan.  At patient's last GI appointment earlier this month it was thought that her chronic nausea and occasional vomiting are likely secondary to recurrent UTIs and other issues.  Patient was placed on omeprazole 40 mg daily and famotidine at night.  Patient prescribe Zofran as needed for her nausea. Seen in the ED on 3/23 for abdominal pain and generalized weakness.  Last CT abdomen pelvis in February 2024 was unremarkable.  Patient currently lives alone. Denies chest pain and shortness of breath. Last BM was earlier today, but only a small amount.   History obtained from patient and past medical records. No interpreter used during encounter.       Home Medications Prior to Admission medications   Medication Sig Start Date End Date Taking? Authorizing Provider  Alpha-Lipoic Acid 100 MG CAPS Take 100 mg by mouth daily.  Patient not taking: Reported on 02/26/2022    [provider]  AMBULATORY NON FORMULARY MEDICATION 2 capsules in the morning and at bedtime. Medication Name: Arthroflex Patient not taking: Reported on 02/26/2022    [provider]  AMBULATORY NON FORMULARY MEDICATION 2 capsules in the morning and at bedtime. Medication Name: ART supplement Patient  not taking: Reported on 02/26/2022    [provider]  AMBULATORY NON FORMULARY MEDICATION 2 capsules daily. Medication Name: [place] Patient not taking: Reported on 02/26/2022    [provider]  cephALEXin (KEFLEX) 500 MG capsule Take 1 capsule (500 mg total) by mouth 2 (two) times daily. Patient not taking: Reported on 07/12/2022 06/19/22   Malvin Johns, MD  Cholecalciferol (VITAMIN D3) 3000 units TABS Take 3,000 Units by mouth daily.  Patient not taking: Reported on 02/26/2022    [provider]  cyanocobalamin 1000 MCG tablet Take 1,000 mcg by mouth daily. Patient not taking: Reported on 02/26/2022    [provider]  famotidine (PEPCID) 20 MG tablet 1 tablet at bedtime Orally Once a day at bedtime for 90 days    [provider]  famotidine (PEPCID) 20 MG tablet Take 20 mg by mouth 2 (two) times daily.    [provider]  magnesium 30 MG tablet Take 30 mg by mouth daily. Patient not taking: Reported on 02/26/2022    [provider]  omeprazole (PRILOSEC) 40 MG capsule TAKE 1 CAPSULE(40 MG) BY MOUTH DAILY 07/12/22   Irene Shipper, MD  ondansetron (ZOFRAN) 4 MG tablet Take 1 tablet (4 mg total) by mouth every 4 (four) hours as needed for nausea or vomiting. 07/12/22   Irene Shipper, MD  polyethylene glycol powder [facility]) 17 GM/SCOOP powder Take 17 grams twice daily x 3 days, then decrease to 17 grams once daily thereafter Patient not taking: Reported on 07/12/2022 02/26/22  Willia Craze, NP  potassium chloride SA (KLOR-CON M) 20 MEQ tablet Take 2 tablets (40 mEq total) by mouth once for 1 dose. 07/31/22 07/31/22  Sharion Settler, DO      Allergies    Codeine, Hydromet [hydrocodone bit-homatrop mbr], Fish allergy, Cephalexin, Morphine, and Sulfa antibiotics    Review of Systems   Review of Systems  Constitutional:  Negative for chills and fever.  Respiratory:  Negative for shortness of breath.    Cardiovascular:  Negative for chest pain.  Gastrointestinal:  Positive for abdominal pain, nausea and vomiting. Negative for diarrhea.    Physical Exam Updated Vital Signs BP (!) 146/72   Pulse 80   Temp 98.7 F (37.1 C) (Oral)   Resp 10   Ht 5\' 1"  (1.549 m)   Wt 72.6 kg   SpO2 95%   BMI 30.23 kg/m  Physical Exam Vitals and nursing note reviewed.  Constitutional:      General: She is not in acute distress.    Appearance: She is not ill-appearing.  HENT:     Head: Normocephalic.  Eyes:     Pupils: Pupils are equal, round, and reactive to light.  Cardiovascular:     Rate and Rhythm: Normal rate and regular rhythm.     Pulses: Normal pulses.     Heart sounds: Normal heart sounds. No murmur heard.    No friction rub. No gallop.  Pulmonary:     Effort: Pulmonary effort is normal.     Breath sounds: Normal breath sounds.  Abdominal:     General: Abdomen is flat. There is no distension.     Palpations: Abdomen is soft.     Tenderness: There is abdominal tenderness. There is no guarding or rebound.     Comments: Upper abdominal tenderness without rebound or guarding  Musculoskeletal:        General: Normal range of motion.     Cervical back: Neck supple.  Skin:    General: Skin is warm and dry.  Neurological:     General: No focal deficit present.     Mental Status: She is alert.  Psychiatric:        Mood and Affect: Mood normal.        Behavior: Behavior normal.     ED Results / Procedures / Treatments   Labs (all labs ordered are listed, but only abnormal results are displayed) Labs Reviewed  CBC WITH DIFFERENTIAL/PLATELET - Abnormal; Notable for the following components:      Result Value   Lymphs Abs 0.5 (*)    All other components within normal limits  COMPREHENSIVE METABOLIC PANEL - Abnormal; Notable for the following components:   Glucose, Bld 166 (*)    BUN 31 (*)    AST 54 (*)    ALT 79 (*)    All other components within normal limits  LIPASE,  BLOOD  URINALYSIS, ROUTINE W REFLEX MICROSCOPIC  TROPONIN I (HIGH SENSITIVITY)    EKG EKG Interpretation  Date/Time:  Wednesday August 04 2022 20:49:52 EDT Ventricular Rate:  74 PR Interval:  205 QRS Duration: 108 QT Interval:  402 QTC Calculation: 446 R Axis:   9 Text Interpretation: Sinus rhythm Probable anteroseptal infarct, old No significant change since last tracing Confirmed by Gareth Morgan [phone]) on 08/04/2022 9:26:36 PM  Radiology No results found.  Procedures Procedures    Medications Ordered in ED Medications  sodium chloride 0.9 % bolus 1,000 mL (1,000 mLs Intravenous New Bag/Given 08/04/22 2041)  ondansetron [facility]) injection 4 mg (4 mg Intravenous Given 08/04/22 2041)  iohexol (OMNIPAQUE) 350 MG/ML injection 100 mL (100 mLs Intravenous Contrast Given 08/04/22 2154)    ED Course/ Medical Decision Making/ A&P                             Medical Decision Making Amount and/or Complexity of Data Reviewed External Data Reviewed: notes.    Details: Gi notes Labs: ordered. Decision-making details documented in ED Course. Radiology: ordered and independent interpretation performed. Decision-making details documented in ED Course. ECG/medicine tests: ordered and independent interpretation performed. Decision-making details documented in ED Course.  Risk Prescription drug management.   This patient presents to the ED for concern of abdominal pain, this involves an extensive number of treatment options, and is a complaint that carries with it a high risk of complications and morbidity.  The differential diagnosis includes pancreatitis, acute cholecystitis, cyclic vomiting, chronic abdominal pain, cardiac etiology, etc  87 year old female with history of chronic nausea and chronic abdominal pain presents to the ED due to nausea, vomiting for the past few days.  Patient notes she has been "coughing up mucus".  Patient also admits to epigastric pain.  Seen a few days  ago for abdominal pain and generalized weakness.  Follows GI with most recent visit earlier this month.  Patient on GERD medications.  Patient is legally blind.  Upon arrival, patient afebrile, not tachycardic or hypoxic.  Patient no acute distress.  Abdomen soft, nondistended with epigastric tenderness.  Routine labs ordered to rule out evidence of pancreatitis, electrolyte abnormalities, no evidence of infection.  IV fluids and Zofran given.  EKG to rule out atypical ACS.   CBC reassuring.  No leukocytosis.  Normal hemoglobin.  CMP significant for hyperglycemia 166.  No anion gap.  Transaminitis with AST at 54 and ALT at 79.  Normal total bilirubin.  Lipase normal.  Doubt pancreatitis.  After discussion with Dr. Billy Fischer, CTA abdomen/pelvis and RUQ [place] ordered due to epigastric pain and transaminitis. It appears no previous CTA abdomen has been performed, so will check for other etiologies of abdominal pain.   9:32 PM reassessed patient at bedside.  Patient admits to resolution of upper abdominal pain.  Abdomen soft, nondistended, nontender. Added troponin to rule out cardiac etiology. Emesis bag empty at bedside.   Patient handed off to Quincy Carnes, PA-C at shift change pending CTA abdomen, [place], troponin and UA. If unremarkable and patient able to tolerate po, she may be discharged home with PCP follow-up       Final Clinical Impression(s) / ED Diagnoses Final diagnoses:  Epigastric pain  Nausea and vomiting, unspecified vomiting type    Rx / DC Orders ED Discharge Orders     None         Karie Kirks 08/04/22 2207    Gareth Morgan, MD 08/05/22 1252

## 2022-08-04 NOTE — ED Notes (Signed)
Patient transported to CT 

## 2022-08-05 DIAGNOSIS — M519 Unspecified thoracic, thoracolumbar and lumbosacral intervertebral disc disorder: Secondary | ICD-10-CM | POA: Diagnosis not present

## 2022-08-05 DIAGNOSIS — H3552 Pigmentary retinal dystrophy: Secondary | ICD-10-CM | POA: Diagnosis not present

## 2022-08-05 DIAGNOSIS — F419 Anxiety disorder, unspecified: Secondary | ICD-10-CM | POA: Diagnosis not present

## 2022-08-05 DIAGNOSIS — E876 Hypokalemia: Secondary | ICD-10-CM | POA: Diagnosis not present

## 2022-08-05 DIAGNOSIS — H548 Legal blindness, as defined in USA: Secondary | ICD-10-CM | POA: Diagnosis not present

## 2022-08-05 DIAGNOSIS — Z6830 Body mass index (BMI) 30.0-30.9, adult: Secondary | ICD-10-CM | POA: Diagnosis not present

## 2022-08-05 DIAGNOSIS — I10 Essential (primary) hypertension: Secondary | ICD-10-CM | POA: Diagnosis not present

## 2022-08-05 DIAGNOSIS — G8929 Other chronic pain: Secondary | ICD-10-CM | POA: Diagnosis not present

## 2022-08-05 DIAGNOSIS — Z85038 Personal history of other malignant neoplasm of large intestine: Secondary | ICD-10-CM | POA: Diagnosis not present

## 2022-08-05 DIAGNOSIS — E669 Obesity, unspecified: Secondary | ICD-10-CM | POA: Diagnosis not present

## 2022-08-05 DIAGNOSIS — J9811 Atelectasis: Secondary | ICD-10-CM | POA: Diagnosis not present

## 2022-08-05 DIAGNOSIS — Z9181 History of falling: Secondary | ICD-10-CM | POA: Diagnosis not present

## 2022-08-05 DIAGNOSIS — M4310 Spondylolisthesis, site unspecified: Secondary | ICD-10-CM | POA: Diagnosis not present

## 2022-08-05 LAB — TROPONIN I (HIGH SENSITIVITY): Troponin I (High Sensitivity): 5 ng/L (ref <18)

## 2022-08-05 MED ORDER — ONDANSETRON 4 MG PO TBDP: 4.0000 mg | ORAL_TABLET | Freq: Three times a day (TID) | ORAL | 0 refills | Status: DC | PRN

## 2022-08-05 NOTE — Discharge Instructions (Signed)
Your work-up today was reassuring-- no acute findings on CT or ultrasound. Your liver enzymes were very mildly elevated, recommend to avoid tylenol and alcohol and have these re-checked with your doctor in a few weeks. Follow-up with your primary care doctor. Return here for new concerns.

## 2022-08-05 NOTE — ED Provider Notes (Signed)
Assumed care at shift change.  See prior notes for full H&P.  Briefly, 87 y.o. F here with nausea/vomiting.  History of chronic abdominal pain, follows with GI.  Has been given IVF and zofran and is now feeling better.  Plan:  labs, [place] and CTA abdomen pending.  If no acute findings, anticipate discharge.  Results for orders placed or performed during the hospital encounter of 08/04/22  CBC with Differential  Result Value Ref Range   WBC 5.9 4.0 - 10.5 K/uL   RBC 4.48 3.87 - 5.11 MIL/uL   Hemoglobin 13.2 12.0 - 15.0 g/dL   HCT 40.7 36.0 - 46.0 %   MCV 90.8 80.0 - 100.0 fL   MCH 29.5 26.0 - 34.0 pg   MCHC 32.4 30.0 - 36.0 g/dL   RDW 13.6 11.5 - 15.5 %   Platelets 152 150 - 400 K/uL   nRBC 0.0 0.0 - 0.2 %   Neutrophils Relative % 87 %   Neutro Abs 5.2 1.7 - 7.7 K/uL   Lymphocytes Relative 8 %   Lymphs Abs 0.5 (L) 0.7 - 4.0 K/uL   Monocytes Relative 4 %   Monocytes Absolute 0.2 0.1 - 1.0 K/uL   Eosinophils Relative 0 %   Eosinophils Absolute 0.0 0.0 - 0.5 K/uL   Basophils Relative 0 %   Basophils Absolute 0.0 0.0 - 0.1 K/uL   Immature Granulocytes 1 %   Abs Immature Granulocytes 0.03 0.00 - 0.07 K/uL  Comprehensive metabolic panel  Result Value Ref Range   Sodium 138 135 - 145 mmol/L   Potassium 3.6 3.5 - 5.1 mmol/L   Chloride 102 98 - 111 mmol/L   CO2 25 22 - 32 mmol/L   Glucose, Bld 166 (H) 70 - 99 mg/dL   BUN 31 (H) 8 - 23 mg/dL   Creatinine, Ser 0.83 0.44 - 1.00 mg/dL   Calcium 9.5 8.9 - 10.3 mg/dL   Total Protein 7.1 6.5 - 8.1 g/dL   Albumin 4.3 3.5 - 5.0 g/dL   AST 54 (H) 15 - 41 U/L   ALT 79 (H) 0 - 44 U/L   Alkaline Phosphatase 48 38 - 126 U/L   Total Bilirubin 1.1 0.3 - 1.2 mg/dL   GFR, Estimated >60 >60 mL/min   Anion gap 11 5 - 15  Lipase, blood  Result Value Ref Range   Lipase 48 11 - 51 U/L  Urinalysis, Routine w reflex microscopic -Urine, Clean Catch  Result Value Ref Range   Color, Urine STRAW (A) YELLOW   APPearance CLEAR CLEAR   Specific Gravity,  Urine 1.026 1.005 - 1.030   pH 6.0 5.0 - 8.0   Glucose, UA 50 (A) NEGATIVE mg/dL   Hgb urine dipstick SMALL (A) NEGATIVE   Bilirubin Urine NEGATIVE NEGATIVE   Ketones, ur 20 (A) NEGATIVE mg/dL   Protein, ur NEGATIVE NEGATIVE mg/dL   Nitrite NEGATIVE NEGATIVE   Leukocytes,Ua MODERATE (A) NEGATIVE   RBC / HPF 0-5 0 - 5 RBC/hpf   WBC, UA 11-20 0 - 5 WBC/hpf   Bacteria, UA NONE SEEN NONE SEEN   Squamous Epithelial / HPF 0-5 0 - 5 /HPF  Troponin I (High Sensitivity)  Result Value Ref Range   Troponin I (High Sensitivity) 5 <18 ng/L   US Abdomen Limited RUQ (LIVER/GB)  Result Date: 08/04/2022 CLINICAL DATA:  Right upper quadrant pain EXAM: ULTRASOUND ABDOMEN LIMITED RIGHT UPPER QUADRANT COMPARISON:  CT 08/04/2022 FINDINGS: Gallbladder: No gallstones or wall thickening visualized. No sonographic  Murphy sign noted by sonographer. Common bile duct: Diameter: 3.8 mm Liver: Echogenic liver parenchyma. No focal hepatic abnormality. Portal vein is patent on color Doppler imaging with normal direction of blood flow towards the liver. Other: None. IMPRESSION: Negative for gallstones. Echogenic liver parenchyma consistent with hepatic steatosis and or hepatocellular disease. Electronically Signed   By: Donavan Foil M.D.   On: 08/04/2022 22:31   CT Angio Abd/Pel W and/or Wo Contrast  Result Date: 08/04/2022 CLINICAL DATA:  Mesenteric ischemia, chronic. Nausea, vomiting, and diarrhea for the past 3 days. EXAM: CTA ABDOMEN AND PELVIS WITHOUT AND WITH CONTRAST TECHNIQUE: Multidetector CT imaging of the abdomen and pelvis was performed using the standard protocol during bolus administration of intravenous contrast. Multiplanar reconstructed images and MIPs were obtained and reviewed to evaluate the vascular anatomy. RADIATION DOSE REDUCTION: This exam was performed according to the departmental dose-optimization program which includes automated exposure control, adjustment of the mA and/or kV according to patient  size and/or use of iterative reconstruction technique. CONTRAST:  147mL OMNIPAQUE IOHEXOL 350 MG/ML SOLN COMPARISON:  06/19/2022. FINDINGS: VASCULAR Aorta: Normal caliber aorta without aneurysm, dissection, vasculitis or significant stenosis. Aortic atherosclerosis. Celiac: Patent without evidence of aneurysm, dissection, vasculitis or significant stenosis. SMA: Patent without evidence of aneurysm, dissection, vasculitis or significant stenosis. Renals: Both renal arteries are patent without evidence of aneurysm, dissection, vasculitis, fibromuscular dysplasia or significant stenosis. IMA: Patent. Inflow: Patent without evidence of aneurysm, dissection, vasculitis or significant stenosis. Proximal Outflow: Bilateral common femoral and visualized portions of the superficial and profunda femoral arteries are patent without evidence of aneurysm, dissection, vasculitis or significant stenosis. Veins: No obvious venous abnormality within the limitations of this arterial phase study. Review of the MIP images confirms the above findings. NON-VASCULAR Lower chest: The heart is enlarged and there is no pericardial effusion. Atelectasis is present at the lung bases. Hepatobiliary: There is focal fatty infiltration in the left lobe of the liver adjacent to the falciform ligament. No biliary ductal dilatation. The gallbladder is without stones. Pancreas: Unremarkable. No pancreatic ductal dilatation or surrounding inflammatory changes. Spleen: Normal in size without focal abnormality. Adrenals/Urinary Tract: No adrenal nodule or mass. The kidneys enhance symmetrically. Subcentimeter hypodensities are present in the left kidney, too small to further characterize. No renal calculus or hydronephrosis. The bladder is unremarkable. Stomach/Bowel: Stomach is within normal limits. Right hemicolectomy changes are noted. No evidence of bowel wall thickening, distention, or inflammatory changes. No free air or pneumatosis. Scattered  diverticula are present along the colon without evidence of diverticulitis. Lymphatic: No abdominal or pelvic lymphadenopathy. Reproductive: Status post hysterectomy. No adnexal masses. Other: No abdominopelvic ascites. Musculoskeletal: Degenerative changes are present in the thoracolumbar spine. Fixation hardware is noted in the lumbar spine from L2-L5. IMPRESSION: VASCULAR 1. No evidence of mesenteric ischemia. 2. Atherosclerotic calcification of the aorta without evidence of aneurysm or dissection. NON-VASCULAR 1. No acute process in the abdomen and pelvis. 2. Colonic diverticulosis without diverticulitis. 3. Cardiomegaly. Electronically Signed   By: Brett Fairy M.D.   On: 08/04/2022 22:27   CT HEAD WO CONTRAST (5MM)  Result Date: 08/01/2022 CLINICAL DATA:  Weakness EXAM: CT HEAD WITHOUT CONTRAST TECHNIQUE: Contiguous axial images were obtained from the base of the skull through the vertex without intravenous contrast. RADIATION DOSE REDUCTION: This exam was performed according to the departmental dose-optimization program which includes automated exposure control, adjustment of the mA and/or kV according to patient size and/or use of iterative reconstruction technique. COMPARISON:  CT head 02/20/2022 FINDINGS:  Brain: Cerebral ventricle sizes are concordant with the degree of cerebral volume loss. Patchy and confluent areas of decreased attenuation are noted throughout the deep and periventricular white matter of the cerebral hemispheres bilaterally, compatible with chronic microvascular ischemic disease. no evidence of large-territorial acute infarction. No parenchymal hemorrhage. No mass lesion. No extra-axial collection. No mass effect or midline shift. No hydrocephalus. Basilar cisterns are patent. Vascular: No hyperdense vessel. Skull: No acute fracture or focal lesion. Sinuses/Orbits: Right sphenoid sinus polypoid like mucosal thickening. Paranasal sinuses and mastoid air cells are clear. Bilateral  lens replacement. Otherwise the orbits are unremarkable. Other: None. IMPRESSION: No acute intracranial abnormality. Electronically Signed   By: Iven Finn M.D.   On: 08/01/2022 00:06   DG Chest 2 View  Result Date: 07/31/2022 CLINICAL DATA:  Shortness of breath EXAM: CHEST - 2 VIEW COMPARISON:  02/20/2022 FINDINGS: Cardiac shadow is enlarged but accentuated by the portable technique. Lungs are well aerated bilaterally with minimal left basilar atelectasis. No acute bony abnormality is noted. Postsurgical changes in the lumbar spine are noted. IMPRESSION: Minimal left basilar atelectasis. Electronically Signed   By: Inez Catalina M.D.   On: 07/31/2022 20:27    CT without acute findings.  [place] with likely some findings of fatty liver.  LFT's minimally elevated.  Symptoms remain controlled here without active emesis.  Stable for discharge.    Rx zofran.  Encouraged to follow-up with PCP, recommended to have LFT"s re-checked in the next few weeks. Can return here for new concerns.   Larene Pickett, PA-C [id number]    Delora Fuel, MD [id number] [phone]

## 2022-08-05 NOTE — ED Notes (Signed)
[facility] called for pt transport home 

## 2022-08-06 DIAGNOSIS — M4310 Spondylolisthesis, site unspecified: Secondary | ICD-10-CM | POA: Diagnosis not present

## 2022-08-06 DIAGNOSIS — Z85038 Personal history of other malignant neoplasm of large intestine: Secondary | ICD-10-CM | POA: Diagnosis not present

## 2022-08-06 DIAGNOSIS — H548 Legal blindness, as defined in USA: Secondary | ICD-10-CM | POA: Diagnosis not present

## 2022-08-06 DIAGNOSIS — E876 Hypokalemia: Secondary | ICD-10-CM | POA: Diagnosis not present

## 2022-08-06 DIAGNOSIS — H3552 Pigmentary retinal dystrophy: Secondary | ICD-10-CM | POA: Diagnosis not present

## 2022-08-06 DIAGNOSIS — E669 Obesity, unspecified: Secondary | ICD-10-CM | POA: Diagnosis not present

## 2022-08-06 DIAGNOSIS — J9811 Atelectasis: Secondary | ICD-10-CM | POA: Diagnosis not present

## 2022-08-06 DIAGNOSIS — Z9181 History of falling: Secondary | ICD-10-CM | POA: Diagnosis not present

## 2022-08-06 DIAGNOSIS — M519 Unspecified thoracic, thoracolumbar and lumbosacral intervertebral disc disorder: Secondary | ICD-10-CM | POA: Diagnosis not present

## 2022-08-06 DIAGNOSIS — I10 Essential (primary) hypertension: Secondary | ICD-10-CM | POA: Diagnosis not present

## 2022-08-06 DIAGNOSIS — Z6830 Body mass index (BMI) 30.0-30.9, adult: Secondary | ICD-10-CM | POA: Diagnosis not present

## 2022-08-06 DIAGNOSIS — G8929 Other chronic pain: Secondary | ICD-10-CM | POA: Diagnosis not present

## 2022-08-06 DIAGNOSIS — F419 Anxiety disorder, unspecified: Secondary | ICD-10-CM | POA: Diagnosis not present

## 2022-08-09 ENCOUNTER — Encounter: Payer: Self-pay | Admitting: Internal Medicine

## 2022-08-09 DIAGNOSIS — R5383 Other fatigue: Secondary | ICD-10-CM | POA: Diagnosis not present

## 2022-08-09 DIAGNOSIS — R748 Abnormal levels of other serum enzymes: Secondary | ICD-10-CM | POA: Diagnosis not present

## 2022-08-09 DIAGNOSIS — I10 Essential (primary) hypertension: Secondary | ICD-10-CM | POA: Diagnosis not present

## 2022-08-09 DIAGNOSIS — H6123 Impacted cerumen, bilateral: Secondary | ICD-10-CM | POA: Diagnosis not present

## 2022-08-09 DIAGNOSIS — R110 Nausea: Secondary | ICD-10-CM | POA: Diagnosis not present

## 2022-08-09 DIAGNOSIS — H9193 Unspecified hearing loss, bilateral: Secondary | ICD-10-CM | POA: Diagnosis not present

## 2022-08-09 DIAGNOSIS — K219 Gastro-esophageal reflux disease without esophagitis: Secondary | ICD-10-CM | POA: Diagnosis not present

## 2022-08-10 DIAGNOSIS — G8929 Other chronic pain: Secondary | ICD-10-CM | POA: Diagnosis not present

## 2022-08-10 DIAGNOSIS — Z9181 History of falling: Secondary | ICD-10-CM | POA: Diagnosis not present

## 2022-08-10 DIAGNOSIS — H3552 Pigmentary retinal dystrophy: Secondary | ICD-10-CM | POA: Diagnosis not present

## 2022-08-10 DIAGNOSIS — E876 Hypokalemia: Secondary | ICD-10-CM | POA: Diagnosis not present

## 2022-08-10 DIAGNOSIS — M519 Unspecified thoracic, thoracolumbar and lumbosacral intervertebral disc disorder: Secondary | ICD-10-CM | POA: Diagnosis not present

## 2022-08-10 DIAGNOSIS — J9811 Atelectasis: Secondary | ICD-10-CM | POA: Diagnosis not present

## 2022-08-10 DIAGNOSIS — Z85038 Personal history of other malignant neoplasm of large intestine: Secondary | ICD-10-CM | POA: Diagnosis not present

## 2022-08-10 DIAGNOSIS — H548 Legal blindness, as defined in USA: Secondary | ICD-10-CM | POA: Diagnosis not present

## 2022-08-10 DIAGNOSIS — E669 Obesity, unspecified: Secondary | ICD-10-CM | POA: Diagnosis not present

## 2022-08-10 DIAGNOSIS — M4310 Spondylolisthesis, site unspecified: Secondary | ICD-10-CM | POA: Diagnosis not present

## 2022-08-10 DIAGNOSIS — F419 Anxiety disorder, unspecified: Secondary | ICD-10-CM | POA: Diagnosis not present

## 2022-08-10 DIAGNOSIS — I10 Essential (primary) hypertension: Secondary | ICD-10-CM | POA: Diagnosis not present

## 2022-08-10 DIAGNOSIS — Z6830 Body mass index (BMI) 30.0-30.9, adult: Secondary | ICD-10-CM | POA: Diagnosis not present

## 2022-08-19 ENCOUNTER — Telehealth: Payer: Self-pay | Admitting: Internal Medicine

## 2022-08-19 NOTE — Telephone Encounter (Signed)
Spoke with pt and she is aware. States she will try to find transportation for her appt.

## 2022-08-19 NOTE — Telephone Encounter (Signed)
She is welcome to have a phone visit with one of the extenders

## 2022-08-19 NOTE — Telephone Encounter (Signed)
Patient is calling wants to know if it's possible to do her follow up on 4/15 over the phone, states since she is blind and doesn't drive she may not be able to get a ride and doesn't want to pay someone to bring her if it can be done over the phone. Please advise

## 2022-08-19 NOTE — Telephone Encounter (Signed)
See note below and advise. 

## 2022-08-20 DIAGNOSIS — I10 Essential (primary) hypertension: Secondary | ICD-10-CM | POA: Diagnosis not present

## 2022-08-20 DIAGNOSIS — J9811 Atelectasis: Secondary | ICD-10-CM | POA: Diagnosis not present

## 2022-08-20 DIAGNOSIS — M4310 Spondylolisthesis, site unspecified: Secondary | ICD-10-CM | POA: Diagnosis not present

## 2022-08-20 DIAGNOSIS — H548 Legal blindness, as defined in USA: Secondary | ICD-10-CM | POA: Diagnosis not present

## 2022-08-20 DIAGNOSIS — Z9181 History of falling: Secondary | ICD-10-CM | POA: Diagnosis not present

## 2022-08-20 DIAGNOSIS — E876 Hypokalemia: Secondary | ICD-10-CM | POA: Diagnosis not present

## 2022-08-20 DIAGNOSIS — M519 Unspecified thoracic, thoracolumbar and lumbosacral intervertebral disc disorder: Secondary | ICD-10-CM | POA: Diagnosis not present

## 2022-08-20 DIAGNOSIS — Z85038 Personal history of other malignant neoplasm of large intestine: Secondary | ICD-10-CM | POA: Diagnosis not present

## 2022-08-20 DIAGNOSIS — H3552 Pigmentary retinal dystrophy: Secondary | ICD-10-CM | POA: Diagnosis not present

## 2022-08-20 DIAGNOSIS — E669 Obesity, unspecified: Secondary | ICD-10-CM | POA: Diagnosis not present

## 2022-08-20 DIAGNOSIS — Z6830 Body mass index (BMI) 30.0-30.9, adult: Secondary | ICD-10-CM | POA: Diagnosis not present

## 2022-08-20 DIAGNOSIS — G8929 Other chronic pain: Secondary | ICD-10-CM | POA: Diagnosis not present

## 2022-08-20 DIAGNOSIS — F419 Anxiety disorder, unspecified: Secondary | ICD-10-CM | POA: Diagnosis not present

## 2022-08-23 ENCOUNTER — Ambulatory Visit (INDEPENDENT_AMBULATORY_CARE_PROVIDER_SITE_OTHER): Payer: PPO | Admitting: Internal Medicine

## 2022-08-23 ENCOUNTER — Encounter: Payer: Self-pay | Admitting: Internal Medicine

## 2022-08-23 VITALS — BP 106/80 | HR 75 | Ht 61.0 in

## 2022-08-23 DIAGNOSIS — R110 Nausea: Secondary | ICD-10-CM

## 2022-08-23 DIAGNOSIS — K5900 Constipation, unspecified: Secondary | ICD-10-CM

## 2022-08-23 DIAGNOSIS — R1010 Upper abdominal pain, unspecified: Secondary | ICD-10-CM | POA: Diagnosis not present

## 2022-08-23 NOTE — Patient Instructions (Addendum)
Please follow up as needed.  _______________________________________________________  If your blood pressure at your visit was 140/90 or greater, please contact your primary care physician to follow up on this.  _______________________________________________________  If you are age 85 or older, your body mass index should be between 23-30. Your Body mass index is 30.23 kg/m. If this is out of the aforementioned range listed, please consider follow up with your Primary Care Provider.  If you are age 70 or younger, your body mass index should be between 19-25. Your Body mass index is 30.23 kg/m. If this is out of the aformentioned range listed, please consider follow up with your Primary Care Provider.   ________________________________________________________  The [place] [facility] providers would like to encourage you to use [facility] to communicate with providers for non-urgent requests or questions.  Due to long hold times on the telephone, sending your provider a message by [facility] may be a faster and more efficient way to get a response.  Please allow 48 business hours for a response.  Please remember that this is for non-urgent requests.  _______________________________________________________ It was a pleasure to see you today!  Thank you for trusting me with your gastrointestinal care!

## 2022-08-23 NOTE — Progress Notes (Signed)
HISTORY OF PRESENT ILLNESS:  Kathryn Romero is a 87 y.o. female with multiple medical problems as listed below who presents today for office follow-up.  He is accompanied by 2 of her nieces, 1 from [place] and 1 from [place].  I last saw the patient July 12, 2022 regarding problems with nausea and vomiting.  See that dictation.  Placed on omeprazole daily and famotidine at night.  Also prescribe Zofran.  Since that time she was seen in the emergency room March 20 3 March 27.  Negative CTA and ultrasound.  Patient tells me that since being prescribed omeprazole she has had no further problems with nausea or vomiting.  Not requiring Zofran.  There is no abdominal complaints.  Denied that she was in the emergency room for abdominal pain, contrary to the most recent documentation from March 27.  She does have questions regarding back pain, loss of her eyesight, and advised on getting a new hearing aid.  REVIEW OF SYSTEMS:  All non-GI ROS negative unless otherwise stated in the HPI except for anxiety  Past Medical History:  Diagnosis Date   ANEMIA-NOS    ANXIETY    Aortic atherosclerosis    Arthritis    Cancer 2005   colon cancer   Chronic back pain    spondylolisthesis   Diverticulosis    Fatty liver    GERD (gastroesophageal reflux disease)    History of blood transfusion    no abnormal reaction noted   History of colon cancer 2004   History of shingles    HYPERLIPIDEMIA    takes Pravastatin daily   Hypertension    Legally blind    Nausea & vomiting 02/20/2022   Nocturia    RETINITIS PIGMENTOSA    SYNDROME, CARPAL TUNNEL    Weakness    numbness and tingling in legs and feet    Past Surgical History:  Procedure Laterality Date   ABDOMINAL HYSTERECTOMY  1989   ANTERIOR LAT LUMBAR FUSION Right 01/06/2018   Procedure: Right Lumbar Two-Three Anterolateral lumbar interbody fusion with lateral plate;  Surgeon: Maeola Harman, MD;  Location: [facility] OR;  Service: Neurosurgery;  Laterality:  Right;  Right Lumbar Two-Three Anterolateral lumbar interbody fusion with lateral plate   APPENDECTOMY  1946   carapl tunnel release Left    cataract surgery Bilateral    COLONOSCOPY     MAXIMUM ACCESS (MAS)POSTERIOR LUMBAR INTERBODY FUSION (PLIF) 2 LEVEL N/A 11/14/2015   Procedure: Lumbar Four-Five Maximum access posterior lumbar interbody fusion, Lumbar Three-Four Lumbar Four-Five Posterolateral Fusion and Pedicle Screws;  Surgeon: Maeola Harman, MD;  Location: [facility];  Service: Neurosurgery;  Laterality: N/A;  L3-4 L4-5 Maximum access posterior lumbar interbody fusion   OOPHORECTOMY  1989   s/p ganglion cyst  1973   s/p right hemicolectomy  12 yrs ago    Social History Kathryn Romero  reports that she has never smoked. She has never used smokeless tobacco. She reports that she does not drink alcohol and does not use drugs.  family history includes ALS in her cousin; Breast cancer in her maternal aunt; Polymyalgia rheumatica in her cousin.  Allergies  Allergen Reactions   Codeine Shortness Of Breath   Hydromet [Hydrocodone Bit-Homatrop Mbr] Shortness Of Breath and Other (See Comments)    Wheezing   Fish Allergy Other (See Comments)    Burning Sensation and Headache   Cephalexin Nausea Only   Morphine Other (See Comments)    "feels funny"   Sulfa Antibiotics Nausea And Vomiting  PHYSICAL EXAMINATION: Vital signs: BP 106/80   Pulse 75   Ht 5\' 1"  (1.549 m)   BMI 30.23 kg/m  General: Well-developed, well-nourished, no acute distress.  In wheelchair HEENT: Icteric Abdomen: Not reexamined. Extremities: No significant edema Psychiatric: alert and oriented x3. Cooperative   ASSESSMENT:  1.  Previous problems with nausea and vomiting have resolved on PPI.  Question GERD.  Question PUD. 2.  Multiple medical problems   PLAN:  1.  Continue omeprazole daily 2.  Continue famotidine at night 3.  Continue Zofran if needed. 4.  Resume general medical care  with PCP 5.  GI follow-up as needed

## 2022-08-25 ENCOUNTER — Telehealth: Payer: Self-pay | Admitting: Internal Medicine

## 2022-08-25 DIAGNOSIS — M4310 Spondylolisthesis, site unspecified: Secondary | ICD-10-CM | POA: Diagnosis not present

## 2022-08-25 DIAGNOSIS — Z6830 Body mass index (BMI) 30.0-30.9, adult: Secondary | ICD-10-CM | POA: Diagnosis not present

## 2022-08-25 DIAGNOSIS — I10 Essential (primary) hypertension: Secondary | ICD-10-CM | POA: Diagnosis not present

## 2022-08-25 DIAGNOSIS — H548 Legal blindness, as defined in USA: Secondary | ICD-10-CM | POA: Diagnosis not present

## 2022-08-25 DIAGNOSIS — Z85038 Personal history of other malignant neoplasm of large intestine: Secondary | ICD-10-CM | POA: Diagnosis not present

## 2022-08-25 DIAGNOSIS — H3552 Pigmentary retinal dystrophy: Secondary | ICD-10-CM | POA: Diagnosis not present

## 2022-08-25 DIAGNOSIS — E669 Obesity, unspecified: Secondary | ICD-10-CM | POA: Diagnosis not present

## 2022-08-25 DIAGNOSIS — E876 Hypokalemia: Secondary | ICD-10-CM | POA: Diagnosis not present

## 2022-08-25 DIAGNOSIS — J9811 Atelectasis: Secondary | ICD-10-CM | POA: Diagnosis not present

## 2022-08-25 DIAGNOSIS — G8929 Other chronic pain: Secondary | ICD-10-CM | POA: Diagnosis not present

## 2022-08-25 DIAGNOSIS — Z9181 History of falling: Secondary | ICD-10-CM | POA: Diagnosis not present

## 2022-08-25 DIAGNOSIS — M519 Unspecified thoracic, thoracolumbar and lumbosacral intervertebral disc disorder: Secondary | ICD-10-CM | POA: Diagnosis not present

## 2022-08-25 DIAGNOSIS — F419 Anxiety disorder, unspecified: Secondary | ICD-10-CM | POA: Diagnosis not present

## 2022-08-25 NOTE — Telephone Encounter (Signed)
Patient calling wanting to know if it is okay for her to take [facility] for her back pain. Please advise

## 2022-08-26 NOTE — Telephone Encounter (Signed)
Returned call to patient. I have advised patient that Tylenol is safer for her to take. Patient states that she has this at home. Pt has been advised if she is taking Tylenol Extra strength then she can only take up to 4 in a 24 hour period. Pt verbalized understanding and had no concerns at the end of the call.

## 2022-08-31 ENCOUNTER — Other Ambulatory Visit (HOSPITAL_COMMUNITY): Payer: Self-pay

## 2022-08-31 NOTE — Telephone Encounter (Signed)
Medication is NOT COVERED, patient may use discount card

## 2022-09-23 ENCOUNTER — Ambulatory Visit: Payer: PPO | Admitting: Internal Medicine

## 2022-10-04 DIAGNOSIS — H903 Sensorineural hearing loss, bilateral: Secondary | ICD-10-CM | POA: Diagnosis not present

## 2022-12-01 ENCOUNTER — Ambulatory Visit: Payer: PPO | Admitting: Internal Medicine

## 2022-12-28 DIAGNOSIS — I10 Essential (primary) hypertension: Secondary | ICD-10-CM | POA: Diagnosis not present

## 2022-12-28 DIAGNOSIS — M6281 Muscle weakness (generalized): Secondary | ICD-10-CM | POA: Diagnosis not present

## 2022-12-28 DIAGNOSIS — K219 Gastro-esophageal reflux disease without esophagitis: Secondary | ICD-10-CM | POA: Diagnosis not present

## 2022-12-28 DIAGNOSIS — F419 Anxiety disorder, unspecified: Secondary | ICD-10-CM | POA: Diagnosis not present

## 2022-12-28 DIAGNOSIS — H548 Legal blindness, as defined in USA: Secondary | ICD-10-CM | POA: Diagnosis not present

## 2022-12-28 DIAGNOSIS — R5381 Other malaise: Secondary | ICD-10-CM | POA: Diagnosis not present

## 2022-12-28 DIAGNOSIS — H3552 Pigmentary retinal dystrophy: Secondary | ICD-10-CM | POA: Diagnosis not present

## 2022-12-30 DIAGNOSIS — H3552 Pigmentary retinal dystrophy: Secondary | ICD-10-CM | POA: Diagnosis not present

## 2022-12-30 DIAGNOSIS — E1139 Type 2 diabetes mellitus with other diabetic ophthalmic complication: Secondary | ICD-10-CM | POA: Diagnosis not present

## 2022-12-30 DIAGNOSIS — H548 Legal blindness, as defined in USA: Secondary | ICD-10-CM | POA: Diagnosis not present

## 2023-03-03 DIAGNOSIS — M25512 Pain in left shoulder: Secondary | ICD-10-CM | POA: Diagnosis not present

## 2023-03-03 DIAGNOSIS — M25511 Pain in right shoulder: Secondary | ICD-10-CM | POA: Diagnosis not present

## 2023-03-03 DIAGNOSIS — G629 Polyneuropathy, unspecified: Secondary | ICD-10-CM | POA: Diagnosis not present

## 2023-03-03 DIAGNOSIS — M6281 Muscle weakness (generalized): Secondary | ICD-10-CM | POA: Diagnosis not present

## 2023-03-03 DIAGNOSIS — R5381 Other malaise: Secondary | ICD-10-CM | POA: Diagnosis not present

## 2023-03-03 DIAGNOSIS — N390 Urinary tract infection, site not specified: Secondary | ICD-10-CM | POA: Diagnosis not present

## 2023-03-03 DIAGNOSIS — G8929 Other chronic pain: Secondary | ICD-10-CM | POA: Diagnosis not present

## 2023-03-03 DIAGNOSIS — R2689 Other abnormalities of gait and mobility: Secondary | ICD-10-CM | POA: Diagnosis not present

## 2023-03-06 DIAGNOSIS — M25512 Pain in left shoulder: Secondary | ICD-10-CM | POA: Diagnosis not present

## 2023-03-06 DIAGNOSIS — M25511 Pain in right shoulder: Secondary | ICD-10-CM | POA: Diagnosis not present

## 2023-03-06 DIAGNOSIS — M1990 Unspecified osteoarthritis, unspecified site: Secondary | ICD-10-CM | POA: Diagnosis not present

## 2023-03-06 DIAGNOSIS — F419 Anxiety disorder, unspecified: Secondary | ICD-10-CM | POA: Diagnosis not present

## 2023-03-06 DIAGNOSIS — I10 Essential (primary) hypertension: Secondary | ICD-10-CM | POA: Diagnosis not present

## 2023-03-06 DIAGNOSIS — M4316 Spondylolisthesis, lumbar region: Secondary | ICD-10-CM | POA: Diagnosis not present

## 2023-03-06 DIAGNOSIS — G8929 Other chronic pain: Secondary | ICD-10-CM | POA: Diagnosis not present

## 2023-03-06 DIAGNOSIS — M4326 Fusion of spine, lumbar region: Secondary | ICD-10-CM | POA: Diagnosis not present

## 2023-03-06 DIAGNOSIS — M48061 Spinal stenosis, lumbar region without neurogenic claudication: Secondary | ICD-10-CM | POA: Diagnosis not present

## 2023-03-09 DIAGNOSIS — M25512 Pain in left shoulder: Secondary | ICD-10-CM | POA: Diagnosis not present

## 2023-03-09 DIAGNOSIS — M4316 Spondylolisthesis, lumbar region: Secondary | ICD-10-CM | POA: Diagnosis not present

## 2023-03-09 DIAGNOSIS — G8929 Other chronic pain: Secondary | ICD-10-CM | POA: Diagnosis not present

## 2023-03-09 DIAGNOSIS — M48061 Spinal stenosis, lumbar region without neurogenic claudication: Secondary | ICD-10-CM | POA: Diagnosis not present

## 2023-03-09 DIAGNOSIS — I10 Essential (primary) hypertension: Secondary | ICD-10-CM | POA: Diagnosis not present

## 2023-03-09 DIAGNOSIS — M25511 Pain in right shoulder: Secondary | ICD-10-CM | POA: Diagnosis not present

## 2023-03-09 DIAGNOSIS — F419 Anxiety disorder, unspecified: Secondary | ICD-10-CM | POA: Diagnosis not present

## 2023-03-09 DIAGNOSIS — M1990 Unspecified osteoarthritis, unspecified site: Secondary | ICD-10-CM | POA: Diagnosis not present

## 2023-03-09 DIAGNOSIS — M4326 Fusion of spine, lumbar region: Secondary | ICD-10-CM | POA: Diagnosis not present

## 2023-03-11 DIAGNOSIS — M4326 Fusion of spine, lumbar region: Secondary | ICD-10-CM | POA: Diagnosis not present

## 2023-03-11 DIAGNOSIS — M4316 Spondylolisthesis, lumbar region: Secondary | ICD-10-CM | POA: Diagnosis not present

## 2023-03-11 DIAGNOSIS — G8929 Other chronic pain: Secondary | ICD-10-CM | POA: Diagnosis not present

## 2023-03-11 DIAGNOSIS — M48061 Spinal stenosis, lumbar region without neurogenic claudication: Secondary | ICD-10-CM | POA: Diagnosis not present

## 2023-03-11 DIAGNOSIS — M1990 Unspecified osteoarthritis, unspecified site: Secondary | ICD-10-CM | POA: Diagnosis not present

## 2023-03-11 DIAGNOSIS — I10 Essential (primary) hypertension: Secondary | ICD-10-CM | POA: Diagnosis not present

## 2023-03-11 DIAGNOSIS — M25512 Pain in left shoulder: Secondary | ICD-10-CM | POA: Diagnosis not present

## 2023-03-11 DIAGNOSIS — F419 Anxiety disorder, unspecified: Secondary | ICD-10-CM | POA: Diagnosis not present

## 2023-03-11 DIAGNOSIS — M25511 Pain in right shoulder: Secondary | ICD-10-CM | POA: Diagnosis not present

## 2023-03-14 DIAGNOSIS — M4326 Fusion of spine, lumbar region: Secondary | ICD-10-CM | POA: Diagnosis not present

## 2023-03-14 DIAGNOSIS — M1990 Unspecified osteoarthritis, unspecified site: Secondary | ICD-10-CM | POA: Diagnosis not present

## 2023-03-14 DIAGNOSIS — M25511 Pain in right shoulder: Secondary | ICD-10-CM | POA: Diagnosis not present

## 2023-03-14 DIAGNOSIS — G8929 Other chronic pain: Secondary | ICD-10-CM | POA: Diagnosis not present

## 2023-03-14 DIAGNOSIS — M4316 Spondylolisthesis, lumbar region: Secondary | ICD-10-CM | POA: Diagnosis not present

## 2023-03-14 DIAGNOSIS — F419 Anxiety disorder, unspecified: Secondary | ICD-10-CM | POA: Diagnosis not present

## 2023-03-14 DIAGNOSIS — M25512 Pain in left shoulder: Secondary | ICD-10-CM | POA: Diagnosis not present

## 2023-03-14 DIAGNOSIS — M48061 Spinal stenosis, lumbar region without neurogenic claudication: Secondary | ICD-10-CM | POA: Diagnosis not present

## 2023-03-14 DIAGNOSIS — I10 Essential (primary) hypertension: Secondary | ICD-10-CM | POA: Diagnosis not present

## 2023-03-16 DIAGNOSIS — I10 Essential (primary) hypertension: Secondary | ICD-10-CM | POA: Diagnosis not present

## 2023-03-16 DIAGNOSIS — M4326 Fusion of spine, lumbar region: Secondary | ICD-10-CM | POA: Diagnosis not present

## 2023-03-16 DIAGNOSIS — G8929 Other chronic pain: Secondary | ICD-10-CM | POA: Diagnosis not present

## 2023-03-16 DIAGNOSIS — M1990 Unspecified osteoarthritis, unspecified site: Secondary | ICD-10-CM | POA: Diagnosis not present

## 2023-03-16 DIAGNOSIS — M25511 Pain in right shoulder: Secondary | ICD-10-CM | POA: Diagnosis not present

## 2023-03-16 DIAGNOSIS — M48061 Spinal stenosis, lumbar region without neurogenic claudication: Secondary | ICD-10-CM | POA: Diagnosis not present

## 2023-03-16 DIAGNOSIS — M4316 Spondylolisthesis, lumbar region: Secondary | ICD-10-CM | POA: Diagnosis not present

## 2023-03-16 DIAGNOSIS — F419 Anxiety disorder, unspecified: Secondary | ICD-10-CM | POA: Diagnosis not present

## 2023-03-16 DIAGNOSIS — M25512 Pain in left shoulder: Secondary | ICD-10-CM | POA: Diagnosis not present

## 2023-03-17 DIAGNOSIS — M4326 Fusion of spine, lumbar region: Secondary | ICD-10-CM | POA: Diagnosis not present

## 2023-03-17 DIAGNOSIS — I10 Essential (primary) hypertension: Secondary | ICD-10-CM | POA: Diagnosis not present

## 2023-03-17 DIAGNOSIS — M48061 Spinal stenosis, lumbar region without neurogenic claudication: Secondary | ICD-10-CM | POA: Diagnosis not present

## 2023-03-17 DIAGNOSIS — F419 Anxiety disorder, unspecified: Secondary | ICD-10-CM | POA: Diagnosis not present

## 2023-03-17 DIAGNOSIS — M1990 Unspecified osteoarthritis, unspecified site: Secondary | ICD-10-CM | POA: Diagnosis not present

## 2023-03-17 DIAGNOSIS — M25511 Pain in right shoulder: Secondary | ICD-10-CM | POA: Diagnosis not present

## 2023-03-17 DIAGNOSIS — M4316 Spondylolisthesis, lumbar region: Secondary | ICD-10-CM | POA: Diagnosis not present

## 2023-03-17 DIAGNOSIS — G8929 Other chronic pain: Secondary | ICD-10-CM | POA: Diagnosis not present

## 2023-03-17 DIAGNOSIS — M25512 Pain in left shoulder: Secondary | ICD-10-CM | POA: Diagnosis not present

## 2023-03-18 DIAGNOSIS — M25512 Pain in left shoulder: Secondary | ICD-10-CM | POA: Diagnosis not present

## 2023-03-18 DIAGNOSIS — M1990 Unspecified osteoarthritis, unspecified site: Secondary | ICD-10-CM | POA: Diagnosis not present

## 2023-03-18 DIAGNOSIS — M4316 Spondylolisthesis, lumbar region: Secondary | ICD-10-CM | POA: Diagnosis not present

## 2023-03-18 DIAGNOSIS — I10 Essential (primary) hypertension: Secondary | ICD-10-CM | POA: Diagnosis not present

## 2023-03-18 DIAGNOSIS — M25511 Pain in right shoulder: Secondary | ICD-10-CM | POA: Diagnosis not present

## 2023-03-18 DIAGNOSIS — F419 Anxiety disorder, unspecified: Secondary | ICD-10-CM | POA: Diagnosis not present

## 2023-03-18 DIAGNOSIS — M48061 Spinal stenosis, lumbar region without neurogenic claudication: Secondary | ICD-10-CM | POA: Diagnosis not present

## 2023-03-18 DIAGNOSIS — G8929 Other chronic pain: Secondary | ICD-10-CM | POA: Diagnosis not present

## 2023-03-18 DIAGNOSIS — M4326 Fusion of spine, lumbar region: Secondary | ICD-10-CM | POA: Diagnosis not present

## 2023-03-23 DIAGNOSIS — M4316 Spondylolisthesis, lumbar region: Secondary | ICD-10-CM | POA: Diagnosis not present

## 2023-03-23 DIAGNOSIS — F419 Anxiety disorder, unspecified: Secondary | ICD-10-CM | POA: Diagnosis not present

## 2023-03-23 DIAGNOSIS — M48061 Spinal stenosis, lumbar region without neurogenic claudication: Secondary | ICD-10-CM | POA: Diagnosis not present

## 2023-03-23 DIAGNOSIS — M25512 Pain in left shoulder: Secondary | ICD-10-CM | POA: Diagnosis not present

## 2023-03-23 DIAGNOSIS — G8929 Other chronic pain: Secondary | ICD-10-CM | POA: Diagnosis not present

## 2023-03-23 DIAGNOSIS — M1990 Unspecified osteoarthritis, unspecified site: Secondary | ICD-10-CM | POA: Diagnosis not present

## 2023-03-23 DIAGNOSIS — M25511 Pain in right shoulder: Secondary | ICD-10-CM | POA: Diagnosis not present

## 2023-03-23 DIAGNOSIS — M4326 Fusion of spine, lumbar region: Secondary | ICD-10-CM | POA: Diagnosis not present

## 2023-03-23 DIAGNOSIS — I10 Essential (primary) hypertension: Secondary | ICD-10-CM | POA: Diagnosis not present

## 2023-03-24 DIAGNOSIS — M25512 Pain in left shoulder: Secondary | ICD-10-CM | POA: Diagnosis not present

## 2023-03-24 DIAGNOSIS — I10 Essential (primary) hypertension: Secondary | ICD-10-CM | POA: Diagnosis not present

## 2023-03-24 DIAGNOSIS — M1990 Unspecified osteoarthritis, unspecified site: Secondary | ICD-10-CM | POA: Diagnosis not present

## 2023-03-24 DIAGNOSIS — G8929 Other chronic pain: Secondary | ICD-10-CM | POA: Diagnosis not present

## 2023-03-24 DIAGNOSIS — M4316 Spondylolisthesis, lumbar region: Secondary | ICD-10-CM | POA: Diagnosis not present

## 2023-03-24 DIAGNOSIS — M4326 Fusion of spine, lumbar region: Secondary | ICD-10-CM | POA: Diagnosis not present

## 2023-03-24 DIAGNOSIS — M25511 Pain in right shoulder: Secondary | ICD-10-CM | POA: Diagnosis not present

## 2023-03-24 DIAGNOSIS — M48061 Spinal stenosis, lumbar region without neurogenic claudication: Secondary | ICD-10-CM | POA: Diagnosis not present

## 2023-03-24 DIAGNOSIS — F419 Anxiety disorder, unspecified: Secondary | ICD-10-CM | POA: Diagnosis not present

## 2023-03-30 DIAGNOSIS — M25512 Pain in left shoulder: Secondary | ICD-10-CM | POA: Diagnosis not present

## 2023-03-30 DIAGNOSIS — M25511 Pain in right shoulder: Secondary | ICD-10-CM | POA: Diagnosis not present

## 2023-03-30 DIAGNOSIS — M19012 Primary osteoarthritis, left shoulder: Secondary | ICD-10-CM | POA: Diagnosis not present

## 2023-03-31 DIAGNOSIS — G8929 Other chronic pain: Secondary | ICD-10-CM | POA: Diagnosis not present

## 2023-03-31 DIAGNOSIS — M48061 Spinal stenosis, lumbar region without neurogenic claudication: Secondary | ICD-10-CM | POA: Diagnosis not present

## 2023-03-31 DIAGNOSIS — M4316 Spondylolisthesis, lumbar region: Secondary | ICD-10-CM | POA: Diagnosis not present

## 2023-03-31 DIAGNOSIS — M4326 Fusion of spine, lumbar region: Secondary | ICD-10-CM | POA: Diagnosis not present

## 2023-03-31 DIAGNOSIS — F419 Anxiety disorder, unspecified: Secondary | ICD-10-CM | POA: Diagnosis not present

## 2023-03-31 DIAGNOSIS — M25511 Pain in right shoulder: Secondary | ICD-10-CM | POA: Diagnosis not present

## 2023-03-31 DIAGNOSIS — I10 Essential (primary) hypertension: Secondary | ICD-10-CM | POA: Diagnosis not present

## 2023-03-31 DIAGNOSIS — M1990 Unspecified osteoarthritis, unspecified site: Secondary | ICD-10-CM | POA: Diagnosis not present

## 2023-03-31 DIAGNOSIS — M25512 Pain in left shoulder: Secondary | ICD-10-CM | POA: Diagnosis not present

## 2023-04-04 DIAGNOSIS — M4316 Spondylolisthesis, lumbar region: Secondary | ICD-10-CM | POA: Diagnosis not present

## 2023-04-04 DIAGNOSIS — M1990 Unspecified osteoarthritis, unspecified site: Secondary | ICD-10-CM | POA: Diagnosis not present

## 2023-04-04 DIAGNOSIS — F419 Anxiety disorder, unspecified: Secondary | ICD-10-CM | POA: Diagnosis not present

## 2023-04-04 DIAGNOSIS — G8929 Other chronic pain: Secondary | ICD-10-CM | POA: Diagnosis not present

## 2023-04-04 DIAGNOSIS — M25511 Pain in right shoulder: Secondary | ICD-10-CM | POA: Diagnosis not present

## 2023-04-04 DIAGNOSIS — M4326 Fusion of spine, lumbar region: Secondary | ICD-10-CM | POA: Diagnosis not present

## 2023-04-04 DIAGNOSIS — M25512 Pain in left shoulder: Secondary | ICD-10-CM | POA: Diagnosis not present

## 2023-04-04 DIAGNOSIS — M48061 Spinal stenosis, lumbar region without neurogenic claudication: Secondary | ICD-10-CM | POA: Diagnosis not present

## 2023-04-04 DIAGNOSIS — I10 Essential (primary) hypertension: Secondary | ICD-10-CM | POA: Diagnosis not present

## 2023-04-12 DIAGNOSIS — M25511 Pain in right shoulder: Secondary | ICD-10-CM | POA: Diagnosis not present

## 2023-04-12 DIAGNOSIS — M25512 Pain in left shoulder: Secondary | ICD-10-CM | POA: Diagnosis not present

## 2023-04-13 ENCOUNTER — Other Ambulatory Visit: Payer: Self-pay | Admitting: Sports Medicine

## 2023-04-13 DIAGNOSIS — M25511 Pain in right shoulder: Secondary | ICD-10-CM

## 2023-04-15 DIAGNOSIS — N390 Urinary tract infection, site not specified: Secondary | ICD-10-CM | POA: Diagnosis not present

## 2023-04-18 DIAGNOSIS — F419 Anxiety disorder, unspecified: Secondary | ICD-10-CM | POA: Diagnosis not present

## 2023-04-18 DIAGNOSIS — M25512 Pain in left shoulder: Secondary | ICD-10-CM | POA: Diagnosis not present

## 2023-04-18 DIAGNOSIS — M48061 Spinal stenosis, lumbar region without neurogenic claudication: Secondary | ICD-10-CM | POA: Diagnosis not present

## 2023-04-18 DIAGNOSIS — M1990 Unspecified osteoarthritis, unspecified site: Secondary | ICD-10-CM | POA: Diagnosis not present

## 2023-04-18 DIAGNOSIS — G8929 Other chronic pain: Secondary | ICD-10-CM | POA: Diagnosis not present

## 2023-04-18 DIAGNOSIS — M25511 Pain in right shoulder: Secondary | ICD-10-CM | POA: Diagnosis not present

## 2023-04-18 DIAGNOSIS — I10 Essential (primary) hypertension: Secondary | ICD-10-CM | POA: Diagnosis not present

## 2023-04-18 DIAGNOSIS — M4326 Fusion of spine, lumbar region: Secondary | ICD-10-CM | POA: Diagnosis not present

## 2023-04-18 DIAGNOSIS — M4316 Spondylolisthesis, lumbar region: Secondary | ICD-10-CM | POA: Diagnosis not present

## 2023-04-19 DIAGNOSIS — R051 Acute cough: Secondary | ICD-10-CM | POA: Diagnosis not present

## 2023-04-19 DIAGNOSIS — J019 Acute sinusitis, unspecified: Secondary | ICD-10-CM | POA: Diagnosis not present

## 2023-04-21 DIAGNOSIS — M1990 Unspecified osteoarthritis, unspecified site: Secondary | ICD-10-CM | POA: Diagnosis not present

## 2023-04-21 DIAGNOSIS — M4326 Fusion of spine, lumbar region: Secondary | ICD-10-CM | POA: Diagnosis not present

## 2023-04-21 DIAGNOSIS — M4316 Spondylolisthesis, lumbar region: Secondary | ICD-10-CM | POA: Diagnosis not present

## 2023-04-21 DIAGNOSIS — G8929 Other chronic pain: Secondary | ICD-10-CM | POA: Diagnosis not present

## 2023-04-21 DIAGNOSIS — I10 Essential (primary) hypertension: Secondary | ICD-10-CM | POA: Diagnosis not present

## 2023-04-21 DIAGNOSIS — M25512 Pain in left shoulder: Secondary | ICD-10-CM | POA: Diagnosis not present

## 2023-04-21 DIAGNOSIS — F419 Anxiety disorder, unspecified: Secondary | ICD-10-CM | POA: Diagnosis not present

## 2023-04-21 DIAGNOSIS — M25511 Pain in right shoulder: Secondary | ICD-10-CM | POA: Diagnosis not present

## 2023-04-21 DIAGNOSIS — M48061 Spinal stenosis, lumbar region without neurogenic claudication: Secondary | ICD-10-CM | POA: Diagnosis not present

## 2023-05-02 DIAGNOSIS — M4316 Spondylolisthesis, lumbar region: Secondary | ICD-10-CM | POA: Diagnosis not present

## 2023-05-02 DIAGNOSIS — I10 Essential (primary) hypertension: Secondary | ICD-10-CM | POA: Diagnosis not present

## 2023-05-02 DIAGNOSIS — M25511 Pain in right shoulder: Secondary | ICD-10-CM | POA: Diagnosis not present

## 2023-05-02 DIAGNOSIS — F419 Anxiety disorder, unspecified: Secondary | ICD-10-CM | POA: Diagnosis not present

## 2023-05-02 DIAGNOSIS — M48061 Spinal stenosis, lumbar region without neurogenic claudication: Secondary | ICD-10-CM | POA: Diagnosis not present

## 2023-05-02 DIAGNOSIS — M4326 Fusion of spine, lumbar region: Secondary | ICD-10-CM | POA: Diagnosis not present

## 2023-05-02 DIAGNOSIS — M1990 Unspecified osteoarthritis, unspecified site: Secondary | ICD-10-CM | POA: Diagnosis not present

## 2023-05-02 DIAGNOSIS — G8929 Other chronic pain: Secondary | ICD-10-CM | POA: Diagnosis not present

## 2023-05-02 DIAGNOSIS — M25512 Pain in left shoulder: Secondary | ICD-10-CM | POA: Diagnosis not present

## 2023-05-11 ENCOUNTER — Ambulatory Visit (HOSPITAL_COMMUNITY)
Admission: EM | Admit: 2023-05-11 | Discharge: 2023-05-11 | Disposition: A | Discharge status: Home / Self Care | Payer: Medicare HMO | Attending: Physician Assistant | Admitting: Physician Assistant

## 2023-05-11 ENCOUNTER — Encounter (HOSPITAL_COMMUNITY): Payer: Self-pay

## 2023-05-11 DIAGNOSIS — R350 Frequency of micturition: Secondary | ICD-10-CM | POA: Diagnosis present

## 2023-05-11 DIAGNOSIS — N3000 Acute cystitis without hematuria: Secondary | ICD-10-CM

## 2023-05-11 DIAGNOSIS — R410 Disorientation, unspecified: Secondary | ICD-10-CM | POA: Diagnosis not present

## 2023-05-11 LAB — POCT URINALYSIS DIP (MANUAL ENTRY)
Glucose, UA: NEGATIVE mg/dL
Ketones, POC UA: NEGATIVE mg/dL
Nitrite, UA: NEGATIVE
Protein Ur, POC: 100 mg/dL — AB
Spec Grav, UA: >=1.03 — AB (ref 1.010–1.025)
Urobilinogen, UA: 1 U/dL
pH, UA: 5.5 (ref 5.0–8.0)

## 2023-05-11 MED ORDER — ONDANSETRON HCL 4 MG PO TABS: 4.0000 mg | ORAL_TABLET | Freq: Three times a day (TID) | ORAL | 0 refills | Status: DC | PRN

## 2023-05-11 MED ORDER — CIPROFLOXACIN HCL 500 MG PO TABS: 500.0000 mg | ORAL_TABLET | Freq: Two times a day (BID) | ORAL | 0 refills | Status: DC

## 2023-05-11 NOTE — Discharge Instructions (Signed)
 Take antibiotic as prescribed Drink plenty of fluids Will call with urine culture results and change treatment plan if needed

## 2023-05-11 NOTE — ED Triage Notes (Signed)
 Pt presents to urgent care with daughter. Pt reports urinary frequency, increased confusion, and fatigue x 2 weeks. Pt denies fevers at home. Pt currently denies pain and denies taking medications for symptoms reported.

## 2023-05-11 NOTE — ED Provider Notes (Signed)
 [facility]    CSN: [id number] Arrival date & time: 05/11/23  1343      History   Chief Complaint Chief Complaint  Patient presents with   Urinary Frequency    HPI Kathryn Romero is a 88 y.o. female.   Patient brought in by daughter who reports 2 weeks of increased urinary frequency mild increasing confusion and fatigue.  Denies fever, chills, abdominal pain, nausea, flank pain.  Concern for urinary tract infection given increased urinary frequency and increased confusion from baseline.    Past Medical History:  Diagnosis Date   ANEMIA-NOS    ANXIETY    Aortic atherosclerosis (HCC)    Arthritis    Cancer (HCC) 2005   colon cancer   Chronic back pain    spondylolisthesis   Diverticulosis    Fatty liver    GERD (gastroesophageal reflux disease)    History of blood transfusion    no abnormal reaction noted   History of colon cancer 2004   History of shingles    HYPERLIPIDEMIA    takes Pravastatin  daily   Hypertension    Legally blind    Nausea & vomiting 02/20/2022   Nocturia    RETINITIS PIGMENTOSA    SYNDROME, CARPAL TUNNEL    Weakness    numbness and tingling in legs and feet    Patient Active Problem List   Diagnosis Date Noted   Diarrhea 02/22/2022   Abdominal pain 02/22/2022   Nausea & vomiting 02/20/2022   Hypokalemia 02/20/2022   Legal blindness 02/20/2022   Degenerative lumbar spinal stenosis 01/06/2018   Spondylolisthesis at L4-L5 level 11/14/2015   DOE (dyspnea on exertion) 10/14/2014   Generalized weakness 09/19/2014   Obese 05/20/2014   Nausea with vomiting 04/16/2009   RETINITIS PIGMENTOSA 12/04/2008   GLUCOSE INTOLERANCE 05/09/2007   Dyslipidemia 05/09/2007   ANEMIA-NOS 05/09/2007   ANXIETY 05/09/2007   Essential hypertension 05/09/2007   DIVERTICULOSIS, COLON 05/09/2007   BACK PAIN 05/09/2007   History of malignant neoplasm of large intestine 05/09/2007   ARTHRITIS, TRAUMATIC, UNSPECIFIED SITE 12/17/2006    Past  Surgical History:  Procedure Laterality Date   ABDOMINAL HYSTERECTOMY  1989   ANTERIOR LAT LUMBAR FUSION Right 01/06/2018   Procedure: Right Lumbar Two-Three Anterolateral lumbar interbody fusion with lateral plate;  Surgeon: Unice Pac, MD;  Location: [facility] OR;  Service: Neurosurgery;  Laterality: Right;  Right Lumbar Two-Three Anterolateral lumbar interbody fusion with lateral plate   APPENDECTOMY  1946   carapl tunnel release Left    cataract surgery Bilateral    COLONOSCOPY     MAXIMUM ACCESS (MAS)POSTERIOR LUMBAR INTERBODY FUSION (PLIF) 2 LEVEL N/A 11/14/2015   Procedure: Lumbar Four-Five Maximum access posterior lumbar interbody fusion, Lumbar Three-Four Lumbar Four-Five Posterolateral Fusion and Pedicle Screws;  Surgeon: Pac Unice, MD;  Location: [facility];  Service: Neurosurgery;  Laterality: N/A;  L3-4 L4-5 Maximum access posterior lumbar interbody fusion   OOPHORECTOMY  1989   s/p ganglion cyst  1973   s/p right hemicolectomy  12 yrs ago    OB History   No obstetric history on file.      Home Medications    Prior to Admission medications   Medication Sig Start Date End Date Taking? Authorizing Provider  ciprofloxacin  (CIPRO ) 500 MG tablet Take 1 tablet (500 mg total) by mouth 2 (two) times daily for 5 days. 05/11/23 05/16/23 Yes Ward, Tyteanna Ost Z, PA-C  meclizine  (ANTIVERT ) 25 MG tablet 1 tablet as needed Orally every 12 hrs  for 30 days As needed for dizziness 05/06/22  Yes [provider]  ondansetron  (ZOFRAN ) 4 MG tablet Take 1 tablet (4 mg total) by mouth every 8 (eight) hours as needed for nausea or vomiting. 05/11/23  Yes Ward, Harlene PEDLAR, PA-C  Alpha-Lipoic Acid 100 MG CAPS Take 100 mg by mouth daily.  Patient not taking: Reported on 02/26/2022    [provider]  AMBULATORY NON FORMULARY MEDICATION 2 capsules in the morning and at bedtime. Medication Name: Arthroflex Patient not taking: Reported on 02/26/2022    [provider]  AMBULATORY NON  FORMULARY MEDICATION 2 capsules in the morning and at bedtime. Medication Name: ART supplement Patient not taking: Reported on 02/26/2022    [provider]  AMBULATORY NON FORMULARY MEDICATION 2 capsules daily. Medication Name: HARLAN COMPLEX Patient not taking: Reported on 02/26/2022    [provider]  cephALEXin  (KEFLEX ) 500 MG capsule Take 1 capsule (500 mg total) by mouth 2 (two) times daily. Patient not taking: Reported on 07/12/2022 06/19/22   Lenor Hollering, MD  Cholecalciferol  (VITAMIN D3) 3000 units TABS Take 3,000 Units by mouth daily.  Patient not taking: Reported on 02/26/2022    [provider]  cyanocobalamin  1000 MCG tablet Take 1,000 mcg by mouth daily. Patient not taking: Reported on 02/26/2022    [provider]  famotidine  (PEPCID ) 20 MG tablet 1 tablet at bedtime Orally Once a day at bedtime for 90 days Patient not taking: Reported on 08/23/2022    [provider]  famotidine  (PEPCID ) 20 MG tablet Take 20 mg by mouth 2 (two) times daily. Patient not taking: Reported on 08/23/2022    [provider]  magnesium  30 MG tablet Take 30 mg by mouth daily. Patient not taking: Reported on 02/26/2022    [provider]  omeprazole  (PRILOSEC) 40 MG capsule TAKE 1 CAPSULE(40 MG) BY MOUTH DAILY 07/12/22   Abran Norleen SAILOR, MD  ondansetron  (ZOFRAN -ODT) 4 MG disintegrating tablet Take 1 tablet (4 mg total) by mouth every 8 (eight) hours as needed for nausea. Patient not taking: Reported on 08/23/2022 08/05/22   Jarold Olam HERO, PA-C  polyethylene glycol powder (GLYCOLAX /MIRALAX ) 17 GM/SCOOP powder Take 17 grams twice daily x 3 days, then decrease to 17 grams once daily thereafter Patient not taking: Reported on 07/12/2022 02/26/22   Kerman Vina HERO, NP  potassium chloride  SA (KLOR-CON  M) 20 MEQ tablet Take 2 tablets (40 mEq total) by mouth once for 1 dose. 07/31/22 07/31/22  Chet Mad, DO    Family History Family History   Problem Relation Age of Onset   Breast cancer Maternal Aunt    ALS Cousin    Polymyalgia rheumatica Cousin    Colon cancer Neg Hx     Social History Social History   Tobacco Use   Smoking status: Never   Smokeless tobacco: Never  Vaping Use   Vaping status: Never Used  Substance Use Topics   Alcohol  use: No    Alcohol /week: 0.0 standard drinks of alcohol    Drug use: No     Allergies   Codeine, Hydromet [hydrocodone  bit-homatrop mbr], Fish allergy, Cephalexin , Morphine, and Sulfa antibiotics   Review of Systems Review of Systems  Constitutional:  Negative for chills and fever.  HENT:  Negative for ear pain and sore throat.   Eyes:  Negative for pain and visual disturbance.  Respiratory:  Negative for cough and shortness of breath.   Cardiovascular:  Negative for chest pain and palpitations.  Gastrointestinal:  Negative for abdominal pain and vomiting.  Genitourinary:  Positive for frequency. Negative for dysuria and hematuria.  Musculoskeletal:  Negative for arthralgias and back pain.  Skin:  Negative for color change and rash.  Neurological:  Negative for seizures and syncope.  All other systems reviewed and are negative.    Physical Exam Triage Vital Signs ED Triage Vitals  Encounter Vitals Group     BP 05/11/23 1434 129/61     Systolic BP Percentile --      Diastolic BP Percentile --      Pulse Rate 05/11/23 1434 82     Resp 05/11/23 1434 18     Temp 05/11/23 1434 97.6 F (36.4 C)     Temp Source 05/11/23 1434 Oral     SpO2 05/11/23 1434 96 %     Weight --      Height 05/11/23 1439 5' 1 (1.549 m)     Head Circumference --      Peak Flow --      Pain Score 05/11/23 1438 0     Pain Loc --      Pain Education --      Exclude from Growth Chart --    No data found.  Updated Vital Signs BP 129/61 (BP Location: Right Arm)   Pulse 82   Temp 97.6 F (36.4 C) (Oral)   Resp 18   Ht 5' 1 (1.549 m)   SpO2 96%   BMI 30.23 kg/m   Visual  Acuity Right Eye Distance:   Left Eye Distance:   Bilateral Distance:    Right Eye Near:   Left Eye Near:    Bilateral Near:     Physical Exam Vitals and nursing note reviewed.  Constitutional:      General: She is not in acute distress.    Appearance: She is well-developed.  HENT:     Head: Normocephalic and atraumatic.  Eyes:     Conjunctiva/sclera: Conjunctivae normal.  Cardiovascular:     Rate and Rhythm: Normal rate and regular rhythm.     Heart sounds: No murmur heard. Pulmonary:     Effort: Pulmonary effort is normal. No respiratory distress.     Breath sounds: Normal breath sounds.  Abdominal:     Palpations: Abdomen is soft.     Tenderness: There is no abdominal tenderness.  Musculoskeletal:        General: No swelling.     Cervical back: Neck supple.  Skin:    General: Skin is warm and dry.     Capillary Refill: Capillary refill takes less than 2 seconds.  Neurological:     Mental Status: She is alert.  Psychiatric:        Mood and Affect: Mood normal.      UC Treatments / Results  Labs (all labs ordered are listed, but only abnormal results are displayed) Labs Reviewed  POCT URINALYSIS DIP (MANUAL ENTRY) - Abnormal; Notable for the following components:      Result Value   Bilirubin, UA small (*)    Spec Grav, UA >=1.030 (*)    Blood, UA small (*)    Protein Ur, POC =100 (*)    Leukocytes, UA Trace (*)    All other components within normal limits  URINE CULTURE    EKG   Radiology No results found.  Procedures Procedures (including critical care time)  Medications Ordered in UC Medications - No data to display  Initial Impression / Assessment and Plan /  UC Course  I have reviewed the triage vital signs and the nursing notes.  Pertinent labs & imaging results that were available during my care of the patient were reviewed by me and considered in my medical decision making (see chart for details).     Will treat for UTI.  Urine  culture sent out and will change treatment plan if indicated based on results.  Patient overall well-appearing in no acute distress.  Vitals normal limits.  Daughter is present and agrees with treatment plan.  Daughter request Cipro  as this has worked well in the past.  Patient reports she will need something for nausea if she is started on antibiotic. Final Clinical Impressions(s) / UC Diagnoses   Final diagnoses:  Acute cystitis without hematuria     Discharge Instructions      Take antibiotic as prescribed Drink plenty of fluids Will call with urine culture results and change treatment plan if needed    ED Prescriptions     Medication Sig Dispense Auth. Provider   ciprofloxacin  (CIPRO ) 500 MG tablet Take 1 tablet (500 mg total) by mouth 2 (two) times daily for 5 days. 10 tablet Ward, Jalan Bodi Z, PA-C   ondansetron  (ZOFRAN ) 4 MG tablet Take 1 tablet (4 mg total) by mouth every 8 (eight) hours as needed for nausea or vomiting. 20 tablet Ward, Beckhem Isadore Z, PA-C      PDMP not reviewed this encounter.   Ward, Harlene PEDLAR, PA-C 05/11/23 [phone]

## 2023-05-13 ENCOUNTER — Telehealth (HOSPITAL_COMMUNITY): Payer: Self-pay | Admitting: Emergency Medicine

## 2023-05-13 LAB — URINE CULTURE: Culture: >=100000 — AB

## 2023-05-13 MED ORDER — NITROFURANTOIN MONOHYD MACRO 100 MG PO CAPS: 100.0000 mg | ORAL_CAPSULE | Freq: Two times a day (BID) | ORAL | 0 refills | Status: DC

## 2023-05-13 NOTE — Telephone Encounter (Signed)
 Macrobid, per Lillia Abed, APP, due to urine culture results

## 2023-05-24 DIAGNOSIS — H548 Legal blindness, as defined in USA: Secondary | ICD-10-CM | POA: Diagnosis not present

## 2023-05-24 DIAGNOSIS — K5909 Other constipation: Secondary | ICD-10-CM | POA: Diagnosis not present

## 2023-05-24 DIAGNOSIS — Z79899 Other long term (current) drug therapy: Secondary | ICD-10-CM | POA: Diagnosis not present

## 2023-05-24 DIAGNOSIS — N3000 Acute cystitis without hematuria: Secondary | ICD-10-CM | POA: Diagnosis not present

## 2023-05-24 DIAGNOSIS — R112 Nausea with vomiting, unspecified: Secondary | ICD-10-CM | POA: Diagnosis not present

## 2023-05-24 DIAGNOSIS — K219 Gastro-esophageal reflux disease without esophagitis: Secondary | ICD-10-CM | POA: Diagnosis not present

## 2023-05-27 DIAGNOSIS — N390 Urinary tract infection, site not specified: Secondary | ICD-10-CM | POA: Diagnosis not present

## 2023-06-03 DIAGNOSIS — I10 Essential (primary) hypertension: Secondary | ICD-10-CM | POA: Diagnosis not present

## 2023-06-03 DIAGNOSIS — M25512 Pain in left shoulder: Secondary | ICD-10-CM | POA: Diagnosis not present

## 2023-06-03 DIAGNOSIS — M25511 Pain in right shoulder: Secondary | ICD-10-CM | POA: Diagnosis not present

## 2023-06-03 DIAGNOSIS — M4326 Fusion of spine, lumbar region: Secondary | ICD-10-CM | POA: Diagnosis not present

## 2023-06-03 DIAGNOSIS — M4316 Spondylolisthesis, lumbar region: Secondary | ICD-10-CM | POA: Diagnosis not present

## 2023-06-03 DIAGNOSIS — G8929 Other chronic pain: Secondary | ICD-10-CM | POA: Diagnosis not present

## 2023-06-03 DIAGNOSIS — M48061 Spinal stenosis, lumbar region without neurogenic claudication: Secondary | ICD-10-CM | POA: Diagnosis not present

## 2023-06-20 ENCOUNTER — Observation Stay (HOSPITAL_COMMUNITY)
Admission: EM | Admit: 2023-06-20 | Discharge: 2023-06-24 | Disposition: A | Discharge status: Home / Self Care | Payer: Medicare HMO | Attending: Family Medicine | Admitting: Family Medicine

## 2023-06-20 ENCOUNTER — Encounter (HOSPITAL_COMMUNITY): Payer: Self-pay

## 2023-06-20 ENCOUNTER — Emergency Department (HOSPITAL_COMMUNITY): Payer: Medicare HMO

## 2023-06-20 ENCOUNTER — Other Ambulatory Visit: Payer: Self-pay

## 2023-06-20 DIAGNOSIS — I10 Essential (primary) hypertension: Secondary | ICD-10-CM | POA: Diagnosis not present

## 2023-06-20 DIAGNOSIS — R1010 Upper abdominal pain, unspecified: Secondary | ICD-10-CM | POA: Diagnosis not present

## 2023-06-20 DIAGNOSIS — R112 Nausea with vomiting, unspecified: Secondary | ICD-10-CM | POA: Diagnosis present

## 2023-06-20 DIAGNOSIS — R1111 Vomiting without nausea: Secondary | ICD-10-CM | POA: Diagnosis not present

## 2023-06-20 DIAGNOSIS — R109 Unspecified abdominal pain: Secondary | ICD-10-CM | POA: Diagnosis not present

## 2023-06-20 DIAGNOSIS — H548 Legal blindness, as defined in USA: Secondary | ICD-10-CM | POA: Insufficient documentation

## 2023-06-20 DIAGNOSIS — Z9049 Acquired absence of other specified parts of digestive tract: Secondary | ICD-10-CM | POA: Insufficient documentation

## 2023-06-20 DIAGNOSIS — Z0000 Encounter for general adult medical examination without abnormal findings: Secondary | ICD-10-CM

## 2023-06-20 DIAGNOSIS — Z85038 Personal history of other malignant neoplasm of large intestine: Secondary | ICD-10-CM | POA: Insufficient documentation

## 2023-06-20 DIAGNOSIS — R410 Disorientation, unspecified: Secondary | ICD-10-CM | POA: Insufficient documentation

## 2023-06-20 DIAGNOSIS — R2689 Other abnormalities of gait and mobility: Secondary | ICD-10-CM | POA: Diagnosis not present

## 2023-06-20 DIAGNOSIS — N390 Urinary tract infection, site not specified: Secondary | ICD-10-CM | POA: Diagnosis present

## 2023-06-20 DIAGNOSIS — K529 Noninfective gastroenteritis and colitis, unspecified: Principal | ICD-10-CM

## 2023-06-20 DIAGNOSIS — K760 Fatty (change of) liver, not elsewhere classified: Secondary | ICD-10-CM | POA: Diagnosis not present

## 2023-06-20 DIAGNOSIS — E876 Hypokalemia: Secondary | ICD-10-CM | POA: Insufficient documentation

## 2023-06-20 DIAGNOSIS — Z9889 Other specified postprocedural states: Secondary | ICD-10-CM | POA: Diagnosis not present

## 2023-06-20 DIAGNOSIS — Z79899 Other long term (current) drug therapy: Secondary | ICD-10-CM | POA: Insufficient documentation

## 2023-06-20 DIAGNOSIS — K5730 Diverticulosis of large intestine without perforation or abscess without bleeding: Secondary | ICD-10-CM | POA: Diagnosis not present

## 2023-06-20 LAB — CBC
HCT: 38.3 % (ref 36.0–46.0)
Hemoglobin: 12.6 g/dL (ref 12.0–15.0)
MCH: 31.5 pg (ref 26.0–34.0)
MCHC: 32.9 g/dL (ref 30.0–36.0)
MCV: 95.8 fL (ref 80.0–100.0)
Platelets: 158 10*3/uL (ref 150–400)
RBC: 4 MIL/uL (ref 3.87–5.11)
RDW: 13.2 % (ref 11.5–15.5)
WBC: 5.1 10*3/uL (ref 4.0–10.5)
nRBC: 0 % (ref 0.0–0.2)

## 2023-06-20 LAB — URINALYSIS, ROUTINE W REFLEX MICROSCOPIC
Bilirubin Urine: NEGATIVE
Glucose, UA: NEGATIVE mg/dL
Ketones, ur: 20 mg/dL — AB
Nitrite: NEGATIVE
Protein, ur: NEGATIVE mg/dL
Specific Gravity, Urine: >1.046 — ABNORMAL HIGH (ref 1.005–1.030)
pH: 5 (ref 5.0–8.0)

## 2023-06-20 LAB — LIPASE, BLOOD: Lipase: 29 U/L (ref 11–51)

## 2023-06-20 LAB — COMPREHENSIVE METABOLIC PANEL
ALT: 22 U/L (ref 0–44)
AST: 18 U/L (ref 15–41)
Albumin: 4.3 g/dL (ref 3.5–5.0)
Alkaline Phosphatase: 38 U/L (ref 38–126)
Anion gap: 17 — ABNORMAL HIGH (ref 5–15)
BUN: 21 mg/dL (ref 8–23)
CO2: 25 mmol/L (ref 22–32)
Calcium: 9.6 mg/dL (ref 8.9–10.3)
Chloride: 97 mmol/L — ABNORMAL LOW (ref 98–111)
Creatinine, Ser: 0.66 mg/dL (ref 0.44–1.00)
GFR, Estimated: >60 mL/min (ref >60)
Glucose, Bld: 101 mg/dL — ABNORMAL HIGH (ref 70–99)
Potassium: 3.1 mmol/L — ABNORMAL LOW (ref 3.5–5.1)
Sodium: 139 mmol/L (ref 135–145)
Total Bilirubin: 1.5 mg/dL — ABNORMAL HIGH (ref 0.0–1.2)
Total Protein: 7.4 g/dL (ref 6.5–8.1)

## 2023-06-20 MED ORDER — SODIUM CHLORIDE 0.9 % IV SOLN
2.0000 g | INTRAVENOUS | Status: DC
  Administered 2023-06-21: 2 g via INTRAVENOUS
  Filled 2023-06-20: qty 20

## 2023-06-20 MED ORDER — IOHEXOL 300 MG/ML  SOLN
100.0000 mL | Freq: Once | INTRAMUSCULAR | Status: AC | PRN
  Administered 2023-06-20: 100 mL via INTRAVENOUS

## 2023-06-20 MED ORDER — SENNOSIDES-DOCUSATE SODIUM 8.6-50 MG PO TABS
2.0000 | ORAL_TABLET | Freq: Two times a day (BID) | ORAL | Status: DC
  Administered 2023-06-21 – 2023-06-23 (×4): 2 via ORAL
  Filled 2023-06-20 (×6): qty 2

## 2023-06-20 MED ORDER — SODIUM CHLORIDE 0.9 % IV BOLUS
1000.0000 mL | Freq: Once | INTRAVENOUS | Status: AC
  Administered 2023-06-20: 1000 mL via INTRAVENOUS

## 2023-06-20 MED ORDER — POTASSIUM CHLORIDE 20 MEQ PO PACK
60.0000 meq | PACK | ORAL | Status: DC
  Administered 2023-06-21: 60 meq via ORAL
  Filled 2023-06-20: qty 3

## 2023-06-20 MED ORDER — HYDROMORPHONE HCL 1 MG/ML IJ SOLN
0.5000 mg | Freq: Once | INTRAMUSCULAR | Status: DC
  Filled 2023-06-20: qty 1

## 2023-06-20 MED ORDER — ENOXAPARIN SODIUM 40 MG/0.4ML IJ SOSY
40.0000 mg | PREFILLED_SYRINGE | INTRAMUSCULAR | Status: DC
  Administered 2023-06-21 – 2023-06-22 (×2): 40 mg via SUBCUTANEOUS
  Filled 2023-06-20 (×2): qty 0.4

## 2023-06-20 MED ORDER — ONDANSETRON HCL 4 MG/2ML IJ SOLN
4.0000 mg | Freq: Once | INTRAMUSCULAR | Status: AC
  Administered 2023-06-20: 4 mg via INTRAVENOUS
  Filled 2023-06-20: qty 2

## 2023-06-20 MED ORDER — ACETAMINOPHEN 650 MG RE SUPP: 650.0000 mg | Freq: Four times a day (QID) | RECTAL | Status: DC | PRN

## 2023-06-20 MED ORDER — POLYETHYLENE GLYCOL 3350 17 G PO PACK
17.0000 g | PACK | Freq: Every day | ORAL | Status: DC | PRN
  Administered 2023-06-21: 17 g via ORAL
  Filled 2023-06-20: qty 1

## 2023-06-20 MED ORDER — SODIUM CHLORIDE 0.9% FLUSH
3.0000 mL | Freq: Two times a day (BID) | INTRAVENOUS | Status: DC
  Administered 2023-06-21 – 2023-06-24 (×8): 3 mL via INTRAVENOUS

## 2023-06-20 MED ORDER — PANTOPRAZOLE SODIUM 40 MG IV SOLR
40.0000 mg | Freq: Every day | INTRAVENOUS | Status: DC
  Administered 2023-06-21 – 2023-06-23 (×4): 40 mg via INTRAVENOUS
  Filled 2023-06-20 (×4): qty 10

## 2023-06-20 MED ORDER — ACETAMINOPHEN 325 MG PO TABS: 650.0000 mg | ORAL_TABLET | Freq: Four times a day (QID) | ORAL | Status: DC | PRN

## 2023-06-20 MED ORDER — PANTOPRAZOLE SODIUM 40 MG IV SOLR
40.0000 mg | Freq: Once | INTRAVENOUS | Status: AC
  Administered 2023-06-20: 40 mg via INTRAVENOUS
  Filled 2023-06-20: qty 10

## 2023-06-20 MED ORDER — SODIUM CHLORIDE 0.9 % IV SOLN
2.0000 g | Freq: Once | INTRAVENOUS | Status: AC
  Administered 2023-06-20: 2 g via INTRAVENOUS
  Filled 2023-06-20: qty 20

## 2023-06-20 NOTE — Assessment & Plan Note (Signed)
 This was diagnosed by ER attending based on increased RBCs in urine.  I will check a CK to make sure were not missing rhabdomyolysis.  Treatment started with ceftriaxone .  Culture is pending.  Continue with same

## 2023-06-20 NOTE — ED Triage Notes (Signed)
 Pt has been vomiting intermittently for a month, worse the last day. Pt has had no PO intake in 24 hours. CBG 99 with EMS. No vomiting with EMS.

## 2023-06-20 NOTE — Assessment & Plan Note (Signed)
 Patient is essentially brought into the ER after wellness check because patient did not answer phone call to her home.  Patient is found to have mild electrolyte abnormalities such as hypokalemia, I will check a magnesium  and replace hypokalemia.  Patient also has not received her food supply at home for at least 24 hours, to me she tells she has not eaten in 48 hours.  Therefore I think this will need social work evaluation.

## 2023-06-20 NOTE — Assessment & Plan Note (Addendum)
 Patient describes multiple episodes of vomiting that started approximately 10 days ago.  However remitted at least 2 days ago.  At this time abdomen soft nontender.  Patient is hungry.  Will start diet and trend. Check troponin

## 2023-06-20 NOTE — ED Provider Notes (Signed)
 [facility] AT [facility] Provider Note   CSN: [id number] Arrival date & time: 06/20/23  1351     History  Chief Complaint  Patient presents with   Emesis    Kathryn Romero is a 88 y.o. female.  Patient complains of nausea vomiting for over a week.  Patient with some abdominal discomfort.  Patient is blind and has difficulty hearing  The history is provided by the patient and medical records. No language interpreter was used.  Emesis Severity:  Moderate Timing:  Constant Quality:  Bilious material Able to tolerate:  Liquids Progression:  Worsening Chronicity:  New Recent urination:  Normal Relieved by:  Nothing Worsened by:  Nothing Associated symptoms: no abdominal pain, no cough, no diarrhea and no headaches        Home Medications Prior to Admission medications   Medication Sig Start Date End Date Taking? Authorizing Provider  Alpha-Lipoic Acid 100 MG CAPS Take 100 mg by mouth daily.  Patient not taking: Reported on 02/26/2022    [provider]  AMBULATORY NON FORMULARY MEDICATION 2 capsules in the morning and at bedtime. Medication Name: Arthroflex Patient not taking: Reported on 02/26/2022    [provider]  AMBULATORY NON FORMULARY MEDICATION 2 capsules in the morning and at bedtime. Medication Name: ART supplement Patient not taking: Reported on 02/26/2022    [provider]  AMBULATORY NON FORMULARY MEDICATION 2 capsules daily. Medication Name: Lyman Bishop COMPLEX Patient not taking: Reported on 02/26/2022    [provider]  cephALEXin (KEFLEX) 500 MG capsule Take 1 capsule (500 mg total) by mouth 2 (two) times daily. Patient not taking: Reported on 07/12/2022 06/19/22   Rolan Bucco, MD  Cholecalciferol (VITAMIN D3) 3000 units TABS Take 3,000 Units by mouth daily.  Patient not taking: Reported on 02/26/2022    [provider]  cyanocobalamin 1000 MCG tablet Take 1,000 mcg by mouth  daily. Patient not taking: Reported on 02/26/2022    [provider]  famotidine (PEPCID) 20 MG tablet 1 tablet at bedtime Orally Once a day at bedtime for 90 days Patient not taking: Reported on 08/23/2022    [provider]  famotidine (PEPCID) 20 MG tablet Take 20 mg by mouth 2 (two) times daily. Patient not taking: Reported on 08/23/2022    [provider]  magnesium 30 MG tablet Take 30 mg by mouth daily. Patient not taking: Reported on 02/26/2022    [provider]  meclizine (ANTIVERT) 25 MG tablet 1 tablet as needed Orally every 12 hrs for 30 days As needed for dizziness 05/06/22   [provider]  nitrofurantoin, macrocrystal-monohydrate, (MACROBID) 100 MG capsule Take 1 capsule (100 mg total) by mouth 2 (two) times daily. 05/13/23   Merrilee Jansky, MD  omeprazole (PRILOSEC) 40 MG capsule TAKE 1 CAPSULE(40 MG) BY MOUTH DAILY 07/12/22   Hilarie Fredrickson, MD  ondansetron (ZOFRAN) 4 MG tablet Take 1 tablet (4 mg total) by mouth every 8 (eight) hours as needed for nausea or vomiting. 05/11/23   Ward, Tylene Fantasia, PA-C  ondansetron (ZOFRAN-ODT) 4 MG disintegrating tablet Take 1 tablet (4 mg total) by mouth every 8 (eight) hours as needed for nausea. Patient not taking: Reported on 08/23/2022 08/05/22   Garlon Hatchet, PA-C  polyethylene glycol powder [facility]) 17 GM/SCOOP powder Take 17 grams twice daily x 3 days, then decrease to 17 grams once daily thereafter Patient not taking: Reported on 07/12/2022 02/26/22   Meredith Pel,  NP  potassium chloride SA (KLOR-CON M) 20 MEQ tablet Take 2 tablets (40 mEq total) by mouth once for 1 dose. 07/31/22 07/31/22  Sabino Dick, DO      Allergies    Codeine, Hydromet [hydrocodone bit-homatrop mbr], Fish allergy, Cephalexin, Morphine, and Sulfa antibiotics    Review of Systems   Review of Systems  Constitutional:  Negative for appetite change and fatigue.  HENT:  Negative for congestion, ear  discharge and sinus pressure.   Eyes:  Negative for discharge.  Respiratory:  Negative for cough.   Cardiovascular:  Negative for chest pain.  Gastrointestinal:  Positive for vomiting. Negative for abdominal pain and diarrhea.  Genitourinary:  Negative for frequency and hematuria.  Musculoskeletal:  Negative for back pain.  Skin:  Negative for rash.  Neurological:  Negative for seizures and headaches.  Psychiatric/Behavioral:  Negative for hallucinations.     Physical Exam Updated Vital Signs BP (!) 171/70 (BP Location: Right Arm)   Pulse 84   Temp 98.1 F (36.7 C) (Oral)   Resp 18   Ht 5\' 1"  (1.549 m)   Wt 72.6 kg   SpO2 94%   BMI 30.24 kg/m  Physical Exam Vitals and nursing note reviewed.  Constitutional:      Appearance: She is well-developed.  HENT:     Head: Normocephalic.     Nose: Nose normal.  Eyes:     General: No scleral icterus.    Conjunctiva/sclera: Conjunctivae normal.  Neck:     Thyroid: No thyromegaly.  Cardiovascular:     Rate and Rhythm: Normal rate and regular rhythm.     Heart sounds: No murmur heard.    No friction rub. No gallop.  Pulmonary:     Breath sounds: No stridor. No wheezing or rales.  Chest:     Chest wall: No tenderness.  Abdominal:     General: There is no distension.     Tenderness: There is abdominal tenderness. There is no rebound.  Musculoskeletal:        General: Normal range of motion.     Cervical back: Neck supple.  Lymphadenopathy:     Cervical: No cervical adenopathy.  Skin:    Findings: No erythema or rash.  Neurological:     Mental Status: She is alert and oriented to person, place, and time.     Motor: No abnormal muscle tone.     Coordination: Coordination normal.  Psychiatric:        Behavior: Behavior normal.     ED Results / Procedures / Treatments   Labs (all labs ordered are listed, but only abnormal results are displayed) Labs Reviewed  COMPREHENSIVE METABOLIC PANEL - Abnormal; Notable for the  following components:      Result Value   Potassium 3.1 (*)    Chloride 97 (*)    Glucose, Bld 101 (*)    Total Bilirubin 1.5 (*)    Anion gap 17 (*)    All other components within normal limits  URINALYSIS, ROUTINE W REFLEX MICROSCOPIC - Abnormal; Notable for the following components:   Specific Gravity, Urine >1.046 (*)    Hgb urine dipstick MODERATE (*)    Ketones, ur 20 (*)    Leukocytes,Ua TRACE (*)    Bacteria, UA RARE (*)    All other components within normal limits  URINE CULTURE  LIPASE, BLOOD  CBC    EKG None  Radiology CT ABDOMEN PELVIS W CONTRAST Result Date: 06/20/2023 CLINICAL DATA:  Vomiting intermittently for  a month. No p.o. intake in 24 hours. Abdominal pain. EXAM: CT ABDOMEN AND PELVIS WITH CONTRAST TECHNIQUE: Multidetector CT imaging of the abdomen and pelvis was performed using the standard protocol following bolus administration of intravenous contrast. RADIATION DOSE REDUCTION: This exam was performed according to the departmental dose-optimization program which includes automated exposure control, adjustment of the mA and/or kV according to patient size and/or use of iterative reconstruction technique. CONTRAST:  OMNIPAQUE IOHEXOL 300 MG/ML  SOLN COMPARISON:  Abdominal ultrasound 08/04/2022 and CT 08/04/2022 FINDINGS: Lower chest: No acute abnormality. Hepatobiliary: Hepatic steatosis. Normal gallbladder. No biliary dilation. Pancreas: Unremarkable. Spleen: Unremarkable. Adrenals/Urinary Tract: Normal adrenal glands. No urinary calculi or hydronephrosis. Bladder is unremarkable. Stomach/Bowel: Normal caliber large and small bowel. Postoperative change of right colectomy with ileocolonic anastomosis in the right upper quadrant. Colonic diverticulosis without diverticulitis. No bowel wall thickening. The appendix is normal.Stomach is within normal limits. Vascular/Lymphatic: Aortic atherosclerosis. No enlarged abdominal or pelvic lymph nodes. Reproductive:  Unremarkable. Other: No free intraperitoneal fluid or air. Musculoskeletal: No acute fracture. Postoperative change about the lumbar spine from L2-L5. Unchanged fat containing right lumbar hernia. IMPRESSION: 1. No acute abnormality in the abdomen or pelvis. 2. Hepatic steatosis. 3.  Aortic Atherosclerosis (ICD10-I70.0). Electronically Signed   By: Minerva Fester M.D.   On: 06/20/2023 20:16    Procedures Procedures    Medications Ordered in ED Medications  HYDROmorphone (DILAUDID) injection 0.5 mg (0 mg Intravenous Hold 06/20/23 2008)  cefTRIAXone (ROCEPHIN) 2 g in sodium chloride 0.9 % 100 mL IVPB (has no administration in time range)  sodium chloride 0.9 % bolus 1,000 mL (0 mLs Intravenous Stopped 06/20/23 2105)  pantoprazole (PROTONIX) injection 40 mg (40 mg Intravenous Given 06/20/23 1728)  ondansetron (ZOFRAN) injection 4 mg (4 mg Intravenous Given 06/20/23 1728)  iohexol (OMNIPAQUE) 300 MG/ML solution 100 mL (100 mLs Intravenous Contrast Given 06/20/23 1851)    ED Course/ Medical Decision Making/ A&P                                 Medical Decision Making Amount and/or Complexity of Data Reviewed Labs: ordered. Radiology: ordered.  Risk Prescription drug management. Decision regarding hospitalization.  This patient presents to the ED for concern of vomiting, this involves an extensive number of treatment options, and is a complaint that carries with it a high risk of complications and morbidity.  The differential diagnosis includes gastritis, appendicitis   Co morbidities that complicate the patient evaluation  Blind and partially deaf   Additional history obtained:  Additional history obtained from patient and friend External records from outside source obtained and reviewed including hospital records   Lab Tests:  I Ordered, and personally interpreted labs.  The pertinent results include: Urinalysis shows specific gravity 1046 and 11-20 red cells   Imaging Studies  ordered:  I ordered imaging studies including CT abdomen I independently visualized and interpreted imaging which showed unremarkable I agree with the radiologist interpretation   Cardiac Monitoring: / EKG:  The patient was maintained on a cardiac monitor.  I personally viewed and interpreted the cardiac monitored which showed an underlying rhythm of: Normal sinus rhythm   Consultations Obtained:  I requested consultation with the hospitalist,  and discussed lab and imaging findings as well as pertinent plan - they recommend: Admit   Problem List / ED Course / Critical interventions / Medication management  Vomiting and Blind I ordered medication including normal  saline for dehydration with Zofran Reevaluation of the patient after these medicines showed that the patient improved I have reviewed the patients home medicines and have made adjustments as needed   Social Determinants of Health:  Lives by herself even though she is blind and has hearing problems   Test / Admission - Considered:  None  Patient with abdominal pain and vomiting for over a week,along with hematuria.  She will be admitted to medicine        Final Clinical Impression(s) / ED Diagnoses Final diagnoses:  Pain of upper abdomen    Rx / DC Orders ED Discharge Orders     None         Bethann Berkshire, MD 06/22/23 1334

## 2023-06-20 NOTE — H&P (Addendum)
 History and Physical    Patient: Kathryn Romero ONG:[id number] DOB: 09/24/1935 DOA: 06/20/2023 DOS: the patient was seen and examined on 06/20/2023 PCP: Lonzie Robins, MD  Patient coming from: Home  Chief Complaint:  Chief Complaint  Patient presents with   Emesis   HPI: Kathryn Romero is a 88 y.o. female with medical history significant of blind, deaf.  Patient also reports chronic gait abnormality and has trouble getting around.  In spite of this patient actually lives by herself with intermittent help from somebody who supplies food and other sort of health.  Patient reports that about 7 or 10 days ago she started having a new nausea and vomiting.  She describes vomiting "continuously ".  As soon as she would move she would vomit.  However she denies having any fever diarrhea marked abdominal pain or any dysuria or flank pain.  Patient reports that she actually stopped throwing up about a couple of days ago.  But she states that she has not had her supply of "soup "come for the last 2 days as she has not eaten and drink since then.  Patient reports some chronic lower abdominal pain/constipation.  No acute changes  Per report obtained from the ER provider, patient was not heard from when somebody made a phone call to check on her, therefore EMS was called.  EMS found the patient apparently in the bed.  Per review of EMS run sheet, patient was found fully coherent at her home.  And no vital abnormality or distress.  There is no mention of there being any life-threatening circumstances.  Or patient having urinated or defecated on herself.  Patient did mention to the EMS provider that she had not had food and at at least a 24-hour.  Patient is brought to [facility] long ER.  Vitally she has been okay here.  Given the report of vomiting, CAT scan abdomen pelvis was done.  Which is not actionable.  Urinalysis shows elevated RBCs.  Patient has started on ceftriaxone  for UTI.  Medical evaluation is  sought.  Addendum: I also spoke with niece natalie Gagnon - she advised that patient has very poor sense of timing, some baseline memory issues. Above was corroborated by niece. Initially it seems a family/friend went to check on the patient. The person who checked on the patient said that patient was asking about her husband (long demised) when family checked on her today - therefore concern for confusion and EMS was called. No other example of confusion provided.  Review of Systems: As mentioned in the history of present illness. All other systems reviewed and are negative. Past Medical History:  Diagnosis Date   ANEMIA-NOS    ANXIETY    Aortic atherosclerosis (HCC)    Arthritis    Cancer (HCC) 2005   colon cancer   Chronic back pain    spondylolisthesis   Diverticulosis    Fatty liver    GERD (gastroesophageal reflux disease)    History of blood transfusion    no abnormal reaction noted   History of colon cancer 2004   History of shingles    HYPERLIPIDEMIA    takes Pravastatin  daily   Hypertension    Legally blind    Nausea & vomiting 02/20/2022   Nocturia    RETINITIS PIGMENTOSA    SYNDROME, CARPAL TUNNEL    Weakness    numbness and tingling in legs and feet   Past Surgical History:  Procedure Laterality Date   ABDOMINAL HYSTERECTOMY  1989  ANTERIOR LAT LUMBAR FUSION Right 01/06/2018   Procedure: Right Lumbar Two-Three Anterolateral lumbar interbody fusion with lateral plate;  Surgeon: Manya Sells, MD;  [facility];  Service: Neurosurgery;  Laterality: Right;  Right Lumbar Two-Three Anterolateral lumbar interbody fusion with lateral plate   APPENDECTOMY  1946   carapl tunnel release Left    cataract surgery Bilateral    COLONOSCOPY     MAXIMUM ACCESS (MAS)POSTERIOR LUMBAR INTERBODY FUSION (PLIF) 2 LEVEL N/A 11/14/2015   Procedure: Lumbar Four-Five Maximum access posterior lumbar interbody fusion, Lumbar Three-Four Lumbar Four-Five Posterolateral Fusion and Pedicle  Screws;  Surgeon: Manya Sells, MD;  Location: [facility] OR[facility];  Service: Neurosurgery;  Laterality: N/A;  L3-4 L4-5 Maximum access posterior lumbar interbody fusion   OOPHORECTOMY  1989   s/p ganglion cyst  1973   s/p right hemicolectomy  12 yrs ago   Social History:  reports that she has never smoked. She has never used smokeless tobacco. She reports that she does not drink alcohol  and does not use drugs.  Allergies  Allergen Reactions   Codeine Shortness Of Breath   Hydromet [Hydrocodone  Bit-Homatrop Mbr] Shortness Of Breath and Other (See Comments)    Wheezing   Fish Allergy Other (See Comments)    Burning Sensation and Headache   Cephalexin  Nausea Only   Morphine Other (See Comments)    "feels funny"   Sulfa Antibiotics Nausea And Vomiting         Family History  Problem Relation Age of Onset   Breast cancer Maternal Aunt    ALS Cousin    Polymyalgia rheumatica Cousin    Colon cancer Neg Hx     Prior to Admission medications   Medication Sig Start Date End Date Taking? Authorizing Provider  pantoprazole  (PROTONIX ) 40 MG tablet Take 40 mg by mouth daily. 05/24/23  Yes [provider]  Alpha-Lipoic Acid 100 MG CAPS Take 100 mg by mouth daily.  Patient not taking: Reported on 02/26/2022    [provider]  AMBULATORY NON FORMULARY MEDICATION 2 capsules in the morning and at bedtime. Medication Name: Arthroflex Patient not taking: Reported on 02/26/2022    [provider]  AMBULATORY NON FORMULARY MEDICATION 2 capsules in the morning and at bedtime. Medication Name: ART supplement Patient not taking: Reported on 02/26/2022    [provider]  AMBULATORY NON FORMULARY MEDICATION 2 capsules daily. Medication Name: Caroline Cinnamon COMPLEX Patient not taking: Reported on 02/26/2022    [provider]  cephALEXin  (KEFLEX ) 500 MG capsule Take 1 capsule (500 mg total) by mouth 2 (two) times daily. Patient not taking: Reported on 07/12/2022 06/19/22    Hershel Los, MD  Cholecalciferol  (VITAMIN D3) 3000 units TABS Take 3,000 Units by mouth daily.  Patient not taking: Reported on 02/26/2022    [provider]  cyanocobalamin  1000 MCG tablet Take 1,000 mcg by mouth daily. Patient not taking: Reported on 02/26/2022    [provider]  famotidine  (PEPCID ) 20 MG tablet 1 tablet at bedtime Orally Once a day at bedtime for 90 days Patient not taking: Reported on 08/23/2022    [provider]  famotidine  (PEPCID ) 20 MG tablet Take 20 mg by mouth 2 (two) times daily. Patient not taking: Reported on 08/23/2022    [provider]  magnesium  30 MG tablet Take 30 mg by mouth daily. Patient not taking: Reported on 02/26/2022    [provider]  meclizine  (ANTIVERT ) 25 MG tablet 1 tablet as needed Orally every 12  hrs for 30 days As needed for dizziness 05/06/22   [provider]  nitrofurantoin , macrocrystal-monohydrate, (MACROBID ) 100 MG capsule Take 1 capsule (100 mg total) by mouth 2 (two) times daily. 05/13/23   Corine Dice, MD  omeprazole  (PRILOSEC) 40 MG capsule TAKE 1 CAPSULE(40 MG) BY MOUTH DAILY 07/12/22   Tobin Forts, MD  ondansetron  (ZOFRAN ) 4 MG tablet Take 1 tablet (4 mg total) by mouth every 8 (eight) hours as needed for nausea or vomiting. 05/11/23   Ward, Char Common, PA-C  ondansetron  (ZOFRAN -ODT) 4 MG disintegrating tablet Take 1 tablet (4 mg total) by mouth every 8 (eight) hours as needed for nausea. Patient not taking: Reported on 08/23/2022 08/05/22   Coretha Dew, PA-C  polyethylene glycol powder (GLYCOLAX /MIRALAX ) 17 GM/SCOOP powder Take 17 grams twice daily x 3 days, then decrease to 17 grams once daily thereafter Patient not taking: Reported on 07/12/2022 02/26/22   Arlee Bellows, NP  potassium chloride  SA (KLOR-CON  M) 20 MEQ tablet Take 2 tablets (40 mEq total) by mouth once for 1 dose. 07/31/22 07/31/22  Espinoza, Alejandra, DO  rosuvastatin  (CRESTOR ) 5 MG tablet Take 5 mg by  mouth daily.    [provider]    Physical Exam: Vitals:   06/20/23 1800 06/20/23 1845 06/20/23 2030 06/20/23 2200  BP: 129/66  (!) 171/70 (!) 113/98  Pulse: 78  84 73  Resp: 16  18 18   Temp:  98.1 F (36.7 C)  98.3 F (36.8 C)  TempSrc:  Oral    SpO2: 90%  94% 94%  Weight:      Height:       General: Patient is alert and awake, gives a fully coherent account of her symptoms, corroborated with documentation from the EMS report Respiratory exam: Bilateral intravesicular Cardiovascular exam S1-S2 normal Abdomen obese all quadrant soft nontender Extremities warm without edema no focal motor deficit Patient is blind and hard of hearing. Data Reviewed:  Labs on Admission:  Results for orders placed or performed during the hospital encounter of 06/20/23 (from the past 24 hours)  Lipase, blood     Status: None   Collection Time: 06/20/23  2:01 PM  Result Value Ref Range   Lipase 29 11 - 51 U/L  Comprehensive metabolic panel     Status: Abnormal   Collection Time: 06/20/23  2:01 PM  Result Value Ref Range   Sodium 139 135 - 145 mmol/L   Potassium 3.1 (L) 3.5 - 5.1 mmol/L   Chloride 97 (L) 98 - 111 mmol/L   CO2 25 22 - 32 mmol/L   Glucose, Bld 101 (H) 70 - 99 mg/dL   BUN 21 8 - 23 mg/dL   Creatinine, Ser 1.61 0.44 - 1.00 mg/dL   Calcium  9.6 8.9 - 10.3 mg/dL   Total Protein 7.4 6.5 - 8.1 g/dL   Albumin 4.3 3.5 - 5.0 g/dL   AST 18 15 - 41 U/L   ALT 22 0 - 44 U/L   Alkaline Phosphatase 38 38 - 126 U/L   Total Bilirubin 1.5 (H) 0.0 - 1.2 mg/dL   GFR, Estimated >09 >60 mL/min   Anion gap 17 (H) 5 - 15  CBC     Status: None   Collection Time: 06/20/23  2:01 PM  Result Value Ref Range   WBC 5.1 4.0 - 10.5 K/uL   RBC 4.00 3.87 - 5.11 MIL/uL   Hemoglobin 12.6 12.0 - 15.0 g/dL   HCT 45.4 09.8 - 11.9 %  MCV 95.8 80.0 - 100.0 fL   MCH 31.5 26.0 - 34.0 pg   MCHC 32.9 30.0 - 36.0 g/dL   RDW 40.1 02.7 - 25.3 %   Platelets 158 150 - 400 K/uL   nRBC 0.0 0.0 - 0.2 %   Urinalysis, Routine w reflex microscopic -Urine, Clean Catch     Status: Abnormal   Collection Time: 06/20/23  8:42 PM  Result Value Ref Range   Color, Urine YELLOW YELLOW   APPearance CLEAR CLEAR   Specific Gravity, Urine >1.046 (H) 1.005 - 1.030   pH 5.0 5.0 - 8.0   Glucose, UA NEGATIVE NEGATIVE mg/dL   Hgb urine dipstick MODERATE (A) NEGATIVE   Bilirubin Urine NEGATIVE NEGATIVE   Ketones, ur 20 (A) NEGATIVE mg/dL   Protein, ur NEGATIVE NEGATIVE mg/dL   Nitrite NEGATIVE NEGATIVE   Leukocytes,Ua TRACE (A) NEGATIVE   RBC / HPF 11-20 0 - 5 RBC/hpf   WBC, UA 0-5 0 - 5 WBC/hpf   Bacteria, UA RARE (A) NONE SEEN   Squamous Epithelial / HPF 0-5 0 - 5 /HPF   Mucus PRESENT    Hyaline Casts, UA PRESENT    Basic Metabolic Panel: Recent Labs  Lab 06/20/23 1401  NA 139  K 3.1*  CL 97*  CO2 25  GLUCOSE 101*  BUN 21  CREATININE 0.66  CALCIUM  9.6   Liver Function Tests: Recent Labs  Lab 06/20/23 1401  AST 18  ALT 22  ALKPHOS 38  BILITOT 1.5*  PROT 7.4  ALBUMIN 4.3   Recent Labs  Lab 06/20/23 1401  LIPASE 29   No results for input(s): "AMMONIA" in the last 168 hours. CBC: Recent Labs  Lab 06/20/23 1401  WBC 5.1  HGB 12.6  HCT 38.3  MCV 95.8  PLT 158   Cardiac Enzymes: No results for input(s): "CKTOTAL", "CKMB", "CKMBINDEX", "TROPONINIHS" in the last 168 hours.  BNP (last 3 results) No results for input(s): "PROBNP" in the last 8760 hours. CBG: No results for input(s): "GLUCAP" in the last 168 hours.  Radiological Exams on Admission:  CT ABDOMEN PELVIS W CONTRAST Result Date: 06/20/2023 CLINICAL DATA:  Vomiting intermittently for a month. No p.o. intake in 24 hours. Abdominal pain. EXAM: CT ABDOMEN AND PELVIS WITH CONTRAST TECHNIQUE: Multidetector CT imaging of the abdomen and pelvis was performed using the standard protocol following bolus administration of intravenous contrast. RADIATION DOSE REDUCTION: This exam was performed according to the departmental  dose-optimization program which includes automated exposure control, adjustment of the mA and/or kV according to patient size and/or use of iterative reconstruction technique. CONTRAST:  OMNIPAQUE  IOHEXOL  300 MG/ML  SOLN COMPARISON:  Abdominal ultrasound 08/04/2022 and CT 08/04/2022 FINDINGS: Lower chest: No acute abnormality. Hepatobiliary: Hepatic steatosis. Normal gallbladder. No biliary dilation. Pancreas: Unremarkable. Spleen: Unremarkable. Adrenals/Urinary Tract: Normal adrenal glands. No urinary calculi or hydronephrosis. Bladder is unremarkable. Stomach/Bowel: Normal caliber large and small bowel. Postoperative change of right colectomy with ileocolonic anastomosis in the right upper quadrant. Colonic diverticulosis without diverticulitis. No bowel wall thickening. The appendix is normal.Stomach is within normal limits. Vascular/Lymphatic: Aortic atherosclerosis. No enlarged abdominal or pelvic lymph nodes. Reproductive: Unremarkable. Other: No free intraperitoneal fluid or air. Musculoskeletal: No acute fracture. Postoperative change about the lumbar spine from L2-L5. Unchanged fat containing right lumbar hernia. IMPRESSION: 1. No acute abnormality in the abdomen or pelvis. 2. Hepatic steatosis. 3.  Aortic Atherosclerosis (ICD10-I70.0). Electronically Signed   By: Rozell Cornet M.D.   On: 06/20/2023 20:16  No intake/output data recorded. No intake/output data recorded.        Assessment and Plan: Gastroenteritis Patient describes multiple episodes of vomiting that started approximately 10 days ago.  However remitted at least 2 days ago.  At this time abdomen soft nontender.  Patient is hungry.  Will start diet and trend. Check troponin  Adult wellness visit Patient is essentially brought into the ER after wellness check because patient did not answer phone call to her home.  Patient is found to have mild electrolyte abnormalities such as hypokalemia, I will check a magnesium  and  replace hypokalemia.  Patient also has not received her food supply at home for at least 24 hours, to me she tells she has not eaten in 48 hours.  Therefore I think this will need social work evaluation.  UTI (urinary tract infection) This was diagnosed by ER attending based on increased RBCs in urine.  I will check a CK to make sure were not missing rhabdomyolysis.  Treatment started with ceftriaxone .  Culture is pending.  Continue with same   Confusion - see hpi. I think this was likely a metabolic due to emesis, possible uti. Exam is non focal , it was mild and I believe resolved. I do not think a CT head will be very low yield as presentation not consistent with CVA. Check B12 Tsh   Advance Care Planning:   Code Status: Prior  DNR based on discussion with patient and Niece. Consistent with prior documentation of wishes.  Consults: none  Family Communication: I called Niece Elida Grounds on cell phone [phone] - she is NOT the legal guardian.   Severity of Illness: The appropriate patient status for this patient is OBSERVATION. Observation status is judged to be reasonable and necessary in order to provide the required intensity of service to ensure the patient's safety. The patient's presenting symptoms, physical exam findings, and initial radiographic and laboratory data in the context of their medical condition is felt to place them at decreased risk for further clinical deterioration. Furthermore, it is anticipated that the patient will be medically stable for discharge from the hospital within 2 midnights of admission.   Author: Bennie Brave, MD 06/20/2023 10:35 PM  For on call review [email or web address].

## 2023-06-21 DIAGNOSIS — K529 Noninfective gastroenteritis and colitis, unspecified: Secondary | ICD-10-CM | POA: Diagnosis not present

## 2023-06-21 LAB — APTT: aPTT: 39 s — ABNORMAL HIGH (ref 24–36)

## 2023-06-21 LAB — CBC
HCT: 36.4 % (ref 36.0–46.0)
HCT: 34.8 % — ABNORMAL LOW (ref 36.0–46.0)
Hemoglobin: 11.8 g/dL — ABNORMAL LOW (ref 12.0–15.0)
Hemoglobin: 11.1 g/dL — ABNORMAL LOW (ref 12.0–15.0)
MCH: 31.3 pg (ref 26.0–34.0)
MCH: 31.1 pg (ref 26.0–34.0)
MCHC: 32.4 g/dL (ref 30.0–36.0)
MCHC: 31.9 g/dL (ref 30.0–36.0)
MCV: 96.6 fL (ref 80.0–100.0)
MCV: 97.5 fL (ref 80.0–100.0)
Platelets: 142 10*3/uL — ABNORMAL LOW (ref 150–400)
Platelets: 133 10*3/uL — ABNORMAL LOW (ref 150–400)
RBC: 3.77 MIL/uL — ABNORMAL LOW (ref 3.87–5.11)
RBC: 3.57 MIL/uL — ABNORMAL LOW (ref 3.87–5.11)
RDW: 13.3 % (ref 11.5–15.5)
RDW: 13.2 % (ref 11.5–15.5)
WBC: 5.6 10*3/uL (ref 4.0–10.5)
WBC: 5.3 10*3/uL (ref 4.0–10.5)
nRBC: 0 % (ref 0.0–0.2)
nRBC: 0 % (ref 0.0–0.2)

## 2023-06-21 LAB — URINE CULTURE: Culture: 50000 — AB

## 2023-06-21 LAB — TROPONIN I (HIGH SENSITIVITY): Troponin I (High Sensitivity): 10 ng/L (ref <18)

## 2023-06-21 LAB — BASIC METABOLIC PANEL
Anion gap: 9 (ref 5–15)
BUN: 16 mg/dL (ref 8–23)
CO2: 26 mmol/L (ref 22–32)
Calcium: 8.8 mg/dL — ABNORMAL LOW (ref 8.9–10.3)
Chloride: 101 mmol/L (ref 98–111)
Creatinine, Ser: 0.57 mg/dL (ref 0.44–1.00)
GFR, Estimated: >60 mL/min (ref >60)
Glucose, Bld: 82 mg/dL (ref 70–99)
Potassium: 3.1 mmol/L — ABNORMAL LOW (ref 3.5–5.1)
Sodium: 136 mmol/L (ref 135–145)

## 2023-06-21 LAB — VITAMIN B12: Vitamin B-12: 217 pg/mL (ref 180–914)

## 2023-06-21 LAB — CK: Total CK: 65 U/L (ref 38–234)

## 2023-06-21 LAB — TSH: TSH: 0.51 u[IU]/mL (ref 0.350–4.500)

## 2023-06-21 LAB — CREATININE, SERUM
Creatinine, Ser: 0.49 mg/dL (ref 0.44–1.00)
GFR, Estimated: >60 mL/min (ref >60)

## 2023-06-21 LAB — PROTIME-INR
INR: 1.2 (ref 0.8–1.2)
Prothrombin Time: 15.8 s — ABNORMAL HIGH (ref 11.4–15.2)

## 2023-06-21 LAB — MAGNESIUM: Magnesium: 1.7 mg/dL (ref 1.7–2.4)

## 2023-06-21 MED ORDER — POTASSIUM CHLORIDE 10 MEQ/100ML IV SOLN
10.0000 meq | INTRAVENOUS | Status: AC
  Administered 2023-06-21 (×4): 10 meq via INTRAVENOUS
  Filled 2023-06-21 (×4): qty 100

## 2023-06-21 NOTE — TOC Initial Note (Signed)
Transition of Care [facility]) - Initial/Assessment Note    Patient Details  Name: Kathryn Romero MRN: [id number] Date of Birth: 12-May-1935  [facility]) CM/SW Contact:    Diona Browner, LCSW Phone Number: 06/21/2023, 4:04 PM  Clinical Narrative:                  Pt recommended for SNF for rehab. Pt in agreement w/ recommendation. FL2 completed and SNF referrals sent out. Awaiting bed offers. MOON completed.  Expected Discharge Plan: Skilled Nursing Facility Barriers to Discharge: Continued Medical Work up, SNF Pending bed offer   Patient Goals and CMS Choice Patient states their goals for this hospitalization and ongoing recovery are:: return home CMS Medicare.gov Compare Post Acute Care list provided to:: Patient Choice offered to / list presented to : Patient [place] ownership interest in [facility].provided to:: Patient    Expected Discharge Plan and Services   Discharge Planning Services: NA   Living arrangements for the past 2 months: Single Family Home                   DME Agency: NA                  Prior Living Arrangements/Services Living arrangements for the past 2 months: Single Family Home Lives with:: Self Patient language and need for interpreter reviewed:: Yes Do you feel safe going back to the place where you live?: Yes      Need for Family Participation in Patient Care: Yes (Comment) Care giver support system in place?: Yes (comment) Current home services: Other (comment) (PCS) Criminal Activity/Legal Involvement Pertinent to Current Situation/Hospitalization: No - Comment as needed  Activities of Daily Living   ADL Screening (condition at time of admission) Independently performs ADLs?: No Does the patient have a NEW difficulty with bathing/dressing/toileting/self-feeding that is expected to last >3 days?: No Does the patient have a NEW difficulty with getting in/out of bed, walking, or climbing stairs that is  expected to last >3 days?: No Does the patient have a NEW difficulty with communication that is expected to last >3 days?: No Is the patient deaf or have difficulty hearing?: Yes Does the patient have difficulty seeing, even when wearing glasses/contacts?: Yes Does the patient have difficulty concentrating, remembering, or making decisions?: No  Permission Sought/Granted                  Emotional Assessment Appearance:: Appears stated age Attitude/Demeanor/Rapport: Engaged Affect (typically observed): Accepting Orientation: : Oriented to Self, Oriented to Place, Oriented to  Time, Oriented to Situation Alcohol / Substance Use: Not Applicable Psych Involvement: No (comment)  Admission diagnosis:  Gastroenteritis [K52.9] Pain of upper abdomen [R10.10] Patient Active Problem List   Diagnosis Date Noted   Adult wellness visit 06/20/2023   Gastroenteritis 06/20/2023   Diarrhea 02/22/2022   Abdominal pain 02/22/2022   Nausea & vomiting 02/20/2022   Hypokalemia 02/20/2022   Legal blindness 02/20/2022   Degenerative lumbar spinal stenosis 01/06/2018   Spondylolisthesis at L4-L5 level 11/14/2015   DOE (dyspnea on exertion) 10/14/2014   Generalized weakness 09/19/2014   UTI (urinary tract infection) 09/19/2014   Obese 05/20/2014   Nausea with vomiting 04/16/2009   RETINITIS PIGMENTOSA 12/04/2008   GLUCOSE INTOLERANCE 05/09/2007   Dyslipidemia 05/09/2007   ANEMIA-NOS 05/09/2007   ANXIETY 05/09/2007   Essential hypertension 05/09/2007   DIVERTICULOSIS, COLON 05/09/2007   BACK PAIN 05/09/2007   History of malignant neoplasm of large intestine  05/09/2007   ARTHRITIS, TRAUMATIC, UNSPECIFIED SITE 12/17/2006   PCP:  Chilton Greathouse, MD Pharmacy:   [facility] [phone] - [place], [place] - [address] AT [facility] OF [address] [place] [address] Phone: [phone] Fax: [phone]  [id number] - [place],  [place] - [address] AT [facility] OF [address] [place] [address] Phone: [phone] Fax: [phone]     Social Drivers of Health (SDOH) Social History: SDOH Screenings   Food Insecurity: No Food Insecurity (06/21/2023)  Housing: Unknown (06/21/2023)  Transportation Needs: Unmet Transportation Needs (06/21/2023)  Utilities: Not At Risk (06/21/2023)  Social Connections: Patient Declined (06/21/2023)  Tobacco Use: Low Risk  (06/20/2023)   SDOH Interventions:     Readmission Risk Interventions    02/22/2022    8:25 AM  Readmission Risk Prevention Plan  Post Dischage Appt Complete  Medication Screening Complete  Transportation Screening Complete

## 2023-06-21 NOTE — Progress Notes (Signed)
PT Cancellation Note  Patient Details Name: Kathryn Romero MRN: [id number] DOB: 1936/05/05   Cancelled Treatment:    Reason Eval/Treat Not Completed: Medical issues which prohibited therapy (BP 193/143. Will follow.)  Tamala Ser PT 06/21/2023  Acute Rehabilitation Services  Office [phone]

## 2023-06-21 NOTE — Evaluation (Signed)
 Physical Therapy Evaluation Patient Details Name: Kathryn Romero MRN: [id number] DOB: [birth year] Today's Date: 06/21/2023  History of Present Illness  88 y.o. female admitted with nausea, vomiting, AMS. Dx of gastroenteritis. PMH: blindness, anxiety, colon cancer, back surgery x2, GERD.  Clinical Impression  Pt admitted with above diagnosis. Pt oriented to self only. She stated she walks with a walker at home, that she has an aide who assists with bathing 2x/week, and other people bring her groceries. Pt required mod assist for bed mobility, min assist for sit to stand, and min assist to ambulate 4' with RW, with a shuffling gait, distance limited by unsteadiness and fatigue.  She is not safe to DC home alone. Patient will benefit from continued inpatient follow up therapy, <3 hours/day. Pt currently with functional limitations due to the deficits listed below (see PT Problem List). Pt will benefit from acute skilled PT to increase their independence and safety with mobility to allow discharge.           If plan is discharge home, recommend the following: A lot of help with walking and/or transfers;A lot of help with bathing/dressing/bathroom;Assistance with cooking/housework;Assist for transportation;Help with stairs or ramp for entrance   Can travel by private vehicle   No    Equipment Recommendations None recommended by PT  Recommendations for Other Services       Functional Status Assessment Patient has had a recent decline in their functional status and demonstrates the ability to make significant improvements in function in a reasonable and predictable amount of time.     Precautions / Restrictions Precautions Precautions: Fall Restrictions Weight Bearing Restrictions Per Provider Order: No      Mobility  Bed Mobility Overal bed mobility: Needs Assistance Bed Mobility: Supine to Sit     Supine to sit: Mod assist     General bed mobility comments: assist to raise  trunk    Transfers Overall transfer level: Needs assistance Equipment used: Rolling walker (2 wheels) Transfers: Sit to/from Stand Sit to Stand: Min assist           General transfer comment: VCs for hand placement, min A to power up    Ambulation/Gait Ambulation/Gait assistance: Min assist Gait Distance (Feet): 4 Feet Assistive device: Rolling walker (2 wheels) Gait Pattern/deviations: Step-to pattern, Decreased stride length, Shuffle Gait velocity: decr     General Gait Details: min A for balance, pt unsteady, distance limited by unsteadiness and fatigue, Verbal/manual cues to navigate obstacles 2* blindness  Stairs            Wheelchair Mobility     Tilt Bed    Modified Rankin (Stroke Patients Only)       Balance Overall balance assessment: Needs assistance Sitting-balance support: Feet supported, No upper extremity supported Sitting balance-Leahy Scale: Fair     Standing balance support: Bilateral upper extremity supported, During functional activity, Reliant on assistive device for balance Standing balance-Leahy Scale: Poor                               Pertinent Vitals/Pain Pain Assessment Pain Assessment: No/denies pain    Home Living Family/patient expects to be discharged to:: Private residence Living Arrangements: Alone Available Help at Discharge: Family;Available PRN/intermittently;Friend(s) Type of Home: House Home Access: Stairs to enter Entrance Stairs-Rails: Doctor, general practice of Steps: 3   Home Layout: One level Home Equipment: Rollator (4 wheels) Additional Comments: pt poor historian, oriented to self  only, info obtained from prior PT eval 08/01/22    Prior Function Prior Level of Function : Needs assist;Patient poor historian/Family not available             Mobility Comments: uses rollator, "someone pushes me in a chair" when going out ADLs Comments: reports an aide comes twice a week to help  with bathing, other people bring groceries     Extremity/Trunk Assessment        Lower Extremity Assessment Lower Extremity Assessment: Generalized weakness    Cervical / Trunk Assessment Cervical / Trunk Assessment: Normal  Communication   Communication Communication: Impaired Factors Affecting Communication: Hearing impaired    Cognition Arousal: Lethargic Behavior During Therapy: WFL for tasks assessed/performed   PT - Cognitive impairments: Orientation, Memory, No family/caregiver present to determine baseline   Orientation impairments: Place, Time, Situation                   PT - Cognition Comments: pt repeatedly asked where she is, she did not retain information presented to her Following commands: Impaired       Cueing       General Comments      Exercises     Assessment/Plan    PT Assessment Patient needs continued PT services  PT Problem List Decreased balance;Decreased activity tolerance;Decreased mobility       PT Treatment Interventions Gait training;Functional mobility training;Therapeutic activities;Balance training;Patient/family education;Therapeutic exercise    PT Goals (Current goals can be found in the Care Plan section)  Acute Rehab PT Goals Patient Stated Goal: to go home PT Goal Formulation: With patient Time For Goal Achievement: 07/05/23 Potential to Achieve Goals: Good    Frequency Min 1X/week     Co-evaluation               AM-PAC PT "6 Clicks" Mobility  Outcome Measure Help needed turning from your back to your side while in a flat bed without using bedrails?: A Lot Help needed moving from lying on your back to sitting on the side of a flat bed without using bedrails?: A Lot Help needed moving to and from a bed to a chair (including a wheelchair)?: A Little Help needed standing up from a chair using your arms (e.g., wheelchair or bedside chair)?: A Little Help needed to walk in hospital room?: A Lot Help  needed climbing 3-5 steps with a railing? : Total 6 Click Score: 13    End of Session Equipment Utilized During Treatment: Gait belt Activity Tolerance: Patient limited by fatigue Patient left: in bed;with call bell/phone within reach Nurse Communication: Mobility status PT Visit Diagnosis: Muscle weakness (generalized) (M62.81);Difficulty in walking, not elsewhere classified (R26.2);Other abnormalities of gait and mobility (R26.89);Unsteadiness on feet (R26.81)    Time: 1610-9604 PT Time Calculation (min) (ACUTE ONLY): 37 min   Charges:   PT Evaluation $PT Eval Moderate Complexity: 1 Mod PT Treatments $Therapeutic Activity: 8-22 mins PT General Charges $$ ACUTE PT VISIT: 1 Visit         Tamala Ser PT 06/21/2023  Acute Rehabilitation Services  Office [phone]

## 2023-06-21 NOTE — Care Management Obs Status (Signed)
MEDICARE OBSERVATION STATUS NOTIFICATION   Patient Details  Name: Kathryn Romero MRN: [id number] Date of Birth: Apr 23, 1936   Medicare Observation Status Notification Given:  Yes    Diona Browner, LCSW 06/21/2023, 3:45 PM

## 2023-06-21 NOTE — Plan of Care (Signed)
  Problem: Education: Goal: Knowledge of General Education information will improve Description: Including pain rating scale, medication(s)/side effects and non-pharmacologic comfort measures Outcome: Progressing   Problem: Health Behavior/Discharge Planning: Goal: Ability to manage health-related needs will improve Outcome: Progressing   Problem: Clinical Measurements: Goal: Ability to maintain clinical measurements within normal limits will improve Outcome: Progressing Goal: Will remain free from infection Outcome: Progressing Goal: Diagnostic test results will improve Outcome: Progressing Goal: Respiratory complications will improve Outcome: Progressing   Problem: Nutrition: Goal: Adequate nutrition will be maintained Outcome: Progressing   Problem: Coping: Goal: Level of anxiety will decrease Outcome: Progressing   Problem: Elimination: Goal: Will not experience complications related to bowel motility Outcome: Progressing Goal: Will not experience complications related to urinary retention Outcome: Progressing   Problem: Pain Managment: Goal: General experience of comfort will improve and/or be controlled Outcome: Progressing   Problem: Safety: Goal: Ability to remain free from injury will improve Outcome: Progressing   Problem: Skin Integrity: Goal: Risk for impaired skin integrity will decrease Outcome: Progressing

## 2023-06-21 NOTE — NC FL2 (Signed)
[place] MEDICAID FL2 LEVEL OF CARE FORM     IDENTIFICATION  Patient Name: Kathryn Romero Birthdate: 1935-07-16 Sex: female Admission Date (Current Location): 06/20/2023  [facility] and [place] Number:  Producer, television/film/video and Address:  [facility],  [address], [place] [address]      Provider Number: [id number]  Attending Physician Name and Address:  Fran Lowes, DO  Relative Name and Phone Number:  Cage,Phyllis Biomedical engineer)  [phone] (Mobile)    Current Level of Care: Hospital Recommended Level of Care: Skilled Nursing Facility Prior Approval Number:    Date Approved/Denied: 11/16/15 PASRR Number: [id number]  Discharge Plan: SNF    Current Diagnoses: Patient Active Problem List   Diagnosis Date Noted   Adult wellness visit 06/20/2023   Gastroenteritis 06/20/2023   Diarrhea 02/22/2022   Abdominal pain 02/22/2022   Nausea & vomiting 02/20/2022   Hypokalemia 02/20/2022   Legal blindness 02/20/2022   Degenerative lumbar spinal stenosis 01/06/2018   Spondylolisthesis at L4-L5 level 11/14/2015   DOE (dyspnea on exertion) 10/14/2014   Generalized weakness 09/19/2014   UTI (urinary tract infection) 09/19/2014   Obese 05/20/2014   Nausea with vomiting 04/16/2009   RETINITIS PIGMENTOSA 12/04/2008   GLUCOSE INTOLERANCE 05/09/2007   Dyslipidemia 05/09/2007   ANEMIA-NOS 05/09/2007   ANXIETY 05/09/2007   Essential hypertension 05/09/2007   DIVERTICULOSIS, COLON 05/09/2007   BACK PAIN 05/09/2007   History of malignant neoplasm of large intestine 05/09/2007   ARTHRITIS, TRAUMATIC, UNSPECIFIED SITE 12/17/2006    Orientation RESPIRATION BLADDER Height & Weight     Self, Time, Situation, Place  Normal Incontinent Weight: 160 lb 0.9 oz (72.6 kg) Height:  5\' 1"  (154.9 cm)  BEHAVIORAL SYMPTOMS/MOOD NEUROLOGICAL BOWEL NUTRITION STATUS      Incontinent Diet (regular)  AMBULATORY STATUS COMMUNICATION OF NEEDS Skin   Limited Assist Verbally Normal                        Personal Care Assistance Level of Assistance  Feeding, Bathing, Dressing Bathing Assistance: Limited assistance Feeding assistance: Limited assistance Dressing Assistance: Limited assistance     Functional Limitations Info  Speech, Sight, Hearing Sight Info: Impaired Hearing Info: Impaired Speech Info: Adequate    SPECIAL CARE FACTORS FREQUENCY  PT (By licensed PT), OT (By licensed OT)     PT Frequency: 5x/wk OT Frequency: 5x/wk            Contractures Contractures Info: Not present    Additional Factors Info  Code Status, Allergies Code Status Info: DNR Allergies Info: Codeine  Hydromet (Hydrocodone Bit-homatrop Mbr)  Fish Allergy  Cephalexin  Morphine  Sulfa Antibiotics           Current Medications (06/21/2023):  This is the current hospital active medication list Current Facility-Administered Medications  Medication Dose Route Frequency Provider Last Rate Last Admin   acetaminophen (TYLENOL) tablet 650 mg  650 mg Oral Q6H PRN Nolberto Hanlon, MD       Or   acetaminophen (TYLENOL) suppository 650 mg  650 mg Rectal Q6H PRN Nolberto Hanlon, MD       cefTRIAXone (ROCEPHIN) 2 g in sodium chloride 0.9 % 100 mL IVPB  2 g Intravenous Q24H Nolberto Hanlon, MD       enoxaparin (LOVENOX) injection 40 mg  40 mg Subcutaneous Q24H Nolberto Hanlon, MD   40 mg at 06/21/23 1030   HYDROmorphone (DILAUDID) injection 0.5 mg  0.5 mg Intravenous Once Nolberto Hanlon, MD  pantoprazole (PROTONIX) injection 40 mg  40 mg Intravenous QHS Nolberto Hanlon, MD   40 mg at 06/21/23 0032   polyethylene glycol (MIRALAX / GLYCOLAX) packet 17 g  17 g Oral Daily PRN Nolberto Hanlon, MD       senna-docusate (Senokot-S) tablet 2 tablet  2 tablet Oral BID Nolberto Hanlon, MD   2 tablet at 06/21/23 0035   sodium chloride flush (NS) 0.9 % injection 3 mL  3 mL Intravenous Q12H Nolberto Hanlon, MD   3 mL at 06/21/23 1050     Discharge Medications: Please see discharge summary for a list of discharge  medications.  Relevant Imaging Results:  Relevant Lab Results:   Additional Information SSN [id number]  Diona Browner, LCSW

## 2023-06-21 NOTE — Progress Notes (Signed)
Progress Note   Patient: Kathryn Romero ZOX:[id number] DOB: [birth year] DOA: 06/20/2023     0 DOS: the patient was seen and examined on 06/21/2023   Brief hospital course: The patient is an extremely hard of hearing and blind 88 year old woman. She presented to [facility] ED via ambulance following a wellness check that was called after the patient had not been seen or heard from for a while. According to the patient's niece when the person went to check on her, she was found to be in bed and confused talking about her dead husband. She apparently has some baseline memory issues. The patient relates that 7-10 days ago she had an illness that involved nausea and vomiting. It seems that this was severe, and that she vomited any time that she moved/continuously. It seems that that resolved, but then the patient states that she did not receive any meals as she usually does for at least a couple of days.   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 There was no diverticulitis, no bowel wall thickening, normal appendix and normal stomach. UA was equivocal for UTI. Urine culture was obtained and has grown out 50K colonies of yeast. She was also fund to have a potassium of 3.1. Bilirubin was increased to 3.1. She had no increased WBC and Hgb of 12.6. Hgb has decreased to 11.1.  The patient has been admitted an TOC has been consulted. PT/OT has also been consulted. The patient may need placement, or at least get any problems with her meals on wheels sorted out.  Assessment and Plan: * Gastroenteritis Patient describes multiple episodes of vomiting that started approximately 10 days ago.  However remitted at least 2 days ago.  At this time abdomen soft nontender.  Patient is hungry.  Will start diet and trend. Troponin was normal at 10. Monitor patient input.  Adult wellness  visit Patient is essentially brought into the ER after wellness check because patient did not answer phone call to her home.  Patient is found to have mild electrolyte abnormalities such as hypokalemia, I will check a magnesium and replace hypokalemia.  Patient also has not received her food supply at home for at least 24 hours, to me she tells she has not eaten in 48 hours.  TOC has been consulted.  Will also have patient evaluated by PT OT to see if she would benefit from some short term rehab.  Hypokalemia: Supplement and monitor.  UTI (urinary tract infection) Will discontinue ceftriaxone. Urine culture is positive only for yeast. UA was equivocal at best for UTI.  Subjective: The patient is resting comfortably in med. No new complaints.  Physical Exam: Vitals:   06/21/23 0800 06/21/23 1107 06/21/23 1218 06/21/23 1503  BP: (!) 176/86 (!) 142/80 137/65 134/71  Pulse: 64 68 72 66  Resp: 18 16 18 20   Temp: 97.8 F (36.6 C)  98.3 F (36.8 C) 97.9 F (36.6 C)  TempSrc: Oral  Oral   SpO2: 100% 95% 98% 97%  Weight:      Height:       Exam:  Constitutional:  The patient is awake, alert, and oriented x 3. She is blind and EXTREMELY hard of hearing.  Respiratory:  No increased work of breathing. No wheezes, rales, or rhonchi No tactile fremitus Cardiovascular:  Regular rate and rhythm No murmurs, ectopy, or gallups. No lateral  PMI. No thrills. Abdomen:  Abdomen is soft, non-tender, non-distended No hernias, masses, or organomegaly Normoactive bowel sounds.  Musculoskeletal:  No cyanosis, clubbing, or edema Skin:  No rashes, lesions, ulcers palpation of skin: no induration or nodules   Data Reviewed:  CBC BMP CT abdomen and pelvis  Family Communication: None available  Disposition: Status is: Observation The patient remains OBS appropriate and will d/c before 2 midnights.  Planned Discharge Destination:  tbd    Time spent: 38 minutes  Author: Savir Blanke,  DO 06/21/2023 7:04 PM  For on call review [email or web address].

## 2023-06-22 DIAGNOSIS — E876 Hypokalemia: Secondary | ICD-10-CM

## 2023-06-22 DIAGNOSIS — K529 Noninfective gastroenteritis and colitis, unspecified: Secondary | ICD-10-CM | POA: Diagnosis not present

## 2023-06-22 DIAGNOSIS — Z0000 Encounter for general adult medical examination without abnormal findings: Secondary | ICD-10-CM

## 2023-06-22 LAB — BASIC METABOLIC PANEL
Anion gap: 13 (ref 5–15)
BUN: 18 mg/dL (ref 8–23)
CO2: 26 mmol/L (ref 22–32)
Calcium: 9.3 mg/dL (ref 8.9–10.3)
Chloride: 97 mmol/L — ABNORMAL LOW (ref 98–111)
Creatinine, Ser: 0.55 mg/dL (ref 0.44–1.00)
GFR, Estimated: >60 mL/min (ref >60)
Glucose, Bld: 74 mg/dL (ref 70–99)
Potassium: 3 mmol/L — ABNORMAL LOW (ref 3.5–5.1)
Sodium: 136 mmol/L (ref 135–145)

## 2023-06-22 LAB — CBC WITH DIFFERENTIAL/PLATELET
Abs Immature Granulocytes: 0.03 10*3/uL (ref 0.00–0.07)
Basophils Absolute: 0 10*3/uL (ref 0.0–0.1)
Basophils Relative: 1 %
Eosinophils Absolute: 0.1 10*3/uL (ref 0.0–0.5)
Eosinophils Relative: 3 %
HCT: 36.7 % (ref 36.0–46.0)
Hemoglobin: 12.1 g/dL (ref 12.0–15.0)
Immature Granulocytes: 1 %
Lymphocytes Relative: 35 %
Lymphs Abs: 1.5 10*3/uL (ref 0.7–4.0)
MCH: 31.9 pg (ref 26.0–34.0)
MCHC: 33 g/dL (ref 30.0–36.0)
MCV: 96.8 fL (ref 80.0–100.0)
Monocytes Absolute: 0.4 10*3/uL (ref 0.1–1.0)
Monocytes Relative: 8 %
Neutro Abs: 2.3 10*3/uL (ref 1.7–7.7)
Neutrophils Relative %: 52 %
Platelets: 125 10*3/uL — ABNORMAL LOW (ref 150–400)
RBC: 3.79 MIL/uL — ABNORMAL LOW (ref 3.87–5.11)
RDW: 13.2 % (ref 11.5–15.5)
WBC: 4.4 10*3/uL (ref 4.0–10.5)
nRBC: 0 % (ref 0.0–0.2)

## 2023-06-22 MED ORDER — POTASSIUM CHLORIDE 10 MEQ/100ML IV SOLN
10.0000 meq | INTRAVENOUS | Status: DC
  Administered 2023-06-22 (×2): 10 meq via INTRAVENOUS
  Filled 2023-06-22 (×2): qty 100

## 2023-06-22 MED ORDER — POTASSIUM CHLORIDE CRYS ER 20 MEQ PO TBCR
40.0000 meq | EXTENDED_RELEASE_TABLET | ORAL | Status: AC
  Administered 2023-06-22 (×2): 40 meq via ORAL
  Filled 2023-06-22 (×2): qty 2

## 2023-06-22 NOTE — TOC Progression Note (Addendum)
Transition of Care [facility]) - Progression Note    Patient Details  Name: Ramani Riva MRN: [id number] Date of Birth: 03-May-1936  Transition of [facility]) CM/SW Contact  Diona Browner, [place] Phone Number: 06/22/2023, 10:16 AM  Clinical Narrative:     CSW reviewed bed offers w/ pt. Pt declined SNF placement and requests HHPT instead. HHPT set up with [facility]. MD notified and HHPT orders requested.    Expected Discharge Plan: Skilled Nursing Facility Barriers to Discharge: Continued Medical Work up, SNF Pending bed offer  Expected Discharge Plan and Services   Discharge Planning Services: NA   Living arrangements for the past 2 months: Single Family Home                   DME Agency: NA                   Social Determinants of Health (SDOH) Interventions SDOH Screenings   Food Insecurity: No Food Insecurity (06/21/2023)  Housing: Unknown (06/21/2023)  Transportation Needs: Unmet Transportation Needs (06/21/2023)  Utilities: Not At Risk (06/21/2023)  Social Connections: Patient Declined (06/21/2023)  Tobacco Use: Low Risk  (06/20/2023)    Readmission Risk Interventions    02/22/2022    8:25 AM  Readmission Risk Prevention Plan  Post Dischage Appt Complete  Medication Screening Complete  Transportation Screening Complete

## 2023-06-22 NOTE — Progress Notes (Signed)
 Physical Therapy Treatment Patient Details Name: Kathryn Romero MRN: [id number] DOB: [birth year] Today's Date: 06/22/2023   History of Present Illness 88 y.o. female admitted with nausea, vomiting, AMS. Dx of gastroenteritis. PMH: blindness, anxiety, colon cancer, back surgery x2, GERD.    PT Comments  Pt resistant to mobilizing but with max encouragement did agree to attempt getting out of bed. Pt stated she can't walk here because she's blind and deaf ( it appears she has hearing and visual impairments but isn't completely blind, nor deaf as she is able to hear when I speak loudly, and is able to use her phone for texting). Pt stated she is able to walk with a rollator at home and feels she'll be able to do that safely upon DC from hospital. Today she needed moderate assistance for bed mobility and ambulated 5' with a RW, then became too fatigued to continue. She has a caregiver 2 days/week who assists with bathing. She is currently requiring assistance for mobility, and does not seem to have 24/7 caregivers available, though it is difficult to ascertain this. 24/7 assistance recommended if she DCs home.    If plan is discharge home, recommend the following: A lot of help with walking and/or transfers;A lot of help with bathing/dressing/bathroom;Assistance with cooking/housework;Assist for transportation;Help with stairs or ramp for entrance   Can travel by private vehicle     Yes  Equipment Recommendations  None recommended by PT    Recommendations for Other Services       Precautions / Restrictions Precautions Precautions: Fall Precaution/Restrictions Comments: blind, HOH Restrictions Weight Bearing Restrictions Per Provider Order: No     Mobility  Bed Mobility Overal bed mobility: Needs Assistance Bed Mobility: Rolling, Sidelying to Sit Rolling: Min assist Sidelying to sit: Mod assist       General bed mobility comments: assist to raise trunk    Transfers Overall  transfer level: Needs assistance Equipment used: Rolling walker (2 wheels) Transfers: Sit to/from Stand Sit to Stand: Min assist           General transfer comment: VCs for hand placement, min A to power up    Ambulation/Gait Ambulation/Gait assistance: Min assist Gait Distance (Feet): 5 Feet Assistive device: Rolling walker (2 wheels) Gait Pattern/deviations: Step-to pattern, Decreased stride length, Shuffle Gait velocity: decr     General Gait Details: distance limited by fatigue, Verbal/manual cues to navigate obstacles 2* blindness   Stairs             Wheelchair Mobility     Tilt Bed    Modified Rankin (Stroke Patients Only)       Balance Overall balance assessment: Needs assistance Sitting-balance support: Feet supported, No upper extremity supported Sitting balance-Leahy Scale: Fair     Standing balance support: Bilateral upper extremity supported, During functional activity, Reliant on assistive device for balance Standing balance-Leahy Scale: Poor                              Communication Communication Communication: Impaired Factors Affecting Communication: Hearing impaired  Cognition Arousal: Alert Behavior During Therapy: Anxious   PT - Cognitive impairments: Problem solving, No family/caregiver present to determine baseline, Awareness                       PT - Cognition Comments: decreased insight to limitations, pt resistant to mobilizing, required max encouragement to participate,  she stated she can't walk now  because she's blind and deaf, but stated that she does walk with a rollator at home (while being blind and deaf), pt stated she'll be able to mobilize well at home with her familiar rollator and familiar surroundings but is only able to ambulate 5' with a walker and assistance here Following commands: Intact Following commands impaired: Only follows one step commands consistently    Cueing Cueing  Techniques: Verbal cues, Tactile cues  Exercises      General Comments        Pertinent Vitals/Pain Pain Assessment Pain Assessment: No/denies pain    Home Living                          Prior Function            PT Goals (current goals can now be found in the care plan section) Acute Rehab PT Goals Patient Stated Goal: to go home PT Goal Formulation: With patient Time For Goal Achievement: 07/05/23 Potential to Achieve Goals: Good Progress towards PT goals: Progressing toward goals    Frequency    Min 1X/week      PT Plan      Co-evaluation              AM-PAC PT "6 Clicks" Mobility   Outcome Measure  Help needed turning from your back to your side while in a flat bed without using bedrails?: A Little Help needed moving from lying on your back to sitting on the side of a flat bed without using bedrails?: A Lot Help needed moving to and from a bed to a chair (including a wheelchair)?: A Little Help needed standing up from a chair using your arms (e.g., wheelchair or bedside chair)?: A Little Help needed to walk in hospital room?: A Lot Help needed climbing 3-5 steps with a railing? : Total 6 Click Score: 14    End of Session Equipment Utilized During Treatment: Gait belt Activity Tolerance: Patient limited by fatigue Patient left: in chair;with call bell/phone within reach;with chair alarm set Nurse Communication: Mobility status PT Visit Diagnosis: Muscle weakness (generalized) (M62.81);Difficulty in walking, not elsewhere classified (R26.2);Other abnormalities of gait and mobility (R26.89);Unsteadiness on feet (R26.81)     Time: 1204-1227 PT Time Calculation (min) (ACUTE ONLY): 23 min  Charges:    $Gait Training: 8-22 mins $Therapeutic Activity: 8-22 mins PT General Charges $$ ACUTE PT VISIT: 1 Visit                     Ralene Bathe Kistler PT 06/22/2023  Acute Rehabilitation Services  Office [phone]

## 2023-06-22 NOTE — Progress Notes (Signed)
[facility]  PROGRESS NOTE  Kathryn Romero XBM:[id number] DOB: 17-Oct-1935 DOA: 06/20/2023 PCP: Chilton Greathouse, MD   Brief HPI:   88 year old woman. She presented to [facility] ED via ambulance following a wellness check that was called after the patient had not been seen or heard from for a while. According to the patient's niece when the person went to check on her, she was found to be in bed and confused talking about her dead husband. 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    Assessment/Plan:    Gastroenteritis -Presented with multiple episodes of vomiting -Resolved at this time -Tolerating diet well -Antibiotics stopped today  Hypokalemia -Potassium is 3.0 -Will replace potassium and follow BMP in am  Patient is blind and hard of hearing -Lives by herself -Wants to go home with home health PT -Home health PT order placed.   Medications     enoxaparin (LOVENOX) injection  40 mg Subcutaneous Q24H    HYDROmorphone (DILAUDID) injection  0.5 mg Intravenous Once   pantoprazole (PROTONIX) IV  40 mg Intravenous QHS   senna-docusate  2 tablet Oral BID   sodium chloride flush  3 mL Intravenous Q12H     Data Reviewed:   CBG:  No results for input(s): "GLUCAP" in the last 168 hours.  SpO2: 97 %    Vitals:   06/21/23 1218 06/21/23 1503 06/21/23 2055 06/22/23 0540  BP: 137/65 134/71 136/78 (!) 153/65  Pulse: 72 66 72 63  Resp: 18 20 18 19   Temp: 98.3 F (36.8 C) 97.9 F (36.6 C) 98.3 F (36.8 C) 97.6 F (36.4 C)  TempSrc: Oral  Oral Oral  SpO2: 98% 97% 97% 97%  Weight:      Height:          Data Reviewed:  Basic Metabolic Panel: Recent Labs  Lab 06/20/23 1401 06/21/23 0030  06/21/23 0527 06/22/23 0618  NA 139  --  136 136  K 3.1*  --  3.1* 3.0*  CL 97*  --  101 97*  CO2 25  --  26 26  GLUCOSE 101*  --  82 74  BUN 21  --  16 18  CREATININE 0.66 0.49 0.57 0.55  CALCIUM 9.6  --  8.8* 9.3  MG  --  1.7  --   --     CBC: Recent Labs  Lab 06/20/23 1401 06/21/23 0140 06/21/23 0527 06/22/23 0618  WBC 5.1 5.6 5.3 4.4  NEUTROABS  --   --   --  2.3  HGB 12.6 11.8* 11.1* 12.1  HCT 38.3 36.4 34.8* 36.7  MCV 95.8 96.6 97.5 96.8  PLT 158 142* 133* 125*    LFT Recent Labs  Lab 06/20/23 1401  AST 18  ALT 22  ALKPHOS 38  BILITOT 1.5*  PROT 7.4  ALBUMIN 4.3     Antibiotics: Anti-infectives (From admission, onward)    Start     Dose/Rate Route Frequency Ordered Stop   06/21/23 2200  cefTRIAXone (ROCEPHIN) 2 g in sodium chloride 0.9 % 100 mL IVPB        2 g 200 mL/hr over 30 Minutes Intravenous Every 24 hours 06/20/23 2245 06/23/23 2159   06/20/23 2145  cefTRIAXone (ROCEPHIN)  2 g in sodium chloride 0.9 % 100 mL IVPB        2 g 200 mL/hr over 30 Minutes Intravenous  Once 06/20/23 2140 06/20/23 2253        DVT prophylaxis: SCD  Code Status: DNR  Family Communication: No family at bedside   CONSULTS    Subjective   Denies nausea and vomiting.   Objective    Physical Examination:   General-appears in no acute distress Heart-S1-S2, regular, no murmur auscultated Lungs-clear to auscultation bilaterally, no wheezing or crackles auscultated Abdomen-soft, nontender, no organomegaly Extremities-no edema in the lower extremities Neuro-alert, oriented x3, no focal deficit noted   Status is: Inpatient:             Meredeth Ide   [facility] If 7PM-7AM, please contact night-coverage at [email or web address], Office  [phone]   06/22/2023, 8:49 AM  LOS: 0 days

## 2023-06-22 NOTE — Plan of Care (Signed)
  Problem: Education: Goal: Knowledge of General Education information will improve Description: Including pain rating scale, medication(s)/side effects and non-pharmacologic comfort measures Outcome: Progressing   Problem: Coping: Goal: Level of anxiety will decrease Outcome: Progressing   Problem: Elimination: Goal: Will not experience complications related to bowel motility Outcome: Progressing   Problem: Pain Managment: Goal: General experience of comfort will improve and/or be controlled Outcome: Progressing

## 2023-06-22 NOTE — Plan of Care (Signed)

## 2023-06-22 NOTE — Plan of Care (Signed)
  Problem: Education: Goal: Knowledge of General Education information will improve Description: Including pain rating scale, medication(s)/side effects and non-pharmacologic comfort measures Outcome: Progressing   Problem: Health Behavior/Discharge Planning: Goal: Ability to manage health-related needs will improve Outcome: Progressing   Problem: Clinical Measurements: Goal: Ability to maintain clinical measurements within normal limits will improve Outcome: Progressing Goal: Will remain free from infection Outcome: Progressing Goal: Diagnostic test results will improve Outcome: Progressing   Problem: Nutrition: Goal: Adequate nutrition will be maintained Outcome: Progressing   Problem: Coping: Goal: Level of anxiety will decrease Outcome: Progressing   Problem: Elimination: Goal: Will not experience complications related to bowel motility Outcome: Progressing   Problem: Pain Managment: Goal: General experience of comfort will improve and/or be controlled Outcome: Progressing   Problem: Safety: Goal: Ability to remain free from injury will improve Outcome: Progressing

## 2023-06-22 NOTE — TOC CM/SW Note (Signed)

## 2023-06-23 DIAGNOSIS — K529 Noninfective gastroenteritis and colitis, unspecified: Secondary | ICD-10-CM | POA: Diagnosis not present

## 2023-06-23 DIAGNOSIS — Z0000 Encounter for general adult medical examination without abnormal findings: Secondary | ICD-10-CM | POA: Diagnosis not present

## 2023-06-23 DIAGNOSIS — E876 Hypokalemia: Secondary | ICD-10-CM | POA: Diagnosis not present

## 2023-06-23 LAB — BASIC METABOLIC PANEL
Anion gap: 10 (ref 5–15)
BUN: 23 mg/dL (ref 8–23)
CO2: 27 mmol/L (ref 22–32)
Calcium: 9.3 mg/dL (ref 8.9–10.3)
Chloride: 102 mmol/L (ref 98–111)
Creatinine, Ser: 0.75 mg/dL (ref 0.44–1.00)
GFR, Estimated: >60 mL/min (ref >60)
Glucose, Bld: 168 mg/dL — ABNORMAL HIGH (ref 70–99)
Potassium: 3.2 mmol/L — ABNORMAL LOW (ref 3.5–5.1)
Sodium: 139 mmol/L (ref 135–145)

## 2023-06-23 MED ORDER — POTASSIUM CHLORIDE CRYS ER 20 MEQ PO TBCR
40.0000 meq | EXTENDED_RELEASE_TABLET | ORAL | Status: AC
  Administered 2023-06-23 (×2): 40 meq via ORAL
  Filled 2023-06-23 (×2): qty 2

## 2023-06-23 NOTE — Plan of Care (Signed)
  Problem: Education: Goal: Knowledge of General Education information will improve Description: Including pain rating scale, medication(s)/side effects and non-pharmacologic comfort measures Outcome: Progressing   Problem: Health Behavior/Discharge Planning: Goal: Ability to manage health-related needs will improve Outcome: Progressing   Problem: Clinical Measurements: Goal: Ability to maintain clinical measurements within normal limits will improve Outcome: Progressing Goal: Will remain free from infection Outcome: Progressing Goal: Diagnostic test results will improve Outcome: Progressing   Problem: Activity: Goal: Risk for activity intolerance will decrease Outcome: Progressing   Problem: Nutrition: Goal: Adequate nutrition will be maintained Outcome: Progressing   Problem: Coping: Goal: Level of anxiety will decrease Outcome: Progressing   Problem: Elimination: Goal: Will not experience complications related to bowel motility Outcome: Progressing Goal: Will not experience complications related to urinary retention Outcome: Progressing   Problem: Pain Managment: Goal: General experience of comfort will improve and/or be controlled Outcome: Progressing   Problem: Skin Integrity: Goal: Risk for impaired skin integrity will decrease Outcome: Progressing

## 2023-06-23 NOTE — Progress Notes (Addendum)
[facility]  PROGRESS NOTE  Kathryn Romero WRU:[id number] DOB: 26-Mar-1936 DOA: 06/20/2023 PCP: Chilton Greathouse, MD   Brief HPI:   88 year old woman. She presented to [facility] ED via ambulance following a wellness check that was called after the patient had not been seen or heard from for a while. According to the patient's niece when the person went to check on her, she was found to be in bed and confused talking about her dead husband. 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    Assessment/Plan:    Gastroenteritis -Presented with multiple episodes of vomiting -Resolved at this time -Tolerating diet well -Antibiotics stopped   Hypokalemia -Potassium is 3.2 -Will replace potassium with K. Dur 40 mEq p.o. x 2 -Follow BMP in a.m.  Patient is blind and hard of hearing -Lives by herself -Wants to go home with home health PT -Home health PT order placed.  Disposition -PT evaluation obtained, recommend skilled nursing facility.  Patient is refusing to go to skilled nursing facility and wants to go home.  She says that she gets caregivers at home 2 days a week.  Called and discussed with patient's niece who was concerned that patient should not go home and she should go to rehab.  She is going to speak to patient regarding going to skilled nursing facility for rehab.  Medications      HYDROmorphone (DILAUDID) injection  0.5 mg Intravenous Once   pantoprazole (PROTONIX) IV  40 mg Intravenous QHS   potassium chloride  40 mEq Oral Q4H   senna-docusate  2 tablet Oral BID   sodium chloride flush  3 mL Intravenous Q12H     Data Reviewed:   CBG:  No results for input(s): "GLUCAP" in the last 168 hours.  SpO2:  96 %    Vitals:   06/22/23 1347 06/22/23 2105 06/23/23 0523 06/23/23 1328  BP: (!) 119/57 (!) 141/62 (!) 165/69 (!) 145/74  Pulse: 72 74 68 74  Resp: 18 18 20 19   Temp: 98 F (36.7 C) 98.2 F (36.8 C) 98.1 F (36.7 C) 98.3 F (36.8 C)  TempSrc:  Oral Oral   SpO2: 95% 96% 96% 96%  Weight:      Height:          Data Reviewed:  Basic Metabolic Panel: Recent Labs  Lab 06/20/23 1401 06/21/23 0030 06/21/23 0527 06/22/23 0618 06/23/23 1029  NA 139  --  136 136 139  K 3.1*  --  3.1* 3.0* 3.2*  CL 97*  --  101 97* 102  CO2 25  --  26 26 27   GLUCOSE 101*  --  82 74 168*  BUN 21  --  16 18 23   CREATININE 0.66 0.49 0.57 0.55 0.75  CALCIUM 9.6  --  8.8* 9.3 9.3  MG  --  1.7  --   --   --     CBC: Recent Labs  Lab 06/20/23 1401 06/21/23 0140 06/21/23 0527 06/22/23 0618  WBC 5.1 5.6 5.3 4.4  NEUTROABS  --   --   --  2.3  HGB 12.6 11.8* 11.1* 12.1  HCT 38.3 36.4 34.8* 36.7  MCV 95.8 96.6 97.5 96.8  PLT 158 142* 133* 125*  LFT Recent Labs  Lab 06/20/23 1401  AST 18  ALT 22  ALKPHOS 38  BILITOT 1.5*  PROT 7.4  ALBUMIN 4.3     Antibiotics: Anti-infectives (From admission, onward)    Start     Dose/Rate Route Frequency Ordered Stop   06/21/23 2200  cefTRIAXone (ROCEPHIN) 2 g in sodium chloride 0.9 % 100 mL IVPB  Status:  Discontinued        2 g 200 mL/hr over 30 Minutes Intravenous Every 24 hours 06/20/23 2245 06/22/23 1138   06/20/23 2145  cefTRIAXone (ROCEPHIN) 2 g in sodium chloride 0.9 % 100 mL IVPB        2 g 200 mL/hr over 30 Minutes Intravenous  Once 06/20/23 2140 06/20/23 2253        DVT prophylaxis: SCD  Code Status: DNR  Family Communication: No family at bedside   CONSULTS    Subjective   Denies any complaints.  Wants to go home.  Refuses to go to skilled nursing facility.   Objective    Physical Examination:   Appears in no acute distress S1-S2, regular Lungs clear to auscultation bilaterally Abdomen is soft,  nontender, no organomegaly   Status is: Inpatient:             Meredeth Ide   [facility] If 7PM-7AM, please contact night-coverage at [email or web address], Office  [phone]   06/23/2023, 3:14 PM  LOS: 0 days

## 2023-06-24 DIAGNOSIS — K529 Noninfective gastroenteritis and colitis, unspecified: Secondary | ICD-10-CM | POA: Diagnosis not present

## 2023-06-24 DIAGNOSIS — R531 Weakness: Secondary | ICD-10-CM | POA: Diagnosis not present

## 2023-06-24 DIAGNOSIS — Z7401 Bed confinement status: Secondary | ICD-10-CM | POA: Diagnosis not present

## 2023-06-24 LAB — BASIC METABOLIC PANEL
Anion gap: 11 (ref 5–15)
BUN: 21 mg/dL (ref 8–23)
CO2: 27 mmol/L (ref 22–32)
Calcium: 9.7 mg/dL (ref 8.9–10.3)
Chloride: 102 mmol/L (ref 98–111)
Creatinine, Ser: 0.68 mg/dL (ref 0.44–1.00)
GFR, Estimated: >60 mL/min (ref >60)
Glucose, Bld: 98 mg/dL (ref 70–99)
Potassium: 4.1 mmol/L (ref 3.5–5.1)
Sodium: 140 mmol/L (ref 135–145)

## 2023-06-24 MED ORDER — CYANOCOBALAMIN 1000 MCG PO TABS: 1000.0000 ug | ORAL_TABLET | Freq: Every day | ORAL | 3 refills | Status: DC

## 2023-06-24 MED ORDER — POLYETHYLENE GLYCOL 3350 17 G PO PACK: 17.0000 g | PACK | Freq: Every day | ORAL | 0 refills | Status: DC | PRN

## 2023-06-24 NOTE — Progress Notes (Signed)
Physical Therapy Treatment Patient Details Name: Kathryn Romero MRN: [id number] DOB: [birth year] Today's Date: 06/24/2023   History of Present Illness 88 y.o. female admitted with nausea, vomiting, AMS. Dx of gastroenteritis. PMH: blindness, anxiety, colon cancer, back surgery x2, GERD.    PT Comments  Pt eventually agreeable to OOB to recliner with increased encouragement as she hopes to d/c home today.  Pt's friend arrived upon transferring and reported concern for pt's safety at home.  Pt reluctant to d/c anywhere but home despite concerns for weakness, fall risk, safety.  Pt feels she will be able to better manage at home due to her visual impairments.  Pt has steps to enter home and her friend states she physically will be unable to assist her if discharged today.  RN notified.     If plan is discharge home, recommend the following: A lot of help with walking and/or transfers;A lot of help with bathing/dressing/bathroom;Assistance with cooking/housework;Assist for transportation;Help with stairs or ramp for entrance   Can travel by private vehicle        Equipment Recommendations  None recommended by PT    Recommendations for Other Services       Precautions / Restrictions Precautions Precautions: Fall Precaution/Restrictions Comments: blind, HOH     Mobility  Bed Mobility Overal bed mobility: Needs Assistance       Supine to sit: Min assist     General bed mobility comments: light assist for technique as pt reporting her blindness is limiting    Transfers Overall transfer level: Needs assistance Equipment used: 2 person hand held assist Transfers: Sit to/from Stand Sit to Stand: Min assist           General transfer comment: HHA for support, pt only agreeable to OOB to recliner, assist for weakness    Ambulation/Gait                   Stairs             Wheelchair Mobility     Tilt Bed    Modified Rankin (Stroke Patients Only)        Balance Overall balance assessment: Needs assistance         Standing balance support: Bilateral upper extremity supported, During functional activity, Reliant on assistive device for balance Standing balance-Leahy Scale: Poor                              Communication Communication Communication: Impaired Factors Affecting Communication: Hearing impaired  Cognition Arousal: Alert Behavior During Therapy: WFL for tasks assessed/performed   PT - Cognitive impairments: Problem solving, Awareness                       PT - Cognition Comments: decreased insight to limitations, pt resistant to mobilizing, required max encouragement to participate,  continues to state she can't walk now because she's blind and deaf, but stated that she does walk with a rollator at home (while being blind and deaf), pt stated she'll be able to mobilize well at home with her familiar rollator and familiar surroundings Following commands: Intact Following commands impaired: Only follows one step commands consistently    Cueing Cueing Techniques: Verbal cues, Tactile cues  Exercises      General Comments        Pertinent Vitals/Pain Pain Assessment Pain Assessment: Faces Faces Pain Scale: Hurts a little bit Pain Location: chronic back pain  Pain Descriptors / Indicators: Sore Pain Intervention(s): Repositioned, Monitored during session    Home Living                          Prior Function            PT Goals (current goals can now be found in the care plan section) Progress towards PT goals: Progressing toward goals    Frequency    Min 1X/week      PT Plan      Co-evaluation              AM-PAC PT "6 Clicks" Mobility   Outcome Measure  Help needed turning from your back to your side while in a flat bed without using bedrails?: A Little Help needed moving from lying on your back to sitting on the side of a flat bed without using  bedrails?: A Little Help needed moving to and from a bed to a chair (including a wheelchair)?: A Little Help needed standing up from a chair using your arms (e.g., wheelchair or bedside chair)?: A Little Help needed to walk in hospital room?: A Lot Help needed climbing 3-5 steps with a railing? : Total 6 Click Score: 15    End of Session   Activity Tolerance: Patient limited by fatigue Patient left: in chair;with call bell/phone within reach;with family/visitor present Nurse Communication: Mobility status PT Visit Diagnosis: Difficulty in walking, not elsewhere classified (R26.2);Muscle weakness (generalized) (M62.81)     Time: 2536-6440 PT Time Calculation (min) (ACUTE ONLY): 26 min  Charges:    $Therapeutic Activity: 23-37 mins PT General Charges $$ ACUTE PT VISIT: 1 Visit                    Thomasene Mohair PT, DPT Physical Therapist Acute Rehabilitation Services Office: [phone]    Janan Halter [place] 06/24/2023, 12:52 PM

## 2023-06-24 NOTE — TOC Transition Note (Signed)
Transition of Care [facility]) - Discharge Note   Patient Details  Name: Kathryn Romero MRN: [id number] Date of Birth: Jun 30, 1935  [facility] of [facility]) CM/SW Contact:  Diona Browner, LCSW Phone Number: 06/24/2023, 10:50 AM   Clinical Narrative:     Pt medically ready to d/c. Pt declines SNF placement and will return home w/ HH services through [place]. Pt niece notified. No further TOC needs.   Final next level of care: Home w Home Health Services Barriers to Discharge: Barriers Resolved   Patient Goals and CMS Choice Patient states their goals for this hospitalization and ongoing recovery are:: return home CMS Medicare.gov Compare Post Acute Care list provided to:: Patient Choice offered to / list presented to : Patient [place] ownership interest in [facility].provided to:: Patient    Discharge Placement                       Discharge Plan and Services Additional resources added to the After Visit Summary for     Discharge Planning Services: NA              DME Agency: NA       HH Arranged: PT HH Agency: [facility] Date [facility] Agency Contacted: 06/22/23 Time HH Agency Contacted: 1200 Representative spoke with at [facility] Agency: Kandee Keen  Social Drivers of Health (SDOH) Interventions SDOH Screenings   Food Insecurity: No Food Insecurity (06/21/2023)  Housing: Unknown (06/21/2023)  Transportation Needs: Unmet Transportation Needs (06/21/2023)  Utilities: Not At Risk (06/21/2023)  Social Connections: Patient Declined (06/21/2023)  Tobacco Use: Low Risk  (06/20/2023)     Readmission Risk Interventions    02/22/2022    8:25 AM  Readmission Risk Prevention Plan  Post Dischage Appt Complete  Medication Screening Complete  Transportation Screening Complete

## 2023-06-24 NOTE — Discharge Summary (Signed)
Physician Discharge Summary   Patient: Kathryn Romero MRN: [id number] DOB: 10-26-1935  Admit date:     06/20/2023  Discharge date: 06/24/23  Discharge Physician: Meredeth Ide   PCP: Chilton Greathouse, MD   Recommendations at discharge:    Follow up PCP in 1 week  Discharge Diagnoses: Principal Problem:   Gastroenteritis Active Problems:   UTI (urinary tract infection)   Adult wellness visit  Resolved Problems:   * No resolved hospital problems. *  Hospital Course: 88 year old woman. She presented to [facility] ED via ambulance following a wellness check that was called after the patient had not been seen or heard from for a while. According to the patient's niece when the person went to check on her, she was found to be in bed and confused talking about her dead husband. 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   Assessment and Plan:   Gastroenteritis -Presented with multiple episodes of vomiting -Resolved at this time -Tolerating diet well -Antibiotics stopped    Hypokalemia -replete   Patient is blind and hard of hearing -Lives by herself -Wants to go home with home health PT, refuse to go to SNF. Discussed with patient's niece -Home health PT order placed.            Consultants:  Procedures performed:  Disposition: Home Diet recommendation:  Discharge Diet Orders (From admission, onward)     Start     Ordered   06/24/23 0000  Diet - low sodium heart healthy        06/24/23 1043           Regular diet DISCHARGE MEDICATION: Allergies as of 06/24/2023       Reactions   Codeine Shortness Of Breath   Hydromet [hydrocodone Bit-homatrop Mbr] Shortness Of Breath, Other (See Comments)    Wheezing   Fish Allergy Other (See Comments)   Burning Sensation and Headache   Cephalexin Nausea Only   Morphine Other (See Comments)   "feels funny"   Sulfa Antibiotics Nausea And Vomiting           Medication List     STOP taking these medications    Alpha-Lipoic Acid 100 MG Caps   cephALEXin 500 MG capsule Commonly known as: KEFLEX   famotidine 20 MG tablet Commonly known as: PEPCID   magnesium 30 MG tablet   nitrofurantoin (macrocrystal-monohydrate) 100 MG capsule Commonly known as: MACROBID   omeprazole 40 MG capsule Commonly known as: PRILOSEC   ondansetron 4 MG disintegrating tablet Commonly known as: ZOFRAN-ODT   polyethylene glycol powder 17 GM/SCOOP powder Commonly known as: GLYCOLAX/MIRALAX Replaced by: polyethylene glycol 17 g packet   potassium chloride SA 20 MEQ tablet Commonly known as: KLOR-CON M       TAKE these medications    AMBULATORY NON FORMULARY MEDICATION 2 capsules in the morning and at bedtime. Medication Name: Arthroflex   AMBULATORY NON FORMULARY MEDICATION 2 capsules in the morning and at bedtime. Medication Name: ART supplement   AMBULATORY NON FORMULARY MEDICATION 2 capsules daily. Medication Name: Lyman Bishop COMPLEX   cyanocobalamin 1000 MCG tablet Take 1 tablet (1,000 mcg total) by mouth daily.   meclizine 25 MG tablet Commonly known as: ANTIVERT 1 tablet  as needed Orally every 12 hrs for 30 days As needed for dizziness   ondansetron 4 MG tablet Commonly known as: Zofran Take 1 tablet (4 mg total) by mouth every 8 (eight) hours as needed for nausea or vomiting.   pantoprazole 40 MG tablet Commonly known as: PROTONIX Take 40 mg by mouth daily.   polyethylene glycol 17 g packet Commonly known as: MIRALAX / GLYCOLAX Take 17 g by mouth daily as needed for mild constipation. Replaces: polyethylene glycol powder 17 GM/SCOOP powder   rosuvastatin 5 MG tablet Commonly known as: CRESTOR Take 5 mg by mouth  daily.   Vitamin D3 75 MCG (3000 UT) Tabs Take 3,000 Units by mouth daily.        Discharge Exam: Filed Weights   06/20/23 1400  Weight: 72.6 kg   Appear in no acute distress S1s2 RRR Lungs CTA b/l  Condition at discharge: good  The results of significant diagnostics from this hospitalization (including imaging, microbiology, ancillary and laboratory) are listed below for reference.   Imaging Studies: CT ABDOMEN PELVIS W CONTRAST Result Date: 06/20/2023 CLINICAL DATA:  Vomiting intermittently for a month. No p.o. intake in 24 hours. Abdominal pain. EXAM: CT ABDOMEN AND PELVIS WITH CONTRAST TECHNIQUE: Multidetector CT imaging of the abdomen and pelvis was performed using the standard protocol following bolus administration of intravenous contrast. RADIATION DOSE REDUCTION: This exam was performed according to the departmental dose-optimization program which includes automated exposure control, adjustment of the mA and/or kV according to patient size and/or use of iterative reconstruction technique. CONTRAST:  OMNIPAQUE IOHEXOL 300 MG/ML  SOLN COMPARISON:  Abdominal ultrasound 08/04/2022 and CT 08/04/2022 FINDINGS: Lower chest: No acute abnormality. Hepatobiliary: Hepatic steatosis. Normal gallbladder. No biliary dilation. Pancreas: Unremarkable. Spleen: Unremarkable. Adrenals/Urinary Tract: Normal adrenal glands. No urinary calculi or hydronephrosis. Bladder is unremarkable. Stomach/Bowel: Normal caliber large and small bowel. Postoperative change of right colectomy with ileocolonic anastomosis in the right upper quadrant. Colonic diverticulosis without diverticulitis. No bowel wall thickening. The appendix is normal.Stomach is within normal limits. Vascular/Lymphatic: Aortic atherosclerosis. No enlarged abdominal or pelvic lymph nodes. Reproductive: Unremarkable. Other: No free intraperitoneal fluid or air. Musculoskeletal: No acute fracture. Postoperative change about the lumbar spine  from L2-L5. Unchanged fat containing right lumbar hernia. IMPRESSION: 1. No acute abnormality in the abdomen or pelvis. 2. Hepatic steatosis. 3.  Aortic Atherosclerosis (ICD10-I70.0). Electronically Signed   By: Minerva Fester M.D.   On: 06/20/2023 20:16    Microbiology: Results for orders placed or performed during the hospital encounter of 06/20/23  Urine Culture     Status: Abnormal   Collection Time: 06/20/23  9:40 PM   Specimen: Urine, Clean Catch  Result Value Ref Range Status   Specimen Description   Final    URINE, CLEAN CATCH Performed at [facility], [address] [place], [place] [address]    Special Requests   Final    NONE Performed at [facility], [address] [place], [place] [address]    Culture 50,000 COLONIES/mL YEAST (A)  Final   Report Status 06/21/2023 FINAL  Final    Labs: CBC: Recent Labs  Lab 06/20/23 1401 06/21/23 0140 06/21/23 0527 06/22/23 0618  WBC 5.1 5.6 5.3 4.4  NEUTROABS  --   --   --  2.3  HGB 12.6 11.8* 11.1* 12.1  HCT 38.3 36.4 34.8* 36.7  MCV 95.8 96.6 97.5 96.8  PLT 158 142* 133* 125*   Basic Metabolic Panel: Recent Labs  Lab 06/20/23 1401 06/21/23 0030 06/21/23 0527 06/22/23 0618 06/23/23 1029 06/24/23 0546  NA 139  --  136 136 139 140  K 3.1*  --  3.1* 3.0* 3.2* 4.1  CL 97*  --  101 97* 102 102  CO2 25  --  26 26 27 27   GLUCOSE 101*  --  82 74 168* 98  BUN 21  --  16 18 23 21   CREATININE 0.66 0.49 0.57 0.55 0.75 0.68  CALCIUM 9.6  --  8.8* 9.3 9.3 9.7  MG  --  1.7  --   --   --   --    Liver Function Tests: Recent Labs  Lab 06/20/23 1401  AST 18  ALT 22  ALKPHOS 38  BILITOT 1.5*  PROT 7.4  ALBUMIN 4.3   CBG: No results for input(s): "GLUCAP" in the last 168 hours.  Discharge time spent: greater than 30 minutes.  Signed: Meredeth Ide, MD [facility] 06/24/2023

## 2023-06-24 NOTE — TOC Progression Note (Signed)
Transition of Care [facility]) - Progression Note    Patient Details  Name: Kathryn Romero MRN: [id number] Date of Birth: 1935-08-04  Transition of Care [facility]) CM/SW Contact  Diona Browner, [place] Phone Number: 06/24/2023, 10:40 AM  Clinical Narrative:     CSW spoke with pt and pt niece to discuss d/c plan as pt declines SNF and wants to d/c home with [facility] services through [place]. Pt remains adamant in decision. Pt niece states that she is in process of getting aid hours increases to provide additional care for pt. MD and RN notified.  Expected Discharge Plan: Skilled Nursing Facility Barriers to Discharge: Continued Medical Work up, SNF Pending bed offer  Expected Discharge Plan and Services   Discharge Planning Services: NA   Living arrangements for the past 2 months: Single Family Home                   DME Agency: NA                   Social Determinants of Health (SDOH) Interventions SDOH Screenings   Food Insecurity: No Food Insecurity (06/21/2023)  Housing: Unknown (06/21/2023)  Transportation Needs: Unmet Transportation Needs (06/21/2023)  Utilities: Not At Risk (06/21/2023)  Social Connections: Patient Declined (06/21/2023)  Tobacco Use: Low Risk  (06/20/2023)    Readmission Risk Interventions    02/22/2022    8:25 AM  Readmission Risk Prevention Plan  Post Dischage Appt Complete  Medication Screening Complete  Transportation Screening Complete

## 2023-06-24 NOTE — Plan of Care (Signed)
  Problem: Education: Goal: Knowledge of General Education information will improve Description: Including pain rating scale, medication(s)/side effects and non-pharmacologic comfort measures Outcome: Progressing   Problem: Clinical Measurements: Goal: Diagnostic test results will improve Outcome: Progressing   Problem: Pain Managment: Goal: General experience of comfort will improve and/or be controlled Outcome: Progressing   Problem: Safety: Goal: Ability to remain free from injury will improve Outcome: Progressing

## 2023-06-26 ENCOUNTER — Observation Stay (HOSPITAL_COMMUNITY)
Admission: EM | Admit: 2023-06-26 | Discharge: 2023-06-29 | Disposition: A | Discharge status: Skilled Nursing Facility | Payer: Medicare HMO | Attending: Internal Medicine | Admitting: Internal Medicine

## 2023-06-26 ENCOUNTER — Emergency Department (HOSPITAL_COMMUNITY): Payer: Medicare HMO

## 2023-06-26 ENCOUNTER — Encounter (HOSPITAL_COMMUNITY): Payer: Self-pay | Admitting: Emergency Medicine

## 2023-06-26 ENCOUNTER — Other Ambulatory Visit: Payer: Self-pay

## 2023-06-26 DIAGNOSIS — R109 Unspecified abdominal pain: Secondary | ICD-10-CM | POA: Diagnosis not present

## 2023-06-26 DIAGNOSIS — N390 Urinary tract infection, site not specified: Secondary | ICD-10-CM | POA: Diagnosis not present

## 2023-06-26 DIAGNOSIS — K219 Gastro-esophageal reflux disease without esophagitis: Secondary | ICD-10-CM | POA: Diagnosis not present

## 2023-06-26 DIAGNOSIS — R112 Nausea with vomiting, unspecified: Secondary | ICD-10-CM | POA: Diagnosis not present

## 2023-06-26 DIAGNOSIS — Z85038 Personal history of other malignant neoplasm of large intestine: Secondary | ICD-10-CM | POA: Insufficient documentation

## 2023-06-26 DIAGNOSIS — R2689 Other abnormalities of gait and mobility: Secondary | ICD-10-CM | POA: Diagnosis not present

## 2023-06-26 DIAGNOSIS — R000 Tachycardia, unspecified: Secondary | ICD-10-CM | POA: Diagnosis not present

## 2023-06-26 DIAGNOSIS — E876 Hypokalemia: Secondary | ICD-10-CM | POA: Diagnosis not present

## 2023-06-26 DIAGNOSIS — R1013 Epigastric pain: Principal | ICD-10-CM

## 2023-06-26 DIAGNOSIS — H548 Legal blindness, as defined in USA: Secondary | ICD-10-CM | POA: Diagnosis not present

## 2023-06-26 DIAGNOSIS — R0902 Hypoxemia: Secondary | ICD-10-CM | POA: Diagnosis not present

## 2023-06-26 DIAGNOSIS — K5730 Diverticulosis of large intestine without perforation or abscess without bleeding: Secondary | ICD-10-CM | POA: Diagnosis not present

## 2023-06-26 DIAGNOSIS — E669 Obesity, unspecified: Secondary | ICD-10-CM | POA: Insufficient documentation

## 2023-06-26 DIAGNOSIS — I10 Essential (primary) hypertension: Secondary | ICD-10-CM | POA: Insufficient documentation

## 2023-06-26 DIAGNOSIS — M6281 Muscle weakness (generalized): Secondary | ICD-10-CM | POA: Diagnosis not present

## 2023-06-26 DIAGNOSIS — E785 Hyperlipidemia, unspecified: Secondary | ICD-10-CM | POA: Diagnosis not present

## 2023-06-26 DIAGNOSIS — I771 Stricture of artery: Secondary | ICD-10-CM | POA: Diagnosis not present

## 2023-06-26 DIAGNOSIS — R110 Nausea: Secondary | ICD-10-CM

## 2023-06-26 DIAGNOSIS — R1084 Generalized abdominal pain: Secondary | ICD-10-CM | POA: Diagnosis not present

## 2023-06-26 DIAGNOSIS — Z6831 Body mass index (BMI) 31.0-31.9, adult: Secondary | ICD-10-CM | POA: Insufficient documentation

## 2023-06-26 DIAGNOSIS — R627 Adult failure to thrive: Secondary | ICD-10-CM | POA: Diagnosis not present

## 2023-06-26 DIAGNOSIS — N3001 Acute cystitis with hematuria: Secondary | ICD-10-CM | POA: Diagnosis not present

## 2023-06-26 DIAGNOSIS — R2681 Unsteadiness on feet: Secondary | ICD-10-CM | POA: Insufficient documentation

## 2023-06-26 DIAGNOSIS — N3000 Acute cystitis without hematuria: Secondary | ICD-10-CM | POA: Diagnosis not present

## 2023-06-26 DIAGNOSIS — R6251 Failure to thrive (child): Secondary | ICD-10-CM

## 2023-06-26 DIAGNOSIS — Z79899 Other long term (current) drug therapy: Secondary | ICD-10-CM | POA: Insufficient documentation

## 2023-06-26 DIAGNOSIS — Z9049 Acquired absence of other specified parts of digestive tract: Secondary | ICD-10-CM | POA: Diagnosis not present

## 2023-06-26 LAB — URINALYSIS, ROUTINE W REFLEX MICROSCOPIC
Bilirubin Urine: NEGATIVE
Glucose, UA: NEGATIVE mg/dL
Ketones, ur: 5 mg/dL — AB
Nitrite: NEGATIVE
Protein, ur: 30 mg/dL — AB
Specific Gravity, Urine: 1.03 (ref 1.005–1.030)
pH: 5 (ref 5.0–8.0)

## 2023-06-26 LAB — CBC WITH DIFFERENTIAL/PLATELET
Abs Immature Granulocytes: 0.02 10*3/uL (ref 0.00–0.07)
Basophils Absolute: 0 10*3/uL (ref 0.0–0.1)
Basophils Relative: 0 %
Eosinophils Absolute: 0.1 10*3/uL (ref 0.0–0.5)
Eosinophils Relative: 1 %
HCT: 38.3 % (ref 36.0–46.0)
Hemoglobin: 12.2 g/dL (ref 12.0–15.0)
Immature Granulocytes: 0 %
Lymphocytes Relative: 23 %
Lymphs Abs: 1.1 10*3/uL (ref 0.7–4.0)
MCH: 31 pg (ref 26.0–34.0)
MCHC: 31.9 g/dL (ref 30.0–36.0)
MCV: 97.2 fL (ref 80.0–100.0)
Monocytes Absolute: 0.4 10*3/uL (ref 0.1–1.0)
Monocytes Relative: 8 %
Neutro Abs: 3.1 10*3/uL (ref 1.7–7.7)
Neutrophils Relative %: 68 %
Platelets: 150 10*3/uL (ref 150–400)
RBC: 3.94 MIL/uL (ref 3.87–5.11)
RDW: 13.4 % (ref 11.5–15.5)
WBC: 4.7 10*3/uL (ref 4.0–10.5)
nRBC: 0 % (ref 0.0–0.2)

## 2023-06-26 LAB — COMPREHENSIVE METABOLIC PANEL
ALT: 39 U/L (ref 0–44)
AST: 23 U/L (ref 15–41)
Albumin: 3.8 g/dL (ref 3.5–5.0)
Alkaline Phosphatase: 35 U/L — ABNORMAL LOW (ref 38–126)
Anion gap: 10 (ref 5–15)
BUN: 28 mg/dL — ABNORMAL HIGH (ref 8–23)
CO2: 26 mmol/L (ref 22–32)
Calcium: 9.7 mg/dL (ref 8.9–10.3)
Chloride: 102 mmol/L (ref 98–111)
Creatinine, Ser: 0.89 mg/dL (ref 0.44–1.00)
GFR, Estimated: >60 mL/min (ref >60)
Glucose, Bld: 106 mg/dL — ABNORMAL HIGH (ref 70–99)
Potassium: 3.5 mmol/L (ref 3.5–5.1)
Sodium: 138 mmol/L (ref 135–145)
Total Bilirubin: 0.7 mg/dL (ref 0.0–1.2)
Total Protein: 6.4 g/dL — ABNORMAL LOW (ref 6.5–8.1)

## 2023-06-26 LAB — TROPONIN I (HIGH SENSITIVITY)
Troponin I (High Sensitivity): 7 ng/L (ref <18)
Troponin I (High Sensitivity): 9 ng/L (ref <18)

## 2023-06-26 LAB — LIPASE, BLOOD: Lipase: 34 U/L (ref 11–51)

## 2023-06-26 MED ORDER — PANTOPRAZOLE SODIUM 40 MG PO TBEC
40.0000 mg | DELAYED_RELEASE_TABLET | Freq: Every day | ORAL | Status: DC
Start: 2023-06-26 — End: 2023-06-29
  Administered 2023-06-27 – 2023-06-29 (×2): 40 mg via ORAL
  Filled 2023-06-26 (×4): qty 1

## 2023-06-26 MED ORDER — IOHEXOL 300 MG/ML  SOLN
100.0000 mL | Freq: Once | INTRAMUSCULAR | Status: AC | PRN
  Administered 2023-06-26: 100 mL via INTRAVENOUS

## 2023-06-26 MED ORDER — SODIUM CHLORIDE 0.9 % IV SOLN
1.0000 g | Freq: Once | INTRAVENOUS | Status: AC
  Administered 2023-06-26: 1 g via INTRAVENOUS
  Filled 2023-06-26: qty 10

## 2023-06-26 MED ORDER — ENOXAPARIN SODIUM 40 MG/0.4ML IJ SOSY
40.0000 mg | PREFILLED_SYRINGE | INTRAMUSCULAR | Status: DC
  Administered 2023-06-26 – 2023-06-28 (×3): 40 mg via SUBCUTANEOUS
  Filled 2023-06-26 (×3): qty 0.4

## 2023-06-26 MED ORDER — SODIUM CHLORIDE 0.9 % IV BOLUS
500.0000 mL | Freq: Once | INTRAVENOUS | Status: AC
  Administered 2023-06-26: 500 mL via INTRAVENOUS

## 2023-06-26 MED ORDER — PANTOPRAZOLE SODIUM 40 MG PO TBEC: 40.0000 mg | DELAYED_RELEASE_TABLET | Freq: Every day | ORAL | Status: DC

## 2023-06-26 MED ORDER — ONDANSETRON HCL 4 MG/2ML IJ SOLN: 4.0000 mg | Freq: Four times a day (QID) | INTRAMUSCULAR | Status: DC | PRN

## 2023-06-26 MED ORDER — ACETAMINOPHEN 325 MG PO TABS: 650.0000 mg | ORAL_TABLET | Freq: Four times a day (QID) | ORAL | Status: DC | PRN

## 2023-06-26 MED ORDER — ONDANSETRON HCL 4 MG PO TABS: 4.0000 mg | ORAL_TABLET | Freq: Four times a day (QID) | ORAL | Status: DC | PRN

## 2023-06-26 MED ORDER — SODIUM CHLORIDE 0.9 % IV BOLUS
500.0000 mL | Freq: Once | INTRAVENOUS | Status: AC
  Administered 2023-06-27: 500 mL via INTRAVENOUS

## 2023-06-26 MED ORDER — ONDANSETRON HCL 4 MG/2ML IJ SOLN
4.0000 mg | Freq: Once | INTRAMUSCULAR | Status: AC
  Administered 2023-06-26: 4 mg via INTRAVENOUS
  Filled 2023-06-26: qty 2

## 2023-06-26 MED ORDER — SENNOSIDES-DOCUSATE SODIUM 8.6-50 MG PO TABS: 1.0000 | ORAL_TABLET | Freq: Every evening | ORAL | Status: DC | PRN

## 2023-06-26 MED ORDER — ACETAMINOPHEN 650 MG RE SUPP: 650.0000 mg | Freq: Four times a day (QID) | RECTAL | Status: DC | PRN

## 2023-06-26 MED ORDER — ROSUVASTATIN CALCIUM 5 MG PO TABS
5.0000 mg | ORAL_TABLET | Freq: Every day | ORAL | Status: DC
Start: 2023-06-27 — End: 2023-06-29
  Administered 2023-06-27 – 2023-06-29 (×3): 5 mg via ORAL
  Filled 2023-06-26 (×3): qty 1

## 2023-06-26 NOTE — ED Triage Notes (Signed)
 Pt BIB by [facility] from home. Pt complains of abdominal pain and nausea for around a week. Denies vomiting and diarrhea. Pt belives it is acid reflux but home health CNA not giving her meds.   20LFA  BP 132/88 CBG 198 HR 84 O2 95 RA

## 2023-06-26 NOTE — ED Provider Notes (Signed)
 [place] EMERGENCY DEPARTMENT AT [facility] Provider Note   CSN: [id number] Arrival date & time: 06/26/23  1238     History  Chief Complaint  Patient presents with   Abdominal Pain   Nausea    Kathryn Romero is a 88 y.o. female.  Patient brought in by her home health care provider.  Patient did come by [facility].  Patient was persisting complaints of abdominal pain and nausea no vomiting or diarrhea.  But the home health caregivers concerns is that she is very weak seems to be a little bit more confused.  Definitely has not history of poor vision and not being out here very well.  Patient is a DNR.  Apparently according to the home health aide patient was offered rehab during her most recent hospitalization but refused it.  Patient was admitted February 10 through February 14.  So patient just went home.  She was brought in that time following a wellness check.  Patient had some confusion talking about her dead husband.  Has supposedly some baseline memory issues.  Supposedly had a 7 to 10-day history of nausea and vomiting.  CT done during that admission only showed a history of the right colectomy with ileocolonic anastomosis in the right upper quadrant.  That admission really was gastroenteritis multiple episodes of vomiting some hypokalemia.  Patient is blind and hard of hearing as we know.  Lives by herself.       Home Medications Prior to Admission medications   Medication Sig Start Date End Date Taking? Authorizing Provider  AMBULATORY NON FORMULARY MEDICATION 2 capsules in the morning and at bedtime. Medication Name: Arthroflex Patient not taking: Reported on 02/26/2022    [provider]  AMBULATORY NON FORMULARY MEDICATION 2 capsules in the morning and at bedtime. Medication Name: ART supplement Patient not taking: Reported on 02/26/2022    [provider]  AMBULATORY NON FORMULARY MEDICATION 2 capsules daily. Medication Name: Lyman Bishop  COMPLEX Patient not taking: Reported on 02/26/2022    [provider]  Cholecalciferol (VITAMIN D3) 3000 units TABS Take 3,000 Units by mouth daily.  Patient not taking: Reported on 02/26/2022    [provider]  cyanocobalamin 1000 MCG tablet Take 1 tablet (1,000 mcg total) by mouth daily. 06/24/23   Meredeth Ide, MD  meclizine (ANTIVERT) 25 MG tablet 1 tablet as needed Orally every 12 hrs for 30 days As needed for dizziness 05/06/22   [provider]  ondansetron (ZOFRAN) 4 MG tablet Take 1 tablet (4 mg total) by mouth every 8 (eight) hours as needed for nausea or vomiting. 05/11/23   Ward, Tylene Fantasia, PA-C  pantoprazole (PROTONIX) 40 MG tablet Take 40 mg by mouth daily. 05/24/23   [provider]  polyethylene glycol (MIRALAX / GLYCOLAX) 17 g packet Take 17 g by mouth daily as needed for mild constipation. 06/24/23   Meredeth Ide, MD  rosuvastatin (CRESTOR) 5 MG tablet Take 5 mg by mouth daily.    [provider]      Allergies    Codeine, Hydromet [hydrocodone bit-homatrop mbr], Fish allergy, Cephalexin, Morphine, and Sulfa antibiotics    Review of Systems   Review of Systems  Constitutional:  Negative for chills and fever.  HENT:  Positive for hearing loss. Negative for ear pain and sore throat.   Eyes:  Positive for visual disturbance. Negative for pain.  Respiratory:  Negative for cough and shortness of breath.   Cardiovascular:  Negative for  chest pain and palpitations.  Gastrointestinal:  Positive for abdominal pain and nausea. Negative for diarrhea and vomiting.  Genitourinary:  Negative for dysuria and hematuria.  Musculoskeletal:  Negative for arthralgias and back pain.  Skin:  Negative for color change and rash.  Neurological:  Negative for seizures and syncope.  Psychiatric/Behavioral:  Positive for confusion.   All other systems reviewed and are negative.   Physical Exam Updated Vital Signs BP 137/74   Pulse 83   Temp 99.3 F  (37.4 C) (Oral)   Resp 14   SpO2 96%  Physical Exam Vitals and nursing note reviewed.  Constitutional:      General: She is not in acute distress.    Appearance: Normal appearance. She is well-developed.  HENT:     Head: Normocephalic and atraumatic.     Mouth/Throat:     Mouth: Mucous membranes are dry.  Eyes:     Extraocular Movements: Extraocular movements intact.     Conjunctiva/sclera: Conjunctivae normal.     Pupils: Pupils are equal, round, and reactive to light.  Cardiovascular:     Rate and Rhythm: Normal rate and regular rhythm.     Heart sounds: No murmur heard. Pulmonary:     Effort: Pulmonary effort is normal. No respiratory distress.     Breath sounds: Normal breath sounds.  Abdominal:     Palpations: Abdomen is soft.     Tenderness: There is no abdominal tenderness.  Musculoskeletal:        General: No swelling.     Cervical back: Normal range of motion and neck supple.  Skin:    General: Skin is warm and dry.     Capillary Refill: Capillary refill takes less than 2 seconds.  Neurological:     General: No focal deficit present.     Mental Status: She is alert. Mental status is at baseline.  Psychiatric:        Mood and Affect: Mood normal.     ED Results / Procedures / Treatments   Labs (all labs ordered are listed, but only abnormal results are displayed) Labs Reviewed  COMPREHENSIVE METABOLIC PANEL - Abnormal; Notable for the following components:      Result Value   Glucose, Bld 106 (*)    BUN 28 (*)    Total Protein 6.4 (*)    Alkaline Phosphatase 35 (*)    All other components within normal limits  URINALYSIS, ROUTINE W REFLEX MICROSCOPIC - Abnormal; Notable for the following components:   Color, Urine AMBER (*)    APPearance HAZY (*)    Hgb urine dipstick SMALL (*)    Ketones, ur 5 (*)    Protein, ur 30 (*)    Leukocytes,Ua LARGE (*)    Bacteria, UA RARE (*)    All other components within normal limits  URINE CULTURE  CBC WITH  DIFFERENTIAL/PLATELET  LIPASE, BLOOD  TROPONIN I (HIGH SENSITIVITY)  TROPONIN I (HIGH SENSITIVITY)    EKG EKG Interpretation Date/Time:  Sunday June 26 2023 13:59:21 EST Ventricular Rate:  75 PR Interval:  181 QRS Duration:  98 QT Interval:  369 QTC Calculation: 413 R Axis:   -16  Text Interpretation: Sinus rhythm Borderline left axis deviation Low voltage, precordial leads Abnormal R-wave progression, late transition Confirmed by Vanetta Mulders [phone]) on 06/26/2023 3:23:31 PM  Radiology CT ABDOMEN PELVIS W CONTRAST Result Date: 06/26/2023 CLINICAL DATA:  Abdominal pain and nausea. EXAM: CT ABDOMEN AND PELVIS WITH CONTRAST TECHNIQUE: Multidetector CT imaging of the  abdomen and pelvis was performed using the standard protocol following bolus administration of intravenous contrast. RADIATION DOSE REDUCTION: This exam was performed according to the departmental dose-optimization program which includes automated exposure control, adjustment of the mA and/or kV according to patient size and/or use of iterative reconstruction technique. CONTRAST:  OMNIPAQUE IOHEXOL 300 MG/ML  SOLN COMPARISON:  06/20/2023 FINDINGS: Lower chest: No acute abnormality. Hepatobiliary: No focal liver abnormality is seen. No gallstones, gallbladder wall thickening, or biliary dilatation. Pancreas: Unremarkable. No pancreatic ductal dilatation or surrounding inflammatory changes. Spleen: Normal in size without focal abnormality. Adrenals/Urinary Tract: Normal adrenal glands. No nephrolithiasis, hydronephrosis, or suspicious mass. Urinary bladder appears normal for degree of distension. Stomach/Bowel: Stomach appears within normal limits. Signs of previous right hemicolectomy with coloenteric anastomosis in the right hemiabdomen. There is no pathologic dilatation of the large or small bowel loops. Left-sided colonic diverticulosis identified without signs of diverticulitis. Vascular/Lymphatic: Aortic  atherosclerosis. No signs of abdominopelvic adenopathy. Reproductive: Status post hysterectomy. No adnexal masses. Other: No abdominal wall hernia or abnormality. No abdominopelvic ascites. No signs of pneumoperitoneum. Musculoskeletal: No acute or significant osseous findings. Thoracolumbar degenerative disc disease. Postsurgical changes in the lumbar spine. No acute or suspicious osseous findings. IMPRESSION: 1. No acute findings within the abdomen or pelvis. 2. Signs of previous right hemicolectomy with coloenteric anastomosis in the right hemiabdomen. 3. Left-sided colonic diverticulosis without signs of diverticulitis. 4.  Aortic Atherosclerosis (ICD10-I70.0). Electronically Signed   By: Signa Kell M.D.   On: 06/26/2023 16:20   DG Chest Port 1 View Result Date: 06/26/2023 CLINICAL DATA:  Epigastric abdominal pain EXAM: PORTABLE CHEST 1 VIEW COMPARISON:  July 31, 2022 FINDINGS: The cardiomediastinal silhouette is unchanged in contour.Tortuous thoracic aorta. Atherosclerotic calcifications. No pleural effusion. No pneumothorax. No acute pleuroparenchymal abnormality. IMPRESSION: No acute cardiopulmonary abnormality. Electronically Signed   By: Meda Klinefelter M.D.   On: 06/26/2023 14:35    Procedures Procedures    Medications Ordered in ED Medications  cefTRIAXone (ROCEPHIN) 1 g in sodium chloride 0.9 % 100 mL IVPB (has no administration in time range)  ondansetron (ZOFRAN) injection 4 mg (has no administration in time range)  sodium chloride 0.9 % bolus 500 mL (0 mLs Intravenous Stopped 06/26/23 1518)  iohexol (OMNIPAQUE) 300 MG/ML solution 100 mL (100 mLs Intravenous Contrast Given 06/26/23 1520)    ED Course/ Medical Decision Making/ A&P                                 Medical Decision Making Amount and/or Complexity of Data Reviewed Labs: ordered. Radiology: ordered.  Risk Prescription drug management.   You are now cysts RBCs 11-20 white blood cells 21-50 bacteria rare.   Could represent urinary tract infection.  CBC no leukocytosis hemoglobin 12.2 platelets 150.  Lipase 34 complete metabolic panel glucose 106 renal function GFR is greater than 60.  LFTs without significant abnormality.  Anion gap normal.  Initial troponin was normal at 7.  CT scan abdomen and pelvis once again shows no acute findings in the abdomen or pelvis does show evidence of the right hemicolectomy with a coloenteric anastomosis in the right side of the abdomen.  Left-sided colonic diverticulosis no signs of diverticulitis.  I will send urine for culture.  And will treat as possible UTI.  Patient apparently had history of frequent UTIs.   Final Clinical Impression(s) / ED Diagnoses Final diagnoses:  Epigastric pain  Nausea  Acute cystitis  without hematuria    Rx / DC Orders ED Discharge Orders     None         Vanetta Mulders, MD 06/26/23 1651

## 2023-06-26 NOTE — H&P (Signed)
 History and Physical  Kathryn Romero ZOX:[id number] DOB: [birth year] DOA: 06/26/2023  PCP: Chilton Greathouse, MD   Chief Complaint: Abdominal pain, nausea and AMS  HPI: Kathryn Romero is a 88 y.o. 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  Patient reports that over the last week, she has had epigastric pain described as reflux as well as nausea but no vomiting. She also reports dry mouth and low energy. According to her home health aide, patient was slightly disoriented earlier today.  Patient denies any fevers, chills, dysuria, diarrhea, chest pain or shortness of breath.  She reports that she now understands that it is not safe for her to live by herself. Caretaker only comes twice a week which she does not think is enough. She is agreeable to going to a skilled nursing facility or assisted living facility at discharge but will prefer to go with her cat.   ED Course: Initial vitals stable. Labs showed normal CBC, CMP, troponin and lipase.  UA shows mild hemoglobinuria, mild ketonuria, mild proteinuria, negative nitrite, large leuks, RBC 11-20, WBC 21-50 and rare bacteria. CXR with no acute abnormalities. CT A/P with no acute findings in the abdomen or pelvis.  Patient received IV Zofran 4 mg x 1, IV NS 500 cc bolus and IV Rocephin. TRH was consulted for admission  Review of Systems: Please see HPI for pertinent positives and negatives. A complete 10 system review of systems are otherwise negative.  Past Medical History:  Diagnosis Date   ANEMIA-NOS    ANXIETY    Aortic atherosclerosis (HCC)    Arthritis    Cancer (HCC) 2005   colon cancer   Chronic back pain    spondylolisthesis   Diverticulosis    Fatty liver    GERD (gastroesophageal reflux disease)    History of  blood transfusion    no abnormal reaction noted   History of colon cancer 2004   History of shingles    HYPERLIPIDEMIA    takes Pravastatin daily   Hypertension    Legally blind    Nausea & vomiting 02/20/2022   Nocturia    RETINITIS PIGMENTOSA    SYNDROME, CARPAL TUNNEL    Weakness    numbness and tingling in legs and feet   Past Surgical History:  Procedure Laterality Date   ABDOMINAL HYSTERECTOMY  1989   ANTERIOR LAT LUMBAR FUSION Right 01/06/2018   Procedure: Right Lumbar Two-Three Anterolateral lumbar interbody fusion with lateral plate;  Surgeon: Maeola Harman, MD;  Location: [facility] OR;  Service: Neurosurgery;  Laterality: Right;  Right Lumbar Two-Three Anterolateral lumbar interbody fusion with lateral plate   APPENDECTOMY  1946   carapl tunnel release Left    cataract surgery Bilateral    COLONOSCOPY     MAXIMUM ACCESS (MAS)POSTERIOR LUMBAR INTERBODY FUSION (PLIF) 2 LEVEL N/A 11/14/2015   Procedure: Lumbar Four-Five Maximum access posterior lumbar interbody fusion, Lumbar Three-Four Lumbar Four-Five Posterolateral Fusion and Pedicle Screws;  Surgeon: Maeola Harman, MD;  Location: [facility];  Service: Neurosurgery;  Laterality: N/A;  L3-4 L4-5 Maximum access posterior lumbar interbody fusion   OOPHORECTOMY  1989   s/p ganglion cyst  1973   s/p right hemicolectomy  12 yrs ago   Social History:  reports that she has never smoked. She has never used smokeless tobacco. She reports that she  does not drink alcohol and does not use drugs.  Allergies  Allergen Reactions   Codeine Shortness Of Breath   Hydromet [Hydrocodone Bit-Homatrop Mbr] Shortness Of Breath and Other (See Comments)    Wheezing   Fish Allergy Other (See Comments)    Burning Sensation and Headache   Cephalexin Nausea Only   Morphine Other (See Comments)    "feels funny"   Sulfa Antibiotics Nausea And Vomiting         Family History  Problem Relation Age of Onset   Breast cancer Maternal Aunt    ALS Cousin     Polymyalgia rheumatica Cousin    Colon cancer Neg Hx      Prior to Admission medications   Medication Sig Start Date End Date Taking? Authorizing Provider  AMBULATORY NON FORMULARY MEDICATION 2 capsules in the morning and at bedtime. Medication Name: Arthroflex Patient not taking: Reported on 02/26/2022    [provider]  AMBULATORY NON FORMULARY MEDICATION 2 capsules in the morning and at bedtime. Medication Name: ART supplement Patient not taking: Reported on 02/26/2022    [provider]  AMBULATORY NON FORMULARY MEDICATION 2 capsules daily. Medication Name: Lyman Bishop COMPLEX Patient not taking: Reported on 02/26/2022    [provider]  Cholecalciferol (VITAMIN D3) 3000 units TABS Take 3,000 Units by mouth daily.  Patient not taking: Reported on 02/26/2022    [provider]  cyanocobalamin 1000 MCG tablet Take 1 tablet (1,000 mcg total) by mouth daily. 06/24/23   Meredeth Ide, MD  meclizine (ANTIVERT) 25 MG tablet 1 tablet as needed Orally every 12 hrs for 30 days As needed for dizziness 05/06/22   [provider]  ondansetron (ZOFRAN) 4 MG tablet Take 1 tablet (4 mg total) by mouth every 8 (eight) hours as needed for nausea or vomiting. 05/11/23   Ward, Tylene Fantasia, PA-C  pantoprazole (PROTONIX) 40 MG tablet Take 40 mg by mouth daily. 05/24/23   [provider]  polyethylene glycol (MIRALAX / GLYCOLAX) 17 g packet Take 17 g by mouth daily as needed for mild constipation. 06/24/23   Meredeth Ide, MD  rosuvastatin (CRESTOR) 5 MG tablet Take 5 mg by mouth daily.    [provider]    Physical Exam: BP 137/74   Pulse 83   Temp 99.3 F (37.4 C) (Oral)   Resp 14   SpO2 96%  General: Pleasant, well-appearing elderly blind woman laying in bed. No acute distress. Hard of hearing. HEENT: [place]/AT. Anicteric sclera. Dry mucous membrane CV: RRR. No murmurs, rubs, or gallops. No LE edema Pulmonary: Lungs CTAB. Normal effort. No  wheezing or rales. Abdominal: Soft, nontender, nondistended. Normal bowel sounds. Extremities: Palpable radial and DP pulses. Normal ROM. Skin: Warm and dry. No obvious rash or lesions. Neuro: A&Ox3. Moves all extremities. Normal sensation to light touch. No focal deficit. Psych: Normal mood and affect          Labs on Admission:  Basic Metabolic Panel: Recent Labs  Lab 06/21/23 0030 06/21/23 0527 06/22/23 0618 06/23/23 1029 06/24/23 0546 06/26/23 1406  NA  --  136 136 139 140 138  K  --  3.1* 3.0* 3.2* 4.1 3.5  CL  --  101 97* 102 102 102  CO2  --  26 26 27 27 26   GLUCOSE  --  82 74 168* 98 106*  BUN  --  16 18 23 21  28*  CREATININE 0.49 0.57 0.55 0.75 0.68 0.89  CALCIUM  --  8.8* 9.3 9.3 9.7 9.7  MG 1.7  --   --   --   --   --    Liver Function Tests: Recent Labs  Lab 06/20/23 1401 06/26/23 1406  AST 18 23  ALT 22 39  ALKPHOS 38 35*  BILITOT 1.5* 0.7  PROT 7.4 6.4*  ALBUMIN 4.3 3.8   Recent Labs  Lab 06/20/23 1401 06/26/23 1406  LIPASE 29 34   No results for input(s): "AMMONIA" in the last 168 hours. CBC: Recent Labs  Lab 06/20/23 1401 06/21/23 0140 06/21/23 0527 06/22/23 0618 06/26/23 1406  WBC 5.1 5.6 5.3 4.4 4.7  NEUTROABS  --   --   --  2.3 3.1  HGB 12.6 11.8* 11.1* 12.1 12.2  HCT 38.3 36.4 34.8* 36.7 38.3  MCV 95.8 96.6 97.5 96.8 97.2  PLT 158 142* 133* 125* 150   Cardiac Enzymes: Recent Labs  Lab 06/21/23 0030  CKTOTAL 65   BNP (last 3 results) No results for input(s): "BNP" in the last 8760 hours.  ProBNP (last 3 results) No results for input(s): "PROBNP" in the last 8760 hours.  CBG: No results for input(s): "GLUCAP" in the last 168 hours.  Radiological Exams on Admission: CT ABDOMEN PELVIS W CONTRAST Result Date: 06/26/2023 CLINICAL DATA:  Abdominal pain and nausea. EXAM: CT ABDOMEN AND PELVIS WITH CONTRAST TECHNIQUE: Multidetector CT imaging of the abdomen and pelvis was performed using the standard protocol following bolus  administration of intravenous contrast. RADIATION DOSE REDUCTION: This exam was performed according to the departmental dose-optimization program which includes automated exposure control, adjustment of the mA and/or kV according to patient size and/or use of iterative reconstruction technique. CONTRAST:  OMNIPAQUE IOHEXOL 300 MG/ML  SOLN COMPARISON:  06/20/2023 FINDINGS: Lower chest: No acute abnormality. Hepatobiliary: No focal liver abnormality is seen. No gallstones, gallbladder wall thickening, or biliary dilatation. Pancreas: Unremarkable. No pancreatic ductal dilatation or surrounding inflammatory changes. Spleen: Normal in size without focal abnormality. Adrenals/Urinary Tract: Normal adrenal glands. No nephrolithiasis, hydronephrosis, or suspicious mass. Urinary bladder appears normal for degree of distension. Stomach/Bowel: Stomach appears within normal limits. Signs of previous right hemicolectomy with coloenteric anastomosis in the right hemiabdomen. There is no pathologic dilatation of the large or small bowel loops. Left-sided colonic diverticulosis identified without signs of diverticulitis. Vascular/Lymphatic: Aortic atherosclerosis. No signs of abdominopelvic adenopathy. Reproductive: Status post hysterectomy. No adnexal masses. Other: No abdominal wall hernia or abnormality. No abdominopelvic ascites. No signs of pneumoperitoneum. Musculoskeletal: No acute or significant osseous findings. Thoracolumbar degenerative disc disease. Postsurgical changes in the lumbar spine. No acute or suspicious osseous findings. IMPRESSION: 1. No acute findings within the abdomen or pelvis. 2. Signs of previous right hemicolectomy with coloenteric anastomosis in the right hemiabdomen. 3. Left-sided colonic diverticulosis without signs of diverticulitis. 4.  Aortic Atherosclerosis (ICD10-I70.0). Electronically Signed   By: Signa Kell M.D.   On: 06/26/2023 16:20   DG Chest Port 1 View Result Date:  06/26/2023 CLINICAL DATA:  Epigastric abdominal pain EXAM: PORTABLE CHEST 1 VIEW COMPARISON:  July 31, 2022 FINDINGS: The cardiomediastinal silhouette is unchanged in contour.Tortuous thoracic aorta. Atherosclerotic calcifications. No pleural effusion. No pneumothorax. No acute pleuroparenchymal abnormality. IMPRESSION: No acute cardiopulmonary abnormality. Electronically Signed   By: Meda Klinefelter M.D.   On: 06/26/2023 14:35   Assessment/Plan Kathryn Romero is a 88 y.o. 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  # Acute cystitis Patient with history of UTIs presented with abdominal pain, nausea and slight confusion, found to have evidence of infection on urinalysis. She remains afebrile and without leukocytosis.  Mental status seems to be back to her baseline. -Continue IV Rocephin -Follow-up urine culture -Trend CBC, fever curve  # GERD # Esophagitis Has history of esophagitis and reflux on EGD. She reports having intermittent epigastric pain that sounds more like reflux symptoms.  CT A/P does not show any acute intra-abdominal pathology. -Resume Protonix 40 mg daily  # HLD -Continue rosuvastatin  # Legally blind # Generalized weakness Reports worsening weakness and low energy over the last week. Patient refused SNF during last hospitalization but now understands she is unable to live at home by herself safely.  Discussed with patient's legal guardian, Niece [name], about plans to discharge to SNF or ALF at discharge. -PT/OT eval and treat -Fall precautions  DVT prophylaxis: Lovenox     Code Status: Limited: Do not attempt resuscitation (DNR) -DNR-LIMITED -Do Not Intubate/DNI   Consults called: None  Family Communication:  Discussed admission with patient's legal guardian on the phone  Severity of Illness: The appropriate patient status for this patient is INPATIENT. Inpatient status is judged to be reasonable and necessary in order to provide the required intensity of service to ensure the patient's safety. The patient's presenting symptoms, physical exam findings, and initial radiographic and laboratory data in the context of their chronic comorbidities is felt to place them at high risk for further clinical deterioration. Furthermore, it is not anticipated that the patient will be medically stable for discharge from the hospital within 2 midnights of admission.   * I certify that at the point of admission it is my clinical judgment that the patient will require inpatient hospital care spanning beyond 2 midnights from the point of admission due to high intensity of service, high risk for further deterioration and high frequency of surveillance required.*  Level of care:  MedSurg  Steffanie Rainwater, MD 06/26/2023, 5:35 PM [facility] Pager: [phone] Isaiah 41:10   If 7PM-7AM, please contact night-coverage [email or web address] Password [email or web address]

## 2023-06-27 DIAGNOSIS — N3001 Acute cystitis with hematuria: Secondary | ICD-10-CM | POA: Diagnosis not present

## 2023-06-27 DIAGNOSIS — R6251 Failure to thrive (child): Secondary | ICD-10-CM

## 2023-06-27 LAB — CBC
HCT: 34.7 % — ABNORMAL LOW (ref 36.0–46.0)
Hemoglobin: 10.8 g/dL — ABNORMAL LOW (ref 12.0–15.0)
MCH: 31.4 pg (ref 26.0–34.0)
MCHC: 31.1 g/dL (ref 30.0–36.0)
MCV: 100.9 fL — ABNORMAL HIGH (ref 80.0–100.0)
Platelets: 130 10*3/uL — ABNORMAL LOW (ref 150–400)
RBC: 3.44 MIL/uL — ABNORMAL LOW (ref 3.87–5.11)
RDW: 13.7 % (ref 11.5–15.5)
WBC: 4.2 10*3/uL (ref 4.0–10.5)
nRBC: 0 % (ref 0.0–0.2)

## 2023-06-27 LAB — BASIC METABOLIC PANEL
Anion gap: 10 (ref 5–15)
BUN: 18 mg/dL (ref 8–23)
CO2: 25 mmol/L (ref 22–32)
Calcium: 8.9 mg/dL (ref 8.9–10.3)
Chloride: 101 mmol/L (ref 98–111)
Creatinine, Ser: 0.56 mg/dL (ref 0.44–1.00)
GFR, Estimated: >60 mL/min (ref >60)
Glucose, Bld: 105 mg/dL — ABNORMAL HIGH (ref 70–99)
Potassium: 3.1 mmol/L — ABNORMAL LOW (ref 3.5–5.1)
Sodium: 136 mmol/L (ref 135–145)

## 2023-06-27 LAB — URINE CULTURE

## 2023-06-27 MED ORDER — MAGNESIUM OXIDE -MG SUPPLEMENT 400 (240 MG) MG PO TABS
400.0000 mg | ORAL_TABLET | Freq: Two times a day (BID) | ORAL | Status: DC
  Administered 2023-06-27 – 2023-06-29 (×5): 400 mg via ORAL
  Filled 2023-06-27 (×5): qty 1

## 2023-06-27 MED ORDER — POTASSIUM CHLORIDE CRYS ER 20 MEQ PO TBCR
40.0000 meq | EXTENDED_RELEASE_TABLET | Freq: Once | ORAL | Status: AC
  Administered 2023-06-27: 40 meq via ORAL
  Filled 2023-06-27: qty 2

## 2023-06-27 MED ORDER — SODIUM CHLORIDE 0.9 % IV SOLN
1.0000 g | INTRAVENOUS | Status: DC
  Administered 2023-06-27 – 2023-06-28 (×2): 1 g via INTRAVENOUS
  Filled 2023-06-27 (×3): qty 10

## 2023-06-27 NOTE — Plan of Care (Signed)
  Problem: Nutrition: Goal: Adequate nutrition will be maintained Outcome: Adequate for Discharge   Problem: Coping: Goal: Level of anxiety will decrease Outcome: Progressing   Problem: Elimination: Goal: Will not experience complications related to urinary retention Outcome: Progressing

## 2023-06-27 NOTE — Plan of Care (Signed)
   Problem: Nutrition: Goal: Adequate nutrition will be maintained Outcome: Progressing

## 2023-06-27 NOTE — Progress Notes (Signed)
 Patient complained of feeling short of breath.  02 saturation checked at that time was 88 on room air. 2 Liters of Oxygen via [place] was applied and sats right away increased to 97% Patient remained comfortable and denied further Shortness of breath.  [place] reported above to oncoming am nurse.

## 2023-06-27 NOTE — NC FL2 (Signed)
 [place] MEDICAID FL2 LEVEL OF CARE FORM     IDENTIFICATION  Patient Name: Kathryn Romero Birthdate: Dec 16, 1935 Sex: female Admission Date (Current Location): 06/26/2023  [facility] and [place] Number:  Producer, television/film/video and Address:  [facility],  [address], [place] [address]      Provider Number: [id number]  Attending Physician Name and Address:  Joycelyn Das, MD  Relative Name and Phone Number:  Cage,Phyllis Biomedical engineer)  [phone] (Mobile)    Current Level of Care: Hospital Recommended Level of Care: Skilled Nursing Facility Prior Approval Number:    Date Approved/Denied:   PASRR Number: [id number]  Discharge Plan: SNF    Current Diagnoses: Patient Active Problem List   Diagnosis Date Noted   Failure to thrive (child) 06/27/2023   Adult wellness visit 06/20/2023   Gastroenteritis 06/20/2023   Diarrhea 02/22/2022   Abdominal pain 02/22/2022   Nausea & vomiting 02/20/2022   Hypokalemia 02/20/2022   Legal blindness 02/20/2022   Degenerative lumbar spinal stenosis 01/06/2018   Spondylolisthesis at L4-L5 level 11/14/2015   DOE (dyspnea on exertion) 10/14/2014   Generalized weakness 09/19/2014   UTI (urinary tract infection) 09/19/2014   Obese 05/20/2014   Nausea with vomiting 04/16/2009   RETINITIS PIGMENTOSA 12/04/2008   GLUCOSE INTOLERANCE 05/09/2007   Dyslipidemia 05/09/2007   ANEMIA-NOS 05/09/2007   ANXIETY 05/09/2007   Essential hypertension 05/09/2007   DIVERTICULOSIS, COLON 05/09/2007   BACK PAIN 05/09/2007   History of malignant neoplasm of large intestine 05/09/2007   ARTHRITIS, TRAUMATIC, UNSPECIFIED SITE 12/17/2006    Orientation RESPIRATION BLADDER Height & Weight     Self, Time, Situation, Place  Normal Incontinent, External catheter Weight: 76 kg Height:  5\' 1"  (154.9 cm)  BEHAVIORAL SYMPTOMS/MOOD NEUROLOGICAL BOWEL NUTRITION STATUS      Continent Diet  AMBULATORY STATUS COMMUNICATION OF NEEDS Skin    Extensive Assist Verbally Normal                       Personal Care Assistance Level of Assistance  Bathing, Feeding, Dressing Bathing Assistance: Limited assistance Feeding assistance: Limited assistance Dressing Assistance: Limited assistance     Functional Limitations Info  Sight, Hearing, Speech Sight Info: Impaired (legally blind) Hearing Info: Impaired Speech Info: Adequate    SPECIAL CARE FACTORS FREQUENCY  PT (By licensed PT), OT (By licensed OT)     PT Frequency: 5x/wk OT Frequency: 5x/wk            Contractures      Additional Factors Info  Code Status, Allergies, Psychotropic Code Status Info: DNR Allergies Info: Codeine, Hydromet (Hydrocodone Bit-homatrop Mbr), Fish Allergy, Cephalexin, Morphine, Sulfa Antibiotics Psychotropic Info: N/A         Current Medications (06/27/2023):  This is the current hospital active medication list Current Facility-Administered Medications  Medication Dose Route Frequency Provider Last Rate Last Admin   acetaminophen (TYLENOL) tablet 650 mg  650 mg Oral Q6H PRN Steffanie Rainwater, MD       Or   acetaminophen (TYLENOL) suppository 650 mg  650 mg Rectal Q6H PRN Steffanie Rainwater, MD       cefTRIAXone (ROCEPHIN) 1 g in sodium chloride 0.9 % 100 mL IVPB  1 g Intravenous Q24H Steffanie Rainwater, MD       enoxaparin (LOVENOX) injection 40 mg  40 mg Subcutaneous Q24H Steffanie Rainwater, MD   40 mg at 06/26/23 2351   magnesium oxide (MAG-OX) tablet 400 mg  400 mg Oral  BID Pokhrel, Laxman, MD   400 mg at 06/27/23 1303   ondansetron (ZOFRAN) tablet 4 mg  4 mg Oral Q6H PRN Steffanie Rainwater, MD       Or   ondansetron [facility]) injection 4 mg  4 mg Intravenous Q6H PRN Steffanie Rainwater, MD       pantoprazole (PROTONIX) EC tablet 40 mg  40 mg Oral Daily Steffanie Rainwater, MD   40 mg at 06/27/23 0950   potassium chloride SA (KLOR-CON M) CR tablet 40 mEq  40 mEq Oral Once Pokhrel, Laxman, MD       rosuvastatin (CRESTOR)  tablet 5 mg  5 mg Oral Daily Steffanie Rainwater, MD   5 mg at 06/27/23 0950   senna-docusate (Senokot-S) tablet 1 tablet  1 tablet Oral QHS PRN Steffanie Rainwater, MD         Discharge Medications: Please see discharge summary for a list of discharge medications.  Relevant Imaging Results:  Relevant Lab Results:   Additional Information SSN: [id number]  Howell Rucks, RN

## 2023-06-27 NOTE — Care Management CC44 (Signed)
 Condition Code 44 Documentation Completed  Patient Details  Name: Kathryn Romero MRN: [id number] Date of Birth: 04/10/1936   Condition Code 44 given:  Yes Patient signature on Condition Code 44 notice:  Yes Documentation of 2 MD's agreement:  Yes Code 44 added to claim:  Yes    Howell Rucks, RN 06/27/2023, 2:35 PM

## 2023-06-27 NOTE — TOC Initial Note (Addendum)
 Transition of Care [facility]) - Initial/Assessment Note    Patient Details  Name: Kathryn Romero MRN: [id number] Date of Birth: 10-19-1935  [facility]) CM/SW Contact:    Howell Rucks, RN Phone Number: 06/27/2023, 1:29 PM  Clinical Narrative: Met with pt at bedside to introduce role of TOC/NCM and review for dc planning, PT eval completed, recommendation for short term rehab/SNF, pt agreeable, no preference. Pt reports she is legally blind and is hard of hearing, reports she has a home caregiver who comes a few hours a day M-F, reports she uses a walker for functional mobility, no current home care services. NCM will update FL2 and fax out for bed offers.      -2:06pm Teams chat received from [facility] CMA "[name] [phone] asks that we call her for any needs"   2:51pm Met with pt at bedside to review Code 44/MOON, pt hard of hearing,states not fully understanding. NCM call to pt's POA, [name],  explained Code 44/MOON, verbal ok given to sign MOON, Code 44/MOON completed, copy placed at bedside.    -1:47pm Text received from [facility]), accepted bed offer at [facility], [place], admissions coordinator, notified.             Expected Discharge Plan: Skilled Nursing Facility Barriers to Discharge: Continued Medical Work up   Patient Goals and CMS Choice Patient states their goals for this hospitalization and ongoing recovery are:: return home          Expected Discharge Plan and Services       Living arrangements for the past 2 months: Single Family Home                                      Prior Living Arrangements/Services Living arrangements for the past 2 months: Single Family Home Lives with:: Self Patient language and need for interpreter reviewed:: Yes Do you feel safe going back to the place where you live?: Yes      Need for Family Participation in Patient Care: Yes (Comment) Care giver support system in place?: Yes  (comment) Current home services: Other (comment) (home caregiver services M-F) Criminal Activity/Legal Involvement Pertinent to Current Situation/Hospitalization: No - Comment as needed  Activities of Daily Living      Permission Sought/Granted                  Emotional Assessment Appearance:: Appears stated age Attitude/Demeanor/Rapport: Gracious Affect (typically observed): Accepting Orientation: : Oriented to Self, Oriented to Place, Oriented to  Time, Oriented to Situation Alcohol / Substance Use: Not Applicable Psych Involvement: No (comment)  Admission diagnosis:  Nausea [R11.0] Epigastric pain [R10.13] UTI (urinary tract infection) [N39.0] Failure to thrive (child) [R62.51] Acute cystitis without hematuria [N30.00] Patient Active Problem List   Diagnosis Date Noted   Failure to thrive (child) 06/27/2023   Adult wellness visit 06/20/2023   Gastroenteritis 06/20/2023   Diarrhea 02/22/2022   Abdominal pain 02/22/2022   Nausea & vomiting 02/20/2022   Hypokalemia 02/20/2022   Legal blindness 02/20/2022   Degenerative lumbar spinal stenosis 01/06/2018   Spondylolisthesis at L4-L5 level 11/14/2015   DOE (dyspnea on exertion) 10/14/2014   Generalized weakness 09/19/2014   UTI (urinary tract infection) 09/19/2014   Obese 05/20/2014   Nausea with vomiting 04/16/2009   RETINITIS PIGMENTOSA 12/04/2008   GLUCOSE INTOLERANCE 05/09/2007   Dyslipidemia 05/09/2007   ANEMIA-NOS 05/09/2007   ANXIETY  05/09/2007   Essential hypertension 05/09/2007   DIVERTICULOSIS, COLON 05/09/2007   BACK PAIN 05/09/2007   History of malignant neoplasm of large intestine 05/09/2007   ARTHRITIS, TRAUMATIC, UNSPECIFIED SITE 12/17/2006   PCP:  Chilton Greathouse, MD Pharmacy:   [facility] [phone] - [place], [place] - [address] AT [facility] OF [address] [place] [address] Phone: [phone] Fax: [phone]  [id number] - [place], [place] - [address] AT [facility] OF [address] [place] [address] Phone: [phone] Fax: [phone]  [id number] - [place], [place] - [address] AT [facility] [address] [place] [address] Phone: [phone] Fax: [phone]     Social Drivers of Health (SDOH) Social History: SDOH Screenings   Food Insecurity: No Food Insecurity (06/27/2023)  Housing: Low Risk  (06/27/2023)  Transportation Needs: Unmet Transportation Needs (06/27/2023)  Utilities: Not At Risk (06/27/2023)  Social Connections: Patient Declined (06/27/2023)  Tobacco Use: Low Risk  (06/26/2023)   SDOH Interventions:     Readmission Risk Interventions    06/27/2023    1:27 PM 02/22/2022    8:25 AM  Readmission Risk Prevention Plan  Post Dischage Appt Complete Complete  Medication Screening Complete Complete  Transportation Screening Complete Complete

## 2023-06-27 NOTE — Progress Notes (Signed)
 Patient stated that she doesn't remember when she last had a BM, maybe a week ago was LBM.

## 2023-06-27 NOTE — Care Management Obs Status (Signed)
 MEDICARE OBSERVATION STATUS NOTIFICATION   Patient Details  Name: Kathryn Romero MRN: [id number] Date of Birth: February 19, 1936   Medicare Observation Status Notification Given:       Howell Rucks, RN 06/27/2023, 2:35 PM

## 2023-06-27 NOTE — Evaluation (Addendum)
 Physical Therapy Evaluation Patient Details Name: Kathryn Romero MRN: [id number] DOB: Dec 18, 1935 Today's Date: 06/27/2023  History of Present Illness  88 y.o. female who presents to the ED for evaluation of abdominal discomfort, nausea and confusion. Dx of UTI. 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 Recent admission for gastroenteritis 06/20/23-06/24/23, SNF was recommended but pt declined.  Clinical Impression  Pt admitted with above diagnosis. Mod assist for bed mobility. Min assist to transfer bed to recliner with a RW. SpO2 96% on room air with activity.  Pt lives alone, has a caregiver 2 days/week. She is not safe to DC home alone due to fall risk,  24/7 assistance recommended.  Pt stated she's agreeable to going to a facility, she hopes to bring her cat. Pt currently with functional limitations due to the deficits listed below (see PT Problem List). Pt will benefit from acute skilled PT to increase their independence and safety with mobility to allow discharge.           If plan is discharge home, recommend the following: A lot of help with walking and/or transfers;A lot of help with bathing/dressing/bathroom;Assistance with cooking/housework;Assist for transportation;Help with stairs or ramp for entrance   Can travel by private vehicle   Yes    Equipment Recommendations None recommended by PT  Recommendations for Other Services       Functional Status Assessment Patient has had a recent decline in their functional status and demonstrates the ability to make significant improvements in function in a reasonable and predictable amount of time.     Precautions / Restrictions Precautions Precautions: Fall Precaution/Restrictions Comments: blind, HOH Restrictions Weight Bearing Restrictions Per Provider Order: No      Mobility  Bed  Mobility Overal bed mobility: Needs Assistance Bed Mobility: Rolling, Sidelying to Sit Rolling: Mod assist Sidelying to sit: Mod assist       General bed mobility comments: assist for technique as pt reporting her blindness is limiting    Transfers Overall transfer level: Needs assistance Equipment used: Rolling walker (2 wheels) Transfers: Sit to/from Stand, Bed to chair/wheelchair/BSC Sit to Stand: Min assist   Step pivot transfers: Min assist       General transfer comment: min A to power up and to guide pt/RW to recliner 2* visual impairment. pt only agreeable to OOB to recliner, assist for weakness (BLEs shaky in standing but no buckling nor loss of balance)    Ambulation/Gait                  Stairs            Wheelchair Mobility     Tilt Bed    Modified Rankin (Stroke Patients Only)       Balance Overall balance assessment: Needs assistance Sitting-balance support: Feet supported, No upper extremity supported Sitting balance-Leahy Scale: Fair     Standing balance support: Bilateral upper extremity supported, During functional activity, Reliant on assistive device for balance Standing balance-Leahy Scale: Poor                               Pertinent Vitals/Pain Pain Assessment Pain Assessment: No/denies pain    Home Living Family/patient expects to be discharged to:: Skilled nursing facility Living Arrangements: Alone Available Help at Discharge: Family;Available PRN/intermittently;Friend(s);Personal care attendant Type of Home: House Home Access: Stairs to enter  Entrance Stairs-Rails: Right;Left Entrance Stairs-Number of Steps: 3   Home Layout: One level Home Equipment: Rollator (4 wheels)      Prior Function Prior Level of Function : Needs assist;Patient poor historian/Family not available             Mobility Comments: uses rollator, "someone pushes me in a chair" when going out ADLs Comments: reports an aide  comes twice a week to help with bathing, other people bring groceries     Extremity/Trunk Assessment   Upper Extremity Assessment Upper Extremity Assessment: Defer to OT evaluation    Lower Extremity Assessment Lower Extremity Assessment: Generalized weakness    Cervical / Trunk Assessment Cervical / Trunk Assessment: Normal  Communication   Communication Communication: Impaired Factors Affecting Communication: Hearing impaired    Cognition Arousal: Alert Behavior During Therapy: WFL for tasks assessed/performed   PT - Cognitive impairments: Problem solving, Awareness                       PT - Cognition Comments: resistant to mobilizing (stated she can't get up because she's blind, deaf, old, cold, and her back hurts), requires encouragement to participate,   Following commands impaired: Only follows one step commands consistently     Cueing Cueing Techniques: Verbal cues, Tactile cues     General Comments      Exercises     Assessment/Plan    PT Assessment Patient needs continued PT services  PT Problem List Decreased balance;Decreased activity tolerance;Decreased mobility       PT Treatment Interventions Gait training;Functional mobility training;Therapeutic activities;Balance training;Patient/family education;Therapeutic exercise    PT Goals (Current goals can be found in the Care Plan section)  Acute Rehab PT Goals Patient Stated Goal: go to a facility where there's more help, wants to bring her cat PT Goal Formulation: With patient Time For Goal Achievement: 07/11/23 Potential to Achieve Goals: Good    Frequency Min 1X/week     Co-evaluation               AM-PAC PT "6 Clicks" Mobility  Outcome Measure Help needed turning from your back to your side while in a flat bed without using bedrails?: A Little Help needed moving from lying on your back to sitting on the side of a flat bed without using bedrails?: A Lot Help needed moving  to and from a bed to a chair (including a wheelchair)?: A Little Help needed standing up from a chair using your arms (e.g., wheelchair or bedside chair)?: A Little Help needed to walk in hospital room?: A Lot Help needed climbing 3-5 steps with a railing? : Total 6 Click Score: 14    End of Session Equipment Utilized During Treatment: Gait belt Activity Tolerance: Patient limited by fatigue Patient left: in chair;with call bell/phone within reach;with family/visitor present;with chair alarm set Nurse Communication: Mobility status PT Visit Diagnosis: Difficulty in walking, not elsewhere classified (R26.2);Muscle weakness (generalized) (M62.81)    Time: 7829-5621 PT Time Calculation (min) (ACUTE ONLY): 32 min   Charges:   PT Evaluation $PT Eval Moderate Complexity: 1 Mod PT Treatments $Therapeutic Activity: 8-22 mins PT General Charges $$ ACUTE PT VISIT: 1 Visit         Tamala Ser PT 06/27/2023  Acute Rehabilitation Services  Office [phone]

## 2023-06-27 NOTE — Progress Notes (Signed)
 PROGRESS NOTE  Kathryn Romero QMV:[id number] DOB: January 06, 1936 DOA: 06/26/2023 PCP: Chilton Greathouse, MD   LOS: 1 day   Brief narrative:   Kathryn Romero is a 88 y.o. 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  Was seen by her home health aide and presented to the ED.  In the ED, labs showed normal CBC.  UA showed 21-50 white cells.  Chest x-ray without infiltrate.  CT scan of the abdomen pelvis without any acute findings. Patient reported that it is not safe for her to live by herself. Caretaker only comes twice a week which she does not think is enough. She is agreeable to going to a skilled nursing facility or assisted living facility at discharge but will prefer to go with her cat.   Assessment/Plan: Principal Problem:   UTI (urinary tract infection) Active Problems:   Failure to thrive (child)  Acute cystitis Had  abdominal pain nausea and confusion.  UA abnormal.  No fever or leukocytosis.  Continue with Rocephin.  Follow urine cultures.  Hypokalemia.  Potassium 3.1.  Will replace orally.  Check levels in AM.  GERD CT abdomen negative.  Continue Protonix.  Hyperlipidemia. -Continue rosuvastatin   Legally blind Supportive care.  Generalized weakness Had refused  SNF during last hospitalization but now understands she is unable to live at home by herself safely.  Admitting provider had spoken with  patient's legal guardian, Niece [name], about plans to discharge to SNF or ALF at discharge.  Will get PT OT evaluation.  DVT prophylaxis: enoxaparin (LOVENOX) injection 40 mg Start: 06/26/23 2200   Disposition: Skilled nursing facility as per PT evaluation  Status is: Inpatient Remains inpatient appropriate because: IV antibiotic,  need for rehabilitation    Code Status:     Code Status: Limited: Do not attempt resuscitation (DNR) -DNR-LIMITED -Do Not Intubate/DNI   Family Communication: None at bedside  Consultants: None  Procedures: None  Anti-infectives:  Rocephin IV  Anti-infectives (From admission, onward)    Start     Dose/Rate Route Frequency Ordered Stop   06/27/23 1800  cefTRIAXone (ROCEPHIN) 1 g in sodium chloride 0.9 % 100 mL IVPB        1 g 200 mL/hr over 30 Minutes Intravenous Every 24 hours 06/27/23 0945     06/26/23 1700  cefTRIAXone (ROCEPHIN) 1 g in sodium chloride 0.9 % 100 mL IVPB        1 g 200 mL/hr over 30 Minutes Intravenous  Once 06/26/23 1650 06/26/23 1858        Subjective: Today, patient was seen and examined at bedside.  Patient states that she feels weak and tired.  Feels like she might need to move her bowels.  Denies any nausea vomiting today.  Objective: Vitals:   06/27/23 0254 06/27/23 0450  BP: (!) 127/56 (!) 142/57  Pulse: 74 68  Resp: 18 16  Temp: 97.8 F (36.6 C) 98.9 F (37.2 C)  SpO2: 92% 96%    Intake/Output Summary (Last 24 hours) at 06/27/2023 1125 Last data filed at 06/27/2023 0818 Gross per 24 hour  Intake 497.45 ml  Output 351 ml  Net 146.45 ml   Filed Weights   06/27/23 0450  Weight: 76 kg   Body mass index is 31.66 kg/m.   Physical Exam: GENERAL: Patient is alert  awake and oriented. Not in obvious distress.  Obese built HENT: No scleral pallor or icterus. Oral mucosa is moist legally blind. NECK: is supple, no gross swelling noted. CHEST: Clear to auscultation. No crackles or wheezes.  Diminished breath sounds bilaterally. CVS: S1 and S2 heard, no murmur. Regular rate and rhythm.  ABDOMEN: Soft, nonspecific tenderness on palpation, bowel sounds are present. EXTREMITIES: No edema. CNS: Legally blind, moves all extremities  SKIN: warm and dry without rashes.  Data Review: I have personally reviewed the following laboratory data  and studies,  CBC: Recent Labs  Lab 06/21/23 0140 06/21/23 0527 06/22/23 0618 06/26/23 1406 06/27/23 0303  WBC 5.6 5.3 4.4 4.7 4.2  NEUTROABS  --   --  2.3 3.1  --   HGB 11.8* 11.1* 12.1 12.2 10.8*  HCT 36.4 34.8* 36.7 38.3 34.7*  MCV 96.6 97.5 96.8 97.2 100.9*  PLT 142* 133* 125* 150 130*   Basic Metabolic Panel: Recent Labs  Lab 06/21/23 0030 06/21/23 0527 06/22/23 0618 06/23/23 1029 06/24/23 0546 06/26/23 1406 06/27/23 0303  NA  --    < > 136 139 140 138 136  K  --    < > 3.0* 3.2* 4.1 3.5 3.1*  CL  --    < > 97* 102 102 102 101  CO2  --    < > 26 27 27 26 25   GLUCOSE  --    < > 74 168* 98 106* 105*  BUN  --    < > 18 23 21  28* 18  CREATININE 0.49   < > 0.55 0.75 0.68 0.89 0.56  CALCIUM  --    < > 9.3 9.3 9.7 9.7 8.9  MG 1.7  --   --   --   --   --   --    < > = values in this interval not displayed.   Liver Function Tests: Recent Labs  Lab 06/20/23 1401 06/26/23 1406  AST 18 23  ALT 22 39  ALKPHOS 38 35*  BILITOT 1.5* 0.7  PROT 7.4 6.4*  ALBUMIN 4.3 3.8   Recent Labs  Lab 06/20/23 1401 06/26/23 1406  LIPASE 29 34   No results for input(s): "AMMONIA" in the last 168 hours. Cardiac Enzymes: Recent Labs  Lab 06/21/23 0030  CKTOTAL 65   BNP (last 3 results) No results for input(s): "BNP" in the last 8760 hours.  ProBNP (last 3 results) No results for input(s): "PROBNP" in the last 8760 hours.  CBG: No results for input(s): "GLUCAP" in the last 168 hours. Recent Results (from the past 240 hours)  Urine Culture     Status: Abnormal   Collection Time: 06/20/23  9:40 PM   Specimen: Urine, Clean Catch  Result Value Ref Range Status   Specimen Description   Final    URINE, CLEAN CATCH Performed at [facility], [address], [place], [place] [address]    Special Requests   Final    NONE Performed at [facility], [address] [place], [place] [address]    Culture 50,000 COLONIES/mL YEAST (A)   Final   Report Status 06/21/2023 FINAL  Final     Studies: CT ABDOMEN PELVIS W CONTRAST Result Date: 06/26/2023 CLINICAL DATA:  Abdominal pain and nausea. EXAM: CT ABDOMEN AND PELVIS WITH CONTRAST TECHNIQUE: Multidetector CT imaging of the abdomen and pelvis was performed using the standard protocol following bolus administration of intravenous contrast. RADIATION DOSE REDUCTION: This exam was  performed according to the departmental dose-optimization program which includes automated exposure control, adjustment of the mA and/or kV according to patient size and/or use of iterative reconstruction technique. CONTRAST:  OMNIPAQUE IOHEXOL 300 MG/ML  SOLN COMPARISON:  06/20/2023 FINDINGS: Lower chest: No acute abnormality. Hepatobiliary: No focal liver abnormality is seen. No gallstones, gallbladder wall thickening, or biliary dilatation. Pancreas: Unremarkable. No pancreatic ductal dilatation or surrounding inflammatory changes. Spleen: Normal in size without focal abnormality. Adrenals/Urinary Tract: Normal adrenal glands. No nephrolithiasis, hydronephrosis, or suspicious mass. Urinary bladder appears normal for degree of distension. Stomach/Bowel: Stomach appears within normal limits. Signs of previous right hemicolectomy with coloenteric anastomosis in the right hemiabdomen. There is no pathologic dilatation of the large or small bowel loops. Left-sided colonic diverticulosis identified without signs of diverticulitis. Vascular/Lymphatic: Aortic atherosclerosis. No signs of abdominopelvic adenopathy. Reproductive: Status post hysterectomy. No adnexal masses. Other: No abdominal wall hernia or abnormality. No abdominopelvic ascites. No signs of pneumoperitoneum. Musculoskeletal: No acute or significant osseous findings. Thoracolumbar degenerative disc disease. Postsurgical changes in the lumbar spine. No acute or suspicious osseous findings. IMPRESSION: 1. No acute findings within the abdomen or pelvis. 2.  Signs of previous right hemicolectomy with coloenteric anastomosis in the right hemiabdomen. 3. Left-sided colonic diverticulosis without signs of diverticulitis. 4.  Aortic Atherosclerosis (ICD10-I70.0). Electronically Signed   By: Signa Kell M.D.   On: 06/26/2023 16:20   DG Chest Port 1 View Result Date: 06/26/2023 CLINICAL DATA:  Epigastric abdominal pain EXAM: PORTABLE CHEST 1 VIEW COMPARISON:  July 31, 2022 FINDINGS: The cardiomediastinal silhouette is unchanged in contour.Tortuous thoracic aorta. Atherosclerotic calcifications. No pleural effusion. No pneumothorax. No acute pleuroparenchymal abnormality. IMPRESSION: No acute cardiopulmonary abnormality. Electronically Signed   By: Meda Klinefelter M.D.   On: 06/26/2023 14:35      Joycelyn Das, MD  [facility] 06/27/2023  If 7PM-7AM, please contact night-coverage

## 2023-06-27 NOTE — Evaluation (Signed)
 Occupational Therapy Evaluation Patient Details Name: Kathryn Romero MRN: [id number] DOB: [birth year] Today's Date: 06/27/2023   History of Present Illness   88 y.o. female who presents to the ED for evaluation of abdominal discomfort, nausea and confusion. Dx of UTI. 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 Recent admission for gastroenteritis 06/20/23-06/24/23, SNF was recommended but pt declined.     Clinical Impressions Pt presents with decline in function and safety with ADLs and ADL mobility with impaired strength, balance, endurance, coordination, safety awareness, vision and hearing impaired. PTA pr reports living alone with assist form an aide 2x/wk for meals and ADLs, uses a RW for mobility. Pt currently requires max A with LB ADLs, mod A with toileting tasks, sup/set up with UB ADLs and grooming tasks due to vision impairments. Pt would benefit from acute OT services to address impairments to maximize level of function and safety     If plan is discharge home, recommend the following:   A lot of help with bathing/dressing/bathroom;A little help with walking and/or transfers;Assistance with cooking/housework;Assist for transportation;Direct supervision/assist for medications management;Help with stairs or ramp for entrance;Direct supervision/assist for financial management     Functional Status Assessment   Patient has had a recent decline in their functional status and demonstrates the ability to make significant improvements in function in a reasonable and predictable amount of time.     Equipment Recommendations   None recommended by OT     Recommendations for Other Services         Precautions/Restrictions   Precautions Precautions: Fall Precaution/Restrictions Comments: blind, HOH Restrictions Weight Bearing Restrictions Per  Provider Order: No     Mobility Bed Mobility               General bed mobility comments: pt in chair upon arrival    Transfers Overall transfer level: Needs assistance Equipment used: Rolling walker (2 wheels) Transfers: Sit to/from Stand, Bed to chair/wheelchair/BSC Sit to Stand: Min assist     Step pivot transfers: Min assist     General transfer comment: min A to power up and to guide pt/RW to [facility] due to visual impairment      Balance Overall balance assessment: Needs assistance Sitting-balance support: Feet supported, No upper extremity supported Sitting balance-Leahy Scale: Fair     Standing balance support: Bilateral upper extremity supported, During functional activity, Reliant on assistive device for balance Standing balance-Leahy Scale: Poor                             ADL either performed or assessed with clinical judgement   ADL Overall ADL's : Needs assistance/impaired Eating/Feeding: Set up;Sitting   Grooming: Wash/dry hands;Wash/dry face;Set up;Supervision/safety   Upper Body Bathing: Contact guard assist   Lower Body Bathing: Maximal assistance   Upper Body Dressing : Contact guard assist   Lower Body Dressing: Maximal assistance   Toilet Transfer: Minimal assistance;Ambulation;Rolling walker (2 wheels);BSC/3in1;Cueing for safety;Cueing for sequencing   Toileting- Clothing Manipulation and Hygiene: Moderate assistance;Sit to/from stand       Functional mobility during ADLs: Minimal assistance;Rolling walker (2 wheels);Cueing for safety;Cueing for sequencing       Vision Ability to See in Adequate Light: 2 Moderately impaired Patient Visual Report: No change from baseline       Perception         Praxis  Pertinent Vitals/Pain Pain Assessment Pain Assessment: No/denies pain Faces Pain Scale: No hurt Pain Intervention(s): Monitored during session, Repositioned     Extremity/Trunk Assessment Upper  Extremity Assessment Upper Extremity Assessment: Generalized weakness   Lower Extremity Assessment Lower Extremity Assessment: Defer to PT evaluation   Cervical / Trunk Assessment Cervical / Trunk Assessment: Normal   Communication Communication Communication: Impaired Factors Affecting Communication: Hearing impaired   Cognition Arousal: Alert Behavior During Therapy: WFL for tasks assessed/performed Cognition: No family/caregiver present to determine baseline             OT - Cognition Comments: poor insight into deficits                 Following commands: Intact Following commands impaired: Only follows one step commands consistently     Cueing  General Comments   Cueing Techniques: Verbal cues;Tactile cues      Exercises     Shoulder Instructions      Home Living Family/patient expects to be discharged to:: Skilled nursing facility Living Arrangements: Alone Available Help at Discharge: Family;Available PRN/intermittently;Friend(s);Personal care attendant Type of Home: House Home Access: Stairs to enter [employer] of Steps: 3 Entrance Stairs-Rails: Right;Left Home Layout: One level     Bathroom Shower/Tub: Chief Strategy Officer: Standard     Home Equipment: Rollator (4 wheels)          Prior Functioning/Environment Prior Level of Function : Needs assist;Patient poor historian/Family not available             Mobility Comments: uses rollator, "someone pushes me in a chair" when going out ADLs Comments: reports an aide comes twice a week to help with bathing, other people bring groceries.    OT Problem List: Decreased strength;Impaired balance (sitting and/or standing);Decreased safety awareness;Decreased activity tolerance;Decreased knowledge of use of DME or AE;Decreased coordination   OT Treatment/Interventions: Self-care/ADL training;DME and/or AE instruction;Therapeutic activities;Balance  training;Therapeutic exercise;Patient/family education      OT Goals(Current goals can be found in the care plan section)   Acute Rehab OT Goals Patient Stated Goal: get well and go home OT Goal Formulation: With patient Time For Goal Achievement: 07/11/23 Potential to Achieve Goals: Good ADL Goals Pt Will Perform Grooming: with set-up Pt Will Perform Upper Body Bathing: with set-up Pt Will Perform Lower Body Bathing: with mod assist Pt Will Perform Upper Body Dressing: with set-up Pt Will Perform Lower Body Dressing: with mod assist Pt Will Transfer to Toilet: with contact guard assist;ambulating Pt Will Perform Toileting - Clothing Manipulation and hygiene: with min assist;sit to/from stand   OT Frequency:  Min 1X/week    Co-evaluation              AM-PAC OT "6 Clicks" Daily Activity     Outcome Measure Help from another person eating meals?: A Little Help from another person taking care of personal grooming?: A Little Help from another person toileting, which includes using toliet, bedpan, or urinal?: A Lot Help from another person bathing (including washing, rinsing, drying)?: A Lot Help from another person to put on and taking off regular upper body clothing?: A Little Help from another person to put on and taking off regular lower body clothing?: A Lot 6 Click Score: 15   End of Session Equipment Utilized During Treatment: Gait belt;Rolling walker (2 wheels);Other (comment) (BSC)  Activity Tolerance: Patient tolerated treatment well Patient left: in chair;with call bell/phone within reach;with chair alarm set  OT Visit Diagnosis: Unsteadiness on  feet (R26.81);Other abnormalities of gait and mobility (R26.89);Muscle weakness (generalized) (M62.81)                Time: 5784-6962 OT Time Calculation (min): 41 min Charges:  OT General Charges $OT Visit: 1 Visit OT Evaluation $OT Eval Moderate Complexity: 1 Mod OT Treatments $Self Care/Home Management : 8-22  mins    Margaretmary Eddy [facility] 06/27/2023, 1:29 PM

## 2023-06-28 DIAGNOSIS — N3001 Acute cystitis with hematuria: Secondary | ICD-10-CM | POA: Diagnosis not present

## 2023-06-28 LAB — CBC
HCT: 35.9 % — ABNORMAL LOW (ref 36.0–46.0)
Hemoglobin: 11.3 g/dL — ABNORMAL LOW (ref 12.0–15.0)
MCH: 31.4 pg (ref 26.0–34.0)
MCHC: 31.5 g/dL (ref 30.0–36.0)
MCV: 99.7 fL (ref 80.0–100.0)
Platelets: 127 10*3/uL — ABNORMAL LOW (ref 150–400)
RBC: 3.6 MIL/uL — ABNORMAL LOW (ref 3.87–5.11)
RDW: 13.7 % (ref 11.5–15.5)
WBC: 3.9 10*3/uL — ABNORMAL LOW (ref 4.0–10.5)
nRBC: 0 % (ref 0.0–0.2)

## 2023-06-28 LAB — BASIC METABOLIC PANEL
Anion gap: 7 (ref 5–15)
BUN: 13 mg/dL (ref 8–23)
CO2: 26 mmol/L (ref 22–32)
Calcium: 8.9 mg/dL (ref 8.9–10.3)
Chloride: 105 mmol/L (ref 98–111)
Creatinine, Ser: 0.52 mg/dL (ref 0.44–1.00)
GFR, Estimated: >60 mL/min (ref >60)
Glucose, Bld: 93 mg/dL (ref 70–99)
Potassium: 3.7 mmol/L (ref 3.5–5.1)
Sodium: 138 mmol/L (ref 135–145)

## 2023-06-28 LAB — MAGNESIUM: Magnesium: 2.1 mg/dL (ref 1.7–2.4)

## 2023-06-28 NOTE — TOC Progression Note (Addendum)
 Transition of Care [facility]) - Progression Note    Patient Details  Name: Kathryn Romero MRN: [id number] Date of Birth: October 30, 1935  Transition of Care [facility]) CM/SW Contact  Howell Rucks, RN Phone Number: 06/28/2023, 12:02 PM  Clinical Narrative:  Short term rehab/SNF bed offers emailed to Dorene Grebe (pt's POA) at [email or web address] per [name]'s request.  [facility], [place], [facility], [place], [place], [place], [place]), await facility choice.  -2:07pm Text received from [place], accepted bed offer at [facility]. [facility] Medicare insurance auth initiated. [facility] " [place] [id number] SNF, days approved. 06/28/2023-06/30/2023 Approved    Expected Discharge Plan: Skilled Nursing Facility Barriers to Discharge: Continued Medical Work up  Expected Discharge Plan and Services       Living arrangements for the past 2 months: Single Family Home                                       Social Determinants of Health (SDOH) Interventions SDOH Screenings   Food Insecurity: No Food Insecurity (06/27/2023)  Housing: Low Risk  (06/27/2023)  Transportation Needs: Unmet Transportation Needs (06/27/2023)  Utilities: Not At Risk (06/27/2023)  Social Connections: Patient Declined (06/27/2023)  Tobacco Use: Low Risk  (06/26/2023)    Readmission Risk Interventions    06/27/2023    1:27 PM 02/22/2022    8:25 AM  Readmission Risk Prevention Plan  Post Dischage Appt Complete Complete  Medication Screening Complete Complete  Transportation Screening Complete Complete

## 2023-06-28 NOTE — Progress Notes (Signed)
 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 She was seen by her home health aide and sent to the ED. In the ED: labs normal CBC.  UA showed 21-50 white cells.  CXR>without infiltrate. CT abdomen pelvis with contrast 2/17 -no acute findings, signs of previous right hemicolectomy with coloenteric anastomosis in the right hemiabdomen w/ left-sided colonic diverticulosis without signs of diverticulitis. Patient reported that it is not safe for her to live by herself. Caretaker only comes twice a week which she does not think is enough. She is agreeable to going to a skilled nursing facility or assisted living facility at discharge but will prefer to go with her cat.  Patient was admitted for acute cystitis deconditioning debility.   Subjective: Seen examined this morning seen resting comfortably no complaints  Overnight afebrile BP stable Labs with normal BUN, CBC with mild leukopenia anemia and thrombocytopenia Urine cultures showed multiple species.   Assessment and Plan: Principal Problem:   UTI (urinary tract infection) Active Problems:   Failure to thrive (child)   Acute cystitis Urine culture with multiple species.  UA grossly abnormal consistent with UTI, completed short course of antibiotics.     Hypokalemia: Resolved.   GERD Continue PPI    Hld: Continue atorvastatin.     Legally blind Cont supportive care.   Generalized weakness Debility/deconditioning: Patient had diffuse disease during last hospitalization but at this time unable to live by herself unable to take care of herself safely.  During admission patient's legal  guardian, Niece Kathryn Romero was discussed and plan is for discharge to a SNF or ALF  Class I Obesity: Patient's Body mass index is 31.66 kg/m. : Will benefit with PCP follow-up, weight loss  healthy lifestyle and outpatient sleep evaluation.   DVT prophylaxis: enoxaparin (LOVENOX) injection 40 mg Start: 06/26/23 2200 Code Status:   Code Status: Limited: Do not attempt resuscitation (DNR) -DNR-LIMITED -Do Not Intubate/DNI  Family Communication: plan of care discussed with patien at bedside. Patient status is: Remains hospitalized because of severity of illness Level of care: Med-Surg   Dispo: The patient is from: [facility] ALONE             Anticipated disposition: SNf once bed available.  She is medically stable Objective: Vitals last 24 hrs: Vitals:   06/27/23 0815 06/27/23 1553 06/27/23 1947 06/28/23 0655  BP:  124/68 (!) 151/65 (!) 151/65  Pulse:  78 69 69  Resp:  18 16 17   Temp:  98.4 F (36.9 C) 97.9 F (36.6 C) 97.9 F (36.6 C)  TempSrc:      SpO2:  93% 95% 95%  Weight:      Height: 5\' 1"  (1.549 m)      Weight change:   Physical Examination: General exam: alert awake,at baseline, older than stated age HEENT:Oral mucosa moist, Ear/Nose WNL grossly Respiratory system: Bilaterally clear BS,no use of accessory muscle Cardiovascular system: S1 & S2 +, No JVD. Gastrointestinal system: Abdomen soft,NT,ND, BS+ Nervous System: Alert, awake, moving all extremities,and following commands. Extremities: LE edema neg,distal peripheral pulses palpable and warm.  Skin: No rashes,no icterus. MSK: Normal muscle bulk,tone, power   Medications reviewed:  Scheduled Meds:  enoxaparin (LOVENOX) injection  40 mg Subcutaneous Q24H  magnesium oxide  400 mg Oral BID   pantoprazole  40 mg Oral Daily   rosuvastatin  5 mg Oral Daily   Continuous Infusions:  cefTRIAXone (ROCEPHIN)  IV 1 g (06/27/23 1711)      Diet Order             Diet regular Room service appropriate? Yes; Fluid  consistency: Thin  Diet effective now                  Intake/Output Summary (Last 24 hours) at 06/28/2023 1223 Last data filed at 06/28/2023 1000 Gross per 24 hour  Intake 220 ml  Output 1050 ml  Net -830 ml   Net IO Since Admission: -833.55 mL [06/28/23 1223]  Wt Readings from Last 3 Encounters:  06/27/23 76 kg  06/20/23 72.6 kg  08/04/22 72.6 kg     Unresulted Labs (From admission, onward)    None     Data Reviewed: I have personally reviewed following labs and imaging studies CBC: Recent Labs  Lab 06/22/23 0618 06/26/23 1406 06/27/23 0303 06/28/23 0359  WBC 4.4 4.7 4.2 3.9*  NEUTROABS 2.3 3.1  --   --   HGB 12.1 12.2 10.8* 11.3*  HCT 36.7 38.3 34.7* 35.9*  MCV 96.8 97.2 100.9* 99.7  PLT 125* 150 130* 127*   Basic Metabolic Panel:  Recent Labs  Lab 06/23/23 1029 06/24/23 0546 06/26/23 1406 06/27/23 0303 06/28/23 0359  NA 139 140 138 136 138  K 3.2* 4.1 3.5 3.1* 3.7  CL 102 102 102 101 105  CO2 27 27 26 25 26   GLUCOSE 168* 98 106* 105* 93  BUN 23 21 28* 18 13  CREATININE 0.75 0.68 0.89 0.56 0.52  CALCIUM 9.3 9.7 9.7 8.9 8.9  MG  --   --   --   --  2.1   GFR: Estimated Creatinine Clearance: 48 mL/min (by C-G formula based on SCr of 0.52 mg/dL). Liver Function Tests:  Recent Labs  Lab 06/26/23 1406  AST 23  ALT 39  ALKPHOS 35*  BILITOT 0.7  PROT 6.4*  ALBUMIN 3.8   Recent Labs  Lab 06/26/23 1406  LIPASE 34   No results for input(s): "AMMONIA" in the last 168 hours. Sepsis Labs: No results for input(s): "PROCALCITON", "LATICACIDVEN" in the last 168 hours. Recent Results (from the past 240 hours)  Urine Culture     Status: Abnormal   Collection Time: 06/20/23  9:40 PM   Specimen: Urine, Clean Catch  Result Value Ref Range Status   Specimen Description   Final    URINE, CLEAN CATCH Performed at [facility], [address], [place], [place] [address]    Special Requests   Final    NONE Performed at [facility], [address], [place], [place] [address]    Culture 50,000 COLONIES/mL YEAST (A)  Final   Report Status 06/21/2023 FINAL  Final  Urine Culture     Status: Abnormal   Collection Time: 06/26/23  3:06 PM   Specimen: Urine, Clean Catch  Result Value Ref Range Status   Specimen Description   Final    URINE, CLEAN CATCH Performed at [facility], [address] [place], [place] [address]    Special Requests   Final    NONE Performed at [facility], [address], [place], [place] [address]    Culture MULTIPLE SPECIES PRESENT, SUGGEST RECOLLECTION (A)  Final   Report Status  06/27/2023 FINAL  Final    Antimicrobials/Microbiology: Anti-infectives (From admission, onward)    Start     Dose/Rate Route Frequency Ordered Stop   06/27/23 1800  cefTRIAXone (ROCEPHIN) 1 g in sodium chloride 0.9 % 100 mL IVPB        1 g 200 mL/hr over 30 Minutes Intravenous Every 24 hours 06/27/23 0945     06/26/23 1700  cefTRIAXone (ROCEPHIN) 1 g in sodium chloride 0.9 % 100 mL IVPB        1 g 200 mL/hr over 30 Minutes Intravenous  Once 06/26/23 1650 06/26/23 1858         Component Value Date/Time   SDES  06/26/2023 1506    URINE, CLEAN CATCH Performed at [facility], [address] [place], [place] [address]    SPECREQUEST  06/26/2023 1506    NONE Performed at [facility], [address], [place], [place] [address]    CULT MULTIPLE SPECIES PRESENT, SUGGEST RECOLLECTION (A) 06/26/2023 1506   REPTSTATUS 06/27/2023 FINAL 06/26/2023 1506     Radiology Studies: CT ABDOMEN PELVIS W CONTRAST Result Date: 06/26/2023 CLINICAL DATA:  Abdominal pain and nausea. EXAM: CT ABDOMEN AND PELVIS WITH CONTRAST TECHNIQUE: Multidetector CT imaging of the abdomen and pelvis was performed using the standard protocol following bolus administration of intravenous contrast. RADIATION DOSE REDUCTION: This exam was performed  according to the departmental dose-optimization program which includes automated exposure control, adjustment of the mA and/or kV according to patient size and/or use of iterative reconstruction technique. CONTRAST:  OMNIPAQUE IOHEXOL 300 MG/ML  SOLN COMPARISON:  06/20/2023 FINDINGS: Lower chest: No acute abnormality. Hepatobiliary: No focal liver abnormality is seen. No gallstones, gallbladder wall thickening, or biliary dilatation. Pancreas: Unremarkable. No pancreatic ductal dilatation or surrounding inflammatory changes. Spleen: Normal in size without focal abnormality. Adrenals/Urinary Tract: Normal adrenal glands. No nephrolithiasis, hydronephrosis, or suspicious mass. Urinary bladder appears normal for degree of distension. Stomach/Bowel: Stomach appears within normal limits. Signs of previous right hemicolectomy with coloenteric anastomosis in the right hemiabdomen. There is no pathologic dilatation of the large or small bowel loops. Left-sided colonic diverticulosis identified without signs of diverticulitis. Vascular/Lymphatic: Aortic atherosclerosis. No signs of abdominopelvic adenopathy. Reproductive: Status post hysterectomy. No adnexal masses. Other: No abdominal wall hernia or abnormality. No abdominopelvic ascites. No signs of pneumoperitoneum. Musculoskeletal: No acute or significant osseous findings. Thoracolumbar degenerative disc disease. Postsurgical changes in the lumbar spine. No acute or suspicious osseous findings. IMPRESSION: 1. No acute findings within the abdomen or pelvis. 2. Signs of previous right hemicolectomy with coloenteric anastomosis in the right hemiabdomen. 3. Left-sided colonic diverticulosis without signs of diverticulitis. 4.  Aortic Atherosclerosis (ICD10-I70.0). Electronically Signed   By: Signa Kell M.D.   On: 06/26/2023 16:20   DG Chest Port 1 View Result Date: 06/26/2023 CLINICAL DATA:  Epigastric abdominal pain EXAM: PORTABLE CHEST 1 VIEW COMPARISON:   July 31, 2022 FINDINGS: The cardiomediastinal silhouette is unchanged in contour.Tortuous thoracic aorta. Atherosclerotic calcifications. No pleural effusion. No pneumothorax. No acute pleuroparenchymal abnormality. IMPRESSION: No acute cardiopulmonary abnormality. Electronically Signed   By: Meda Klinefelter M.D.   On: 06/26/2023 14:35     LOS: 1 day   Total time spent in review of labs and imaging, patient evaluation, formulation of plan, documentation and communication with family: 35 minutes  Lanae Boast, MD  [facility]  06/28/2023, 12:23 PM

## 2023-06-28 NOTE — Plan of Care (Signed)

## 2023-06-29 DIAGNOSIS — I10 Essential (primary) hypertension: Secondary | ICD-10-CM | POA: Diagnosis not present

## 2023-06-29 DIAGNOSIS — M6281 Muscle weakness (generalized): Secondary | ICD-10-CM | POA: Diagnosis not present

## 2023-06-29 DIAGNOSIS — N3090 Cystitis, unspecified without hematuria: Secondary | ICD-10-CM | POA: Diagnosis not present

## 2023-06-29 DIAGNOSIS — K5900 Constipation, unspecified: Secondary | ICD-10-CM | POA: Diagnosis not present

## 2023-06-29 DIAGNOSIS — M2550 Pain in unspecified joint: Secondary | ICD-10-CM | POA: Diagnosis not present

## 2023-06-29 DIAGNOSIS — K219 Gastro-esophageal reflux disease without esophagitis: Secondary | ICD-10-CM | POA: Diagnosis not present

## 2023-06-29 DIAGNOSIS — L309 Dermatitis, unspecified: Secondary | ICD-10-CM | POA: Diagnosis not present

## 2023-06-29 DIAGNOSIS — Z6831 Body mass index (BMI) 31.0-31.9, adult: Secondary | ICD-10-CM | POA: Diagnosis not present

## 2023-06-29 DIAGNOSIS — K130 Diseases of lips: Secondary | ICD-10-CM | POA: Diagnosis not present

## 2023-06-29 DIAGNOSIS — N390 Urinary tract infection, site not specified: Secondary | ICD-10-CM | POA: Diagnosis not present

## 2023-06-29 DIAGNOSIS — E785 Hyperlipidemia, unspecified: Secondary | ICD-10-CM | POA: Diagnosis not present

## 2023-06-29 DIAGNOSIS — R531 Weakness: Secondary | ICD-10-CM | POA: Diagnosis not present

## 2023-06-29 DIAGNOSIS — R1110 Vomiting, unspecified: Secondary | ICD-10-CM | POA: Diagnosis not present

## 2023-06-29 DIAGNOSIS — R110 Nausea: Secondary | ICD-10-CM | POA: Diagnosis not present

## 2023-06-29 DIAGNOSIS — N189 Chronic kidney disease, unspecified: Secondary | ICD-10-CM | POA: Diagnosis not present

## 2023-06-29 DIAGNOSIS — N3001 Acute cystitis with hematuria: Secondary | ICD-10-CM | POA: Diagnosis not present

## 2023-06-29 DIAGNOSIS — R2689 Other abnormalities of gait and mobility: Secondary | ICD-10-CM | POA: Diagnosis not present

## 2023-06-29 DIAGNOSIS — Z7401 Bed confinement status: Secondary | ICD-10-CM | POA: Diagnosis not present

## 2023-06-29 DIAGNOSIS — I1310 Hypertensive heart and chronic kidney disease without heart failure, with stage 1 through stage 4 chronic kidney disease, or unspecified chronic kidney disease: Secondary | ICD-10-CM | POA: Diagnosis not present

## 2023-06-29 DIAGNOSIS — R2681 Unsteadiness on feet: Secondary | ICD-10-CM | POA: Diagnosis not present

## 2023-06-29 DIAGNOSIS — E669 Obesity, unspecified: Secondary | ICD-10-CM | POA: Diagnosis not present

## 2023-06-29 DIAGNOSIS — Z7409 Other reduced mobility: Secondary | ICD-10-CM | POA: Diagnosis not present

## 2023-06-29 DIAGNOSIS — H548 Legal blindness, as defined in USA: Secondary | ICD-10-CM | POA: Diagnosis not present

## 2023-06-29 MED ORDER — MAGNESIUM OXIDE -MG SUPPLEMENT 400 (240 MG) MG PO TABS: 400.0000 mg | ORAL_TABLET | Freq: Two times a day (BID) | ORAL | Status: AC

## 2023-06-29 NOTE — Plan of Care (Signed)
   Problem: Coping: Goal: Level of anxiety will decrease Outcome: Progressing   Problem: Pain Managment: Goal: General experience of comfort will improve and/or be controlled Outcome: Progressing   Problem: Safety: Goal: Ability to remain free from injury will improve Outcome: Progressing

## 2023-06-29 NOTE — TOC Transition Note (Signed)
 Transition of Care [facility]) - Discharge Note   Patient Details  Name: Kathryn Romero MRN: [id number] Date of Birth: Dec 24, 1935  [facility] CM/SW Contact:  Howell Rucks, RN Phone Number: 06/29/2023, 11:21 AM   Clinical Narrative:   DC to SNF/ [facility], [address], Call report [phone]. , [name] (POA) (Niece) notified. PTAR for transport. No further TOC needs identified.     Final next level of care: Skilled Nursing Facility Barriers to Discharge: Barriers Resolved   Patient Goals and CMS Choice Patient states their goals for this hospitalization and ongoing recovery are:: return home CMS Medicare.gov Compare Post Acute Care list provided to:: Patient Represenative (must comment) Dorene Grebe POA [phone]") Choice offered to / list presented to : [facility] POA / Guardian Dorene Grebe POA [phone]") [place] ownership interest in [facility].provided to:: [facility] POA / Judeen Hammans POA [phone]")    Discharge Placement              Patient [facility] bed at: [facility] Patient to be transferred to facility by: PTAR Name of family member notified: "[name] [phone]" Patient and family notified of of transfer: 06/29/23  Discharge Plan and Services Additional resources added to the After Visit Summary for                                       Social Drivers of Health (SDOH) Interventions SDOH Screenings   Food Insecurity: No Food Insecurity (06/27/2023)  Housing: Low Risk  (06/27/2023)  Transportation Needs: Unmet Transportation Needs (06/27/2023)  Utilities: Not At Risk (06/27/2023)  Social Connections: Patient Declined (06/27/2023)  Tobacco Use: Low Risk  (06/26/2023)     Readmission Risk Interventions    06/29/2023   11:19 AM 06/27/2023    1:27 PM 02/22/2022    8:25 AM  Readmission Risk Prevention Plan  Post Dischage Appt Complete Complete Complete  Medication Screening Complete Complete Complete   Transportation Screening Complete Complete Complete

## 2023-06-29 NOTE — Plan of Care (Signed)
  Problem: Pain Managment: Goal: General experience of comfort will improve and/or be controlled 06/29/2023 0436 by Kizzie Bane, RN Outcome: Progressing 06/29/2023 0434 by Kizzie Bane, RN Outcome: Progressing   Problem: Safety: Goal: Ability to remain free from injury will improve 06/29/2023 0436 by Kizzie Bane, RN Outcome: Progressing 06/29/2023 0434 by Kizzie Bane, RN Outcome: Progressing   Problem: Coping: Goal: Level of anxiety will decrease 06/29/2023 0436 by Kizzie Bane, RN Outcome: Progressing 06/29/2023 0434 by Kizzie Bane, RN Outcome: Progressing

## 2023-06-29 NOTE — Progress Notes (Signed)
 Attempted to call report to [facility]-no answer, unable to leave message. Sent report sheet with my contact information to follow up if facility needs.

## 2023-06-29 NOTE — Discharge Summary (Signed)
 Physician Discharge Summary  Kathryn Romero QMV:[id number] DOB: 10/09/1935 DOA: 06/26/2023  PCP: Chilton Greathouse, MD  Admit date: 06/26/2023 Discharge date: 06/29/2023 Recommendations for Outpatient Follow-up:  Follow up with PCP in 1 weeks-call for appointment Please obtain BMP/CBC in one week  Discharge Dispo: SNF Discharge Condition: Stable Code Status:   Code Status: Limited: Do not attempt resuscitation (DNR) -DNR-LIMITED -Do Not Intubate/DNI  Diet recommendation:  Diet Order             Diet regular Room service appropriate? 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 She was seen by her home health aide and sent to the ED. In the ED: labs normal CBC.  UA showed 21-50 white cells.  CXR>without infiltrate. CT abdomen pelvis with contrast 2/17 -no acute findings, signs of previous right hemicolectomy with coloenteric anastomosis in the right hemiabdomen w/ left-sided colonic diverticulosis without signs of diverticulitis. Patient reported that it is not safe for her to live by herself. Caretaker only comes twice a week which she does not think is enough. She is agreeable to going to a skilled nursing facility or assisted living facility at discharge but will prefer to go with her cat.  Patient was admitted for acute cystitis deconditioning debility.  At this time urine clinically stable labs improved.  Urine culture with mixed organism-treated with ceftriaxone from 2/16 and will complete the course  2/19.  She has a skilled nursing facility bed available for discharge on 06/29/2023.    Discharge Diagnoses:  Principal Problem:   UTI (urinary tract infection) Active  Problems:   Failure to thrive (child)  Acute cystitis Urine culture with multiple species.  UA grossly abnormal consistent with UTI, completed short course of antibiotics. inpatient     Hypokalemia: Resolved.   GERD Continue PPI    Hld: Continue atorvastatin.     Legally blind Cont supportive care.   Generalized weakness Debility/deconditioning: Patient had diffuse disease during last hospitalization but at this time unable to live by herself unable to take care of herself safely.  During admission patient's legal guardian, Niece [name] was discussed and plan is for discharge to a SNF and has a bed available 2/19  Class I Obesity: Patient's Body mass index is 31.66 kg/m. : Will benefit with PCP follow-up, weight loss  healthy lifestyle and outpatient sleep evaluation.  Consults: none Subjective: Aaox3 Doing well eager  to go tO SNF  Discharge Exam: Vitals:   06/28/23 2159 06/29/23 0618  BP: (!) 119/58 (!) 138/56  Pulse: 71 73  Resp: 18 17  Temp: 98.8 F (37.1 C) 97.8 F (36.6 C)  SpO2: 95% 98%   General: Pt is alert, awake, not in acute distress Cardiovascular: RRR, S1/S2 +, no rubs, no gallops Respiratory: CTA bilaterally, no wheezing, no rhonchi Abdominal: Soft, NT, ND, bowel sounds + Extremities: no edema, no cyanosis  Discharge Instructions  Discharge Instructions     Discharge instructions   Complete by: As directed    Please call call MD or return to ER for similar or worsening recurring problem that brought you to hospital or if any fever,nausea/vomiting,abdominal pain, uncontrolled pain, chest pain,  shortness of breath  or any other alarming symptoms.  Please follow-up your doctor as instructed in a week time and call the office for appointment.  Please avoid alcohol, smoking, or any other illicit substance and maintain healthy habits including taking your regular medications as prescribed.  You were cared for by a hospitalist during your  hospital stay. If you have any questions about your discharge medications or the care you received while you were in the hospital after you are discharged, you can call the unit and ask to speak with the hospitalist on call if the hospitalist that took care of you is not available.  Once you are discharged, your primary care physician will handle any further medical issues. Please note that NO REFILLS for any discharge medications will be authorized once you are discharged, as it is imperative that you return to your primary care physician (or establish a relationship with a primary care physician if you do not have one) for your aftercare needs so that they can reassess your need for medications and monitor your lab values   Increase activity slowly   Complete by: As directed       Allergies as of 06/29/2023       Reactions   Codeine Shortness Of Breath   Hydromet [hydrocodone Bit-homatrop Mbr] Shortness Of Breath, Other (See Comments)   Wheezing   Fish Allergy Other (See Comments)   Burning Sensation and Headache   Cephalexin Nausea Only   Morphine Other (See Comments)   "feels funny"   Sulfa Antibiotics Nausea And Vomiting           Medication List     TAKE these medications    magnesium oxide 400 (240 Mg) MG tablet Commonly known as: MAG-OX Take 1 tablet (400 mg total) by mouth 2 (two) times daily for 2 days.   ondansetron 4 MG tablet Commonly known as: Zofran Take 1 tablet (4 mg total) by mouth every 8 (eight) hours as needed for nausea or vomiting.   OVER THE COUNTER MEDICATION Take 2 tablets by mouth at bedtime. OTC pain med   pantoprazole 40 MG tablet Commonly known as: PROTONIX Take 40 mg by mouth daily.   psyllium 95 % Pack Commonly known as: HYDROCIL/METAMUCIL Take 1 packet by mouth daily.   rosuvastatin 5 MG tablet Commonly known as: CRESTOR Take 5 mg by mouth daily.        Contact information for follow-up providers     [name], [name], MD Follow  up in 1 week(s).   Specialty: Internal Medicine Contact information: [address] [place] [address] [phone]              Contact information for after-discharge care     Destination     [facility] Preferred SNF .   Service: Skilled Nursing Contact information: [address] [place] [address] [phone]                    Allergies  Allergen Reactions   Codeine Shortness Of Breath   Hydromet [Hydrocodone Bit-Homatrop Mbr] Shortness Of Breath and Other (See Comments)    Wheezing   Fish Allergy Other (See Comments)    Burning Sensation and Headache   Cephalexin Nausea Only   Morphine Other (See Comments)    "feels funny"   Sulfa Antibiotics Nausea And Vomiting         The results of significant diagnostics from this hospitalization (including imaging, microbiology, ancillary and laboratory) are listed below for  reference.    Microbiology: Recent Results (from the past 240 hours)  Urine Culture     Status: Abnormal   Collection Time: 06/20/23  9:40 PM   Specimen: Urine, Clean Catch  Result Value Ref Range Status   Specimen Description   Final    URINE, CLEAN CATCH Performed at [facility], [address], [place], [place] [address]    Special Requests   Final    NONE Performed at [facility], [address], [place], [place] [address]    Culture 50,000 COLONIES/mL YEAST (A)  Final   Report Status 06/21/2023 FINAL  Final  Urine Culture     Status: Abnormal   Collection Time: 06/26/23  3:06 PM   Specimen: Urine, Clean Catch  Result Value Ref Range Status   Specimen Description   Final    URINE, CLEAN CATCH Performed at [facility], [address], [place], [place] [address]    Special Requests   Final    NONE Performed at [facility], [address] [place], [place] [address]    Culture MULTIPLE SPECIES PRESENT, SUGGEST  RECOLLECTION (A)  Final   Report Status 06/27/2023 FINAL  Final    Procedures/Studies: CT ABDOMEN PELVIS W CONTRAST Result Date: 06/26/2023 CLINICAL DATA:  Abdominal pain and nausea. EXAM: CT ABDOMEN AND PELVIS WITH CONTRAST TECHNIQUE: Multidetector CT imaging of the abdomen and pelvis was performed using the standard protocol following bolus administration of intravenous contrast. RADIATION DOSE REDUCTION: This exam was performed according to the departmental dose-optimization program which includes automated exposure control, adjustment of the mA and/or kV according to patient size and/or use of iterative reconstruction technique. CONTRAST:  OMNIPAQUE IOHEXOL 300 MG/ML  SOLN COMPARISON:  06/20/2023 FINDINGS: Lower chest: No acute abnormality. Hepatobiliary: No focal liver abnormality is seen. No gallstones, gallbladder wall thickening, or biliary dilatation. Pancreas: Unremarkable. No pancreatic ductal dilatation or surrounding inflammatory changes. Spleen: Normal in size without focal abnormality. Adrenals/Urinary Tract: Normal adrenal glands. No nephrolithiasis, hydronephrosis, or suspicious mass. Urinary bladder appears normal for degree of distension. Stomach/Bowel: Stomach appears within normal limits. Signs of previous right hemicolectomy with coloenteric anastomosis in the right hemiabdomen. There is no pathologic dilatation of the large or small bowel loops. Left-sided colonic diverticulosis identified without signs of diverticulitis. Vascular/Lymphatic: Aortic atherosclerosis. No signs of abdominopelvic adenopathy. Reproductive: Status post hysterectomy. No adnexal masses. Other: No abdominal wall hernia or abnormality. No abdominopelvic ascites. No signs of pneumoperitoneum. Musculoskeletal: No acute or significant osseous findings. Thoracolumbar degenerative disc disease. Postsurgical changes in the lumbar spine. No acute or suspicious osseous findings. IMPRESSION: 1. No acute findings within  the abdomen or pelvis. 2. Signs of previous right hemicolectomy with coloenteric anastomosis in the right hemiabdomen. 3. Left-sided colonic diverticulosis without signs of diverticulitis. 4.  Aortic Atherosclerosis (ICD10-I70.0). Electronically Signed   By: Signa Kell M.D.   On: 06/26/2023 16:20   DG Chest Port 1 View Result Date: 06/26/2023 CLINICAL DATA:  Epigastric abdominal pain EXAM: PORTABLE CHEST 1 VIEW COMPARISON:  July 31, 2022 FINDINGS: The cardiomediastinal silhouette is unchanged in contour.Tortuous thoracic aorta. Atherosclerotic calcifications. No pleural effusion. No pneumothorax. No acute pleuroparenchymal abnormality. IMPRESSION: No acute cardiopulmonary abnormality. Electronically Signed   By: Meda Klinefelter M.D.   On: 06/26/2023 14:35   CT ABDOMEN PELVIS W CONTRAST Result Date: 06/20/2023 CLINICAL DATA:  Vomiting intermittently for a month. No p.o. intake in 24 hours. Abdominal pain. EXAM: CT ABDOMEN AND PELVIS WITH  CONTRAST TECHNIQUE: Multidetector CT imaging of the abdomen and pelvis was performed using the standard protocol following bolus administration of intravenous contrast. RADIATION DOSE REDUCTION: This exam was performed according to the departmental dose-optimization program which includes automated exposure control, adjustment of the mA and/or kV according to patient size and/or use of iterative reconstruction technique. CONTRAST:  OMNIPAQUE IOHEXOL 300 MG/ML  SOLN COMPARISON:  Abdominal ultrasound 08/04/2022 and CT 08/04/2022 FINDINGS: Lower chest: No acute abnormality. Hepatobiliary: Hepatic steatosis. Normal gallbladder. No biliary dilation. Pancreas: Unremarkable. Spleen: Unremarkable. Adrenals/Urinary Tract: Normal adrenal glands. No urinary calculi or hydronephrosis. Bladder is unremarkable. Stomach/Bowel: Normal caliber large and small bowel. Postoperative change of right colectomy with ileocolonic anastomosis in the right upper quadrant. Colonic  diverticulosis without diverticulitis. No bowel wall thickening. The appendix is normal.Stomach is within normal limits. Vascular/Lymphatic: Aortic atherosclerosis. No enlarged abdominal or pelvic lymph nodes. Reproductive: Unremarkable. Other: No free intraperitoneal fluid or air. Musculoskeletal: No acute fracture. Postoperative change about the lumbar spine from L2-L5. Unchanged fat containing right lumbar hernia. IMPRESSION: 1. No acute abnormality in the abdomen or pelvis. 2. Hepatic steatosis. 3.  Aortic Atherosclerosis (ICD10-I70.0). Electronically Signed   By: Minerva Fester M.D.   On: 06/20/2023 20:16    Labs: BNP (last 3 results) No results for input(s): "BNP" in the last 8760 hours. Basic Metabolic Panel: Recent Labs  Lab 06/23/23 1029 06/24/23 0546 06/26/23 1406 06/27/23 0303 06/28/23 0359  NA 139 140 138 136 138  K 3.2* 4.1 3.5 3.1* 3.7  CL 102 102 102 101 105  CO2 27 27 26 25 26   GLUCOSE 168* 98 106* 105* 93  BUN 23 21 28* 18 13  CREATININE 0.75 0.68 0.89 0.56 0.52  CALCIUM 9.3 9.7 9.7 8.9 8.9  MG  --   --   --   --  2.1   Liver Function Tests: Recent Labs  Lab 06/26/23 1406  AST 23  ALT 39  ALKPHOS 35*  BILITOT 0.7  PROT 6.4*  ALBUMIN 3.8   Recent Labs  Lab 06/26/23 1406  LIPASE 34   No results for input(s): "AMMONIA" in the last 168 hours. CBC: Recent Labs  Lab 06/26/23 1406 06/27/23 0303 06/28/23 0359  WBC 4.7 4.2 3.9*  NEUTROABS 3.1  --   --   HGB 12.2 10.8* 11.3*  HCT 38.3 34.7* 35.9*  MCV 97.2 100.9* 99.7  PLT 150 130* 127*   Cardiac Enzymes: No results for input(s): "CKTOTAL", "CKMB", "CKMBINDEX", "TROPONINI" in the last 168 hours. BNP: Invalid input(s): "POCBNP" CBG: No results for input(s): "GLUCAP" in the last 168 hours. D-Dimer No results for input(s): "DDIMER" in the last 72 hours. Hgb A1c No results for input(s): "HGBA1C" in the last 72 hours. Lipid Profile No results for input(s): "CHOL", "HDL", "LDLCALC", "TRIG",  "CHOLHDL", "LDLDIRECT" in the last 72 hours. Thyroid function studies No results for input(s): "TSH", "T4TOTAL", "T3FREE", "THYROIDAB" in the last 72 hours.  Invalid input(s): "FREET3" Anemia work up No results for input(s): "VITAMINB12", "FOLATE", "FERRITIN", "TIBC", "IRON", "RETICCTPCT" in the last 72 hours. Urinalysis    Component Value Date/Time   COLORURINE AMBER (A) 06/26/2023 1506   APPEARANCEUR HAZY (A) 06/26/2023 1506   LABSPEC 1.030 06/26/2023 1506   PHURINE 5.0 06/26/2023 1506   GLUCOSEU NEGATIVE 06/26/2023 1506   GLUCOSEU NEGATIVE 10/09/2014 1414   HGBUR SMALL (A) 06/26/2023 1506   BILIRUBINUR NEGATIVE 06/26/2023 1506   BILIRUBINUR small (A) 05/11/2023 1454   KETONESUR 5 (A) 06/26/2023 1506   PROTEINUR 30 (  A) 06/26/2023 1506   UROBILINOGEN 1.0 05/11/2023 1454   UROBILINOGEN 0.2 10/09/2014 1414   NITRITE NEGATIVE 06/26/2023 1506   LEUKOCYTESUR LARGE (A) 06/26/2023 1506   Sepsis Labs Recent Labs  Lab 06/26/23 1406 06/27/23 0303 06/28/23 0359  WBC 4.7 4.2 3.9*   Microbiology Recent Results (from the past 240 hours)  Urine Culture     Status: Abnormal   Collection Time: 06/20/23  9:40 PM   Specimen: Urine, Clean Catch  Result Value Ref Range Status   Specimen Description   Final    URINE, CLEAN CATCH Performed at [facility], [address], [place], [place] [address]    Special Requests   Final    NONE Performed at [facility], [address] [place], [place] [address]    Culture 50,000 COLONIES/mL YEAST (A)  Final   Report Status 06/21/2023 FINAL  Final  Urine Culture     Status: Abnormal   Collection Time: 06/26/23  3:06 PM   Specimen: Urine, Clean Catch  Result Value Ref Range Status   Specimen Description   Final    URINE, CLEAN CATCH Performed at [facility], [address], [place], [place] [address]    Special Requests   Final    NONE Performed at [facility],  [address], [place], [place] [address]    Culture MULTIPLE SPECIES PRESENT, SUGGEST RECOLLECTION (A)  Final   Report Status 06/27/2023 FINAL  Final  Time coordinating discharge: 35  minutes  SIGNED: Lanae Boast, MD  [facility] 06/29/2023, 11:04 AM  If 7PM-7AM, please contact night-coverage [email or web address]

## 2023-06-29 NOTE — Plan of Care (Signed)
   Problem: Education: Goal: Knowledge of General Education information will improve Description: Including pain rating scale, medication(s)/side effects and non-pharmacologic comfort measures Outcome: Progressing   Problem: Activity: Goal: Risk for activity intolerance will decrease Outcome: Progressing   Problem: Pain Managment: Goal: General experience of comfort will improve and/or be controlled Outcome: Progressing

## 2023-06-30 DIAGNOSIS — L309 Dermatitis, unspecified: Secondary | ICD-10-CM | POA: Diagnosis not present

## 2023-06-30 DIAGNOSIS — N3090 Cystitis, unspecified without hematuria: Secondary | ICD-10-CM | POA: Diagnosis not present

## 2023-06-30 DIAGNOSIS — R531 Weakness: Secondary | ICD-10-CM | POA: Diagnosis not present

## 2023-07-01 DIAGNOSIS — I1310 Hypertensive heart and chronic kidney disease without heart failure, with stage 1 through stage 4 chronic kidney disease, or unspecified chronic kidney disease: Secondary | ICD-10-CM | POA: Diagnosis not present

## 2023-07-01 DIAGNOSIS — Z7409 Other reduced mobility: Secondary | ICD-10-CM | POA: Diagnosis not present

## 2023-07-01 DIAGNOSIS — N189 Chronic kidney disease, unspecified: Secondary | ICD-10-CM | POA: Diagnosis not present

## 2023-07-01 DIAGNOSIS — K219 Gastro-esophageal reflux disease without esophagitis: Secondary | ICD-10-CM | POA: Diagnosis not present

## 2023-07-01 DIAGNOSIS — M6281 Muscle weakness (generalized): Secondary | ICD-10-CM | POA: Diagnosis not present

## 2023-07-01 DIAGNOSIS — K5900 Constipation, unspecified: Secondary | ICD-10-CM | POA: Diagnosis not present

## 2023-07-01 DIAGNOSIS — E785 Hyperlipidemia, unspecified: Secondary | ICD-10-CM | POA: Diagnosis not present

## 2023-07-01 DIAGNOSIS — R110 Nausea: Secondary | ICD-10-CM | POA: Diagnosis not present

## 2023-07-01 DIAGNOSIS — K130 Diseases of lips: Secondary | ICD-10-CM | POA: Diagnosis not present

## 2023-07-04 DIAGNOSIS — H548 Legal blindness, as defined in USA: Secondary | ICD-10-CM | POA: Diagnosis not present

## 2023-07-04 DIAGNOSIS — Z7409 Other reduced mobility: Secondary | ICD-10-CM | POA: Diagnosis not present

## 2023-07-04 DIAGNOSIS — M6281 Muscle weakness (generalized): Secondary | ICD-10-CM | POA: Diagnosis not present

## 2023-07-06 DIAGNOSIS — M549 Dorsalgia, unspecified: Secondary | ICD-10-CM | POA: Diagnosis not present

## 2023-07-06 DIAGNOSIS — K2970 Gastritis, unspecified, without bleeding: Secondary | ICD-10-CM | POA: Diagnosis not present

## 2023-07-06 DIAGNOSIS — I700 Atherosclerosis of aorta: Secondary | ICD-10-CM | POA: Diagnosis not present

## 2023-07-06 DIAGNOSIS — K130 Diseases of lips: Secondary | ICD-10-CM | POA: Diagnosis not present

## 2023-07-06 DIAGNOSIS — N189 Chronic kidney disease, unspecified: Secondary | ICD-10-CM | POA: Diagnosis not present

## 2023-07-06 DIAGNOSIS — N3000 Acute cystitis without hematuria: Secondary | ICD-10-CM | POA: Diagnosis not present

## 2023-07-06 DIAGNOSIS — K5900 Constipation, unspecified: Secondary | ICD-10-CM | POA: Diagnosis not present

## 2023-07-06 DIAGNOSIS — I1310 Hypertensive heart and chronic kidney disease without heart failure, with stage 1 through stage 4 chronic kidney disease, or unspecified chronic kidney disease: Secondary | ICD-10-CM | POA: Diagnosis not present

## 2023-07-06 DIAGNOSIS — D631 Anemia in chronic kidney disease: Secondary | ICD-10-CM | POA: Diagnosis not present

## 2023-07-07 DIAGNOSIS — M19011 Primary osteoarthritis, right shoulder: Secondary | ICD-10-CM | POA: Diagnosis not present

## 2023-07-07 DIAGNOSIS — M19012 Primary osteoarthritis, left shoulder: Secondary | ICD-10-CM | POA: Diagnosis not present

## 2023-07-12 DIAGNOSIS — K130 Diseases of lips: Secondary | ICD-10-CM | POA: Diagnosis not present

## 2023-07-12 DIAGNOSIS — D631 Anemia in chronic kidney disease: Secondary | ICD-10-CM | POA: Diagnosis not present

## 2023-07-12 DIAGNOSIS — K2970 Gastritis, unspecified, without bleeding: Secondary | ICD-10-CM | POA: Diagnosis not present

## 2023-07-12 DIAGNOSIS — K5900 Constipation, unspecified: Secondary | ICD-10-CM | POA: Diagnosis not present

## 2023-07-12 DIAGNOSIS — M549 Dorsalgia, unspecified: Secondary | ICD-10-CM | POA: Diagnosis not present

## 2023-07-12 DIAGNOSIS — N189 Chronic kidney disease, unspecified: Secondary | ICD-10-CM | POA: Diagnosis not present

## 2023-07-12 DIAGNOSIS — I1310 Hypertensive heart and chronic kidney disease without heart failure, with stage 1 through stage 4 chronic kidney disease, or unspecified chronic kidney disease: Secondary | ICD-10-CM | POA: Diagnosis not present

## 2023-07-12 DIAGNOSIS — N3000 Acute cystitis without hematuria: Secondary | ICD-10-CM | POA: Diagnosis not present

## 2023-07-12 DIAGNOSIS — I700 Atherosclerosis of aorta: Secondary | ICD-10-CM | POA: Diagnosis not present

## 2023-07-14 DIAGNOSIS — K5900 Constipation, unspecified: Secondary | ICD-10-CM | POA: Diagnosis not present

## 2023-07-14 DIAGNOSIS — H548 Legal blindness, as defined in USA: Secondary | ICD-10-CM | POA: Diagnosis not present

## 2023-07-14 DIAGNOSIS — K130 Diseases of lips: Secondary | ICD-10-CM | POA: Diagnosis not present

## 2023-07-14 DIAGNOSIS — K2970 Gastritis, unspecified, without bleeding: Secondary | ICD-10-CM | POA: Diagnosis not present

## 2023-07-14 DIAGNOSIS — R2689 Other abnormalities of gait and mobility: Secondary | ICD-10-CM | POA: Diagnosis not present

## 2023-07-14 DIAGNOSIS — D631 Anemia in chronic kidney disease: Secondary | ICD-10-CM | POA: Diagnosis not present

## 2023-07-14 DIAGNOSIS — K5909 Other constipation: Secondary | ICD-10-CM | POA: Diagnosis not present

## 2023-07-14 DIAGNOSIS — M6281 Muscle weakness (generalized): Secondary | ICD-10-CM | POA: Diagnosis not present

## 2023-07-14 DIAGNOSIS — I10 Essential (primary) hypertension: Secondary | ICD-10-CM | POA: Diagnosis not present

## 2023-07-14 DIAGNOSIS — I700 Atherosclerosis of aorta: Secondary | ICD-10-CM | POA: Diagnosis not present

## 2023-07-14 DIAGNOSIS — M549 Dorsalgia, unspecified: Secondary | ICD-10-CM | POA: Diagnosis not present

## 2023-07-14 DIAGNOSIS — N3000 Acute cystitis without hematuria: Secondary | ICD-10-CM | POA: Diagnosis not present

## 2023-07-14 DIAGNOSIS — H3552 Pigmentary retinal dystrophy: Secondary | ICD-10-CM | POA: Diagnosis not present

## 2023-07-14 DIAGNOSIS — R5381 Other malaise: Secondary | ICD-10-CM | POA: Diagnosis not present

## 2023-07-14 DIAGNOSIS — N189 Chronic kidney disease, unspecified: Secondary | ICD-10-CM | POA: Diagnosis not present

## 2023-07-14 DIAGNOSIS — I1310 Hypertensive heart and chronic kidney disease without heart failure, with stage 1 through stage 4 chronic kidney disease, or unspecified chronic kidney disease: Secondary | ICD-10-CM | POA: Diagnosis not present

## 2023-07-14 DIAGNOSIS — K219 Gastro-esophageal reflux disease without esophagitis: Secondary | ICD-10-CM | POA: Diagnosis not present

## 2023-07-20 DIAGNOSIS — N189 Chronic kidney disease, unspecified: Secondary | ICD-10-CM | POA: Diagnosis not present

## 2023-07-20 DIAGNOSIS — K5900 Constipation, unspecified: Secondary | ICD-10-CM | POA: Diagnosis not present

## 2023-07-20 DIAGNOSIS — K130 Diseases of lips: Secondary | ICD-10-CM | POA: Diagnosis not present

## 2023-07-20 DIAGNOSIS — I700 Atherosclerosis of aorta: Secondary | ICD-10-CM | POA: Diagnosis not present

## 2023-07-20 DIAGNOSIS — K2970 Gastritis, unspecified, without bleeding: Secondary | ICD-10-CM | POA: Diagnosis not present

## 2023-07-20 DIAGNOSIS — N3000 Acute cystitis without hematuria: Secondary | ICD-10-CM | POA: Diagnosis not present

## 2023-07-20 DIAGNOSIS — I1310 Hypertensive heart and chronic kidney disease without heart failure, with stage 1 through stage 4 chronic kidney disease, or unspecified chronic kidney disease: Secondary | ICD-10-CM | POA: Diagnosis not present

## 2023-07-20 DIAGNOSIS — M549 Dorsalgia, unspecified: Secondary | ICD-10-CM | POA: Diagnosis not present

## 2023-07-20 DIAGNOSIS — D631 Anemia in chronic kidney disease: Secondary | ICD-10-CM | POA: Diagnosis not present

## 2023-07-21 DIAGNOSIS — D631 Anemia in chronic kidney disease: Secondary | ICD-10-CM | POA: Diagnosis not present

## 2023-07-21 DIAGNOSIS — N3000 Acute cystitis without hematuria: Secondary | ICD-10-CM | POA: Diagnosis not present

## 2023-07-21 DIAGNOSIS — N189 Chronic kidney disease, unspecified: Secondary | ICD-10-CM | POA: Diagnosis not present

## 2023-07-21 DIAGNOSIS — K2970 Gastritis, unspecified, without bleeding: Secondary | ICD-10-CM | POA: Diagnosis not present

## 2023-07-21 DIAGNOSIS — I1310 Hypertensive heart and chronic kidney disease without heart failure, with stage 1 through stage 4 chronic kidney disease, or unspecified chronic kidney disease: Secondary | ICD-10-CM | POA: Diagnosis not present

## 2023-07-21 DIAGNOSIS — K130 Diseases of lips: Secondary | ICD-10-CM | POA: Diagnosis not present

## 2023-07-21 DIAGNOSIS — I700 Atherosclerosis of aorta: Secondary | ICD-10-CM | POA: Diagnosis not present

## 2023-07-21 DIAGNOSIS — M549 Dorsalgia, unspecified: Secondary | ICD-10-CM | POA: Diagnosis not present

## 2023-07-21 DIAGNOSIS — K5900 Constipation, unspecified: Secondary | ICD-10-CM | POA: Diagnosis not present

## 2023-07-22 DIAGNOSIS — K2970 Gastritis, unspecified, without bleeding: Secondary | ICD-10-CM | POA: Diagnosis not present

## 2023-07-22 DIAGNOSIS — D631 Anemia in chronic kidney disease: Secondary | ICD-10-CM | POA: Diagnosis not present

## 2023-07-22 DIAGNOSIS — K5900 Constipation, unspecified: Secondary | ICD-10-CM | POA: Diagnosis not present

## 2023-07-22 DIAGNOSIS — N3000 Acute cystitis without hematuria: Secondary | ICD-10-CM | POA: Diagnosis not present

## 2023-07-22 DIAGNOSIS — K130 Diseases of lips: Secondary | ICD-10-CM | POA: Diagnosis not present

## 2023-07-22 DIAGNOSIS — M549 Dorsalgia, unspecified: Secondary | ICD-10-CM | POA: Diagnosis not present

## 2023-07-22 DIAGNOSIS — I700 Atherosclerosis of aorta: Secondary | ICD-10-CM | POA: Diagnosis not present

## 2023-07-22 DIAGNOSIS — N189 Chronic kidney disease, unspecified: Secondary | ICD-10-CM | POA: Diagnosis not present

## 2023-07-22 DIAGNOSIS — I1310 Hypertensive heart and chronic kidney disease without heart failure, with stage 1 through stage 4 chronic kidney disease, or unspecified chronic kidney disease: Secondary | ICD-10-CM | POA: Diagnosis not present

## 2023-07-25 DIAGNOSIS — K130 Diseases of lips: Secondary | ICD-10-CM | POA: Diagnosis not present

## 2023-07-25 DIAGNOSIS — D631 Anemia in chronic kidney disease: Secondary | ICD-10-CM | POA: Diagnosis not present

## 2023-07-25 DIAGNOSIS — N189 Chronic kidney disease, unspecified: Secondary | ICD-10-CM | POA: Diagnosis not present

## 2023-07-25 DIAGNOSIS — M549 Dorsalgia, unspecified: Secondary | ICD-10-CM | POA: Diagnosis not present

## 2023-07-25 DIAGNOSIS — I1310 Hypertensive heart and chronic kidney disease without heart failure, with stage 1 through stage 4 chronic kidney disease, or unspecified chronic kidney disease: Secondary | ICD-10-CM | POA: Diagnosis not present

## 2023-07-25 DIAGNOSIS — K5900 Constipation, unspecified: Secondary | ICD-10-CM | POA: Diagnosis not present

## 2023-07-25 DIAGNOSIS — I700 Atherosclerosis of aorta: Secondary | ICD-10-CM | POA: Diagnosis not present

## 2023-07-25 DIAGNOSIS — K2970 Gastritis, unspecified, without bleeding: Secondary | ICD-10-CM | POA: Diagnosis not present

## 2023-07-25 DIAGNOSIS — N3000 Acute cystitis without hematuria: Secondary | ICD-10-CM | POA: Diagnosis not present

## 2023-07-26 DIAGNOSIS — M47896 Other spondylosis, lumbar region: Secondary | ICD-10-CM | POA: Diagnosis not present

## 2023-07-26 DIAGNOSIS — M545 Low back pain, unspecified: Secondary | ICD-10-CM | POA: Diagnosis not present

## 2023-07-27 DIAGNOSIS — D631 Anemia in chronic kidney disease: Secondary | ICD-10-CM | POA: Diagnosis not present

## 2023-07-27 DIAGNOSIS — N189 Chronic kidney disease, unspecified: Secondary | ICD-10-CM | POA: Diagnosis not present

## 2023-07-27 DIAGNOSIS — K130 Diseases of lips: Secondary | ICD-10-CM | POA: Diagnosis not present

## 2023-07-27 DIAGNOSIS — N3000 Acute cystitis without hematuria: Secondary | ICD-10-CM | POA: Diagnosis not present

## 2023-07-27 DIAGNOSIS — K2970 Gastritis, unspecified, without bleeding: Secondary | ICD-10-CM | POA: Diagnosis not present

## 2023-07-27 DIAGNOSIS — I1310 Hypertensive heart and chronic kidney disease without heart failure, with stage 1 through stage 4 chronic kidney disease, or unspecified chronic kidney disease: Secondary | ICD-10-CM | POA: Diagnosis not present

## 2023-07-27 DIAGNOSIS — M549 Dorsalgia, unspecified: Secondary | ICD-10-CM | POA: Diagnosis not present

## 2023-07-27 DIAGNOSIS — I700 Atherosclerosis of aorta: Secondary | ICD-10-CM | POA: Diagnosis not present

## 2023-07-27 DIAGNOSIS — K5900 Constipation, unspecified: Secondary | ICD-10-CM | POA: Diagnosis not present

## 2023-07-29 DIAGNOSIS — K2970 Gastritis, unspecified, without bleeding: Secondary | ICD-10-CM | POA: Diagnosis not present

## 2023-07-29 DIAGNOSIS — K130 Diseases of lips: Secondary | ICD-10-CM | POA: Diagnosis not present

## 2023-07-29 DIAGNOSIS — I1310 Hypertensive heart and chronic kidney disease without heart failure, with stage 1 through stage 4 chronic kidney disease, or unspecified chronic kidney disease: Secondary | ICD-10-CM | POA: Diagnosis not present

## 2023-07-29 DIAGNOSIS — D631 Anemia in chronic kidney disease: Secondary | ICD-10-CM | POA: Diagnosis not present

## 2023-07-29 DIAGNOSIS — I700 Atherosclerosis of aorta: Secondary | ICD-10-CM | POA: Diagnosis not present

## 2023-07-29 DIAGNOSIS — M549 Dorsalgia, unspecified: Secondary | ICD-10-CM | POA: Diagnosis not present

## 2023-07-29 DIAGNOSIS — N3000 Acute cystitis without hematuria: Secondary | ICD-10-CM | POA: Diagnosis not present

## 2023-07-29 DIAGNOSIS — N189 Chronic kidney disease, unspecified: Secondary | ICD-10-CM | POA: Diagnosis not present

## 2023-07-29 DIAGNOSIS — K5900 Constipation, unspecified: Secondary | ICD-10-CM | POA: Diagnosis not present

## 2023-08-04 DIAGNOSIS — K2970 Gastritis, unspecified, without bleeding: Secondary | ICD-10-CM | POA: Diagnosis not present

## 2023-08-04 DIAGNOSIS — N189 Chronic kidney disease, unspecified: Secondary | ICD-10-CM | POA: Diagnosis not present

## 2023-08-04 DIAGNOSIS — M549 Dorsalgia, unspecified: Secondary | ICD-10-CM | POA: Diagnosis not present

## 2023-08-04 DIAGNOSIS — I1310 Hypertensive heart and chronic kidney disease without heart failure, with stage 1 through stage 4 chronic kidney disease, or unspecified chronic kidney disease: Secondary | ICD-10-CM | POA: Diagnosis not present

## 2023-08-04 DIAGNOSIS — N3000 Acute cystitis without hematuria: Secondary | ICD-10-CM | POA: Diagnosis not present

## 2023-08-04 DIAGNOSIS — K130 Diseases of lips: Secondary | ICD-10-CM | POA: Diagnosis not present

## 2023-08-04 DIAGNOSIS — K5900 Constipation, unspecified: Secondary | ICD-10-CM | POA: Diagnosis not present

## 2023-08-04 DIAGNOSIS — I700 Atherosclerosis of aorta: Secondary | ICD-10-CM | POA: Diagnosis not present

## 2023-08-04 DIAGNOSIS — D631 Anemia in chronic kidney disease: Secondary | ICD-10-CM | POA: Diagnosis not present

## 2023-08-05 DIAGNOSIS — D631 Anemia in chronic kidney disease: Secondary | ICD-10-CM | POA: Diagnosis not present

## 2023-08-05 DIAGNOSIS — N189 Chronic kidney disease, unspecified: Secondary | ICD-10-CM | POA: Diagnosis not present

## 2023-08-05 DIAGNOSIS — I700 Atherosclerosis of aorta: Secondary | ICD-10-CM | POA: Diagnosis not present

## 2023-08-05 DIAGNOSIS — I1310 Hypertensive heart and chronic kidney disease without heart failure, with stage 1 through stage 4 chronic kidney disease, or unspecified chronic kidney disease: Secondary | ICD-10-CM | POA: Diagnosis not present

## 2023-08-05 DIAGNOSIS — K2970 Gastritis, unspecified, without bleeding: Secondary | ICD-10-CM | POA: Diagnosis not present

## 2023-08-05 DIAGNOSIS — K130 Diseases of lips: Secondary | ICD-10-CM | POA: Diagnosis not present

## 2023-08-05 DIAGNOSIS — K5900 Constipation, unspecified: Secondary | ICD-10-CM | POA: Diagnosis not present

## 2023-08-05 DIAGNOSIS — N3000 Acute cystitis without hematuria: Secondary | ICD-10-CM | POA: Diagnosis not present

## 2023-08-05 DIAGNOSIS — M549 Dorsalgia, unspecified: Secondary | ICD-10-CM | POA: Diagnosis not present

## 2023-08-08 DIAGNOSIS — I700 Atherosclerosis of aorta: Secondary | ICD-10-CM | POA: Diagnosis not present

## 2023-08-08 DIAGNOSIS — K5900 Constipation, unspecified: Secondary | ICD-10-CM | POA: Diagnosis not present

## 2023-08-08 DIAGNOSIS — D631 Anemia in chronic kidney disease: Secondary | ICD-10-CM | POA: Diagnosis not present

## 2023-08-08 DIAGNOSIS — M549 Dorsalgia, unspecified: Secondary | ICD-10-CM | POA: Diagnosis not present

## 2023-08-08 DIAGNOSIS — N189 Chronic kidney disease, unspecified: Secondary | ICD-10-CM | POA: Diagnosis not present

## 2023-08-08 DIAGNOSIS — N3000 Acute cystitis without hematuria: Secondary | ICD-10-CM | POA: Diagnosis not present

## 2023-08-08 DIAGNOSIS — I1310 Hypertensive heart and chronic kidney disease without heart failure, with stage 1 through stage 4 chronic kidney disease, or unspecified chronic kidney disease: Secondary | ICD-10-CM | POA: Diagnosis not present

## 2023-08-08 DIAGNOSIS — K130 Diseases of lips: Secondary | ICD-10-CM | POA: Diagnosis not present

## 2023-08-08 DIAGNOSIS — K2970 Gastritis, unspecified, without bleeding: Secondary | ICD-10-CM | POA: Diagnosis not present

## 2023-08-10 DIAGNOSIS — K5900 Constipation, unspecified: Secondary | ICD-10-CM | POA: Diagnosis not present

## 2023-08-10 DIAGNOSIS — M549 Dorsalgia, unspecified: Secondary | ICD-10-CM | POA: Diagnosis not present

## 2023-08-10 DIAGNOSIS — D631 Anemia in chronic kidney disease: Secondary | ICD-10-CM | POA: Diagnosis not present

## 2023-08-10 DIAGNOSIS — N3000 Acute cystitis without hematuria: Secondary | ICD-10-CM | POA: Diagnosis not present

## 2023-08-10 DIAGNOSIS — N189 Chronic kidney disease, unspecified: Secondary | ICD-10-CM | POA: Diagnosis not present

## 2023-08-10 DIAGNOSIS — I700 Atherosclerosis of aorta: Secondary | ICD-10-CM | POA: Diagnosis not present

## 2023-08-10 DIAGNOSIS — K2970 Gastritis, unspecified, without bleeding: Secondary | ICD-10-CM | POA: Diagnosis not present

## 2023-08-10 DIAGNOSIS — I1310 Hypertensive heart and chronic kidney disease without heart failure, with stage 1 through stage 4 chronic kidney disease, or unspecified chronic kidney disease: Secondary | ICD-10-CM | POA: Diagnosis not present

## 2023-08-10 DIAGNOSIS — K130 Diseases of lips: Secondary | ICD-10-CM | POA: Diagnosis not present

## 2023-08-15 DIAGNOSIS — K5900 Constipation, unspecified: Secondary | ICD-10-CM | POA: Diagnosis not present

## 2023-08-15 DIAGNOSIS — K130 Diseases of lips: Secondary | ICD-10-CM | POA: Diagnosis not present

## 2023-08-15 DIAGNOSIS — N189 Chronic kidney disease, unspecified: Secondary | ICD-10-CM | POA: Diagnosis not present

## 2023-08-15 DIAGNOSIS — M549 Dorsalgia, unspecified: Secondary | ICD-10-CM | POA: Diagnosis not present

## 2023-08-15 DIAGNOSIS — D631 Anemia in chronic kidney disease: Secondary | ICD-10-CM | POA: Diagnosis not present

## 2023-08-15 DIAGNOSIS — K2970 Gastritis, unspecified, without bleeding: Secondary | ICD-10-CM | POA: Diagnosis not present

## 2023-08-15 DIAGNOSIS — I1310 Hypertensive heart and chronic kidney disease without heart failure, with stage 1 through stage 4 chronic kidney disease, or unspecified chronic kidney disease: Secondary | ICD-10-CM | POA: Diagnosis not present

## 2023-08-15 DIAGNOSIS — I700 Atherosclerosis of aorta: Secondary | ICD-10-CM | POA: Diagnosis not present

## 2023-08-15 DIAGNOSIS — N3000 Acute cystitis without hematuria: Secondary | ICD-10-CM | POA: Diagnosis not present

## 2023-08-16 DIAGNOSIS — N3000 Acute cystitis without hematuria: Secondary | ICD-10-CM | POA: Diagnosis not present

## 2023-08-16 DIAGNOSIS — N189 Chronic kidney disease, unspecified: Secondary | ICD-10-CM | POA: Diagnosis not present

## 2023-08-16 DIAGNOSIS — K5900 Constipation, unspecified: Secondary | ICD-10-CM | POA: Diagnosis not present

## 2023-08-16 DIAGNOSIS — D631 Anemia in chronic kidney disease: Secondary | ICD-10-CM | POA: Diagnosis not present

## 2023-08-16 DIAGNOSIS — I1310 Hypertensive heart and chronic kidney disease without heart failure, with stage 1 through stage 4 chronic kidney disease, or unspecified chronic kidney disease: Secondary | ICD-10-CM | POA: Diagnosis not present

## 2023-08-16 DIAGNOSIS — K130 Diseases of lips: Secondary | ICD-10-CM | POA: Diagnosis not present

## 2023-08-16 DIAGNOSIS — I700 Atherosclerosis of aorta: Secondary | ICD-10-CM | POA: Diagnosis not present

## 2023-08-16 DIAGNOSIS — M549 Dorsalgia, unspecified: Secondary | ICD-10-CM | POA: Diagnosis not present

## 2023-08-16 DIAGNOSIS — K2970 Gastritis, unspecified, without bleeding: Secondary | ICD-10-CM | POA: Diagnosis not present

## 2023-08-24 DIAGNOSIS — N3000 Acute cystitis without hematuria: Secondary | ICD-10-CM | POA: Diagnosis not present

## 2023-08-24 DIAGNOSIS — K2970 Gastritis, unspecified, without bleeding: Secondary | ICD-10-CM | POA: Diagnosis not present

## 2023-08-24 DIAGNOSIS — I1310 Hypertensive heart and chronic kidney disease without heart failure, with stage 1 through stage 4 chronic kidney disease, or unspecified chronic kidney disease: Secondary | ICD-10-CM | POA: Diagnosis not present

## 2023-08-24 DIAGNOSIS — K5900 Constipation, unspecified: Secondary | ICD-10-CM | POA: Diagnosis not present

## 2023-08-24 DIAGNOSIS — M549 Dorsalgia, unspecified: Secondary | ICD-10-CM | POA: Diagnosis not present

## 2023-08-24 DIAGNOSIS — N189 Chronic kidney disease, unspecified: Secondary | ICD-10-CM | POA: Diagnosis not present

## 2023-08-24 DIAGNOSIS — I700 Atherosclerosis of aorta: Secondary | ICD-10-CM | POA: Diagnosis not present

## 2023-08-24 DIAGNOSIS — K130 Diseases of lips: Secondary | ICD-10-CM | POA: Diagnosis not present

## 2023-08-24 DIAGNOSIS — D631 Anemia in chronic kidney disease: Secondary | ICD-10-CM | POA: Diagnosis not present

## 2023-08-26 DIAGNOSIS — N3000 Acute cystitis without hematuria: Secondary | ICD-10-CM | POA: Diagnosis not present

## 2023-08-26 DIAGNOSIS — K130 Diseases of lips: Secondary | ICD-10-CM | POA: Diagnosis not present

## 2023-08-26 DIAGNOSIS — K2970 Gastritis, unspecified, without bleeding: Secondary | ICD-10-CM | POA: Diagnosis not present

## 2023-08-26 DIAGNOSIS — N189 Chronic kidney disease, unspecified: Secondary | ICD-10-CM | POA: Diagnosis not present

## 2023-08-26 DIAGNOSIS — D631 Anemia in chronic kidney disease: Secondary | ICD-10-CM | POA: Diagnosis not present

## 2023-08-26 DIAGNOSIS — I700 Atherosclerosis of aorta: Secondary | ICD-10-CM | POA: Diagnosis not present

## 2023-08-26 DIAGNOSIS — K5900 Constipation, unspecified: Secondary | ICD-10-CM | POA: Diagnosis not present

## 2023-08-26 DIAGNOSIS — M549 Dorsalgia, unspecified: Secondary | ICD-10-CM | POA: Diagnosis not present

## 2023-08-26 DIAGNOSIS — I1310 Hypertensive heart and chronic kidney disease without heart failure, with stage 1 through stage 4 chronic kidney disease, or unspecified chronic kidney disease: Secondary | ICD-10-CM | POA: Diagnosis not present

## 2023-08-29 DIAGNOSIS — M549 Dorsalgia, unspecified: Secondary | ICD-10-CM | POA: Diagnosis not present

## 2023-08-29 DIAGNOSIS — K130 Diseases of lips: Secondary | ICD-10-CM | POA: Diagnosis not present

## 2023-08-29 DIAGNOSIS — K2970 Gastritis, unspecified, without bleeding: Secondary | ICD-10-CM | POA: Diagnosis not present

## 2023-08-29 DIAGNOSIS — I1310 Hypertensive heart and chronic kidney disease without heart failure, with stage 1 through stage 4 chronic kidney disease, or unspecified chronic kidney disease: Secondary | ICD-10-CM | POA: Diagnosis not present

## 2023-08-29 DIAGNOSIS — I700 Atherosclerosis of aorta: Secondary | ICD-10-CM | POA: Diagnosis not present

## 2023-08-29 DIAGNOSIS — N3000 Acute cystitis without hematuria: Secondary | ICD-10-CM | POA: Diagnosis not present

## 2023-08-29 DIAGNOSIS — K5900 Constipation, unspecified: Secondary | ICD-10-CM | POA: Diagnosis not present

## 2023-08-29 DIAGNOSIS — D631 Anemia in chronic kidney disease: Secondary | ICD-10-CM | POA: Diagnosis not present

## 2023-08-29 DIAGNOSIS — N189 Chronic kidney disease, unspecified: Secondary | ICD-10-CM | POA: Diagnosis not present

## 2023-09-02 DIAGNOSIS — N3000 Acute cystitis without hematuria: Secondary | ICD-10-CM | POA: Diagnosis not present

## 2023-09-02 DIAGNOSIS — N189 Chronic kidney disease, unspecified: Secondary | ICD-10-CM | POA: Diagnosis not present

## 2023-09-02 DIAGNOSIS — K2970 Gastritis, unspecified, without bleeding: Secondary | ICD-10-CM | POA: Diagnosis not present

## 2023-09-02 DIAGNOSIS — K5900 Constipation, unspecified: Secondary | ICD-10-CM | POA: Diagnosis not present

## 2023-09-02 DIAGNOSIS — M549 Dorsalgia, unspecified: Secondary | ICD-10-CM | POA: Diagnosis not present

## 2023-09-02 DIAGNOSIS — D631 Anemia in chronic kidney disease: Secondary | ICD-10-CM | POA: Diagnosis not present

## 2023-09-02 DIAGNOSIS — K130 Diseases of lips: Secondary | ICD-10-CM | POA: Diagnosis not present

## 2023-09-02 DIAGNOSIS — I1310 Hypertensive heart and chronic kidney disease without heart failure, with stage 1 through stage 4 chronic kidney disease, or unspecified chronic kidney disease: Secondary | ICD-10-CM | POA: Diagnosis not present

## 2023-09-02 DIAGNOSIS — I700 Atherosclerosis of aorta: Secondary | ICD-10-CM | POA: Diagnosis not present

## 2023-10-10 DIAGNOSIS — E785 Hyperlipidemia, unspecified: Secondary | ICD-10-CM | POA: Diagnosis not present

## 2023-10-10 DIAGNOSIS — D640 Hereditary sideroblastic anemia: Secondary | ICD-10-CM | POA: Diagnosis not present

## 2023-10-10 DIAGNOSIS — K5909 Other constipation: Secondary | ICD-10-CM | POA: Diagnosis not present

## 2023-10-10 DIAGNOSIS — R5383 Other fatigue: Secondary | ICD-10-CM | POA: Diagnosis not present

## 2023-10-10 DIAGNOSIS — I10 Essential (primary) hypertension: Secondary | ICD-10-CM | POA: Diagnosis not present

## 2023-10-15 ENCOUNTER — Other Ambulatory Visit: Payer: Self-pay

## 2023-10-15 ENCOUNTER — Emergency Department (HOSPITAL_COMMUNITY)

## 2023-10-15 ENCOUNTER — Emergency Department (HOSPITAL_COMMUNITY)
Admission: EM | Admit: 2023-10-15 | Discharge: 2023-10-15 | Disposition: A | Discharge status: Home / Self Care | Attending: Emergency Medicine | Admitting: Emergency Medicine

## 2023-10-15 ENCOUNTER — Encounter (HOSPITAL_COMMUNITY): Payer: Self-pay

## 2023-10-15 DIAGNOSIS — R531 Weakness: Secondary | ICD-10-CM | POA: Diagnosis not present

## 2023-10-15 DIAGNOSIS — R058 Other specified cough: Secondary | ICD-10-CM | POA: Diagnosis not present

## 2023-10-15 DIAGNOSIS — R4182 Altered mental status, unspecified: Secondary | ICD-10-CM | POA: Diagnosis not present

## 2023-10-15 DIAGNOSIS — R059 Cough, unspecified: Secondary | ICD-10-CM | POA: Insufficient documentation

## 2023-10-15 DIAGNOSIS — I10 Essential (primary) hypertension: Secondary | ICD-10-CM | POA: Diagnosis not present

## 2023-10-15 DIAGNOSIS — N3090 Cystitis, unspecified without hematuria: Secondary | ICD-10-CM | POA: Insufficient documentation

## 2023-10-15 DIAGNOSIS — Z79899 Other long term (current) drug therapy: Secondary | ICD-10-CM | POA: Diagnosis not present

## 2023-10-15 DIAGNOSIS — E876 Hypokalemia: Secondary | ICD-10-CM | POA: Diagnosis not present

## 2023-10-15 LAB — CBC
HCT: 37.9 % (ref 36.0–46.0)
Hemoglobin: 12.1 g/dL (ref 12.0–15.0)
MCH: 30.7 pg (ref 26.0–34.0)
MCHC: 31.9 g/dL (ref 30.0–36.0)
MCV: 96.2 fL (ref 80.0–100.0)
Platelets: 155 10*3/uL (ref 150–400)
RBC: 3.94 MIL/uL (ref 3.87–5.11)
RDW: 12.6 % (ref 11.5–15.5)
WBC: 6.1 10*3/uL (ref 4.0–10.5)
nRBC: 0 % (ref 0.0–0.2)

## 2023-10-15 LAB — URINALYSIS, ROUTINE W REFLEX MICROSCOPIC
Bilirubin Urine: NEGATIVE
Glucose, UA: NEGATIVE mg/dL
Ketones, ur: 20 mg/dL — AB
Nitrite: NEGATIVE
Protein, ur: NEGATIVE mg/dL
Specific Gravity, Urine: 1.023 (ref 1.005–1.030)
WBC, UA: >50 WBC/hpf (ref 0–5)
pH: 5 (ref 5.0–8.0)

## 2023-10-15 LAB — COMPREHENSIVE METABOLIC PANEL WITH GFR
ALT: 15 U/L (ref 0–44)
AST: 18 U/L (ref 15–41)
Albumin: 3.7 g/dL (ref 3.5–5.0)
Alkaline Phosphatase: 32 U/L — ABNORMAL LOW (ref 38–126)
Anion gap: 12 (ref 5–15)
BUN: 18 mg/dL (ref 8–23)
CO2: 27 mmol/L (ref 22–32)
Calcium: 9.7 mg/dL (ref 8.9–10.3)
Chloride: 99 mmol/L (ref 98–111)
Creatinine, Ser: 0.83 mg/dL (ref 0.44–1.00)
GFR, Estimated: >60 mL/min (ref >60)
Glucose, Bld: 145 mg/dL — ABNORMAL HIGH (ref 70–99)
Potassium: 3.3 mmol/L — ABNORMAL LOW (ref 3.5–5.1)
Sodium: 138 mmol/L (ref 135–145)
Total Bilirubin: 1 mg/dL (ref 0.0–1.2)
Total Protein: 6.3 g/dL — ABNORMAL LOW (ref 6.5–8.1)

## 2023-10-15 LAB — RESP PANEL BY RT-PCR (RSV, FLU A&B, COVID)  RVPGX2
Influenza A by PCR: NEGATIVE
Influenza B by PCR: NEGATIVE
Resp Syncytial Virus by PCR: NEGATIVE
SARS Coronavirus 2 by RT PCR: NEGATIVE

## 2023-10-15 LAB — TROPONIN I (HIGH SENSITIVITY)
Troponin I (High Sensitivity): 6 ng/L (ref <18)
Troponin I (High Sensitivity): 6 ng/L (ref <18)

## 2023-10-15 MED ORDER — SODIUM CHLORIDE 0.9 % IV SOLN
1.0000 g | Freq: Once | INTRAVENOUS | Status: AC
  Administered 2023-10-15: 1 g via INTRAVENOUS
  Filled 2023-10-15: qty 10

## 2023-10-15 MED ORDER — SODIUM CHLORIDE 0.9 % IV BOLUS
1000.0000 mL | Freq: Once | INTRAVENOUS | Status: AC
  Administered 2023-10-15: 1000 mL via INTRAVENOUS

## 2023-10-15 MED ORDER — CIPROFLOXACIN HCL 500 MG PO TABS: 500.0000 mg | ORAL_TABLET | Freq: Two times a day (BID) | ORAL | 0 refills | Status: DC

## 2023-10-15 NOTE — ED Triage Notes (Addendum)
 Pt arrived POV from home c/o forgetfulness, dizziness and bilateral leg weakness that has gradually gotten worse over the last couple of months. Pt is also c/o of urinary frequency and urgency. PT is also legally blind and deaf but does have hearing aids.

## 2023-10-15 NOTE — Discharge Instructions (Signed)
 Please take the entire course of antibiotics that we have prescribed, follow-up with your primary care doctor to ensure your symptoms are improving or return to the emergency department if you have worsening dysuria, or weakness.

## 2023-10-15 NOTE — ED Provider Notes (Signed)
 [place] [facility] Provider Note   CSN: [id number] Arrival date & time: 10/15/23  1354     History  Chief Complaint  Patient presents with   Weakness   Altered Mental Status    Kathryn Romero is a 88 y.o. female with past medical history significant for anemia, hyperlipidemia, blindness, hypertension, chronic back pain, GERD who presents with concern for increased forgetfulness, dizziness, generalized weakness, urinary frequency and urgency.  Brought in by her daughter who is worried about urinary tract infection.  No unilateral weakness.  Does not feel like the room is spinning, describes her dizziness is more of a generalized unsteadiness.   Weakness Altered Mental Status Associated symptoms: weakness        Home Medications Prior to Admission medications   Medication Sig Start Date End Date Taking? Authorizing Provider  ciprofloxacin  (CIPRO ) 500 MG tablet Take 1 tablet (500 mg total) by mouth every 12 (twelve) hours. 10/15/23  Yes Chinedum Vanhouten H, PA-C  ondansetron  (ZOFRAN ) 4 MG tablet Take 1 tablet (4 mg total) by mouth every 8 (eight) hours as needed for nausea or vomiting. 05/11/23   Ward, Char Common, PA-C  OVER THE COUNTER MEDICATION Take 2 tablets by mouth at bedtime. OTC pain med    [provider]  pantoprazole  (PROTONIX ) 40 MG tablet Take 40 mg by mouth daily. 05/24/23   [provider]  psyllium (HYDROCIL/METAMUCIL) 95 % PACK Take 1 packet by mouth daily.    [provider]  rosuvastatin  (CRESTOR ) 5 MG tablet Take 5 mg by mouth daily.    [provider]      Allergies    Codeine, Hydromet [hydrocodone  bit-homatrop mbr], Fish allergy, Cephalexin , Morphine, and Sulfa antibiotics    Review of Systems   Review of Systems  Neurological:  Positive for weakness.  All other systems reviewed and are negative.   Physical Exam Updated Vital Signs BP (!) 115/59 (BP Location: Right Arm)   Pulse  71   Temp 97.9 F (36.6 C)   Resp 16   Ht 5\' 1"  (1.549 m)   SpO2 97%   BMI 31.66 kg/m  Physical Exam Vitals and nursing note reviewed.  Constitutional:      General: She is not in acute distress.    Appearance: Normal appearance.  HENT:     Head: Normocephalic and atraumatic.  Eyes:     General:        Right eye: No discharge.        Left eye: No discharge.  Cardiovascular:     Rate and Rhythm: Normal rate and regular rhythm.     Heart sounds: No murmur heard.    No friction rub. No gallop.  Pulmonary:     Effort: Pulmonary effort is normal.     Breath sounds: Normal breath sounds.  Abdominal:     General: Bowel sounds are normal.     Palpations: Abdomen is soft.  Skin:    General: Skin is warm and dry.     Capillary Refill: Capillary refill takes less than 2 seconds.  Neurological:     Mental Status: She is alert and oriented to person, place, and time.     Comments: Cranial nerves II through XII grossly intact.  Endorses some unsteadiness with Romberg but ultimately will stand with assistance which is her baseline.  Alert and oriented x3.  Moves all 4 limbs spontaneously, normal coordination.  No pronator drift.  Intact strength 5 out of  5 bilateral upper and lower extremities.   Psychiatric:        Mood and Affect: Mood normal.        Behavior: Behavior normal.     ED Results / Procedures / Treatments   Labs (all labs ordered are listed, but only abnormal results are displayed) Labs Reviewed  COMPREHENSIVE METABOLIC PANEL WITH GFR - Abnormal; Notable for the following components:      Result Value   Potassium 3.3 (*)    Glucose, Bld 145 (*)    Total Protein 6.3 (*)    Alkaline Phosphatase 32 (*)    All other components within normal limits  URINALYSIS, ROUTINE W REFLEX MICROSCOPIC - Abnormal; Notable for the following components:   APPearance HAZY (*)    Hgb urine dipstick SMALL (*)    Ketones, ur 20 (*)    Leukocytes,Ua LARGE (*)    Bacteria, UA RARE (*)     Non Squamous Epithelial 0-5 (*)    All other components within normal limits  RESP PANEL BY RT-PCR (RSV, FLU A&B, COVID)  [id number]  URINE CULTURE  CBC  TROPONIN I (HIGH SENSITIVITY)  TROPONIN I (HIGH SENSITIVITY)    EKG EKG Interpretation Date/Time:  Saturday October 15 2023 15:16:34 EDT Ventricular Rate:  72 PR Interval:  166 QRS Duration:  84 QT Interval:  372 QTC Calculation: 407 R Axis:   -46  Text Interpretation: Normal sinus rhythm Left axis deviation Pulmonary disease pattern Abnormal ECG No significant change since last tracing Confirmed by Celesta Coke ([id number]) on 10/15/2023 5:15:46 PM  Radiology DG Chest 2 View Result Date: 10/15/2023 CLINICAL DATA:  Productive cough EXAM: CHEST - 2 VIEW COMPARISON:  06/26/2023 FINDINGS: Frontal and lateral views of the chest demonstrate an unremarkable cardiac silhouette. No acute airspace disease, effusion, or pneumothorax. Moderate left shoulder osteoarthritis. Postsurgical changes at the thoracolumbar junction. IMPRESSION: 1. No acute intrathoracic process. Electronically Signed   By: Bobbye Burrow M.D.   On: 10/15/2023 16:00    Procedures Procedures    Medications Ordered in ED Medications  cefTRIAXone  (ROCEPHIN ) 1 g in sodium chloride  0.9 % 100 mL IVPB (1 g Intravenous New Bag/Given 10/15/23 1731)  sodium chloride  0.9 % bolus 1,000 mL (1,000 mLs Intravenous New Bag/Given 10/15/23 1731)    ED Course/ Medical Decision Making/ A&P Clinical Course as of 10/15/23 1823  Sat Oct 15, 2023  1624 Daughter in room, just came in from [place]  today. Daughter thinks symptoms new for around 2 weeks. Urinary frequency, urgency. Seeming slightly more confused from baseline. Lives alone. Delirium from UTI previously. Didn't eat, drink, having trouble walking unassisted. Concerned for dehydration, generalized weakness.  [CP]    Clinical Course User Index [CP] Nelly Banco, PA-C                                 Medical Decision  Making  This patient is a 88 y.o. female  who presents to the ED for concern of weakness, dysuria.   Differential diagnoses prior to evaluation: The emergent differential diagnosis includes, but is not limited to,  CVA, spinal cord injury, ACS, arrhythmia, syncope, orthostatic hypotension, sepsis, hypoglycemia, hypoxia, electrolyte disturbance, endocrine disorder, anemia, environmental exposure, polypharmacy . This is not an exhaustive differential.   Past Medical History / Co-morbidities / Social History: anemia, hyperlipidemia, blindness, hypertension, chronic back pain, GERD  Additional history: Chart reviewed. Pertinent results include: Reviewed lab work, imaging from  previous emergency department visits, hospitalizations  Physical Exam: Physical exam performed. The pertinent findings include: She is overall well-appearing, mostly stable vital signs, slightly soft blood pressure at 115/59.  She is neurologically intact on exam.  She can stand with assistance which is her baseline although she feels hesitant to do so.  Lab Tests/Imaging studies: I personally interpreted labs/imaging and the pertinent results include: CBC unremarkable, no leukocytosis, CMP with mild hypokalemia potassium 3.3, will orally replete, RVP negative for COVID, flu, RSV, negative troponin x 2, UA with large leukocytes, greater than 50 white blood cells and rare bacteria, consistent with acute urinary tract infection, will send for culture but plan to treat.  Given fluid bolus for ketones in urine and 1 dose of Rocephin  in the ED, will discharge on antibiotics.  I do feel anterior plain film chest x-ray shows no evidence of acute intrathoracic abnormality.  I agree with the radiologist interpretation.  Cardiac monitoring: EKG obtained and interpreted by myself and attending physician which shows: Normal sinus rhythm, no significant change from last tracing   Medications: I ordered medication including Rocephin , fluid  bolus as discussed above.  I have reviewed the patients home medicines and have made adjustments as needed.   Disposition: After consideration of the diagnostic results and the patients response to treatment, I feel that patient stable for discharge with acute cystitis, generalized weakness, no focal neurologic deficits, okay for close outpatient follow-up, return precautions given..   emergency department workup does not suggest an emergent condition requiring admission or immediate intervention beyond what has been performed at this time. The plan is: as above. The patient is safe for discharge and has been instructed to return immediately for worsening symptoms, change in symptoms or any other concerns.  Final Clinical Impression(s) / ED Diagnoses Final diagnoses:  Weakness  Cystitis    Rx / DC Orders ED Discharge Orders          Ordered    ciprofloxacin  (CIPRO ) 500 MG tablet  Every 12 hours        10/15/23 1823              Legrand Puma, Cosimo Diones, PA-C 10/15/23 1823    Kingsley, Victoria K, DO 10/15/23 2104

## 2023-10-15 NOTE — ED Provider Triage Note (Signed)
 Emergency Medicine Provider Triage Evaluation Note  Kathryn Romero , a 88 y.o. female  was evaluated in triage.  Pt complains of multiple complaints -  Generalized weakness, dizziness, lower back pain, increased fatigue, decreased appetite, dribbling urine (1-2 weeks), productive cough (1 day). No CP, shob, fever  Daughter at bedside   Review of Systems  Positive: See hpi Negative:   Physical Exam  BP (!) 115/59 (BP Location: Right Arm)   Pulse 71   Temp 97.9 F (36.6 C)   Resp 16   Ht 5\' 1"  (1.549 m)   SpO2 97%   BMI 31.66 kg/m  Gen:   Awake, no distress   Resp:  Normal effort  MSK:   Moves extremities without difficulty  Other:  No abd TTP  Medical Decision Making  Medically screening exam initiated at 2:48 PM.  Appropriate orders placed.  Kathryn Romero was informed that the remainder of the evaluation will be completed by another provider, this initial triage assessment does not replace that evaluation, and the importance of remaining in the ED until their evaluation is complete.   Kathryn Cords, PA 10/15/23 [phone]

## 2023-10-15 NOTE — ED Notes (Signed)
 Micro to add on urine culture

## 2023-10-17 DIAGNOSIS — H548 Legal blindness, as defined in USA: Secondary | ICD-10-CM | POA: Diagnosis not present

## 2023-10-17 DIAGNOSIS — Z1339 Encounter for screening examination for other mental health and behavioral disorders: Secondary | ICD-10-CM | POA: Diagnosis not present

## 2023-10-17 DIAGNOSIS — K219 Gastro-esophageal reflux disease without esophagitis: Secondary | ICD-10-CM | POA: Diagnosis not present

## 2023-10-17 DIAGNOSIS — M48061 Spinal stenosis, lumbar region without neurogenic claudication: Secondary | ICD-10-CM | POA: Diagnosis not present

## 2023-10-17 DIAGNOSIS — Z1331 Encounter for screening for depression: Secondary | ICD-10-CM | POA: Diagnosis not present

## 2023-10-17 DIAGNOSIS — E785 Hyperlipidemia, unspecified: Secondary | ICD-10-CM | POA: Diagnosis not present

## 2023-10-17 DIAGNOSIS — Z66 Do not resuscitate: Secondary | ICD-10-CM | POA: Diagnosis not present

## 2023-10-17 DIAGNOSIS — N3000 Acute cystitis without hematuria: Secondary | ICD-10-CM | POA: Diagnosis not present

## 2023-10-17 DIAGNOSIS — I10 Essential (primary) hypertension: Secondary | ICD-10-CM | POA: Diagnosis not present

## 2023-10-17 DIAGNOSIS — R2689 Other abnormalities of gait and mobility: Secondary | ICD-10-CM | POA: Diagnosis not present

## 2023-10-17 DIAGNOSIS — Z0000 Encounter for general adult medical examination without abnormal findings: Secondary | ICD-10-CM | POA: Diagnosis not present

## 2023-10-17 LAB — URINE CULTURE

## 2023-10-18 ENCOUNTER — Telehealth (HOSPITAL_BASED_OUTPATIENT_CLINIC_OR_DEPARTMENT_OTHER): Payer: Self-pay | Admitting: *Deleted

## 2023-10-18 NOTE — Progress Notes (Signed)
 ED Antimicrobial Stewardship Positive Culture Follow Up   Kathryn Romero is an 88 y.o. female who presented to [facility] on 10/15/2023 with a chief complaint of  Chief Complaint  Patient presents with   Weakness   Altered Mental Status    Recent Results (from the past 720 hours)  Resp panel by RT-PCR (RSV, Flu A&B, Covid) Anterior Nasal Swab     Status: None   Collection Time: 10/15/23  3:12 PM   Specimen: Anterior Nasal Swab  Result Value Ref Range Status   SARS Coronavirus 2 by RT PCR NEGATIVE NEGATIVE Final   Influenza A by PCR NEGATIVE NEGATIVE Final   Influenza B by PCR NEGATIVE NEGATIVE Final    Comment: (NOTE) The Xpert Xpress SARS-CoV-2/FLU/RSV plus assay is intended as an aid in the diagnosis of influenza from Nasopharyngeal swab specimens and should not be used as a sole basis for treatment. Nasal washings and aspirates are unacceptable for Xpert Xpress SARS-CoV-2/FLU/RSV testing.  Fact Sheet for Patients: BloggerCourse.com  Fact Sheet for Healthcare Providers: SeriousBroker.it  This test is not yet approved or cleared by the [place]  FDA and has been authorized for detection and/or diagnosis of SARS-CoV-2 by FDA under an Emergency Use Authorization (EUA). This EUA will remain in effect (meaning this test can be used) for the duration of the COVID-19 declaration under Section 564(b)(1) of the Act, 21 U.S.C. section 360bbb-3(b)(1), unless the authorization is terminated or revoked.     Resp Syncytial Virus by PCR NEGATIVE NEGATIVE Final    Comment: (NOTE) Fact Sheet for Patients: BloggerCourse.com  Fact Sheet for Healthcare Providers: SeriousBroker.it  This test is not yet approved or cleared by the [place]  FDA and has been authorized for detection and/or diagnosis of SARS-CoV-2 by FDA under an Emergency Use Authorization (EUA). This EUA will  remain in effect (meaning this test can be used) for the duration of the COVID-19 declaration under Section 564(b)(1) of the Act, 21 U.S.C. section 360bbb-3(b)(1), unless the authorization is terminated or revoked.  Performed at [facility], [address] [place], [place] [address]   Urine Culture     Status: Abnormal   Collection Time: 10/15/23  3:12 PM   Specimen: Urine, Clean Catch  Result Value Ref Range Status   Specimen Description URINE, CLEAN CATCH  Final   Special Requests NONE  Final   Culture (A)  Final    >=100,000 COLONIES/mL AEROCOCCUS URINAE Standardized susceptibility testing for this organism is not available. Performed at [facility], [address] [place], [place] [address]    Report Status 10/17/2023 FINAL  Final   88 yo female presented with forgetfulness, dizziness, urinary frequency and urgency. UA showed leukocytes, > 50 WBC, rare bacteria, and 6-10 squamous cells.  Patient was discharged with ciprofloxacin  for suspected UTI. Culture now positive with > 100,000 colonies aerococcus urinae. Would call patient and if still symptomatic, treat with cephalexin  500 mg twice daily for 5 days. Noted history of cephalosporin allergy with reaction listed as nausea. Penicillins and cephalosporins are first line for aerococcus so would counsel on taking with food to reduce nausea with treatment.   New antibiotic prescription: Amoxicillin 500 mg twice daily for 5 days  ED Provider: Dr. Dixie Frederickson 10/18/2023, 10:38 AM Clinical Pharmacist Monday - Friday phone -  [phone] Saturday - Sunday phone - [phone]

## 2023-11-02 DIAGNOSIS — M25511 Pain in right shoulder: Secondary | ICD-10-CM | POA: Diagnosis not present

## 2023-11-02 DIAGNOSIS — M19012 Primary osteoarthritis, left shoulder: Secondary | ICD-10-CM | POA: Diagnosis not present

## 2023-11-12 ENCOUNTER — Other Ambulatory Visit: Payer: Self-pay

## 2023-11-12 ENCOUNTER — Encounter (HOSPITAL_COMMUNITY): Payer: Self-pay

## 2023-11-12 ENCOUNTER — Ambulatory Visit (HOSPITAL_COMMUNITY)
Admission: EM | Admit: 2023-11-12 | Discharge: 2023-11-12 | Disposition: A | Discharge status: Home / Self Care | Attending: Internal Medicine | Admitting: Internal Medicine

## 2023-11-12 DIAGNOSIS — N390 Urinary tract infection, site not specified: Secondary | ICD-10-CM | POA: Diagnosis not present

## 2023-11-12 LAB — COMPREHENSIVE METABOLIC PANEL WITH GFR
ALT: 16 U/L (ref 0–44)
AST: 15 U/L (ref 15–41)
Albumin: 3.8 g/dL (ref 3.5–5.0)
Alkaline Phosphatase: 37 U/L — ABNORMAL LOW (ref 38–126)
Anion gap: 10 (ref 5–15)
BUN: 30 mg/dL — ABNORMAL HIGH (ref 8–23)
CO2: 25 mmol/L (ref 22–32)
Calcium: 9.3 mg/dL (ref 8.9–10.3)
Chloride: 102 mmol/L (ref 98–111)
Creatinine, Ser: 0.77 mg/dL (ref 0.44–1.00)
GFR, Estimated: >60 mL/min (ref >60)
Glucose, Bld: 99 mg/dL (ref 70–99)
Potassium: 3.5 mmol/L (ref 3.5–5.1)
Sodium: 137 mmol/L (ref 135–145)
Total Bilirubin: 1.1 mg/dL (ref 0.0–1.2)
Total Protein: 6.5 g/dL (ref 6.5–8.1)

## 2023-11-12 LAB — POCT URINALYSIS DIP (MANUAL ENTRY)
Bilirubin, UA: NEGATIVE
Glucose, UA: NEGATIVE mg/dL
Ketones, POC UA: NEGATIVE mg/dL
Nitrite, UA: NEGATIVE
Protein Ur, POC: NEGATIVE mg/dL
Spec Grav, UA: >=1.03 — AB (ref 1.010–1.025)
Urobilinogen, UA: 0.2 U/dL
pH, UA: 6 (ref 5.0–8.0)

## 2023-11-12 LAB — CBC WITH DIFFERENTIAL/PLATELET
Abs Immature Granulocytes: 0.03 K/uL (ref 0.00–0.07)
Basophils Absolute: 0 K/uL (ref 0.0–0.1)
Basophils Relative: 0 %
Eosinophils Absolute: 0 K/uL (ref 0.0–0.5)
Eosinophils Relative: 1 %
HCT: 37.2 % (ref 36.0–46.0)
Hemoglobin: 12.5 g/dL (ref 12.0–15.0)
Immature Granulocytes: 1 %
Lymphocytes Relative: 23 %
Lymphs Abs: 1.5 K/uL (ref 0.7–4.0)
MCH: 31.3 pg (ref 26.0–34.0)
MCHC: 33.6 g/dL (ref 30.0–36.0)
MCV: 93.2 fL (ref 80.0–100.0)
Monocytes Absolute: 0.5 K/uL (ref 0.1–1.0)
Monocytes Relative: 8 %
Neutro Abs: 4.3 K/uL (ref 1.7–7.7)
Neutrophils Relative %: 67 %
Platelets: 167 K/uL (ref 150–400)
RBC: 3.99 MIL/uL (ref 3.87–5.11)
RDW: 13 % (ref 11.5–15.5)
WBC: 6.4 K/uL (ref 4.0–10.5)
nRBC: 0 % (ref 0.0–0.2)

## 2023-11-12 MED ORDER — CEPHALEXIN 250 MG PO CAPS: 250.0000 mg | ORAL_CAPSULE | Freq: Two times a day (BID) | ORAL | 0 refills | Status: AC

## 2023-11-12 NOTE — ED Provider Notes (Signed)
 [facility]    CSN: [id number] Arrival date & time: 11/12/23  1153      History   Chief Complaint Chief Complaint  Patient presents with  . Urinary Frequency    HPI Kathryn Romero is a 88 y.o. female.   Kathryn Romero is a 88 y.o. female presenting with her daughter who contributes to the history for chief complaint of urinary frequency, urinary urgency, and increased confusion over the last 3 to 4 days.  Daughter states patient has been increasingly confused over the last 3 to 4 days and this usually happens when she has a urinary tract infection.  History of recurrent UTIs, she has not seen urology.  Most recently treated for acute UTI in the emergency department on October 13, 2023 where urine culture grew Aerococcus species.  She was placed on ciprofloxacin  4 weeks ago and took this as prescribed.  Blood work in the ER was unremarkable.  Symptoms improved significantly after taking ciprofloxacin  and she tolerated this well without side effects.  Urinary frequency and urgency has increased over the last 2 to 3 days.  Denies recent falls, headaches, fever, chills, new low back pain, flank pain, nausea, vomiting, and abdominal pain.  No history of diabetes.  Drinks approximately 2-3 large glasses of water per day.  Denies frequent intake of urinary irritants.  She has not attempted use of any over-the-counter medications to elbow symptoms PTA.   Urinary Frequency    Past Medical History:  Diagnosis Date  . ANEMIA-NOS   . ANXIETY   . Aortic atherosclerosis (HCC)   . Arthritis   . Cancer [facility]) 2005   colon cancer  . Chronic back pain    spondylolisthesis  . Diverticulosis   . Fatty liver   . GERD (gastroesophageal reflux disease)   . History of blood transfusion    no abnormal reaction noted  . History of colon cancer 2004  . History of shingles   . HYPERLIPIDEMIA    takes Pravastatin  daily  . Hypertension   . Legally blind   . Nausea & vomiting 02/20/2022   . Nocturia   . RETINITIS PIGMENTOSA   . SYNDROME, CARPAL TUNNEL   . Weakness    numbness and tingling in legs and feet    Patient Active Problem List   Diagnosis Date Noted  . Failure to thrive (child) 06/27/2023  . Adult wellness visit 06/20/2023  . Gastroenteritis 06/20/2023  . Diarrhea 02/22/2022  . Abdominal pain 02/22/2022  . Nausea & vomiting 02/20/2022  . Hypokalemia 02/20/2022  . Legal blindness 02/20/2022  . Degenerative lumbar spinal stenosis 01/06/2018  . Spondylolisthesis at L4-L5 level 11/14/2015  . DOE (dyspnea on exertion) 10/14/2014  . Generalized weakness 09/19/2014  . UTI (urinary tract infection) 09/19/2014  . Obese 05/20/2014  . Nausea with vomiting 04/16/2009  . RETINITIS PIGMENTOSA 12/04/2008  . GLUCOSE INTOLERANCE 05/09/2007  . Dyslipidemia 05/09/2007  . ANEMIA-NOS 05/09/2007  . ANXIETY 05/09/2007  . Essential hypertension 05/09/2007  . DIVERTICULOSIS, COLON 05/09/2007  . BACK PAIN 05/09/2007  . History of malignant neoplasm of large intestine 05/09/2007  . ARTHRITIS, TRAUMATIC, UNSPECIFIED SITE 12/17/2006    Past Surgical History:  Procedure Laterality Date  . ABDOMINAL HYSTERECTOMY  1989  . ANTERIOR LAT LUMBAR FUSION Right 01/06/2018   Procedure: Right Lumbar Two-Three Anterolateral lumbar interbody fusion with lateral plate;  Surgeon: Unice Pac, MD;  Location: [facility] OR;  Service: Neurosurgery;  Laterality: Right;  Right Lumbar Two-Three Anterolateral lumbar interbody fusion with lateral  plate  . APPENDECTOMY  1946  . carapl tunnel release Left   . cataract surgery Bilateral   . COLONOSCOPY    . MAXIMUM ACCESS (MAS)POSTERIOR LUMBAR INTERBODY FUSION (PLIF) 2 LEVEL N/A 11/14/2015   Procedure: Lumbar Four-Five Maximum access posterior lumbar interbody fusion, Lumbar Three-Four Lumbar Four-Five Posterolateral Fusion and Pedicle Screws;  Surgeon: Fairy Levels, MD;  Location: [facility];  Service: Neurosurgery;  Laterality: N/A;  L3-4 L4-5 Maximum  access posterior lumbar interbody fusion  . OOPHORECTOMY  1989  . s/p ganglion cyst  1973  . s/p right hemicolectomy  12 yrs ago    OB History   No obstetric history on file.      Home Medications    Prior to Admission medications   Medication Sig Start Date End Date Taking? Authorizing Provider  ciprofloxacin  (CIPRO ) 500 MG tablet Take 1 tablet (500 mg total) by mouth every 12 (twelve) hours. 10/15/23   Prosperi, Christian H, PA-C  ondansetron  (ZOFRAN ) 4 MG tablet Take 1 tablet (4 mg total) by mouth every 8 (eight) hours as needed for nausea or vomiting. 05/11/23   Ward, Harlene PEDLAR, PA-C  OVER THE COUNTER MEDICATION Take 2 tablets by mouth at bedtime. OTC pain med    [provider]  pantoprazole  (PROTONIX ) 40 MG tablet Take 40 mg by mouth daily. 05/24/23   [provider]  psyllium (HYDROCIL/METAMUCIL) 95 % PACK Take 1 packet by mouth daily.    [provider]  rosuvastatin  (CRESTOR ) 5 MG tablet Take 5 mg by mouth daily.    [provider]    Family History Family History  Problem Relation Age of Onset  . Breast cancer Maternal Aunt   . ALS Cousin   . Polymyalgia rheumatica Cousin   . Colon cancer Neg Hx     Social History Social History   Tobacco Use  . Smoking status: Never  . Smokeless tobacco: Never  Vaping Use  . Vaping status: Never Used  Substance Use Topics  . Alcohol  use: No    Alcohol /week: 0.0 standard drinks of alcohol   . Drug use: No     Allergies   Codeine, Hydromet [hydrocodone  bit-homatrop mbr], Fish allergy, Cephalexin , Morphine, and Sulfa antibiotics   Review of Systems Review of Systems  Genitourinary:  Positive for frequency.     Physical Exam Triage Vital Signs ED Triage Vitals  Encounter Vitals Group     BP 11/12/23 1305 (!) 145/74     Girls Systolic BP Percentile --      Girls Diastolic BP Percentile --      Boys Systolic BP Percentile --      Boys Diastolic BP Percentile --      Pulse Rate  11/12/23 1305 (!) 56     Resp 11/12/23 1305 18     Temp 11/12/23 1305 98.3 F (36.8 C)     Temp Source 11/12/23 1305 Oral     SpO2 11/12/23 1305 96 %     Weight --      Height --      Head Circumference --      Peak Flow --      Pain Score 11/12/23 1303 0     Pain Loc --      Pain Education --      Exclude from Growth Chart --    No data found.  Updated Vital Signs BP (!) 145/74 (BP Location: Left Arm)   Pulse (!) 56   Temp 98.3 F (  36.8 C) (Oral)   Resp 18   SpO2 96%   Visual Acuity Right Eye Distance:   Left Eye Distance:   Bilateral Distance:    Right Eye Near:   Left Eye Near:    Bilateral Near:     Physical Exam   UC Treatments / Results  Labs (all labs ordered are listed, but only abnormal results are displayed) Labs Reviewed  POCT URINALYSIS DIP (MANUAL ENTRY) - Abnormal; Notable for the following components:      Result Value   Color, UA brown (*)    Clarity, UA cloudy (*)    Spec Grav, UA >=1.030 (*)    Blood, UA small (*)    Leukocytes, UA Trace (*)    All other components within normal limits    EKG   Radiology No results found.  Procedures Procedures (including critical care time)  Medications Ordered in UC Medications - No data to display  Initial Impression / Assessment and Plan / UC Course  I have reviewed the triage vital signs and the nursing notes.  Pertinent labs & imaging results that were available during my care of the patient were reviewed by me and considered in my medical decision making (see chart for details).    Beryl- friend/daughter [phone]  *** Final Clinical Impressions(s) / UC Diagnoses   Final diagnoses:  None   Discharge Instructions   None    ED Prescriptions   None    PDMP not reviewed this encounter.

## 2023-11-12 NOTE — ED Triage Notes (Signed)
 Pt states that she has had urinary problems for the past 91mths-27yr. Pt states she is having the urge to go and when she does its just a few drops. Pt states she is having to go frequently. Pt states she has been seen for this before multiple times and given abx. Pts daughter also states that her memory is not very good here lately. Pt denies any pain at this time. Pt denies fever.

## 2023-11-12 NOTE — Discharge Instructions (Signed)
 Take antibiotic every 12 hours for the next 7 days. Your blood work will come back in the next 24 to 48 hours and staff will call you if your blood work is abnormal. Please schedule a follow-up appointment with the urologist listed on your paperwork.  A referral has been placed.  You may also need to follow-up with your PCP to obtain a referral instead of a referral from urgent care as this may be referred by [facility]

## 2023-11-13 ENCOUNTER — Ambulatory Visit (HOSPITAL_COMMUNITY): Payer: Self-pay | Admitting: Internal Medicine

## 2023-11-13 LAB — URINE CULTURE: Culture: <10000 — AB

## 2023-12-07 ENCOUNTER — Emergency Department (HOSPITAL_COMMUNITY)

## 2023-12-07 ENCOUNTER — Observation Stay (HOSPITAL_COMMUNITY)
Admission: EM | Admit: 2023-12-07 | Discharge: 2023-12-09 | Disposition: A | Discharge status: Home / Self Care | Attending: Internal Medicine | Admitting: Internal Medicine

## 2023-12-07 ENCOUNTER — Other Ambulatory Visit: Payer: Self-pay

## 2023-12-07 DIAGNOSIS — I10 Essential (primary) hypertension: Secondary | ICD-10-CM | POA: Diagnosis not present

## 2023-12-07 DIAGNOSIS — N390 Urinary tract infection, site not specified: Secondary | ICD-10-CM | POA: Diagnosis not present

## 2023-12-07 DIAGNOSIS — E785 Hyperlipidemia, unspecified: Secondary | ICD-10-CM | POA: Diagnosis not present

## 2023-12-07 DIAGNOSIS — R10816 Epigastric abdominal tenderness: Secondary | ICD-10-CM | POA: Diagnosis not present

## 2023-12-07 DIAGNOSIS — R42 Dizziness and giddiness: Secondary | ICD-10-CM | POA: Diagnosis not present

## 2023-12-07 DIAGNOSIS — R32 Unspecified urinary incontinence: Secondary | ICD-10-CM

## 2023-12-07 DIAGNOSIS — R531 Weakness: Secondary | ICD-10-CM | POA: Diagnosis not present

## 2023-12-07 DIAGNOSIS — H548 Legal blindness, as defined in USA: Secondary | ICD-10-CM | POA: Diagnosis not present

## 2023-12-07 DIAGNOSIS — E876 Hypokalemia: Secondary | ICD-10-CM | POA: Diagnosis not present

## 2023-12-07 DIAGNOSIS — Z981 Arthrodesis status: Secondary | ICD-10-CM | POA: Diagnosis not present

## 2023-12-07 DIAGNOSIS — I771 Stricture of artery: Secondary | ICD-10-CM | POA: Diagnosis not present

## 2023-12-07 DIAGNOSIS — H9190 Unspecified hearing loss, unspecified ear: Secondary | ICD-10-CM | POA: Diagnosis not present

## 2023-12-07 DIAGNOSIS — R300 Dysuria: Secondary | ICD-10-CM | POA: Diagnosis not present

## 2023-12-07 DIAGNOSIS — Z85038 Personal history of other malignant neoplasm of large intestine: Secondary | ICD-10-CM | POA: Diagnosis not present

## 2023-12-07 DIAGNOSIS — R2681 Unsteadiness on feet: Secondary | ICD-10-CM | POA: Diagnosis not present

## 2023-12-07 DIAGNOSIS — D509 Iron deficiency anemia, unspecified: Secondary | ICD-10-CM | POA: Insufficient documentation

## 2023-12-07 DIAGNOSIS — Z9889 Other specified postprocedural states: Secondary | ICD-10-CM | POA: Diagnosis not present

## 2023-12-07 DIAGNOSIS — R112 Nausea with vomiting, unspecified: Secondary | ICD-10-CM | POA: Diagnosis not present

## 2023-12-07 DIAGNOSIS — R4182 Altered mental status, unspecified: Principal | ICD-10-CM

## 2023-12-07 DIAGNOSIS — G8929 Other chronic pain: Secondary | ICD-10-CM | POA: Insufficient documentation

## 2023-12-07 DIAGNOSIS — N3001 Acute cystitis with hematuria: Secondary | ICD-10-CM

## 2023-12-07 DIAGNOSIS — G9341 Metabolic encephalopathy: Secondary | ICD-10-CM | POA: Diagnosis not present

## 2023-12-07 LAB — CBC WITH DIFFERENTIAL/PLATELET
Abs Immature Granulocytes: 0.07 K/uL (ref 0.00–0.07)
Basophils Absolute: 0 K/uL (ref 0.0–0.1)
Basophils Relative: 0 %
Eosinophils Absolute: 0 K/uL (ref 0.0–0.5)
Eosinophils Relative: 0 %
HCT: 39.8 % (ref 36.0–46.0)
Hemoglobin: 13 g/dL (ref 12.0–15.0)
Immature Granulocytes: 1 %
Lymphocytes Relative: 25 %
Lymphs Abs: 1.5 K/uL (ref 0.7–4.0)
MCH: 31.3 pg (ref 26.0–34.0)
MCHC: 32.7 g/dL (ref 30.0–36.0)
MCV: 95.7 fL (ref 80.0–100.0)
Monocytes Absolute: 0.3 K/uL (ref 0.1–1.0)
Monocytes Relative: 6 %
Neutro Abs: 4 K/uL (ref 1.7–7.7)
Neutrophils Relative %: 68 %
Platelets: 152 K/uL (ref 150–400)
RBC: 4.16 MIL/uL (ref 3.87–5.11)
RDW: 13.4 % (ref 11.5–15.5)
WBC: 5.9 K/uL (ref 4.0–10.5)
nRBC: 0 % (ref 0.0–0.2)

## 2023-12-07 LAB — PHOSPHORUS: Phosphorus: 3.7 mg/dL (ref 2.5–4.6)

## 2023-12-07 LAB — URINALYSIS, ROUTINE W REFLEX MICROSCOPIC
Bilirubin Urine: NEGATIVE
Glucose, UA: NEGATIVE mg/dL
Hgb urine dipstick: NEGATIVE
Ketones, ur: 5 mg/dL — AB
Nitrite: NEGATIVE
Protein, ur: 100 mg/dL — AB
Specific Gravity, Urine: 1.016 (ref 1.005–1.030)
pH: 6 (ref 5.0–8.0)

## 2023-12-07 LAB — COMPREHENSIVE METABOLIC PANEL WITH GFR
ALT: 19 U/L (ref 0–44)
AST: 18 U/L (ref 15–41)
Albumin: 4 g/dL (ref 3.5–5.0)
Alkaline Phosphatase: 35 U/L — ABNORMAL LOW (ref 38–126)
Anion gap: 15 (ref 5–15)
BUN: 17 mg/dL (ref 8–23)
CO2: 25 mmol/L (ref 22–32)
Calcium: 9.5 mg/dL (ref 8.9–10.3)
Chloride: 98 mmol/L (ref 98–111)
Creatinine, Ser: 0.62 mg/dL (ref 0.44–1.00)
GFR, Estimated: >60 mL/min (ref >60)
Glucose, Bld: 119 mg/dL — ABNORMAL HIGH (ref 70–99)
Potassium: 3.3 mmol/L — ABNORMAL LOW (ref 3.5–5.1)
Sodium: 138 mmol/L (ref 135–145)
Total Bilirubin: 1.2 mg/dL (ref 0.0–1.2)
Total Protein: 7.2 g/dL (ref 6.5–8.1)

## 2023-12-07 LAB — MAGNESIUM: Magnesium: 2.2 mg/dL (ref 1.7–2.4)

## 2023-12-07 LAB — LIPASE, BLOOD: Lipase: 30 U/L (ref 11–51)

## 2023-12-07 LAB — AMMONIA: Ammonia: 14 umol/L (ref 9–35)

## 2023-12-07 MED ORDER — ONDANSETRON HCL 4 MG PO TABS: 4.0000 mg | ORAL_TABLET | Freq: Four times a day (QID) | ORAL | Status: DC | PRN

## 2023-12-07 MED ORDER — ACETAMINOPHEN 650 MG RE SUPP
650.0000 mg | Freq: Four times a day (QID) | RECTAL | Status: DC | PRN
Start: 2023-12-07 — End: 2023-12-09

## 2023-12-07 MED ORDER — ACETAMINOPHEN 500 MG PO TABS
1000.0000 mg | ORAL_TABLET | Freq: Once | ORAL | Status: AC
  Administered 2023-12-07: 1000 mg via ORAL
  Filled 2023-12-07: qty 2

## 2023-12-07 MED ORDER — SODIUM CHLORIDE 0.9 % IV SOLN: INTRAVENOUS | Status: DC

## 2023-12-07 MED ORDER — SODIUM CHLORIDE 0.9 % IV SOLN
1.0000 g | Freq: Once | INTRAVENOUS | Status: AC
  Administered 2023-12-07: 1 g via INTRAVENOUS
  Filled 2023-12-07: qty 10

## 2023-12-07 MED ORDER — ACETAMINOPHEN 325 MG PO TABS: 650.0000 mg | ORAL_TABLET | Freq: Four times a day (QID) | ORAL | Status: DC | PRN

## 2023-12-07 MED ORDER — SODIUM CHLORIDE 0.9 % IV SOLN: 1.0000 g | INTRAVENOUS | Status: DC

## 2023-12-07 MED ORDER — LACTATED RINGERS IV BOLUS
500.0000 mL | Freq: Once | INTRAVENOUS | Status: AC
  Administered 2023-12-07: 500 mL via INTRAVENOUS

## 2023-12-07 MED ORDER — ROSUVASTATIN CALCIUM 5 MG PO TABS: 5.0000 mg | ORAL_TABLET | Freq: Every day | ORAL | Status: DC

## 2023-12-07 MED ORDER — POTASSIUM CHLORIDE CRYS ER 20 MEQ PO TBCR
20.0000 meq | EXTENDED_RELEASE_TABLET | Freq: Once | ORAL | Status: AC
  Administered 2023-12-07: 20 meq via ORAL
  Filled 2023-12-07: qty 1

## 2023-12-07 MED ORDER — PANTOPRAZOLE SODIUM 40 MG PO TBEC
40.0000 mg | DELAYED_RELEASE_TABLET | Freq: Every day | ORAL | Status: DC
Start: 2023-12-07 — End: 2023-12-09
  Administered 2023-12-07 – 2023-12-08 (×2): 40 mg via ORAL
  Filled 2023-12-07 (×2): qty 1

## 2023-12-07 MED ORDER — ACETAMINOPHEN 325 MG PO TABS
650.0000 mg | ORAL_TABLET | Freq: Once | ORAL | Status: AC
  Administered 2023-12-07: 650 mg via ORAL
  Filled 2023-12-07: qty 2

## 2023-12-07 MED ORDER — ONDANSETRON HCL 4 MG/2ML IJ SOLN
4.0000 mg | Freq: Four times a day (QID) | INTRAMUSCULAR | Status: DC | PRN
Start: 2023-12-07 — End: 2023-12-09

## 2023-12-07 MED ORDER — ONDANSETRON HCL 4 MG/2ML IJ SOLN
4.0000 mg | Freq: Once | INTRAMUSCULAR | Status: AC
  Administered 2023-12-07: 4 mg via INTRAVENOUS
  Filled 2023-12-07: qty 2

## 2023-12-07 NOTE — ED Provider Notes (Signed)
 I received the patient in signout.  She is here for confusion today.  Has been treated for UTI on outpatient antibiotics but had worsening confusion today was sent to the ER.  The read of her labs and urinalysis are pending at the time of signout.  Physical Exam  BP 130/67   Pulse 73   Temp 97.9 F (36.6 C) (Oral)   Resp 18   SpO2 95%   Physical Exam General: No acute distress  Procedures  Procedures  ED Course / MDM   Clinical Course as of 12/07/23 2024  Wed Dec 07, 2023  1633 Head CT without acute abnormality [JK]  1634 X-ray without acute abnormality [JK]    Clinical Course User Index [JK] Randol Simmonds, MD   Medical Decision Making Amount and/or Complexity of Data Reviewed Labs: ordered. Radiology: ordered.  Risk OTC drugs. Prescription drug management.   The patient's urinalysis does show findings concerning for UTI.  The patient is given Rocephin .  Given the patient's confusion a call was placed to hospital service for admission.       Ula Prentice SAUNDERS, MD 12/07/23 2025

## 2023-12-07 NOTE — Assessment & Plan Note (Signed)
 Permissive hypertension for today

## 2023-12-07 NOTE — H&P (Addendum)
 Kathryn Romero FMW:[id number] DOB: 03/20/1936 DOA: 12/07/2023    PCP: Janey Santos, MD    Patient arrived to ER on 12/07/23 at 1308 Referred by Attending Kathryn Prentice SAUNDERS, MD   Patient coming from:    home Lives alone,       Chief Complaint:   Chief Complaint  Patient presents with   Altered Mental Status   Dysuria    HPI: Kathryn Romero is a 88 y.o. female with medical history significant of hard of hearing, blind, anemia chronic back pain, fatty liver, history of colon cancer, HLD, HTN    Presented with    Patient lives at home alone she has been somewhat more confused recently.  Patient has chronic pain in her back which has not worsened recently.  She has been having more nausea and had a few episodes of vomiting denies fevers does endorse suprapubic pain and dysuria.  Patient suffers from blindness and hard of hearing.  Denies recent falls She has been treated for UTI in the past months prescribed cephalexin  has not been able to get her prescriptions  Allergic to Cipro     On her good day she is able to take care of her self she has help from friends and help comes 4 h a day. Family is interested in Rehab  Denies significant ETOH intake   Does not smoke   Regarding pertinent Chronic problems:     Hyperlipidemia -  on statins crestor  Lipid Panel     Component Value Date/Time   CHOL 150 11/27/2015 0000   TRIG 132 11/27/2015 0000   HDL 53 11/27/2015 0000   CHOLHDL 4 05/20/2014 0919   VLDL 19.6 05/20/2014 0919   LDLCALC 71 11/27/2015 0000   LDLDIRECT 198.1 08/16/2012 1002      obesity-   BMI Readings from Last 1 Encounters:  10/18/23 31.66 kg/m    While in ER: Clinical Course as of 12/07/23 2101  Wed Dec 07, 2023  1633 Head CT without acute abnormality [JK]  1634 X-ray without acute abnormality [JK]    Clinical Course User Index [JK] Randol Simmonds, MD       Lab Orders         Urine Culture (for pregnant, neutropenic or urologic patients or patients  with an indwelling urinary catheter)         Comprehensive metabolic panel         CBC WITH DIFFERENTIAL         Urinalysis, Routine w reflex microscopic -Urine, Clean Catch         Ammonia         Lipase, blood      CT HEAD Diffuse heterogeneous low attenuation within the cerebral white matter, most likely representing chronic small vessel disease. 3. Prominence of the sulci and ventricles compatible with brain atrophy.   CXR - NON acute   Following Medications were ordered in ER: Medications  cefTRIAXone  (ROCEPHIN ) 1 g in sodium chloride  0.9 % 100 mL IVPB (has no administration in time range)  acetaminophen  (TYLENOL ) tablet 1,000 mg (1,000 mg Oral Given 12/07/23 1512)  ondansetron  (ZOFRAN ) injection 4 mg (4 mg Intravenous Given 12/07/23 1513)  lactated ringers  bolus 500 mL (0 mLs Intravenous Stopped 12/07/23 1734)  acetaminophen  (TYLENOL ) tablet 650 mg (650 mg Oral Given 12/07/23 1955)     ED Triage Vitals  Encounter Vitals Group     BP 12/07/23 1333 (!) 142/79     Girls Systolic BP Percentile --  Girls Diastolic BP Percentile --      Boys Systolic BP Percentile --      Boys Diastolic BP Percentile --      Pulse Rate 12/07/23 1333 75     Resp 12/07/23 1333 17     Temp 12/07/23 1333 98.8 F (37.1 C)     Temp Source 12/07/23 1651 Oral     SpO2 12/07/23 1333 96 %     Weight --      Height --      Head Circumference --      Peak Flow --      Pain Score 12/07/23 1333 7     Pain Loc --      Pain Education --      Exclude from Growth Chart --   UFJK(75)@     _________________________________________ Significant initial  Findings: Abnormal Labs Reviewed  COMPREHENSIVE METABOLIC PANEL WITH GFR - Abnormal; Notable for the following components:      Result Value   Potassium 3.3 (*)    Glucose, Bld 119 (*)    Alkaline Phosphatase 35 (*)    All other components within normal limits  URINALYSIS, ROUTINE W REFLEX MICROSCOPIC - Abnormal; Notable for the following components:    APPearance CLOUDY (*)    Ketones, ur 5 (*)    Protein, ur 100 (*)    Leukocytes,Ua TRACE (*)    Bacteria, UA MANY (*)    All other components within normal limits    ECG: Ordered Personally reviewed and interpreted by me showing: HR : 76 Rhythm: Sinus rhythm Borderline left axis deviation Low voltage, precordial leads QTC 448  The recent clinical data is shown below. Vitals:   12/07/23 1333 12/07/23 1651 12/07/23 2015  BP: (!) 142/79 (!) 153/68 130/67  Pulse: 75 77 73  Resp: 17 13 18   Temp: 98.8 F (37.1 C) 97.9 F (36.6 C)   TempSrc:  Oral   SpO2: 96% 95% 95%   WBC     Component Value Date/Time   WBC 5.9 12/07/2023 1648   LYMPHSABS 1.5 12/07/2023 1648   LYMPHSABS 1.3 03/11/2008 1550   MONOABS 0.3 12/07/2023 1648   MONOABS 0.3 03/11/2008 1550   EOSABS 0.0 12/07/2023 1648   EOSABS 0.1 03/11/2008 1550   BASOSABS 0.0 12/07/2023 1648   BASOSABS 0.0 03/11/2008 1550      UA  evidence of UTI     Urine analysis:    Component Value Date/Time   COLORURINE YELLOW 12/07/2023 1849   APPEARANCEUR CLOUDY (A) 12/07/2023 1849   LABSPEC 1.016 12/07/2023 1849   PHURINE 6.0 12/07/2023 1849   GLUCOSEU NEGATIVE 12/07/2023 1849   GLUCOSEU NEGATIVE 10/09/2014 1414   HGBUR NEGATIVE 12/07/2023 1849   BILIRUBINUR NEGATIVE 12/07/2023 1849   BILIRUBINUR negative 11/12/2023 1325   KETONESUR 5 (A) 12/07/2023 1849   PROTEINUR 100 (A) 12/07/2023 1849   UROBILINOGEN 0.2 11/12/2023 1325   UROBILINOGEN 0.2 10/09/2014 1414   NITRITE NEGATIVE 12/07/2023 1849   LEUKOCYTESUR TRACE (A) 12/07/2023 1849    Results for orders placed or performed during the hospital encounter of 11/12/23  Urine Culture     Status: Abnormal   Collection Time: 11/12/23  4:20 PM   Specimen: Urine, Clean Catch  Result Value Ref Range Status   Specimen Description URINE, CLEAN CATCH  Final   Special Requests NONE  Final   Culture (A)  Final    <10,000 COLONIES/mL INSIGNIFICANT GROWTH Performed at [facility], [address] [place], [place] [address]    Report Status 11/13/2023 FINAL  Final    ABX started rocephin       __________________________________________________________ Recent Labs  Lab 12/07/23 1648  NA 138  K 3.3*  CO2 25  GLUCOSE 119*  BUN 17  CREATININE 0.62  CALCIUM  9.5    Cr   stable,   Lab Results  Component Value Date   CREATININE 0.62 12/07/2023   CREATININE 0.77 11/12/2023   CREATININE 0.83 10/15/2023    Recent Labs  Lab 12/07/23 1648  AST 18  ALT 19  ALKPHOS 35*  BILITOT 1.2  PROT 7.2  ALBUMIN 4.0   Lab Results  Component Value Date   CALCIUM  9.5 12/07/2023   PHOS 3.2 02/21/2022        Plt: Lab Results  Component Value Date   PLT 152 12/07/2023       Recent Labs  Lab 12/07/23 1648  WBC 5.9  NEUTROABS 4.0  HGB 13.0  HCT 39.8  MCV 95.7  PLT 152    HG/HCT   stable,     Component Value Date/Time   HGB 13.0 12/07/2023 1648   HGB 12.3 03/11/2008 1550   HCT 39.8 12/07/2023 1648   HCT 35.8 03/11/2008 1550   MCV 95.7 12/07/2023 1648   MCV 88.4 03/11/2008 1550     Recent Labs  Lab 12/07/23 1648  LIPASE 30   Recent Labs  Lab 12/07/23 1734  AMMONIA 14      _______________________________________________ Hospitalist was called for admission for   Altered mental status  Urinary tract infection    The following Work up has been ordered so far:  Orders Placed This Encounter  Procedures   Urine Culture (for pregnant, neutropenic or urologic patients or patients with an indwelling urinary catheter)   DG Chest 1 View   CT HEAD WO CONTRAST   Comprehensive metabolic panel   CBC WITH DIFFERENTIAL   Urinalysis, Routine w reflex microscopic -Urine, Clean Catch   Ammonia   Lipase, blood   ED Cardiac monitoring   Initiate Carrier Fluid Protocol   In and Out Cath   Consult to hospitalist   ED Pulse oximetry, continuous   ED EKG   ED EKG   EKG 12-Lead   Insert peripheral IV   Insert peripheral IV     OTHER  Significant initial  Findings:  labs showing:     DM  labs:  HbA1C: No results for input(s): HGBA1C in the last 8760 hours.    CBG (last 3)  No results for input(s): GLUCAP in the last 72 hours.     Cultures:    Component Value Date/Time   SDES URINE, CLEAN CATCH 11/12/2023 1620   SPECREQUEST NONE 11/12/2023 1620   CULT (A) 11/12/2023 1620    <10,000 COLONIES/mL INSIGNIFICANT GROWTH Performed at [facility], [address] [place], [place] [address]    REPTSTATUS 11/13/2023 FINAL 11/12/2023 1620     Radiological Exams on Admission: CT HEAD WO CONTRAST Result Date: 12/07/2023 EXAM: CT HEAD WITHOUT 12/07/2023 02:45:32 PM TECHNIQUE: CT of the head was performed without the administration of intravenous contrast. Automated exposure control, iterative reconstruction, and/or weight based adjustment of the mA/kV was utilized to reduce the radiation dose to as low as reasonably achievable. COMPARISON: 07/31/2022 CLINICAL HISTORY: Mental status change, unknown cause. UTI for past month, prescribed cephalexin  med non-compliant. Baseline confusion. Today increased confusion. New onset incontinence. FINDINGS: BRAIN AND VENTRICLES: No acute intracranial hemorrhage. No mass effect  or midline shift. No extra-axial fluid collection. Gray-white differentiation is maintained. No hydrocephalus. Diffuse heterogeneous low attenuation within the cerebral white matter, most likely representing chronic small vessel disease. Prominence of the sulci and ventricles compatible with brain atrophy. ORBITS: No acute abnormality. SINUSES AND MASTOIDS: No acute abnormality. SOFT TISSUES AND SKULL: No acute skull fracture. No acute soft tissue abnormality. IMPRESSION: 1. No acute intracranial abnormality. 2. Diffuse heterogeneous low attenuation within the cerebral white matter, most likely representing chronic small vessel disease. 3. Prominence of the sulci and ventricles compatible with brain atrophy.  Electronically signed by: Waddell Calk MD 12/07/2023 03:02 PM EDT RP Workstation: [id number]   DG Chest 1 View Result Date: 12/07/2023 CLINICAL DATA:  ALTERED MENTAL STATUS, DYSURIA EXAM: CHEST  1 VIEW COMPARISON:  October 15, 2023 FINDINGS: No focal airspace consolidation, pleural effusion, or pneumothorax. Mild cardiomegaly. Tortuous aorta with aortic atherosclerosis. No acute fracture or destructive lesion. Partially visualized lumbar fusion hardware. Multilevel thoracic osteophytosis. IMPRESSION: No acute cardiopulmonary abnormality. Electronically Signed   By: Rogelia Myers M.D.   On: 12/07/2023 14:16   _______________________________________________________________________________________________________ Latest  Blood pressure 130/67, pulse 73, temperature 97.9 F (36.6 C), temperature source Oral, resp. rate 18, SpO2 95%.   Vitals  labs and radiology finding personally reviewed  Review of Systems:    Pertinent positives include:   fatigue,dysuria  Constitutional:  No weight loss, night sweats, Fevers, chills, weight loss  HEENT:  No headaches, Difficulty swallowing,Tooth/dental problems,Sore throat,  No sneezing, itching, ear ache, nasal congestion, post nasal drip,  Cardio-vascular:  No chest pain, Orthopnea, PND, anasarca, dizziness, palpitations.no Bilateral lower extremity swelling  GI:  No heartburn, indigestion, abdominal pain, nausea, vomiting, diarrhea, change in bowel habits, loss of appetite, melena, blood in stool, hematemesis Resp:  no shortness of breath at rest. No dyspnea on exertion, No excess mucus, no productive cough, No non-productive cough, No coughing up of blood.No change in color of mucus.No wheezing. Skin:  no rash or lesions. No jaundice GU:  no , change in color of urine, no urgency or frequency. No straining to urinate.  No flank pain.  Musculoskeletal:  No joint pain or no joint swelling. No decreased range of motion. No back pain.  Psych:  No  change in mood or affect. No depression or anxiety. No memory loss.  Neuro: no localizing neurological complaints, no tingling, no weakness, no double vision, no gait abnormality, no slurred speech, no confusion  All systems reviewed and apart from HOPI all are negative _______________________________________________________________________________________________ Past Medical History:   Past Medical History:  Diagnosis Date   ANEMIA-NOS    ANXIETY    Aortic atherosclerosis (HCC)    Arthritis    Cancer (HCC) 2005   colon cancer   Chronic back pain    spondylolisthesis   Diverticulosis    Fatty liver    GERD (gastroesophageal reflux disease)    History of blood transfusion    no abnormal reaction noted   History of colon cancer 2004   History of shingles    HYPERLIPIDEMIA    takes Pravastatin  daily   Hypertension    Legally blind    Nausea & vomiting 02/20/2022   Nocturia    RETINITIS PIGMENTOSA    SYNDROME, CARPAL TUNNEL    Weakness    numbness and tingling in legs and feet     Past Surgical History:  Procedure Laterality Date   ABDOMINAL HYSTERECTOMY  1989   ANTERIOR LAT LUMBAR FUSION Right 01/06/2018   Procedure: Right  Lumbar Two-Three Anterolateral lumbar interbody fusion with lateral plate;  Surgeon: Unice Pac, MD;  Location: [facility] OR;  Service: Neurosurgery;  Laterality: Right;  Right Lumbar Two-Three Anterolateral lumbar interbody fusion with lateral plate   APPENDECTOMY  1946   carapl tunnel release Left    cataract surgery Bilateral    COLONOSCOPY     MAXIMUM ACCESS (MAS)POSTERIOR LUMBAR INTERBODY FUSION (PLIF) 2 LEVEL N/A 11/14/2015   Procedure: Lumbar Four-Five Maximum access posterior lumbar interbody fusion, Lumbar Three-Four Lumbar Four-Five Posterolateral Fusion and Pedicle Screws;  Surgeon: Pac Unice, MD;  Location: [facility];  Service: Neurosurgery;  Laterality: N/A;  L3-4 L4-5 Maximum access posterior lumbar interbody fusion   OOPHORECTOMY  1989    s/p ganglion cyst  1973   s/p right hemicolectomy  12 yrs ago    Social History:  Ambulatory  walker    reports that she has never smoked. She has never used smokeless tobacco. She reports that she does not drink alcohol  and does not use drugs.   Family History:   Family History  Problem Relation Age of Onset   Breast cancer Maternal Aunt    ALS Cousin    Polymyalgia rheumatica Cousin    Colon cancer Neg Hx    ______________________________________________________________________________________________ Allergies: Allergies  Allergen Reactions   Codeine Shortness Of Breath   Hydromet [Hydrocodone  Bit-Homatrop Mbr] Shortness Of Breath and Other (See Comments)    Wheezing   Fish Allergy Other (See Comments)    Burning Sensation and Headache   Cephalexin  Nausea Only   Morphine Other (See Comments)    feels funny   Sulfa Antibiotics Nausea And Vomiting          Prior to Admission medications   Medication Sig Start Date End Date Taking? Authorizing Provider  ciprofloxacin  (CIPRO ) 500 MG tablet Take 1 tablet (500 mg total) by mouth every 12 (twelve) hours. 10/15/23   Prosperi, Christian H, PA-C  ondansetron  (ZOFRAN ) 4 MG tablet Take 1 tablet (4 mg total) by mouth every 8 (eight) hours as needed for nausea or vomiting. 05/11/23   Ward, Harlene PEDLAR, PA-C  OVER THE COUNTER MEDICATION Take 2 tablets by mouth at bedtime. OTC pain med    [provider]  pantoprazole  (PROTONIX ) 40 MG tablet Take 40 mg by mouth daily. 05/24/23   [provider]  psyllium (HYDROCIL/METAMUCIL) 95 % PACK Take 1 packet by mouth daily.    [provider]  rosuvastatin  (CRESTOR ) 5 MG tablet Take 5 mg by mouth daily.    [provider]    ___________________________________________________________________________________________________ Physical Exam:    12/07/2023    8:15 PM 12/07/2023    4:51 PM 12/07/2023    1:33 PM  Vitals with BMI  Systolic 130 153 857  Diastolic 67  68 79  Pulse 73 77 75    1. General:  in No  Acute distress    Chronically ill  -appearing 2. Psychological: Alert and   Oriented 3. Head/ENT:    Dry Mucous Membranes                          Head Non traumatic, neck supple                           Poor Dentition 4. SKIN:  decreased Skin turgor,  Skin clean Dry and intact no rash    5. Heart: Regular rate and rhythm no  Murmur, no Rub or gallop 6. Lungs:  no wheezes or crackles   7. Abdomen: Soft,  non-tender, Non distended   obese  bowel sounds present 8. Lower extremities: no clubbing, cyanosis, no  edema 9. Neurologically Grossly intact, moving all 4 extremities equally   10. MSK: Normal range of motion    Chart has been reviewed  ______________________________________________________________________________________________  Assessment/Plan 88 y.o. female with medical history significant of hard of hearing, blind, anemia chronic back pain, fatty liver, history of colon cancer, HLD, HTN    Admitted for acute encephalopathy Urinary tract infection     Present on Admission:  Hypokalemia  Dyslipidemia  Essential hypertension  UTI (urinary tract infection)  Acute metabolic encephalopathy    Dyslipidemia Chronic stable continue Crestor  5 mg a day  Essential hypertension Permissive hypertension for today  Hypokalemia Will replace and recheck  UTI (urinary tract infection)  - treat with Rocephin          await results of urine culture and adjust antibiotic coverage as needed   Acute metabolic encephalopathy   - most likely multifactorial secondary to combination of  infection   mild dehydration secondary to decreased by mouth intake,     - Will rehydrate   - treat underlining infection    -  improvement in Er noted  - neurological exam appears to be nonfocal      Other plan as per orders.  DVT prophylaxis:  SCD       Code Status:    Code Status: Prior FULL CODE  as per patient   I had personally discussed  CODE STATUS with patient   ACP   none   Family Communication:   Family not at  Bedside  Spoke to daughter on the phone  Diet heart healthy   Disposition Plan:    To home once workup is complete and patient is stable   Following barriers for discharge:                                                       Electrolytes corrected                               able to transition to PO antibiotics                             Will need to be able to tolerate PO                                  Consult Orders  (From admission, onward)           Start     Ordered   12/07/23 2025  Consult to hospitalist  Once       Provider:  (Not yet assigned)  Question Answer Comment  Place call to: [facility]   Reason for Consult Admit      12/07/23 2024                               Would benefit from PT/OT eval prior to DC  Ordered  Consults called:    NONE   Admission status:  ED Disposition     ED Disposition  Admit   Condition  --   Comment  Hospital Area: [facility] [[id number]]  Level of Care: Telemetry [5]  Admit to tele based on following criteria: Other see comments  Comments: hypokalemia  May admit patient to [facility] or [facility] if equivalent level of care is available:: No  Covid Evaluation: Asymptomatic - no recent exposure (last 10 days) testing not required  Diagnosis: Hypokalemia [827819]  Admitting Physician: Naisha Wisdom [[id number]]  Attending Physician: Randen Kauth [[id number]]  Certification:: I certify this patient will need inpatient services for at least 2 midnights  Expected Medical Readiness: 12/09/2023          Obs      inpatient     I Expect 2 midnight stay secondary to severity of patient's current illness need for inpatient interventions justified by the following:     Severe lab/radiological/exam abnormalities including:      Urinary tract infection without  hematuria, site unspecified    and extensive comorbidities including: dementia    I expect  patient to be hospitalized for 2 midnights requiring inpatient medical care.  Patient is at high risk for adverse outcome (such as loss of life or disability) if not treated.  Indication for inpatient stay as follows:   Need for IV antibiotics, IV fluids,    Level of care     tele  For 12H    [employer] [place] 12/07/2023, 9:40 PM    [facility]     after 2 AM please page floor coverage   If 7AM-7PM, please contact the day team taking care of the patient using [email or web address]

## 2023-12-07 NOTE — ED Triage Notes (Addendum)
 Per EMS from home. UTI for past month, prescribed cephalexin  med non-compliant. Baseline confusion. Today increased confusion. New onset incontinence. Cipro  allergy.  BP 140/72 HR 80 96 on RA CBG 118

## 2023-12-07 NOTE — Assessment & Plan Note (Signed)
Chronic stable continue Crestor 5 mg a day

## 2023-12-07 NOTE — Subjective & Objective (Signed)
 Patient lives at home alone she has been somewhat more confused recently.  Patient has chronic pain in her back which has not worsened recently.  She has been having more nausea and had a few episodes of vomiting denies fevers does endorse suprapubic pain and dysuria.  Patient suffers from blindness and hard of hearing.  Denies recent falls She has been treated for UTI in the past months prescribed cephalexin  has not been able to get her prescriptions  Allergic to Cipro 

## 2023-12-07 NOTE — Assessment & Plan Note (Signed)
-   treat with Rocephin         await results of urine culture and adjust antibiotic coverage as needed  

## 2023-12-07 NOTE — Assessment & Plan Note (Addendum)
-   most likely multifactorial secondary to combination of  infection   mild dehydration secondary to decreased by mouth intake,     - Will rehydrate   - treat underlining infection    -  improvement in Er noted  - neurological exam appears to be nonfocal

## 2023-12-07 NOTE — ED Provider Notes (Signed)
 [place] EMERGENCY DEPARTMENT AT [facility] Provider Note   CSN: [id number] Arrival date & time: 12/07/23  1308        Kathryn Romero is a 88 y.o. female.    Altered Mental Status Dysuria    Patient has a history of chronic back pain diverticulosis, acid reflux, anemia, anxiety, colon cancer.  Patient presents to the ED for evaluation from home.  Reportedly patient has some confusion at baseline.  Over the past month she has been getting treatment for UTI.  Per the EMS report patient started having increasing confusion today and was brought to the ED.  Patient herself initially states that she does not know why she was here.  She did however indicate that she has been having a headache.  She also has had some trouble with abdominal discomfort.  She is not having any fevers or diarrhea but she did report vomiting earlier today  Prior to Admission medications   Medication Sig Start Date End Date Taking? Authorizing Provider  ciprofloxacin  (CIPRO ) 500 MG tablet Take 1 tablet (500 mg total) by mouth every 12 (twelve) hours. 10/15/23   Prosperi, Christian H, PA-C  ondansetron  (ZOFRAN ) 4 MG tablet Take 1 tablet (4 mg total) by mouth every 8 (eight) hours as needed for nausea or vomiting. 05/11/23   Ward, Harlene PEDLAR, PA-C  OVER THE COUNTER MEDICATION Take 2 tablets by mouth at bedtime. OTC pain med    [provider]  pantoprazole  (PROTONIX ) 40 MG tablet Take 40 mg by mouth daily. 05/24/23   [provider]  psyllium (HYDROCIL/METAMUCIL) 95 % PACK Take 1 packet by mouth daily.    [provider]  rosuvastatin  (CRESTOR ) 5 MG tablet Take 5 mg by mouth daily.    [provider]    Allergies: Codeine, Hydromet [hydrocodone  bit-homatrop mbr], Fish allergy, Cephalexin , Morphine, and Sulfa antibiotics    Review of Systems  Genitourinary:  Positive for dysuria.    Updated Vital Signs BP (!) 142/79   Pulse 75   Temp 98.8 F (37.1 C)   Resp 17    SpO2 96%   Physical Exam Vitals and nursing note reviewed.  Constitutional:      Appearance: She is well-developed. She is not diaphoretic.  HENT:     Head: Normocephalic and atraumatic.     Right Ear: External ear normal.     Left Ear: External ear normal.  Eyes:     General: No scleral icterus.       Right eye: No discharge.        Left eye: No discharge.     Conjunctiva/sclera: Conjunctivae normal.  Neck:     Trachea: No tracheal deviation.  Cardiovascular:     Rate and Rhythm: Normal rate and regular rhythm.  Pulmonary:     Effort: Pulmonary effort is normal. No respiratory distress.     Breath sounds: Normal breath sounds. No stridor. No wheezing or rales.  Abdominal:     General: Bowel sounds are normal. There is no distension.     Palpations: Abdomen is soft.     Tenderness: There is abdominal tenderness in the epigastric area. There is no guarding or rebound.  Musculoskeletal:        General: No tenderness or deformity.     Cervical back: Neck supple.  Skin:    General: Skin is warm and dry.     Findings: No rash.  Neurological:     General: No focal deficit present.  Mental Status: She is alert.     GCS: GCS eye subscore is 4. GCS verbal subscore is 4. GCS motor subscore is 6.     Cranial Nerves: No cranial nerve deficit, dysarthria or facial asymmetry.     Sensory: No sensory deficit.     Motor: No abnormal muscle tone or seizure activity.     Coordination: Coordination normal.     Comments: Patient follows commands, answers questions appropriately  Psychiatric:        Mood and Affect: Mood normal.     (all labs ordered are listed, but only abnormal results are displayed) Labs Reviewed  COMPREHENSIVE METABOLIC PANEL WITH GFR  CBC WITH DIFFERENTIAL/PLATELET  URINALYSIS, ROUTINE W REFLEX MICROSCOPIC  AMMONIA    EKG: None  Radiology: CT HEAD WO CONTRAST Result Date: 12/07/2023 EXAM: CT HEAD WITHOUT 12/07/2023 02:45:32 PM TECHNIQUE: CT of the head  was performed without the administration of intravenous contrast. Automated exposure control, iterative reconstruction, and/or weight based adjustment of the mA/kV was utilized to reduce the radiation dose to as low as reasonably achievable. COMPARISON: 07/31/2022 CLINICAL HISTORY: Mental status change, unknown cause. UTI for past month, prescribed cephalexin  med non-compliant. Baseline confusion. Today increased confusion. New onset incontinence. FINDINGS: BRAIN AND VENTRICLES: No acute intracranial hemorrhage. No mass effect or midline shift. No extra-axial fluid collection. Gray-white differentiation is maintained. No hydrocephalus. Diffuse heterogeneous low attenuation within the cerebral white matter, most likely representing chronic small vessel disease. Prominence of the sulci and ventricles compatible with brain atrophy. ORBITS: No acute abnormality. SINUSES AND MASTOIDS: No acute abnormality. SOFT TISSUES AND SKULL: No acute skull fracture. No acute soft tissue abnormality. IMPRESSION: 1. No acute intracranial abnormality. 2. Diffuse heterogeneous low attenuation within the cerebral white matter, most likely representing chronic small vessel disease. 3. Prominence of the sulci and ventricles compatible with brain atrophy. Electronically signed by: Waddell Calk MD 12/07/2023 03:02 PM EDT RP Workstation: [id number]   DG Chest 1 View Result Date: 12/07/2023 CLINICAL DATA:  ALTERED MENTAL STATUS, DYSURIA EXAM: CHEST  1 VIEW COMPARISON:  October 15, 2023 FINDINGS: No focal airspace consolidation, pleural effusion, or pneumothorax. Mild cardiomegaly. Tortuous aorta with aortic atherosclerosis. No acute fracture or destructive lesion. Partially visualized lumbar fusion hardware. Multilevel thoracic osteophytosis. IMPRESSION: No acute cardiopulmonary abnormality. Electronically Signed   By: Rogelia Myers M.D.   On: 12/07/2023 14:16     Procedures   Medications Ordered in the ED  acetaminophen  (TYLENOL )  tablet 1,000 mg (1,000 mg Oral Given 12/07/23 1512)  ondansetron  (ZOFRAN ) injection 4 mg (4 mg Intravenous Given 12/07/23 1513)  lactated ringers  bolus 500 mL (500 mLs Intravenous New Bag/Given 12/07/23 1515)    Clinical Course as of 12/07/23 1652  Wed Dec 07, 2023  1633 Head CT without acute abnormality [JK]  1634 X-ray without acute abnormality [JK]    Clinical Course User Index [JK] Randol Simmonds, MD                                 Medical Decision Making Amount and/or Complexity of Data Reviewed Labs: ordered. Radiology: ordered.  Risk OTC drugs. Prescription drug management.   Patient presented to the ED for evaluation of nausea vomiting possible increased confusion.  Patient also reported some abdominal discomfort to me.  Imaging test do not show any signs of cerebral hemorrhage.  No obvious stroke.  Chest x-ray does not show pneumonia.  Laboratory tests have been ordered.  Still currently pending.  Care turned over to Dr Ula at shift change     Final diagnoses:  None    ED Discharge Orders     None          Randol Simmonds, MD 12/07/23 [phone]

## 2023-12-07 NOTE — Assessment & Plan Note (Signed)
Will replace and recheck 

## 2023-12-08 DIAGNOSIS — E876 Hypokalemia: Secondary | ICD-10-CM | POA: Diagnosis not present

## 2023-12-08 LAB — CK: Total CK: 62 U/L (ref 38–234)

## 2023-12-08 LAB — CBC
HCT: 34.7 % — ABNORMAL LOW (ref 36.0–46.0)
Hemoglobin: 11.2 g/dL — ABNORMAL LOW (ref 12.0–15.0)
MCH: 30.9 pg (ref 26.0–34.0)
MCHC: 32.3 g/dL (ref 30.0–36.0)
MCV: 95.6 fL (ref 80.0–100.0)
Platelets: 127 K/uL — ABNORMAL LOW (ref 150–400)
RBC: 3.63 MIL/uL — ABNORMAL LOW (ref 3.87–5.11)
RDW: 13.4 % (ref 11.5–15.5)
WBC: 4.3 K/uL (ref 4.0–10.5)
nRBC: 0 % (ref 0.0–0.2)

## 2023-12-08 LAB — COMPREHENSIVE METABOLIC PANEL WITH GFR
ALT: 17 U/L (ref 0–44)
AST: 14 U/L — ABNORMAL LOW (ref 15–41)
Albumin: 3.3 g/dL — ABNORMAL LOW (ref 3.5–5.0)
Alkaline Phosphatase: 27 U/L — ABNORMAL LOW (ref 38–126)
Anion gap: 11 (ref 5–15)
BUN: 16 mg/dL (ref 8–23)
CO2: 27 mmol/L (ref 22–32)
Calcium: 9 mg/dL (ref 8.9–10.3)
Chloride: 99 mmol/L (ref 98–111)
Creatinine, Ser: 0.54 mg/dL (ref 0.44–1.00)
GFR, Estimated: >60 mL/min (ref >60)
Glucose, Bld: 103 mg/dL — ABNORMAL HIGH (ref 70–99)
Potassium: 3.1 mmol/L — ABNORMAL LOW (ref 3.5–5.1)
Sodium: 137 mmol/L (ref 135–145)
Total Bilirubin: 1 mg/dL (ref 0.0–1.2)
Total Protein: 5.9 g/dL — ABNORMAL LOW (ref 6.5–8.1)

## 2023-12-08 LAB — MAGNESIUM: Magnesium: 2.4 mg/dL (ref 1.7–2.4)

## 2023-12-08 LAB — PHOSPHORUS: Phosphorus: 3.6 mg/dL (ref 2.5–4.6)

## 2023-12-08 MED ORDER — FERROUS SULFATE 325 (65 FE) MG PO TABS
325.0000 mg | ORAL_TABLET | Freq: Every day | ORAL | Status: DC
  Administered 2023-12-09: 325 mg via ORAL
  Filled 2023-12-08: qty 1

## 2023-12-08 MED ORDER — SODIUM CHLORIDE 0.9 % IV SOLN: INTRAVENOUS | Status: AC

## 2023-12-08 MED ORDER — SODIUM CHLORIDE 0.9 % IV SOLN
2.0000 g | Freq: Once | INTRAVENOUS | Status: AC
  Administered 2023-12-08: 2 g via INTRAVENOUS
  Filled 2023-12-08: qty 12.5

## 2023-12-08 MED ORDER — POTASSIUM CHLORIDE 20 MEQ PO PACK
40.0000 meq | PACK | ORAL | Status: AC
  Administered 2023-12-08 (×2): 40 meq via ORAL
  Filled 2023-12-08 (×2): qty 2

## 2023-12-08 MED ORDER — SODIUM CHLORIDE 0.9 % IV SOLN
2.0000 g | Freq: Two times a day (BID) | INTRAVENOUS | Status: DC
  Administered 2023-12-08: 2 g via INTRAVENOUS
  Filled 2023-12-08: qty 12.5

## 2023-12-08 NOTE — Plan of Care (Signed)
  Problem: Education: Goal: Knowledge of General Education information will improve Description: Including pain rating scale, medication(s)/side effects and non-pharmacologic comfort measures Outcome: Progressing   Problem: Health Behavior/Discharge Planning: Goal: Ability to manage health-related needs will improve Outcome: Progressing   Problem: Clinical Measurements: Goal: Ability to maintain clinical measurements within normal limits will improve Outcome: Progressing Goal: Will remain free from infection Outcome: Progressing Goal: Diagnostic test results will improve Outcome: Progressing Goal: Respiratory complications will improve Outcome: Progressing Goal: Cardiovascular complication will be avoided Outcome: Progressing   Problem: Activity: Goal: Risk for activity intolerance will decrease Outcome: Progressing   Problem: Nutrition: Goal: Adequate nutrition will be maintained Outcome: Progressing   Problem: Coping: Goal: Level of anxiety will decrease Outcome: Progressing   Problem: Elimination: Goal: Will not experience complications related to bowel motility Outcome: Progressing Goal: Will not experience complications related to urinary retention Outcome: Progressing   Problem: Pain Managment: Goal: General experience of comfort will improve and/or be controlled Outcome: Progressing   Problem: Safety: Goal: Ability to remain free from injury will improve Outcome: Progressing   Problem: Skin Integrity: Goal: Risk for impaired skin integrity will decrease Outcome: Progressing  Pt needs a living in care. Pt has limited vision and needs hearing aids that were left at home

## 2023-12-08 NOTE — Progress Notes (Signed)
 PROGRESS NOTE    Kathryn Romero  FMW:[id number] DOB: Apr 27, 1936 DOA: 12/07/2023 PCP: Janey Santos, MD    Brief Narrative:   Kathryn Romero is a 88 y.o. female with past medical history significant for HTN, HLD, anemia of chronic medical disease, history of colon cancer, chronic back pain, legal blindness, hard of hearing, who presented to [facility] ED on 12/07/2023 from home via EMS with confusion.  Has been prescribed cephalexin  over the last month but unable to get her prescription for concern of recurrent UTI. Patient lives alone, supported by close friends who assist her in her daily needs.  Denies any recent falls, utilizes a cane/walker at baseline  In the ED, temperature 98.8 F, HR 75, RR 17, BP 142/79, SpO2 96% on room air.  WBC 5.9, hemoglobin 13.0, platelet count 152.  Sodium 138, potassium 3.3, chloride 98, CO2 25, glucose 119, BUN 17, creatinine 0.62.  Lipase 30, AST 18, ALT 19, total bilirubin 1.2.  Urinalysis with trace leukocytes, negative nitrite, many bacteria, 11-20 WBCs.  Chest x-ray with no acute cardiopulmonary disease findings.  CT head without contrast with no acute intracranial abnormality, diffuse heterogeneous low-attenuation with the cerebral white matter representing chronic small vessel disease, findings suggestive of brain atrophy.  Urine culture obtained.  Patient was given 500 mL LR bolus, IV Zofran , Tylenol , and started on ceftriaxone .  TRH consulted for admission for further evaluation management of acute metabolic encephalopathy secondary to urinary tract infection.  Assessment & Plan:   Acute metabolic encephalopathy, POA: Resolved Urinary tract infection Patient presenting to ED with confusion, history of recurrent UTIs.  Has been prescribed cephalexin  outpatient but unable to obtain.  Patient is afebrile without leukocytosis.  Urinalysis consistent with UTI.  Ammonia level within normal limits.  CT head and chest x-ray with no acute findings. -- Urine  culture: Pending -- change ceftriaxone  to cefepime  given history of ESBL E. coli UTI (on Ucx 05/2023) -- contact isolation precautions  Hypokalemia Potassium 3.1, will replete. -- Repeat electrolytes in a.m.  HTN BP 131/66, currently not on antihypertensives outpatient. -- Continue monitor BP closely  HLD No longer takes a statin  Anemia of chronic medical disease/iron deficiency anemia Hemoglobin 11.2, stable. -- Ferrous sulfate  325 mg p.o. daily  History of colon cancer Continue outpatient follow-up with oncology  Weakness/debility/deconditioning/gait disturbance: -- PT/OT evaluate  Legal blindness Hard of hearing --Supportive care, fall/delirium precautions   DVT prophylaxis: SCDs Start: 12/07/23 2249    Code Status: Full Code Family Communication: No family present at bedside this morning  Disposition Plan:  Level of care: Telemetry Status is: Inpatient Remains inpatient appropriate because: IV antibiotics    Consultants:  None  Procedures:  None  Antimicrobials:  Ceftriaxone  7/30 - 7/30 Cefepime  7/31>>   Subjective: Patient seen examined bedside, lying in bed.  No family/friends present.  No specific complaints this morning and feels that her mental status is back at baseline.  Alert and oriented x 4.  Discussed with patient given her history of ESBL E. coli, need to see urine culture results before able to transition to oral antibiotics.  Ceftriaxone  increased to cefepime  today due to urine culture results in January 2025 with ESBL E. coli.  Patient reports feeling depressed due to her blindness and hard of hearing, but has been able to live in her own home over the last 55 years and has close friends for support.  Discussed obtaining therapy evaluation.  No other specific questions, concerns or complaints at this time.  Denies  headache, no dizziness, no chest pain, no palpitations, no shortness of breath, no abdominal pain, no fever/chills/night sweats, no  nausea/vomiting/diarrhea.  No acute events overnight per nursing staff.  Objective: Vitals:   12/07/23 2241 12/07/23 2242 12/08/23 0253 12/08/23 0654  BP: 135/82  131/66 117/61  Pulse: 71  71 69  Resp: 16  16 16   Temp: (!) 97.4 F (36.3 C)  97.8 F (36.6 C) 98.3 F (36.8 C)  TempSrc:      SpO2: 96%  94% 92%  Height:  5' 2 (1.575 m)     No intake or output data in the 24 hours ending 12/08/23 1214 There were no vitals filed for this visit.  Examination:  Physical Exam: GEN: NAD, alert and oriented x 3, elderly in appearance, hard of hearing, noted decreased visual acuity HEENT: NCAT, PERRL, EOMI, sclera clear, MMM, decreased visual acuity PULM: CTAB w/o wheezes/crackles, normal respiratory effort CV: RRR w/o M/G/R GI: abd soft, NTND, NABS, no R/G/M MSK: no peripheral edema, muscle strength globally intact 5/5 bilateral upper/lower extremities NEURO: No focal neurological deficits, other than noted chronic HOH and visual disturbance PSYCH: Depressed mood, normal affect Integumentary: No concerning rashes/lesions/wounds noted on exposed skin surfaces    Data Reviewed: I have personally reviewed following labs and imaging studies  CBC: Recent Labs  Lab 12/07/23 1648 12/08/23 0025  WBC 5.9 4.3  NEUTROABS 4.0  --   HGB 13.0 11.2*  HCT 39.8 34.7*  MCV 95.7 95.6  PLT 152 127*   Basic Metabolic Panel: Recent Labs  Lab 12/07/23 1648 12/07/23 1734 12/08/23 0025  NA 138  --  137  K 3.3*  --  3.1*  CL 98  --  99  CO2 25  --  27  GLUCOSE 119*  --  103*  BUN 17  --  16  CREATININE 0.62  --  0.54  CALCIUM  9.5  --  9.0  MG  --  2.2 2.4  PHOS  --  3.7 3.6   GFR: CrCl cannot be calculated (Unknown ideal weight.). Liver Function Tests: Recent Labs  Lab 12/07/23 1648 12/08/23 0025  AST 18 14*  ALT 19 17  ALKPHOS 35* 27*  BILITOT 1.2 1.0  PROT 7.2 5.9*  ALBUMIN 4.0 3.3*   Recent Labs  Lab 12/07/23 1648  LIPASE 30   Recent Labs  Lab 12/07/23 1734   AMMONIA 14   Coagulation Profile: No results for input(s): INR, PROTIME in the last 168 hours. Cardiac Enzymes: Recent Labs  Lab 12/08/23 0025  CKTOTAL 62   BNP (last 3 results) No results for input(s): PROBNP in the last 8760 hours. HbA1C: No results for input(s): HGBA1C in the last 72 hours. CBG: No results for input(s): GLUCAP in the last 168 hours. Lipid Profile: No results for input(s): CHOL, HDL, LDLCALC, TRIG, CHOLHDL, LDLDIRECT in the last 72 hours. Thyroid  Function Tests: No results for input(s): TSH, T4TOTAL, FREET4, T3FREE, THYROIDAB in the last 72 hours. Anemia Panel: No results for input(s): VITAMINB12, FOLATE, FERRITIN, TIBC, IRON, RETICCTPCT in the last 72 hours. Sepsis Labs: No results for input(s): PROCALCITON, LATICACIDVEN in the last 168 hours.  No results found for this or any previous visit (from the past 240 hours).       Radiology Studies: CT HEAD WO CONTRAST Result Date: 12/07/2023 EXAM: CT HEAD WITHOUT 12/07/2023 02:45:32 PM TECHNIQUE: CT of the head was performed without the administration of intravenous contrast. Automated exposure control, iterative reconstruction, and/or weight based adjustment of the  mA/kV was utilized to reduce the radiation dose to as low as reasonably achievable. COMPARISON: 07/31/2022 CLINICAL HISTORY: Mental status change, unknown cause. UTI for past month, prescribed cephalexin  med non-compliant. Baseline confusion. Today increased confusion. New onset incontinence. FINDINGS: BRAIN AND VENTRICLES: No acute intracranial hemorrhage. No mass effect or midline shift. No extra-axial fluid collection. Gray-white differentiation is maintained. No hydrocephalus. Diffuse heterogeneous low attenuation within the cerebral white matter, most likely representing chronic small vessel disease. Prominence of the sulci and ventricles compatible with brain atrophy. ORBITS: No acute abnormality.  SINUSES AND MASTOIDS: No acute abnormality. SOFT TISSUES AND SKULL: No acute skull fracture. No acute soft tissue abnormality. IMPRESSION: 1. No acute intracranial abnormality. 2. Diffuse heterogeneous low attenuation within the cerebral white matter, most likely representing chronic small vessel disease. 3. Prominence of the sulci and ventricles compatible with brain atrophy. Electronically signed by: Waddell Calk MD 12/07/2023 03:02 PM EDT RP Workstation: [id number]   DG Chest 1 View Result Date: 12/07/2023 CLINICAL DATA:  ALTERED MENTAL STATUS, DYSURIA EXAM: CHEST  1 VIEW COMPARISON:  October 15, 2023 FINDINGS: No focal airspace consolidation, pleural effusion, or pneumothorax. Mild cardiomegaly. Tortuous aorta with aortic atherosclerosis. No acute fracture or destructive lesion. Partially visualized lumbar fusion hardware. Multilevel thoracic osteophytosis. IMPRESSION: No acute cardiopulmonary abnormality. Electronically Signed   By: Rogelia Myers M.D.   On: 12/07/2023 14:16        Scheduled Meds:  pantoprazole   40 mg Oral QAC supper   Continuous Infusions:  sodium chloride  75 mL/hr at 12/08/23 9193   ceFEPime  (MAXIPIME ) IV       LOS: 1 day    Time spent: 52 minutes spent on 12/08/2023 caring for this patient face-to-face including chart review, ordering labs/tests, documenting, discussion with nursing staff, consultants, updating family and interview/physical exam    Camellia PARAS [place], DO [facility] Available via Epic secure chat 7am-7pm After these hours, please refer to coverage provider listed on [email or web address] 12/08/2023, 12:14 PM

## 2023-12-08 NOTE — Evaluation (Signed)
 Physical Therapy Evaluation Patient Details Name: Kathryn Romero MRN: [id number] DOB: July 05, 1935 Today's Date: 12/08/2023  History of Present Illness  Kathryn Romero is a 88 y.o. female who presented to [facility] ED on 12/07/2023 from home via EMS with confusion. PMH: HTN, HLD, anemia of chronic medical disease, history of colon cancer, chronic back pain, legal blindness, hard of hearing  Clinical Impression  Pt admitted with above diagnosis. Pt from home alone, [facility] with [facility] daily and baths 1x/week, pt reports ind using rollator for in home ambulation, pt reports living there for 55 years so she is very familiar and mobilizing around the home is not difficult. Pt reports she has a fear of falling, mostly stays in the home due to that. Pt reports friends assist with transport chair transfers up/down front steps of the home. On eval, pt pleasantly confused, perseverating on therapist's names and inability to visualize therapists or the room. Pt needing mod A +2 to come to sitting EOB, initially needing assistance to find balance while seated with LOB in all directions but improves and able to hold self at bedside with supv. Pt performs STS with min A+2 with RW, limited standing tolerance, able to engage in static marching x10 reps but then reports this is too much and returns to sitting. Assisted pt back to supine with phone on chest and call button at R side per pt request; reviewed call button use and pt able to demonstrate good calling; notified nursing that pt may need soft touch call button if unable to consistently use call button. Recommend SNF vs home with 24/7 assistance at home. Pt currently with functional limitations due to the deficits listed below (see PT Problem List). Pt will benefit from acute skilled PT to increase their independence and safety with mobility to allow discharge.           If plan is discharge home,  recommend the following: A little help with walking and/or transfers;A little help with bathing/dressing/bathroom;Assistance with cooking/housework;Assist for transportation;Help with stairs or ramp for entrance   Can travel by private vehicle   No    Equipment Recommendations None recommended by PT  Recommendations for Other Services       Functional Status Assessment Patient has had a recent decline in their functional status and demonstrates the ability to make significant improvements in function in a reasonable and predictable amount of time.     Precautions / Restrictions Precautions Precautions: Fall Precaution/Restrictions Comments: HOH, blind Restrictions Weight Bearing Restrictions Per Provider Order: No      Mobility  Bed Mobility Overal bed mobility: Needs Assistance Bed Mobility: Supine to Sit, Sit to Supine     Supine to sit: Mod assist, +2 for physical assistance Sit to supine: Mod assist, +2 for physical assistance   General bed mobility comments: directional cues, extra time    Transfers Overall transfer level: Needs assistance   Transfers: Sit to/from Stand Sit to Stand: Min assist, +2 physical assistance           General transfer comment: verbal cues for initation with directions, planning, pt prefers to pull from RW    Ambulation/Gait Ambulation/Gait assistance: Min assist   Assistive device: Rolling walker (2 wheels)         General Gait Details: pt performs marching in place x10 reps, good weight shift, declines moving away from bedside due to unfamiliar setting  Stairs  Wheelchair Mobility     Tilt Bed    Modified Rankin (Stroke Patients Only)       Balance Overall balance assessment: Needs assistance Sitting-balance support: No upper extremity supported, Feet supported Sitting balance-Leahy Scale: Fair     Standing balance support: Bilateral upper extremity supported, Reliant on assistive device for  balance Standing balance-Leahy Scale: Poor                               Pertinent Vitals/Pain Pain Assessment Pain Assessment: No/denies pain    Home Living Family/patient expects to be discharged to:: Private residence Living Arrangements: Alone Available Help at Discharge: Family;Available PRN/intermittently;Friend(s);Personal care attendant Type of Home: House Home Access: Stairs to enter Entrance Stairs-Rails: Doctor, general practice of Steps: 2   Home Layout: One level Home Equipment: Rollator (4 wheels);Transport chair Additional Comments: [facility]    Prior Function               Mobility Comments: pt reports using rollator for household distances, transport chair for stair negotiation ADLs Comments: pt reports aide assists with tub bath 1x/week, reports I just stay in my pajamas all the time, Ind with toileting, aide cooks meals or friends provides meals     Extremity/Trunk Assessment   Upper Extremity Assessment Upper Extremity Assessment: Defer to OT evaluation    Lower Extremity Assessment Lower Extremity Assessment: Generalized weakness (peripheral neuropathy per pt report)    Cervical / Trunk Assessment Cervical / Trunk Assessment: Kyphotic  Communication   Communication Communication: Impaired Factors Affecting Communication: Hearing impaired    Cognition Arousal: Alert Behavior During Therapy: WFL for tasks assessed/performed   PT - Cognitive impairments: No family/caregiver present to determine baseline                       PT - Cognition Comments: pt easily distracted, therapists reminding her that she is in hospital and we work for the hospital         Cueing Cueing Techniques: Verbal cues, Tactile cues     General Comments      Exercises     Assessment/Plan    PT Assessment Patient needs continued PT services  PT Problem List Decreased  strength;Decreased activity tolerance;Decreased balance;Decreased mobility;Decreased knowledge of use of DME;Decreased cognition;Decreased safety awareness;Impaired sensation       PT Treatment Interventions DME instruction;Gait training;Functional mobility training;Therapeutic activities;Therapeutic exercise;Balance training;Neuromuscular re-education;Cognitive remediation;Patient/family education    PT Goals (Current goals can be found in the Care Plan section)  Acute Rehab PT Goals Patient Stated Goal: agreeable to therapy PT Goal Formulation: With patient Time For Goal Achievement: 12/22/23 Potential to Achieve Goals: Good    Frequency Min 2X/week     Co-evaluation   Reason for Co-Treatment: Complexity of the patient's impairments (multi-system involvement);For patient/therapist safety   OT goals addressed during session: ADL's and self-care;Proper use of Adaptive equipment and DME       AM-PAC PT 6 Clicks Mobility  Outcome Measure Help needed turning from your back to your side while in a flat bed without using bedrails?: A Lot Help needed moving from lying on your back to sitting on the side of a flat bed without using bedrails?: A Lot Help needed moving to and from a bed to a chair (including a wheelchair)?: A Little Help needed standing up from a chair using your arms (e.g., wheelchair  or bedside chair)?: A Little Help needed to walk in hospital room?: A Lot Help needed climbing 3-5 steps with a railing? : Total 6 Click Score: 13    End of Session Equipment Utilized During Treatment: Gait belt Activity Tolerance: Patient tolerated treatment well Patient left: in bed;with call bell/phone within reach;with bed alarm set Nurse Communication: Mobility status;Other (comment) PT Visit Diagnosis: Unsteadiness on feet (R26.81);Muscle weakness (generalized) (M62.81)    Time: 8670-8596 PT Time Calculation (min) (ACUTE ONLY): 34 min   Charges:   PT Evaluation $PT Eval  Moderate Complexity: 1 Mod   PT General Charges $$ ACUTE PT VISIT: 1 Visit         Tori Shella Lahman PT, DPT 12/08/23, 3:08 PM

## 2023-12-08 NOTE — Evaluation (Signed)
 Occupational Therapy Evaluation Patient Details Name: Kathryn Romero MRN: [id number] DOB: 05/12/1935 Today's Date: 12/08/2023   History of Present Illness   Kathryn Romero is a 88 y.o. female who presented to [facility] ED on 12/07/2023 from home via EMS with confusion. PMH: HTN, HLD, anemia of chronic medical disease, history of colon cancer, chronic back pain, legal blindness, hard of hearing     Clinical Impressions Pt presents with decline in function and safety with ADLs and ADL mobility with impaired strength, balance, endurance; pt HOH and visually impaired. PTA pt [facility] hours[facility] with aide that assists with breakfast, tub bath 1x/week, reports I just stay in my pajamas all the time, Ind with toileting, aide cooks meals or friends provides meals.pt reports using rollator for household distances, transport chair for stairs. Pt currently requires mod A =2 to sit EOB, max A with LB ADLs, mod A with toileting, min A +2 for sit - stand/mobility with RW. Pt required cues for directions, initiation planning and repeated instructions throughout. Recommend SNF or 24/7 sup and assist to return home. OT will follow acutely to maximize level of function and safety    If plan is discharge home, recommend the following:   A lot of help with bathing/dressing/bathroom;A little help with walking and/or transfers;Assist for transportation;Help with stairs or ramp for entrance;Direct supervision/assist for medications management;Assistance with cooking/housework;Direct supervision/assist for financial management     Functional Status Assessment   Patient has had a recent decline in their functional status and demonstrates the ability to make significant improvements in function in a reasonable and predictable amount of time.     Equipment Recommendations   None recommended by OT     Recommendations for Other Services          Precautions/Restrictions   Precautions Precautions: Fall;Other (comment) (HOHm blindness) Restrictions Weight Bearing Restrictions Per Provider Order: No     Mobility Bed Mobility Overal bed mobility: Needs Assistance Bed Mobility: Supine to Sit, Sit to Supine     Supine to sit: Mod assist, +2 for physical assistance Sit to supine: Mod assist, +2 for physical assistance   General bed mobility comments: directional cues, extra time    Transfers Overall transfer level: Needs assistance Equipment used: Rolling walker (2 wheels) Transfers: Sit to/from Stand Sit to Stand: Min assist, +2 physical assistance           General transfer comment: verbal cues for initation with directions, planning, pt prefers to pull from RW      Balance Overall balance assessment: Needs assistance Sitting-balance support: No upper extremity supported, Feet supported Sitting balance-Leahy Scale: Fair     Standing balance support: Bilateral upper extremity supported Standing balance-Leahy Scale: Poor                             ADL either performed or assessed with clinical judgement   ADL Overall ADL's : Needs assistance/impaired Eating/Feeding: Set up;Sitting   Grooming: Wash/dry hands;Wash/dry face;Contact guard assist;Sitting   Upper Body Bathing: Minimal assistance;Sitting   Lower Body Bathing: Maximal assistance   Upper Body Dressing : Minimal assistance;Sitting   Lower Body Dressing: Maximal assistance   Toilet Transfer: Minimal assistance;+2 for safety/equipment;Rolling walker (2 wheels);Cueing for safety;Cueing for sequencing   Toileting- Clothing Manipulation and Hygiene: Moderate assistance;Sit to/from stand       Functional mobility during ADLs: Minimal assistance;+2 for physical assistance;Cueing for safety;Cueing  for sequencing       Vision Ability to See in Adequate Light: 3 Highly impaired (blind) Patient Visual Report: No change from  baseline       Perception         Praxis         Pertinent Vitals/Pain Pain Assessment Pain Assessment: No/denies pain     Extremity/Trunk Assessment Upper Extremity Assessment Upper Extremity Assessment: Defer to OT evaluation   Lower Extremity Assessment Lower Extremity Assessment: Generalized weakness (peripheral neuropathy per pt report)   Cervical / Trunk Assessment Cervical / Trunk Assessment: Kyphotic   Communication Communication Communication: Impaired Factors Affecting Communication: Hearing impaired   Cognition Arousal: Alert Behavior During Therapy: WFL for tasks assessed/performed Cognition: History of cognitive impairments             OT - Cognition Comments: pt reports memory impaired at baseline                         Cueing  General Comments   Cueing Techniques: Verbal cues;Tactile cues      Exercises     Shoulder Instructions      Home Living Family/patient expects to be discharged to:: Private residence Living Arrangements: Alone Available Help at Discharge: Family;Available PRN/intermittently;Friend(s);Personal care attendant Type of Home: House Home Access: Stairs to enter [employer] of Steps: 2 Entrance Stairs-Rails: Right;Left Home Layout: One level     Bathroom Shower/Tub: Chief Strategy Officer: Standard     Home Equipment: Rollator (4 wheels);Transport chair   Additional Comments: [facility]      Prior Functioning/Environment               Mobility Comments: pt reports using rollator for household distances, transport chair for stair negotiation ADLs Comments: pt reports aide assists with tub bath 1x/week, reports I just stay in my pajamas all the time, Ind with toileting, aide cooks meals or friends provides meals    OT Problem List: Decreased strength;Impaired balance (sitting and/or standing);Decreased cognition;Impaired  vision/perception;Decreased activity tolerance   OT Treatment/Interventions: Self-care/ADL training;Visual/perceptual remediation/compensation;Patient/family education;Therapeutic exercise;Therapeutic activities;Balance training      OT Goals(Current goals can be found in the care plan section)   Acute Rehab OT Goals Patient Stated Goal: get better OT Goal Formulation: With patient Time For Goal Achievement: 12/22/23 Potential to Achieve Goals: Good ADL Goals Pt Will Perform Grooming: with supervision;with set-up;sitting Pt Will Perform Upper Body Bathing: with contact guard assist;with supervision;sitting Pt Will Perform Upper Body Dressing: with contact guard assist;with supervision;sitting Pt Will Transfer to Toilet: with min assist;with contact guard assist;ambulating;stand pivot transfer Pt Will Perform Toileting - Clothing Manipulation and hygiene: with min assist;with contact guard assist;sitting/lateral leans;sit to/from stand   OT Frequency:  Min 2X/week    Co-evaluation PT/OT/SLP Co-Evaluation/Treatment: Yes Reason for Co-Treatment: Complexity of the patient's impairments (multi-system involvement);For patient/therapist safety   OT goals addressed during session: ADL's and self-care;Proper use of Adaptive equipment and DME      AM-PAC OT 6 Clicks Daily Activity     Outcome Measure Help from another person eating meals?: None Help from another person taking care of personal grooming?: A Little Help from another person toileting, which includes using toliet, bedpan, or urinal?: A Lot Help from another person bathing (including washing, rinsing, drying)?: A Lot Help from another person to put on and taking off regular upper body clothing?: A Little Help from another person  to put on and taking off regular lower body clothing?: A Lot 6 Click Score: 16   End of Session Equipment Utilized During Treatment: Gait belt;Rolling walker (2 wheels)  Activity Tolerance:  Patient tolerated treatment well Patient left: in bed;with call bell/phone within reach  OT Visit Diagnosis: Other abnormalities of gait and mobility (R26.89);Unsteadiness on feet (R26.81);Muscle weakness (generalized) (M62.81)                Time: 1329-1401 OT Time Calculation (min): 32 min Charges:  OT General Charges $OT Visit: 1 Visit OT Evaluation $OT Eval Moderate Complexity: 1 Mod    Jacques Karna Loose 12/08/2023, 3:06 PM

## 2023-12-09 ENCOUNTER — Other Ambulatory Visit (HOSPITAL_COMMUNITY): Payer: Self-pay

## 2023-12-09 DIAGNOSIS — E876 Hypokalemia: Secondary | ICD-10-CM | POA: Diagnosis not present

## 2023-12-09 LAB — URINE CULTURE

## 2023-12-09 LAB — BASIC METABOLIC PANEL WITH GFR
Anion gap: 7 (ref 5–15)
BUN: 16 mg/dL (ref 8–23)
CO2: 24 mmol/L (ref 22–32)
Calcium: 8.7 mg/dL — ABNORMAL LOW (ref 8.9–10.3)
Chloride: 108 mmol/L (ref 98–111)
Creatinine, Ser: 0.61 mg/dL (ref 0.44–1.00)
GFR, Estimated: >60 mL/min (ref >60)
Glucose, Bld: 100 mg/dL — ABNORMAL HIGH (ref 70–99)
Potassium: 3.7 mmol/L (ref 3.5–5.1)
Sodium: 139 mmol/L (ref 135–145)

## 2023-12-09 MED ORDER — POTASSIUM CHLORIDE 20 MEQ PO PACK
40.0000 meq | PACK | Freq: Once | ORAL | Status: AC
  Administered 2023-12-09: 40 meq via ORAL
  Filled 2023-12-09: qty 2

## 2023-12-09 MED ORDER — CEFADROXIL 500 MG PO CAPS
500.0000 mg | ORAL_CAPSULE | Freq: Two times a day (BID) | ORAL | 0 refills | Status: AC
Start: 2023-12-09 — End: 2023-12-14
  Filled 2023-12-09: qty 10, 5d supply, fill #0

## 2023-12-09 NOTE — TOC Initial Note (Signed)
 Transition of Care [facility]) - Initial/Assessment Note    Patient Details  Name: Kathryn Romero MRN: [id number] Date of Birth: 1935/08/05  Transition of [facility]) CM/SW Contact:    Sonda Manuella Quill, RN Phone Number: 12/09/2023, 11:08 AM  Clinical Narrative:                 Spoke w/ pt in room; she requested her niece Laneta Patience [phone]) be contacted; spoke w/ her niece; she says pt lives at home,  she plans for pt to return at d/c; Ms Sabas will arrange transportation; she verified insurance/PCP; she denied pt experiencing SDOH risks; pt has Rollator, wheelchair, BSC; Aide from [facility]; pt does not have home oxygen; explained PT recc SNF if pt not able to have 24/7 care at home; Ms Marzetta verbalized understanding, and declined SNF per pt's wishes; she said pt has aide from [facility] and neighbors that check on her; she is also in the process of securing a caregiver to move in with pt; no TOC needs.  Expected Discharge Plan: Home/Self Care Barriers to Discharge: No Barriers Identified   Patient Goals and CMS Choice Patient states their goals for this hospitalization and ongoing recovery are:: Laneta Patience (niece) says pt will return home CMS Medicare.gov Compare Post Acute Care list provided to:: Patient Represenative (must comment) Camilo Sabas (niece))        Expected Discharge Plan and Services   Discharge Planning Services: CM Consult   Living arrangements for the past 2 months: Single Family Home Expected Discharge Date: 12/09/23               DME Arranged: N/A DME Agency: NA       HH Arranged: NA HH Agency: NA        Prior Living Arrangements/Services Living arrangements for the past 2 months: Single Family Home Lives with:: Self Patient language and need for interpreter reviewed:: Yes Do you feel safe going back to the place where you live?: Yes      Need for Family Participation in Patient Care: Yes (Comment) Care giver support system  in place?: Yes (comment) Current home services: DME, Homehealth aide (Rollator, wheelchair, BSC; Aide from [facility]) Criminal Activity/Legal Involvement Pertinent to Current Situation/Hospitalization: No - Comment as needed  Activities of Daily Living   ADL Screening (condition at time of admission) Independently performs ADLs?: No Does the patient have a NEW difficulty with bathing/dressing/toileting/self-feeding that is expected to last >3 days?: Yes (Initiates electronic notice to provider for possible OT consult) Does the patient have a NEW difficulty with getting in/out of bed, walking, or climbing stairs that is expected to last >3 days?: Yes (Initiates electronic notice to provider for possible PT consult) Does the patient have a NEW difficulty with communication that is expected to last >3 days?: No Is the patient deaf or have difficulty hearing?: Yes Does the patient have difficulty seeing, even when wearing glasses/contacts?: Yes Does the patient have difficulty concentrating, remembering, or making decisions?: Yes  Permission Sought/Granted Permission sought to share information with : Case Manager Permission granted to share information with : Yes, Verbal Permission Granted  Share Information with NAME: Case Manager     Permission granted to share info w Relationship: Laneta Patience (niece) [phone]     Emotional Assessment Appearance:: Appears stated age Attitude/Demeanor/Rapport: Gracious Affect (typically observed): Accepting Orientation: : Oriented to Self, Oriented to Place, Oriented to  Time, Oriented to Situation Alcohol  / Substance Use: Not Applicable Psych Involvement:  No (comment)  Admission diagnosis:  Hypokalemia [E87.6] Urinary tract infection without hematuria, site unspecified [N39.0] Altered mental status, unspecified altered mental status type [R41.82] Acute metabolic encephalopathy [G93.41] Patient Active Problem List   Diagnosis Date Noted    Acute metabolic encephalopathy 12/07/2023   Failure to thrive (child) 06/27/2023   Adult wellness visit 06/20/2023   Gastroenteritis 06/20/2023   Diarrhea 02/22/2022   Abdominal pain 02/22/2022   Nausea & vomiting 02/20/2022   Hypokalemia 02/20/2022   Legal blindness 02/20/2022   Degenerative lumbar spinal stenosis 01/06/2018   Spondylolisthesis at L4-L5 level 11/14/2015   DOE (dyspnea on exertion) 10/14/2014   Generalized weakness 09/19/2014   UTI (urinary tract infection) 09/19/2014   Obese 05/20/2014   Nausea with vomiting 04/16/2009   RETINITIS PIGMENTOSA 12/04/2008   GLUCOSE INTOLERANCE 05/09/2007   Dyslipidemia 05/09/2007   ANEMIA-NOS 05/09/2007   ANXIETY 05/09/2007   Essential hypertension 05/09/2007   DIVERTICULOSIS, COLON 05/09/2007   BACK PAIN 05/09/2007   History of malignant neoplasm of large intestine 05/09/2007   ARTHRITIS, TRAUMATIC, UNSPECIFIED SITE 12/17/2006   PCP:  Janey Santos, MD Pharmacy:   [facility] [phone] - [place], [place] - [address] AT [facility] [address] [place] [address] Phone: [phone] Fax: [phone]     Social Drivers of Health (SDOH) Social History: SDOH Screenings   Food Insecurity: No Food Insecurity (12/09/2023)  Housing: Low Risk  (12/09/2023)  Transportation Needs: No Transportation Needs (12/09/2023)  Recent Concern: Transportation Needs - Unmet Transportation Needs (12/07/2023)  Utilities: Not At Risk (12/09/2023)  Social Connections: Patient Declined (12/07/2023)  Tobacco Use: Low Risk  (11/12/2023)   SDOH Interventions: Food Insecurity Interventions: Intervention Not Indicated, Inpatient TOC Housing Interventions: Intervention Not Indicated, Inpatient TOC Transportation Interventions: Intervention Not Indicated, Inpatient TOC Utilities Interventions: Intervention Not Indicated, Inpatient TOC   Readmission Risk Interventions    06/29/2023   11:19 AM 06/27/2023     1:27 PM 02/22/2022    8:25 AM  Readmission Risk Prevention Plan  Post Dischage Appt Complete Complete Complete  Medication Screening Complete Complete Complete  Transportation Screening Complete Complete Complete

## 2023-12-09 NOTE — Progress Notes (Signed)
 Mobility Specialist - Progress Note   12/09/23 0951  Mobility  Activity Ambulated with assistance  Level of Assistance Maximum assist, patient does 25-49%  Assistive Device Front wheel walker  Distance Ambulated (ft) 3 ft  Range of Motion/Exercises Active Assistive  Activity Response Tolerated well  Mobility Referral Yes  Mobility visit 1 Mobility  Mobility Specialist Start Time (ACUTE ONLY) 0930  Mobility Specialist Stop Time (ACUTE ONLY) 0951  Mobility Specialist Time Calculation (min) (ACUTE ONLY) 21 min   Pt was found in bed and agreeable to mobilize. Assisted to sit EOB and able to take a couple of lateral steps to head of bed. At EOS returned to bed with all needs met. Call bell in reach.  Erminio Leos,  Mobility Specialist Can be reached via Secure Chat

## 2023-12-09 NOTE — Care Management Obs Status (Signed)
 MEDICARE OBSERVATION STATUS NOTIFICATION   Patient Details  Name: Kathryn Romero MRN: [id number] Date of Birth: 28-Mar-1936   Medicare Observation Status Notification Given:  Yes    Sonda Manuella Quill, RN 12/09/2023, 10:36 AM

## 2023-12-09 NOTE — Discharge Summary (Signed)
 Physician Discharge Summary  Kathryn Romero FMW:[id number] DOB: [birth year] DOA: 12/07/2023  PCP: Janey Santos, MD  Admit date: 12/07/2023 Discharge date: 12/09/2023  Admitted From: Home Disposition: Home with home health  Recommendations for Outpatient Follow-up:  Follow up with PCP in 1-2 weeks Follow-up with urology and neurology as scheduled outpatient Continue cefadroxil  to complete 7-day course of antibiotics for UTI  Home Health: PT/OT/RN/aide/social work Equipment/Devices: None  Discharge Condition: Stable CODE STATUS: Full code Diet recommendation: Regular diet  History of present illness:  Kathryn Romero is a 88 y.o. female with past medical history significant for HTN, HLD, anemia of chronic medical disease, history of colon cancer, chronic back pain, legal blindness, hard of hearing, who presented to [facility] ED on 12/07/2023 from home via EMS with confusion.  Has been prescribed cephalexin  over the last month but unable to get her prescription for concern of recurrent UTI. Patient lives alone, supported by close friends who assist her in her daily needs.  Denies any recent falls, utilizes a cane/walker at baseline   In the ED, temperature 98.8 F, HR 75, RR 17, BP 142/79, SpO2 96% on room air.  WBC 5.9, hemoglobin 13.0, platelet count 152.  Sodium 138, potassium 3.3, chloride 98, CO2 25, glucose 119, BUN 17, creatinine 0.62.  Lipase 30, AST 18, ALT 19, total bilirubin 1.2.  Urinalysis with trace leukocytes, negative nitrite, many bacteria, 11-20 WBCs.  Chest x-ray with no acute cardiopulmonary disease findings.  CT head without contrast with no acute intracranial abnormality, diffuse heterogeneous low-attenuation with the cerebral white matter representing chronic small vessel disease, findings suggestive of brain atrophy.  Urine culture obtained.  Patient was given 500 mL LR bolus, IV Zofran , Tylenol , and started on ceftriaxone .  TRH consulted for admission for further  evaluation management of acute metabolic encephalopathy secondary to urinary tract infection.  Hospital course:  Acute metabolic encephalopathy, POA: Resolved Urinary tract infection Patient presenting to ED with confusion, history of recurrent UTIs.  Has been prescribed cephalexin  outpatient but unable to obtain.  Patient is afebrile without leukocytosis.  Urinalysis consistent with UTI.  Ammonia level within normal limits.  CT head and chest x-ray with no acute findings.  Urine culture with multiple species.  Will continue cefadroxil  on discharge to complete 7-day antibiotic course.  Has outpatient follow-up with urology regarding recurrent UTIs.   Hypokalemia Repleted during hospitalization.   HTN BP 131/66, currently not on antihypertensives outpatient.   HLD No longer takes a statin   Anemia of chronic medical disease/iron deficiency anemia Hemoglobin 11.2, stable. Ferrous sulfate  325 mg p.o. daily   History of colon cancer Continue outpatient follow-up with oncology   Weakness/debility/deconditioning/gait disturbance: Recommended discharge to SNF, patient refusing.  Discharging back home with home health.   Legal blindness Hard of hearing Supportive care, would benefit from 24-hour care at home.  Discharging with maximize home health.  Niece attempting to set up improved 24-hour care at home.  Discharge Diagnoses:  Principal Problem:   Hypokalemia Active Problems:   Dyslipidemia   Essential hypertension   UTI (urinary tract infection)   Acute metabolic encephalopathy    Discharge Instructions  Discharge Instructions     Call MD for:  difficulty breathing, headache or visual disturbances   Complete by: As directed    Call MD for:  extreme fatigue   Complete by: As directed    Call MD for:  persistant dizziness or light-headedness   Complete by: As directed    Call MD for:  persistant  nausea and vomiting   Complete by: As directed    Call MD for:  severe  uncontrolled pain   Complete by: As directed    Call MD for:  temperature >100.4   Complete by: As directed    Diet - low sodium heart healthy   Complete by: As directed    Increase activity slowly   Complete by: As directed       Allergies as of 12/09/2023       Reactions   Codeine Shortness Of Breath, Other (See Comments)   Heart racing, too   Fish Allergy Anaphylaxis, Itching, Other (See Comments)   Felt like she was on fire/burning, headache, itching- also   Hydromet [hydrocodone  Bit-homatrop Mbr] Shortness Of Breath, Other (See Comments)   Wheezing, also   Shellfish Allergy Anaphylaxis, Itching, Other (See Comments)   Felt like she was on fire/burning, headache, itching- also   Shrimp Extract Anaphylaxis, Itching, Rash, Other (See Comments)   Felt like she was on fire/burning, headache, itching- also   Cephalexin  Nausea Only, Other (See Comments)   Tolerates IV Rocephin    Morphine Other (See Comments)   Felt funny and made her crazy   Sulfa Antibiotics Nausea And Vomiting           Medication List     STOP taking these medications    ciprofloxacin  500 MG tablet Commonly known as: CIPRO    ondansetron  4 MG tablet Commonly known as: Zofran        TAKE these medications    Advil 200 MG Caps Generic drug: Ibuprofen Take 400 mg by mouth at bedtime.   Alpha-Lipoic Acid 600 MG Caps Take 600 mg by mouth at bedtime.   cefadroxil  500 MG capsule Commonly known as: DURICEF Take 1 capsule (500 mg total) by mouth 2 (two) times daily for 5 days.   ferrous sulfate  325 (65 FE) MG tablet Take 325 mg by mouth daily with breakfast.   MAG GLYCINATE PO Take 1 capsule by mouth at bedtime.   pantoprazole  40 MG tablet Commonly known as: PROTONIX  Take 40 mg by mouth 2 (two) times daily before a meal.   psyllium 95 % Pack Commonly known as: HYDROCIL/METAMUCIL Take 1 packet by mouth daily.   Vitamin D3 50 MCG (2000 UT) Tabs Take 2,000 Units by mouth daily  with breakfast.   VITAMIN E BLEND PO Take 1 capsule by mouth in the morning.        Follow-up Information     [name], [name], MD. Schedule an appointment as soon as possible for a visit in 1 week(s).   Specialty: Internal Medicine Contact information: [address] [place] [address] [phone]                Allergies  Allergen Reactions   Codeine Shortness Of Breath and Other (See Comments)    Heart racing, too   Fish Allergy Anaphylaxis, Itching and Other (See Comments)    Felt like she was on fire/burning, headache, itching- also   Hydromet [Hydrocodone  Bit-Homatrop Mbr] Shortness Of Breath and Other (See Comments)    Wheezing, also   Shellfish Allergy Anaphylaxis, Itching and Other (See Comments)    Felt like she was on fire/burning, headache, itching- also   Shrimp Extract Anaphylaxis, Itching, Rash and Other (See Comments)    Felt like she was on fire/burning, headache, itching- also   Cephalexin  Nausea Only and Other (See Comments)    Tolerates IV Rocephin    Morphine Other (See Comments)  Felt funny and made her crazy     Sulfa Antibiotics Nausea And Vomiting         Consultations: None   Procedures/Studies: CT HEAD WO CONTRAST Result Date: 12/07/2023 EXAM: CT HEAD WITHOUT 12/07/2023 02:45:32 PM TECHNIQUE: CT of the head was performed without the administration of intravenous contrast. Automated exposure control, iterative reconstruction, and/or weight based adjustment of the mA/kV was utilized to reduce the radiation dose to as low as reasonably achievable. COMPARISON: 07/31/2022 CLINICAL HISTORY: Mental status change, unknown cause. UTI for past month, prescribed cephalexin  med non-compliant. Baseline confusion. Today increased confusion. New onset incontinence. FINDINGS: BRAIN AND VENTRICLES: No acute intracranial hemorrhage. No mass effect or midline shift. No extra-axial fluid collection. Gray-white differentiation is maintained.  No hydrocephalus. Diffuse heterogeneous low attenuation within the cerebral white matter, most likely representing chronic small vessel disease. Prominence of the sulci and ventricles compatible with brain atrophy. ORBITS: No acute abnormality. SINUSES AND MASTOIDS: No acute abnormality. SOFT TISSUES AND SKULL: No acute skull fracture. No acute soft tissue abnormality. IMPRESSION: 1. No acute intracranial abnormality. 2. Diffuse heterogeneous low attenuation within the cerebral white matter, most likely representing chronic small vessel disease. 3. Prominence of the sulci and ventricles compatible with brain atrophy. Electronically signed by: Waddell Calk MD 12/07/2023 03:02 PM EDT RP Workstation: [id number]   DG Chest 1 View Result Date: 12/07/2023 CLINICAL DATA:  ALTERED MENTAL STATUS, DYSURIA EXAM: CHEST  1 VIEW COMPARISON:  October 15, 2023 FINDINGS: No focal airspace consolidation, pleural effusion, or pneumothorax. Mild cardiomegaly. Tortuous aorta with aortic atherosclerosis. No acute fracture or destructive lesion. Partially visualized lumbar fusion hardware. Multilevel thoracic osteophytosis. IMPRESSION: No acute cardiopulmonary abnormality. Electronically Signed   By: Rogelia Myers M.D.   On: 12/07/2023 14:16     Subjective: Patient seen examined bedside, lying in bed.  No specific complaints this morning other than wanting to discharge home.  Refusing SNF placement.  Updated patient niece via telephone this morning.  Niece concerned about possible developing cognitive impairment has upcoming neurology appointment.  Also has upcoming urology appointment due to recurrent UTIs.  Discussed will discharge on oral antibiotics.  Also will maximize home health services as niece is trying to arrange 24-hour care at home currently.  Patient with no other specific complaints, concerns or questions at this time.  Denies headache, no chest pain, no palpitations, no shortness of breath, no abdominal pain, no  fever/chills/night sweats, no nausea/vomiting/diarrhea, no focal weakness, no fatigue, no paresthesia.  No acute events overnight per nursing.  Discharge Exam: Vitals:   12/08/23 2038 12/09/23 0652  BP: 124/61 133/73  Pulse: 71 71  Resp: 16 16  Temp: 98.2 F (36.8 C) 97.9 F (36.6 C)  SpO2: 93% 98%   Vitals:   12/08/23 0654 12/08/23 1639 12/08/23 2038 12/09/23 0652  BP: 117/61 127/63 124/61 133/73  Pulse: 69 74 71 71  Resp: 16 16 16 16   Temp: 98.3 F (36.8 C) 98.2 F (36.8 C) 98.2 F (36.8 C) 97.9 F (36.6 C)  TempSrc:      SpO2: 92% 91% 93% 98%  Height:        Physical Exam: GEN: NAD, alert and oriented x 3, elderly in appearance, hard of hearing, noted decreased visual acuity HEENT: NCAT, PERRL, EOMI, sclera clear, MMM, decreased visual acuity PULM: CTAB w/o wheezes/crackles, normal respiratory effort CV: RRR w/o M/G/R GI: abd soft, NTND, NABS, no R/G/M MSK: no peripheral edema, muscle strength globally intact 5/5 bilateral upper/lower extremities NEURO: No focal  neurological deficits, other than noted chronic HOH and visual disturbance PSYCH: Depressed mood, normal affect Integumentary: No concerning rashes/lesions/wounds noted on exposed skin surfaces    The results of significant diagnostics from this hospitalization (including imaging, microbiology, ancillary and laboratory) are listed below for reference.     Microbiology: Recent Results (from the past 240 hours)  Urine Culture (for pregnant, neutropenic or urologic patients or patients with an indwelling urinary catheter)     Status: Abnormal   Collection Time: 12/07/23  6:49 PM   Specimen: Urine, Clean Catch  Result Value Ref Range Status   Specimen Description   Final    URINE, CLEAN CATCH Performed at [facility], [address], [place], [place] [address]    Special Requests   Final    NONE Performed at [facility], [address], [place], [place] [address]     Culture MULTIPLE SPECIES PRESENT, SUGGEST RECOLLECTION (A)  Final   Report Status 12/09/2023 FINAL  Final     Labs: BNP (last 3 results) No results for input(s): BNP in the last 8760 hours. Basic Metabolic Panel: Recent Labs  Lab 12/07/23 1648 12/07/23 1734 12/08/23 0025 12/09/23 0524  NA 138  --  137 139  K 3.3*  --  3.1* 3.7  CL 98  --  99 108  CO2 25  --  27 24  GLUCOSE 119*  --  103* 100*  BUN 17  --  16 16  CREATININE 0.62  --  0.54 0.61  CALCIUM  9.5  --  9.0 8.7*  MG  --  2.2 2.4  --   PHOS  --  3.7 3.6  --    Liver Function Tests: Recent Labs  Lab 12/07/23 1648 12/08/23 0025  AST 18 14*  ALT 19 17  ALKPHOS 35* 27*  BILITOT 1.2 1.0  PROT 7.2 5.9*  ALBUMIN 4.0 3.3*   Recent Labs  Lab 12/07/23 1648  LIPASE 30   Recent Labs  Lab 12/07/23 1734  AMMONIA 14   CBC: Recent Labs  Lab 12/07/23 1648 12/08/23 0025  WBC 5.9 4.3  NEUTROABS 4.0  --   HGB 13.0 11.2*  HCT 39.8 34.7*  MCV 95.7 95.6  PLT 152 127*   Cardiac Enzymes: Recent Labs  Lab 12/08/23 0025  CKTOTAL 62   BNP: Invalid input(s): POCBNP CBG: No results for input(s): GLUCAP in the last 168 hours. D-Dimer No results for input(s): DDIMER in the last 72 hours. Hgb A1c No results for input(s): HGBA1C in the last 72 hours. Lipid Profile No results for input(s): CHOL, HDL, LDLCALC, TRIG, CHOLHDL, LDLDIRECT in the last 72 hours. Thyroid  function studies No results for input(s): TSH, T4TOTAL, T3FREE, THYROIDAB in the last 72 hours.  Invalid input(s): FREET3 Anemia work up No results for input(s): VITAMINB12, FOLATE, FERRITIN, TIBC, IRON, RETICCTPCT in the last 72 hours. Urinalysis    Component Value Date/Time   COLORURINE YELLOW 12/07/2023 1849   APPEARANCEUR CLOUDY (A) 12/07/2023 1849   LABSPEC 1.016 12/07/2023 1849   PHURINE 6.0 12/07/2023 1849   GLUCOSEU NEGATIVE 12/07/2023 1849   GLUCOSEU NEGATIVE 10/09/2014 1414   HGBUR NEGATIVE  12/07/2023 1849   BILIRUBINUR NEGATIVE 12/07/2023 1849   BILIRUBINUR negative 11/12/2023 1325   KETONESUR 5 (A) 12/07/2023 1849   PROTEINUR 100 (A) 12/07/2023 1849   UROBILINOGEN 0.2 11/12/2023 1325   UROBILINOGEN 0.2 10/09/2014 1414   NITRITE NEGATIVE 12/07/2023 1849   LEUKOCYTESUR TRACE (A) 12/07/2023 1849   Sepsis Labs  Recent Labs  Lab 12/07/23 1648 12/08/23 0025  WBC 5.9 4.3   Microbiology Recent Results (from the past 240 hours)  Urine Culture (for pregnant, neutropenic or urologic patients or patients with an indwelling urinary catheter)     Status: Abnormal   Collection Time: 12/07/23  6:49 PM   Specimen: Urine, Clean Catch  Result Value Ref Range Status   Specimen Description   Final    URINE, CLEAN CATCH Performed at [facility], [address], [place], [place] [address]    Special Requests   Final    NONE Performed at [facility], [address] [place], [place] [address]    Culture MULTIPLE SPECIES PRESENT, SUGGEST RECOLLECTION (A)  Final   Report Status 12/09/2023 FINAL  Final     Time coordinating discharge: Over 30 minutes  SIGNED:   Camellia PARAS [place], DO  [facility] 12/09/2023, 10:08 AM

## 2023-12-13 ENCOUNTER — Ambulatory Visit: Admitting: Urology

## 2024-01-04 DIAGNOSIS — N3020 Other chronic cystitis without hematuria: Secondary | ICD-10-CM | POA: Diagnosis not present

## 2024-01-04 DIAGNOSIS — R351 Nocturia: Secondary | ICD-10-CM | POA: Diagnosis not present

## 2024-01-04 DIAGNOSIS — R3914 Feeling of incomplete bladder emptying: Secondary | ICD-10-CM | POA: Diagnosis not present

## 2024-01-10 DIAGNOSIS — F32A Depression, unspecified: Secondary | ICD-10-CM | POA: Diagnosis not present

## 2024-01-10 DIAGNOSIS — R296 Repeated falls: Secondary | ICD-10-CM | POA: Diagnosis not present

## 2024-01-10 DIAGNOSIS — H548 Legal blindness, as defined in USA: Secondary | ICD-10-CM | POA: Diagnosis not present

## 2024-01-10 DIAGNOSIS — R413 Other amnesia: Secondary | ICD-10-CM | POA: Diagnosis not present

## 2024-01-25 DIAGNOSIS — M19012 Primary osteoarthritis, left shoulder: Secondary | ICD-10-CM | POA: Diagnosis not present

## 2024-01-25 DIAGNOSIS — M25511 Pain in right shoulder: Secondary | ICD-10-CM | POA: Diagnosis not present

## 2024-02-06 DIAGNOSIS — H53413 Scotoma involving central area, bilateral: Secondary | ICD-10-CM | POA: Diagnosis not present

## 2024-02-14 ENCOUNTER — Encounter (HOSPITAL_COMMUNITY): Payer: Self-pay | Admitting: Emergency Medicine

## 2024-02-14 ENCOUNTER — Emergency Department (HOSPITAL_COMMUNITY)

## 2024-02-14 ENCOUNTER — Observation Stay (HOSPITAL_COMMUNITY)
Admission: EM | Admit: 2024-02-14 | Discharge: 2024-02-16 | Disposition: A | Discharge status: Home / Self Care | Attending: Family Medicine | Admitting: Family Medicine

## 2024-02-14 ENCOUNTER — Other Ambulatory Visit: Payer: Self-pay

## 2024-02-14 DIAGNOSIS — G9341 Metabolic encephalopathy: Secondary | ICD-10-CM | POA: Diagnosis not present

## 2024-02-14 DIAGNOSIS — R410 Disorientation, unspecified: Secondary | ICD-10-CM | POA: Diagnosis not present

## 2024-02-14 DIAGNOSIS — R404 Transient alteration of awareness: Secondary | ICD-10-CM | POA: Diagnosis not present

## 2024-02-14 DIAGNOSIS — R1111 Vomiting without nausea: Secondary | ICD-10-CM | POA: Diagnosis not present

## 2024-02-14 DIAGNOSIS — I10 Essential (primary) hypertension: Secondary | ICD-10-CM | POA: Diagnosis not present

## 2024-02-14 DIAGNOSIS — E785 Hyperlipidemia, unspecified: Secondary | ICD-10-CM | POA: Insufficient documentation

## 2024-02-14 DIAGNOSIS — G9340 Encephalopathy, unspecified: Secondary | ICD-10-CM | POA: Diagnosis not present

## 2024-02-14 DIAGNOSIS — R4182 Altered mental status, unspecified: Secondary | ICD-10-CM | POA: Diagnosis not present

## 2024-02-14 DIAGNOSIS — K219 Gastro-esophageal reflux disease without esophagitis: Secondary | ICD-10-CM | POA: Insufficient documentation

## 2024-02-14 DIAGNOSIS — H9190 Unspecified hearing loss, unspecified ear: Secondary | ICD-10-CM | POA: Insufficient documentation

## 2024-02-14 DIAGNOSIS — R519 Headache, unspecified: Secondary | ICD-10-CM | POA: Diagnosis not present

## 2024-02-14 DIAGNOSIS — R9082 White matter disease, unspecified: Secondary | ICD-10-CM | POA: Diagnosis not present

## 2024-02-14 DIAGNOSIS — H548 Legal blindness, as defined in USA: Secondary | ICD-10-CM | POA: Diagnosis not present

## 2024-02-14 DIAGNOSIS — N390 Urinary tract infection, site not specified: Secondary | ICD-10-CM | POA: Insufficient documentation

## 2024-02-14 DIAGNOSIS — G629 Polyneuropathy, unspecified: Secondary | ICD-10-CM | POA: Insufficient documentation

## 2024-02-14 DIAGNOSIS — K5900 Constipation, unspecified: Secondary | ICD-10-CM | POA: Insufficient documentation

## 2024-02-14 LAB — COMPREHENSIVE METABOLIC PANEL WITH GFR
ALT: 24 U/L (ref 0–44)
AST: 17 U/L (ref 15–41)
Albumin: 3.9 g/dL (ref 3.5–5.0)
Alkaline Phosphatase: 46 U/L (ref 38–126)
Anion gap: 12 (ref 5–15)
BUN: 14 mg/dL (ref 8–23)
CO2: 25 mmol/L (ref 22–32)
Calcium: 9.2 mg/dL (ref 8.9–10.3)
Chloride: 101 mmol/L (ref 98–111)
Creatinine, Ser: 0.59 mg/dL (ref 0.44–1.00)
GFR, Estimated: >60 mL/min (ref >60)
Glucose, Bld: 115 mg/dL — ABNORMAL HIGH (ref 70–99)
Potassium: 3.6 mmol/L (ref 3.5–5.1)
Sodium: 138 mmol/L (ref 135–145)
Total Bilirubin: 0.6 mg/dL (ref 0.0–1.2)
Total Protein: 6.4 g/dL — ABNORMAL LOW (ref 6.5–8.1)

## 2024-02-14 LAB — CBC WITH DIFFERENTIAL/PLATELET
Abs Immature Granulocytes: 0.04 K/uL (ref 0.00–0.07)
Basophils Absolute: 0 K/uL (ref 0.0–0.1)
Basophils Relative: 0 %
Eosinophils Absolute: 0 K/uL (ref 0.0–0.5)
Eosinophils Relative: 0 %
HCT: 37.8 % (ref 36.0–46.0)
Hemoglobin: 12.1 g/dL (ref 12.0–15.0)
Immature Granulocytes: 1 %
Lymphocytes Relative: 12 %
Lymphs Abs: 0.8 K/uL (ref 0.7–4.0)
MCH: 30.6 pg (ref 26.0–34.0)
MCHC: 32 g/dL (ref 30.0–36.0)
MCV: 95.5 fL (ref 80.0–100.0)
Monocytes Absolute: 0.3 K/uL (ref 0.1–1.0)
Monocytes Relative: 5 %
Neutro Abs: 5.3 K/uL (ref 1.7–7.7)
Neutrophils Relative %: 82 %
Platelets: 157 K/uL (ref 150–400)
RBC: 3.96 MIL/uL (ref 3.87–5.11)
RDW: 13.2 % (ref 11.5–15.5)
WBC: 6.4 K/uL (ref 4.0–10.5)
nRBC: 0 % (ref 0.0–0.2)

## 2024-02-14 LAB — URINALYSIS, ROUTINE W REFLEX MICROSCOPIC
Bacteria, UA: NONE SEEN
Bilirubin Urine: NEGATIVE
Glucose, UA: 50 mg/dL — AB
Ketones, ur: NEGATIVE mg/dL
Leukocytes,Ua: NEGATIVE
Nitrite: NEGATIVE
Protein, ur: 100 mg/dL — AB
Specific Gravity, Urine: 1.019 (ref 1.005–1.030)
pH: 6 (ref 5.0–8.0)

## 2024-02-14 LAB — CBG MONITORING, ED: Glucose-Capillary: 101 mg/dL — ABNORMAL HIGH (ref 70–99)

## 2024-02-14 MED ORDER — SODIUM CHLORIDE 0.9 % IV SOLN
1.0000 g | Freq: Once | INTRAVENOUS | Status: AC
  Administered 2024-02-14: 1 g via INTRAVENOUS
  Filled 2024-02-14: qty 10

## 2024-02-14 MED ORDER — ACETAMINOPHEN 325 MG PO TABS
650.0000 mg | ORAL_TABLET | Freq: Four times a day (QID) | ORAL | Status: DC | PRN
  Administered 2024-02-14: 650 mg via ORAL
  Filled 2024-02-14: qty 2

## 2024-02-14 MED ORDER — POTASSIUM CHLORIDE IN NACL 20-0.9 MEQ/L-% IV SOLN
INTRAVENOUS | Status: DC
  Filled 2024-02-14: qty 1000

## 2024-02-14 MED ORDER — SODIUM CHLORIDE 0.9 % IV SOLN
1.0000 g | Freq: Every day | INTRAVENOUS | Status: DC
  Administered 2024-02-15 – 2024-02-16 (×2): 1 g via INTRAVENOUS
  Filled 2024-02-14 (×2): qty 10

## 2024-02-14 MED ORDER — ONDANSETRON HCL 4 MG/2ML IJ SOLN: 4.0000 mg | Freq: Four times a day (QID) | INTRAMUSCULAR | Status: DC | PRN

## 2024-02-14 MED ORDER — ONDANSETRON HCL 4 MG PO TABS: 4.0000 mg | ORAL_TABLET | Freq: Four times a day (QID) | ORAL | Status: DC | PRN

## 2024-02-14 MED ORDER — SODIUM CHLORIDE 0.9 % IV SOLN: INTRAVENOUS | Status: DC

## 2024-02-14 MED ORDER — HYDRALAZINE HCL 25 MG PO TABS
25.0000 mg | ORAL_TABLET | Freq: Four times a day (QID) | ORAL | Status: DC | PRN
  Administered 2024-02-15: 25 mg via ORAL
  Filled 2024-02-14: qty 1

## 2024-02-14 MED ORDER — HYDRALAZINE HCL 20 MG/ML IJ SOLN: 10.0000 mg | INTRAMUSCULAR | Status: DC | PRN

## 2024-02-14 MED ORDER — CEFAZOLIN SODIUM-DEXTROSE 1-4 GM/50ML-% IV SOLN: 1.0000 g | Freq: Once | INTRAVENOUS | Status: DC

## 2024-02-14 MED ORDER — ACETAMINOPHEN 650 MG RE SUPP: 650.0000 mg | Freq: Four times a day (QID) | RECTAL | Status: DC | PRN

## 2024-02-14 MED ORDER — ENOXAPARIN SODIUM 40 MG/0.4ML IJ SOSY
40.0000 mg | PREFILLED_SYRINGE | INTRAMUSCULAR | Status: DC
  Administered 2024-02-14 – 2024-02-15 (×2): 40 mg via SUBCUTANEOUS
  Filled 2024-02-14 (×2): qty 0.4

## 2024-02-14 NOTE — ED Triage Notes (Signed)
 Patient BIB EMS for evaluation of altered mental status.  Patient lives at home and has home health.  This morning when aid arrived the door was locked and she was found in a fetal position in the bed.  Aid reported to EMS that normally she is up and has the door unlocked prior to his arrival.  Appears more confused and asking for her husband that passed away.  At baseline, patient does not normally ask for him because she normally knows he passed away.  Hx of UTI.  At baseline patient is alert and oriented and able to ambulate with a walker.

## 2024-02-14 NOTE — ED Notes (Signed)
 Patient transported to floor via transport at this time

## 2024-02-14 NOTE — ED Notes (Signed)
 Patient complaining of HA.  Medicated per PRN order

## 2024-02-14 NOTE — ED Provider Notes (Signed)
 [place] EMERGENCY DEPARTMENT AT [facility] Provider Note   CSN: [id number] Arrival date & time: 02/14/24  1108     Patient presents with: Altered Mental Status   Kathryn Romero is a 88 y.o. female.   HPI Patient presents from home with concern for altered mental status.  Patient has multiple medical issues, including recurrent urinary tract infection, was hospitalized almost 1 month ago for somewhat similar presentation. Caregiver reports the patient was found on the ground today, no obvious fall, trauma, patient was too weak to get herself up, though she typically can ambulate with a walker and assistance.  Patient with legal blindness, deafness. Patient states that she feels terrible, cannot elaborate further.  EMS reports patient was hemodynamically unremarkable in transport.    Prior to Admission medications   Medication Sig Start Date End Date Taking? Authorizing Provider  Alpha-Lipoic Acid 600 MG CAPS Take 600 mg by mouth at bedtime.    [provider]  Cholecalciferol  (VITAMIN D3) 50 MCG (2000 UT) TABS Take 2,000 Units by mouth daily with breakfast.    [provider]  ferrous sulfate  325 (65 FE) MG tablet Take 325 mg by mouth daily with breakfast.    [provider]  Ibuprofen (ADVIL) 200 MG CAPS Take 400 mg by mouth at bedtime.    [provider]  Magnesium  Bisglycinate (MAG GLYCINATE PO) Take 1 capsule by mouth at bedtime.    [provider]  pantoprazole  (PROTONIX ) 40 MG tablet Take 40 mg by mouth 2 (two) times daily before a meal. 05/24/23   [provider]  psyllium (HYDROCIL/METAMUCIL) 95 % PACK Take 1 packet by mouth daily.    [provider]  VITAMIN E BLEND PO Take 1 capsule by mouth in the morning.    [provider]    Allergies: Codeine, Fish allergy, Hydromet [hydrocodone  bit-homatrop mbr], Shellfish allergy, Shrimp extract, Cephalexin , Morphine, and Sulfa antibiotics     Review of Systems  Updated Vital Signs BP (!) 121/55   Pulse 73   Temp 98.1 F (36.7 C) (Oral)   Resp 14   Ht 1.575 m (5' 2)   Wt 75.8 kg   SpO2 95%   BMI 30.54 kg/m   Physical Exam Vitals and nursing note reviewed.  Constitutional:      Appearance: She is well-developed. She is obese. She is ill-appearing.  HENT:     Head: Normocephalic and atraumatic.  Eyes:     Conjunctiva/sclera: Conjunctivae normal.  Cardiovascular:     Rate and Rhythm: Normal rate and regular rhythm.  Pulmonary:     Effort: Pulmonary effort is normal. No respiratory distress.     Breath sounds: Normal breath sounds. No stridor.  Abdominal:     General: There is no distension.  Skin:    General: Skin is warm and dry.  Neurological:     Mental Status: She is oriented to person, place, and time.     Cranial Nerves: No cranial nerve deficit.     Motor: Atrophy present.     Comments: Very poor hearing, and patient is legally blind, otherwise cranial nerves unremarkable.  Psychiatric:        Behavior: Behavior is slowed and withdrawn.     (all labs ordered are listed, but only abnormal results are displayed) Labs Reviewed  COMPREHENSIVE METABOLIC PANEL WITH GFR - Abnormal; Notable for the following components:      Result Value   Glucose, Bld 115 (*)    Total  Protein 6.4 (*)    All other components within normal limits  URINALYSIS, ROUTINE W REFLEX MICROSCOPIC - Abnormal; Notable for the following components:   Glucose, UA 50 (*)    Hgb urine dipstick SMALL (*)    Protein, ur 100 (*)    All other components within normal limits  CBG MONITORING, ED - Abnormal; Notable for the following components:   Glucose-Capillary 101 (*)    All other components within normal limits  CBC WITH DIFFERENTIAL/PLATELET    EKG: EKG Interpretation Date/Time:  Tuesday February 14 2024 11:51:25 EDT Ventricular Rate:  81 PR Interval:  181 QRS Duration:  88 QT Interval:  395 QTC Calculation: 459 R  Axis:   -25  Text Interpretation: Sinus rhythm Borderline left axis deviation Abnormal R-wave progression, late transition Confirmed by Garrick Charleston [phone]) on 02/14/2024 11:54:02 AM  Radiology: [facility] Chest Port 1 View Result Date: 02/14/2024 EXAM: 1 VIEW XRAY OF THE CHEST 02/14/2024 12:16:00 PM COMPARISON: Portable chest x-ray dated 12/07/2023 and earlier. CLINICAL HISTORY: 88 year old female. ALOC. Evaluation of altered mental status. FINDINGS: LUNGS AND PLEURA: No focal pulmonary opacity. No pulmonary edema. No pleural effusion. No pneumothorax. HEART AND MEDIASTINUM: No acute abnormality of the cardiac and mediastinal silhouettes. BONES AND SOFT TISSUES: No acute osseous abnormality. IMPRESSION: 1. No acute cardiopulmonary abnormality. Electronically signed by: Helayne Hurst MD 02/14/2024 12:50 PM EDT RP Workstation: [id number]   CT HEAD WO CONTRAST Result Date: 02/14/2024 EXAM: CT HEAD WITHOUT CONTRAST 02/14/2024 12:03:00 PM TECHNIQUE: CT of the head was performed without the administration of intravenous contrast. Automated exposure control, iterative reconstruction, and/or weight based adjustment of the mA/kV was utilized to reduce the radiation dose to as low as reasonably achievable. COMPARISON: CT head 12/07/2023. CLINICAL HISTORY: 88 year old female. Headache, increasing frequency or severity. AMS. FINDINGS: BRAIN AND VENTRICLES: No acute hemorrhage. No evidence of acute infarct. No hydrocephalus. No extra-axial collection. No mass effect or midline shift. Stable cerebral volume. Patchy and confluent bilateral cerebral white matter hypodensity is stable with some deep white matter capsule involvement as before. No suspicious intracranial vascular hyperdensity. Calcified atherosclerosis of the skull base. ORBITS: No acute abnormality. SINUSES: No acute abnormality. SOFT TISSUES AND SKULL: No acute soft tissue abnormality. No skull fracture. IMPRESSION: 1. No acute intracranial abnormality. 2. Stable  chronic white matter disease. Electronically signed by: Helayne Hurst MD 02/14/2024 12:49 PM EDT RP Workstation: [id number]     Procedures   Medications Ordered in the ED  0.9 %  sodium chloride  infusion ( Intravenous New Bag/Given 02/14/24 1259)                                    Medical Decision Making Elderly female with multiple medical issues including recurrent urinary tract infection, recent hospitalization for sepsis with altered mental status, now presents with weakness, altered mental status from home.  Broad differential including the aforementioned processes, intracranial abnormality, trauma from fall. Cardiac 75 sinus normal pulse ox 95% room air normal  Patient with primary care urine culture result from 1 week ago with Klebsiella pneumonia on urinalysis, sensitive to cephalosporin.  Amount and/or Complexity of Data Reviewed Independent Historian: EMS External Data Reviewed: notes.    Details: Notes from recent hospitalization reviewed, similar presentation to today, at that point patient found to have urinary tract infection on culture. Labs: ordered. Radiology: ordered. ECG/medicine tests: ordered and independent interpretation performed. Decision-making details documented in ED  Course.  Risk Prescription drug management. Decision regarding hospitalization. Diagnosis or treatment significantly limited by social determinants of health.   2:28 PM Patient more awake, though globally weak on exam.  On reviewing his chart, as above patient with positive urine culture result 1 week ago, sensitivity to cephalosporin noted, this medication was ordered, urine culture sent fresh here. With concern for encephalopathy, though her initial labs reassuring, vitals are reassuring, possible occult urinary tract infection patient be admitted for further monitoring, management.   Final diagnoses:  Acute encephalopathy     Garrick Charleston, MD 02/14/24 1552

## 2024-02-14 NOTE — ED Notes (Signed)
 Patient reports she is legally blind.  Her aid is at her home to help her with visually.

## 2024-02-14 NOTE — ED Notes (Signed)
 Patient repositioned in bed for comfort.  Pillow placed under lower back per request

## 2024-02-14 NOTE — H&P (Signed)
 History and Physical    Patient: Kathryn Romero FMW:[id number] DOB: [birth year] DOA: 02/14/2024 DOS: the patient was seen and examined on 02/14/2024 PCP: Janey Santos, MD  Patient coming from: Home  Chief Complaint:  Chief Complaint  Patient presents with   Altered Mental Status   HPI: Kathryn Romero is a 88 y.o. female with medical history significant of anemia NOS, anxiety, aortic atherosclerosis, arthritis, colon cancer, chronic back pain, diverticulosis, fatty liver disease, GERD, herpes zoster, hyperlipidemia, hypertension, legally blind, retinitis pigmentosa, nausea and emesis, nocturia, carpal tunnel syndrome who was brought to the emergency department due to AMS.  Her home aide said that when she arrived the door was locked, she was found in bed in the fetal position.  Normally, she is up and her door is unlocked prior to her aide's arrival.  The patient was confused and asking for her husband who has already passed away.  She was diagnosed with an UTI recently by [facility] with a culture growing a sensitive strain of Klebsiella.  She seems to be getting better on her mental status as she is already answering simple questions and know that she is at [facility], but was unaware of the time, date or situation.  No headache, chest, back or abdominal pain.  She complained that she was feeling cold.  Lab work: Urinalysis showed glucose of 50 and protein 100 mg/dL.  There was small hemoglobin, 21-50 RBC, 0-5 WBC and no bacteria.  Her CBC was normal.  CMP showed a glucose of 115 mg/dL and total protein of 6.4 g/dL, the rest of the CMP measurements were normal.  Imaging: Portable 1 view chest radiograph with no acute cardiopulmonary findings.  CT head without contrast no acute intracranial normality.  Stable chronic white matter disease.   ED course: Initial vital signs were temperature 98.1 F, pulse 87, respiration 18, BP 142/61 mmHg and O2 sat 92% on room  air.  The patient received ceftriaxone  1 g IVPB and was started on normal saline at 125 mL/h for 1 day.  Review of Systems: As mentioned in the history of present illness. All other systems reviewed and are negative. Past Medical History:  Diagnosis Date   ANEMIA-NOS    ANXIETY    Aortic atherosclerosis    Arthritis    Cancer (HCC) 2005   colon cancer   Chronic back pain    spondylolisthesis   Diverticulosis    Fatty liver    GERD (gastroesophageal reflux disease)    History of blood transfusion    no abnormal reaction noted   History of colon cancer 2004   History of shingles    HYPERLIPIDEMIA    takes Pravastatin  daily   Hypertension    Legally blind    Nausea & vomiting 02/20/2022   Nocturia    RETINITIS PIGMENTOSA    SYNDROME, CARPAL TUNNEL    Weakness    numbness and tingling in legs and feet   Past Surgical History:  Procedure Laterality Date   ABDOMINAL HYSTERECTOMY  1989   ANTERIOR LAT LUMBAR FUSION Right 01/06/2018   Procedure: Right Lumbar Two-Three Anterolateral lumbar interbody fusion with lateral plate;  Surgeon: Unice Pac, MD;  Location: [facility] OR;  Service: Neurosurgery;  Laterality: Right;  Right Lumbar Two-Three Anterolateral lumbar interbody fusion with lateral plate   APPENDECTOMY  1946   carapl tunnel release Left    cataract surgery Bilateral    COLONOSCOPY     MAXIMUM ACCESS (MAS)POSTERIOR LUMBAR INTERBODY FUSION (PLIF)  2 LEVEL N/A 11/14/2015   Procedure: Lumbar Four-Five Maximum access posterior lumbar interbody fusion, Lumbar Three-Four Lumbar Four-Five Posterolateral Fusion and Pedicle Screws;  Surgeon: Fairy Levels, MD;  Location: [facility];  Service: Neurosurgery;  Laterality: N/A;  L3-4 L4-5 Maximum access posterior lumbar interbody fusion   OOPHORECTOMY  1989   s/p ganglion cyst  1973   s/p right hemicolectomy  12 yrs ago   Social History:  reports that she has never smoked. She has never used smokeless tobacco. She reports that she does not  drink alcohol  and does not use drugs.  Allergies  Allergen Reactions   Codeine Shortness Of Breath and Other (See Comments)    Heart racing, too   Fish Allergy Anaphylaxis, Itching and Other (See Comments)    Felt like she was on fire/burning, headache, itching- also   Hydromet [Hydrocodone  Bit-Homatrop Mbr] Shortness Of Breath and Other (See Comments)    Wheezing, also   Shellfish Allergy Anaphylaxis, Itching and Other (See Comments)    Felt like she was on fire/burning, headache, itching- also   Shrimp Extract Anaphylaxis, Itching, Rash and Other (See Comments)    Felt like she was on fire/burning, headache, itching- also   Cephalexin  Nausea Only and Other (See Comments)    Tolerates IV Rocephin    Morphine Other (See Comments)    Felt funny and made her crazy     Sulfa Antibiotics Nausea And Vomiting         Family History  Problem Relation Age of Onset   Breast cancer Maternal Aunt    ALS Cousin    Polymyalgia rheumatica Cousin    Colon cancer Neg Hx     Prior to Admission medications   Medication Sig Start Date End Date Taking? Authorizing Provider  Alpha-Lipoic Acid 600 MG CAPS Take 600 mg by mouth at bedtime.    [provider]  Cholecalciferol  (VITAMIN D3) 50 MCG (2000 UT) TABS Take 2,000 Units by mouth daily with breakfast.    [provider]  ferrous sulfate  325 (65 FE) MG tablet Take 325 mg by mouth daily with breakfast.    [provider]  Ibuprofen (ADVIL) 200 MG CAPS Take 400 mg by mouth at bedtime.    [provider]  Magnesium  Bisglycinate (MAG GLYCINATE PO) Take 1 capsule by mouth at bedtime.    [provider]  pantoprazole  (PROTONIX ) 40 MG tablet Take 40 mg by mouth 2 (two) times daily before a meal. 05/24/23   [provider]  psyllium (HYDROCIL/METAMUCIL) 95 % PACK Take 1 packet by mouth daily.    [provider]  VITAMIN E BLEND PO Take 1 capsule by mouth in the morning.    [provider]    Physical Exam: Vitals:   02/14/24 1125 02/14/24 1215 02/14/24 1230 02/14/24 1300  BP:  135/69 (!) 147/67 (!) 121/55  Pulse:  75 76 73  Resp:  16 13 14   Temp:      TempSrc:      SpO2:  94% 97% 95%  Weight: 75.8 kg     Height: 5' 2 (1.575 m)      Physical Exam Vitals and nursing note reviewed.  Constitutional:      General: She is awake. She is not in acute distress.    Appearance: She is obese. She is ill-appearing.  HENT:     Head: Normocephalic.     Nose: No rhinorrhea.     Mouth/Throat:     Mouth:  Mucous membranes are dry.  Eyes:     General: No scleral icterus.    Pupils: Pupils are equal, round, and reactive to light.  Cardiovascular:     Rate and Rhythm: Normal rate and regular rhythm.     Heart sounds: S1 normal and S2 normal.  Pulmonary:     Effort: Pulmonary effort is normal.     Breath sounds: Normal breath sounds. No wheezing, rhonchi or rales.  Abdominal:     General: Bowel sounds are normal. There is no distension.     Palpations: Abdomen is soft.     Tenderness: There is no abdominal tenderness. There is no right CVA tenderness or left CVA tenderness.  Musculoskeletal:     Cervical back: Neck supple.     Right lower leg: No edema.     Left lower leg: No edema.  Skin:    General: Skin is warm and dry.  Neurological:     Mental Status: She is alert. Mental status is at baseline. She is disoriented.  Psychiatric:        Mood and Affect: Mood normal.        Behavior: Behavior is cooperative.     Data Reviewed:  Results are pending, will review when available. 11/05/2014  --------------------  Study Conclusions   - Left ventricle: The cavity size was normal. Wall thickness was    increased in a pattern of mild LVH. Systolic function was normal.    The estimated ejection fraction was in the range of 60% to 65%.    Wall motion was normal; there were no regional wall motion    abnormalities. Doppler parameters are consistent with  abnormal    left ventricular relaxation (grade 1 diastolic dysfunction). The    E/e&' ratio is between 8-15, suggesting indeterminate LV filling    pressure.  - Mitral valve: Mildly thickened leaflets . There was trivial    regurgitation.  - Left atrium: The atrium was normal in size.  - Right atrium: The atrium was mildly dilated.  - Inferior vena cava: The vessel was normal in size. The    respirophasic diameter changes were in the normal range (>= 50%),    consistent with normal central venous pressure.   Impressions:   - LVEF 60-65%, mild LVH, normal wall motion, diastolic dysfunction,    indeterminate LV filling pressure, mild RAE, normal IVC size.   EKG: Vent. rate 81 BPM PR interval 181 ms QRS duration 88 ms QT/QTcB 395/459 ms P-R-T axes 8 -25 23 Sinus rhythm Borderline left axis deviation Abnormal R-wave progression, late transition  Assessment and Plan: Principal Problem:   Acute metabolic encephalopathy In the setting of:   UTI (urinary tract infection) Admit to PCU/inpatient. Continue IV fluids. Continue ceftriaxone  1 g IVPB daily. Follow-up urine culture and sensitivity. Follow-up CBC and chemistry in the morning.  Active Problems:   Dyslipidemia No longer on Plavix.   Follow-up with PCP.    Essential hypertension Not on daily medication. Will use oral hydralazine as needed while in the hospital.    Gastro-esophageal reflux disease without esophagitis Continue pantoprazole  40 mg p.o. daily.    Legal blindness Supportive care as needed.    Advance Care Planning:   Code Status: Full Code   Consults:   Family Communication:   Severity of Illness: The appropriate patient status for this patient is INPATIENT. Inpatient status is judged to be reasonable and necessary in order to provide the required intensity of service to ensure the  patient's safety. The patient's presenting symptoms, physical exam findings, and initial radiographic and laboratory  data in the context of their chronic comorbidities is felt to place them at high risk for further clinical deterioration. Furthermore, it is not anticipated that the patient will be medically stable for discharge from the hospital within 2 midnights of admission.   * I certify that at the point of admission it is my clinical judgment that the patient will require inpatient hospital care spanning beyond 2 midnights from the point of admission due to high intensity of service, high risk for further deterioration and high frequency of surveillance required.*  Author: Alm Dorn Castor, MD 02/14/2024 2:39 PM  For on call review [email or web address].   This document was prepared using Dragon voice recognition software and may contain some unintended transcription errors.

## 2024-02-14 NOTE — ED Notes (Signed)
Patient assisted to use bedpan at this time.

## 2024-02-14 NOTE — ED Notes (Signed)
 Patient hygiene preformed.  Placed in new gown and adult brief.  Warm blankets provided.

## 2024-02-14 NOTE — ED Notes (Signed)
Patient assisted to use bedpan.

## 2024-02-14 NOTE — ED Notes (Signed)
 Patient taken to CT at this time.

## 2024-02-14 NOTE — ED Notes (Signed)
Patient provided with warm blankets per request 

## 2024-02-14 NOTE — ED Notes (Signed)
 Patient assisted to use bedpan.  Clean adult brief placed.  Patient provided with warm blankets

## 2024-02-15 DIAGNOSIS — N3001 Acute cystitis with hematuria: Secondary | ICD-10-CM | POA: Diagnosis not present

## 2024-02-15 DIAGNOSIS — H548 Legal blindness, as defined in USA: Secondary | ICD-10-CM

## 2024-02-15 DIAGNOSIS — G9341 Metabolic encephalopathy: Secondary | ICD-10-CM | POA: Diagnosis not present

## 2024-02-15 LAB — COMPREHENSIVE METABOLIC PANEL WITH GFR
ALT: 19 U/L (ref 0–44)
AST: 15 U/L (ref 15–41)
Albumin: 3.6 g/dL (ref 3.5–5.0)
Alkaline Phosphatase: 41 U/L (ref 38–126)
Anion gap: 11 (ref 5–15)
BUN: 16 mg/dL (ref 8–23)
CO2: 25 mmol/L (ref 22–32)
Calcium: 9.1 mg/dL (ref 8.9–10.3)
Chloride: 105 mmol/L (ref 98–111)
Creatinine, Ser: 0.58 mg/dL (ref 0.44–1.00)
GFR, Estimated: >60 mL/min (ref >60)
Glucose, Bld: 89 mg/dL (ref 70–99)
Potassium: 3.3 mmol/L — ABNORMAL LOW (ref 3.5–5.1)
Sodium: 141 mmol/L (ref 135–145)
Total Bilirubin: 0.6 mg/dL (ref 0.0–1.2)
Total Protein: 5.5 g/dL — ABNORMAL LOW (ref 6.5–8.1)

## 2024-02-15 LAB — CBC
HCT: 35.1 % — ABNORMAL LOW (ref 36.0–46.0)
Hemoglobin: 11.1 g/dL — ABNORMAL LOW (ref 12.0–15.0)
MCH: 30.7 pg (ref 26.0–34.0)
MCHC: 31.6 g/dL (ref 30.0–36.0)
MCV: 97 fL (ref 80.0–100.0)
Platelets: 140 K/uL — ABNORMAL LOW (ref 150–400)
RBC: 3.62 MIL/uL — ABNORMAL LOW (ref 3.87–5.11)
RDW: 13.3 % (ref 11.5–15.5)
WBC: 4.7 K/uL (ref 4.0–10.5)
nRBC: 0 % (ref 0.0–0.2)

## 2024-02-15 LAB — URINE CULTURE: Culture: NO GROWTH

## 2024-02-15 LAB — GLUCOSE, CAPILLARY
Glucose-Capillary: 94 mg/dL (ref 70–99)
Glucose-Capillary: 129 mg/dL — ABNORMAL HIGH (ref 70–99)

## 2024-02-15 NOTE — Hospital Course (Signed)
 88 year old woman complex PMH lives at home with home health, was found in bed by home health aide confused and asking for her deceased husband.  Recent diagnosis UTI Klebsiella outpatient.  Admitted for acute metabolic encephalopathy secondary to UTI.  Consultants None  Procedures/Events 10/7 admit for AMS, UTI

## 2024-02-15 NOTE — Progress Notes (Signed)
  Progress Note   Patient: Kathryn Romero FMW:[id number] DOB: February 22, 1936 DOA: 02/14/2024     1 DOS: the patient was seen and examined on 02/15/2024   Brief hospital course: 88 year old woman complex PMH lives at home with home health, was found in bed by home health aide confused and asking for her deceased husband.  Recent diagnosis UTI Klebsiella outpatient.  Admitted for acute metabolic encephalopathy secondary to UTI.  Consultants None  Procedures/Events 10/7 admit for AMS, UTI  Assessment and Plan: Acute metabolic encephalopathy associated with UTI UTI Clinically much improved, encephalopathy appears resolved.  Given reported outpatient failure with oral antibiotics will keep today with IV antibiotics, likely home tomorrow if improved.  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  Constitutional:      General: She is not in acute distress.    Appearance: She is not ill-appearing or toxic-appearing.  Cardiovascular:     Rate and Rhythm: Normal rate and regular rhythm.     Heart sounds: No murmur heard. Pulmonary:     Effort: Pulmonary effort is normal. No respiratory distress.     Breath sounds: No wheezing, rhonchi or rales.  Neurological:     Mental Status: She is alert.  Psychiatric:        Mood and Affect: Mood normal.        Behavior: Behavior normal.     Data Reviewed: Potassium 3.3, remainder CMP unremarkable CBC with modest thrombocytopenia, no leukocytosis Urine culture pending,  hematuria noted  Family Communication: caretaker Shawn at bedside  Disposition: Status is: Inpatient Remains inpatient appropriate because: UTI     Time spent: 25 minutes  Author: Toribio Door, MD 02/15/2024 11:13 AM  For on call review [email or web address].

## 2024-02-15 NOTE — Care Management CC44 (Signed)
 Condition Code 44 Documentation Completed  Patient Details  Name: Kathryn Romero MRN: [id number] Date of Birth: Sep 10, 1935   Condition Code 44 given:  Yes Patient signature on Condition Code 44 notice:  Yes Documentation of 2 MD's agreement:  Yes Code 44 added to claim:  Yes    Duwaine GORMAN Aran, LCSW 02/15/2024, 3:30 PM

## 2024-02-15 NOTE — TOC CM/SW Note (Signed)
 Transition of Care [facility]) - Inpatient Brief Assessment  Patient Details  Name: Kathryn Romero MRN: [id number] Date of Birth: [birth year]  Transition of Care [facility] CM/SW Contact:    Duwaine GORMAN Aran, LCSW Phone Number: 02/15/2024, 3:32 PM  Clinical Narrative: CSW confirmed with niece, [name] Gagnon, that patient is active with PCS through [facility] Monday-Friday 10am-1pm and Saturday/Sunday 4pm-8pm. Per niece, [place] or [place] [phone]) will provide transportation for the patient at discharge unless patient needs to discharge via ambulance. Niece also reported she is working on getting a Personal assistant submitted for the patient. No care management needs identified at this time.  Transition of Care Asessment: Insurance and Status: Insurance coverage has been reviewed Patient has primary care physician: Yes Home environment has been reviewed: Resides alone in single family home Prior level of function:: Independent Prior/Current Home Services: Current home services (PCS through [facility]) Social Drivers of Health Review: SDOH reviewed no interventions necessary Readmission risk has been reviewed: Yes Transition of care needs: no transition of care needs at this time

## 2024-02-15 NOTE — Care Management Obs Status (Signed)
 MEDICARE OBSERVATION STATUS NOTIFICATION   Patient Details  Name: Curstin Schmale MRN: [id number] Date of Birth: July 28, 1935   Medicare Observation Status Notification Given:  Other (see comment) (Mailed to home address: [address] [place] [address])    Duwaine GORMAN Aran, LCSW 02/15/2024, 3:30 PM

## 2024-02-16 DIAGNOSIS — H548 Legal blindness, as defined in USA: Secondary | ICD-10-CM | POA: Diagnosis not present

## 2024-02-16 DIAGNOSIS — N3001 Acute cystitis with hematuria: Secondary | ICD-10-CM | POA: Diagnosis not present

## 2024-02-16 DIAGNOSIS — G9341 Metabolic encephalopathy: Secondary | ICD-10-CM | POA: Diagnosis not present

## 2024-02-16 MED ORDER — POTASSIUM CHLORIDE CRYS ER 20 MEQ PO TBCR: 40.0000 meq | EXTENDED_RELEASE_TABLET | Freq: Once | ORAL | Status: DC

## 2024-02-16 NOTE — TOC CM/SW Note (Signed)
 Niece confirmed on 10/8 she is the patient's POA, not a legal guardian.

## 2024-02-16 NOTE — Discharge Summary (Signed)
 Physician Discharge Summary   Patient: Kathryn Romero MRN: [id number] DOB: March 16, 1936  Admit date:     02/14/2024  Discharge date: 02/16/24  Discharge Physician: Toribio Door   PCP: Janey Santos, MD   Recommendations at discharge:   Care for recurrent UTIs  Discharge Diagnoses: Principal Problem:   Acute metabolic encephalopathy Active Problems:   Dyslipidemia   Essential hypertension   UTI (urinary tract infection)   Legal blindness   Gastro-esophageal reflux disease without esophagitis  Resolved Problems:   * No resolved hospital problems. *  Hospital Course: 88 year old woman complex PMH lives at home with home health, was found in bed by home health aide confused and asking for her deceased husband.  Recent diagnosis UTI Klebsiella outpatient.  Admitted for acute metabolic encephalopathy secondary to UTI.  Condition rapidly improved with fluids and IV antibiotics.  Discharged home in good condition.  Consultants None  Procedures/Events 10/7 admit for AMS, UTI  Acute metabolic encephalopathy associated with UTI UTI Clinically much improved, encephalopathy resolved.  Given reported outpatient failure with oral antibiotics treated for 3 days.  Appears clinically resolved.   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 Hard of hearing  Disposition: Home Diet recommendation:  Regular diet DISCHARGE MEDICATION: Allergies as of 02/16/2024       Reactions   Codeine Shortness Of Breath, Other (See Comments)   Heart racing, too   Fish Allergy Anaphylaxis, Itching, Other (See Comments)   Felt like she was on fire/burning, headache, itching- also   Hydromet [hydrocodone  Bit-homatrop Mbr] Shortness Of Breath, Other (See Comments)   Wheezing, also   Shellfish Allergy Anaphylaxis, Itching, Other (See Comments)   Felt like she was on fire/burning, headache, itching- also   Shrimp Extract Anaphylaxis, Itching, Rash, Other (See  Comments)   Felt like she was on fire/burning, headache, itching- also   Cephalexin  Nausea Only, Other (See Comments)   Tolerates IV Rocephin    Morphine Other (See Comments)   Felt funny and made her crazy   Sulfa Antibiotics Nausea And Vomiting           Medication List     TAKE these medications    Advil 200 MG Caps Generic drug: Ibuprofen Take 400 mg by mouth at bedtime.   Alpha-Lipoic Acid 600 MG Caps Take 600 mg by mouth at bedtime.   MAG GLYCINATE PO Take 1 capsule by mouth at bedtime.   nitrofurantoin  50 MG capsule Commonly known as: MACRODANTIN  Take 50 mg by mouth daily.   NON FORMULARY Apply 1 patch topically See admin instructions. Alena Cassis Brand Pain Relieving Medicated Plaster patches - Apply 1 patch to affected shoulder(s) once a day as needed for pain/remove as directed   psyllium 95 % Pack Commonly known as: HYDROCIL/METAMUCIL Take 1 packet by mouth daily.   Vitamin D3 50 MCG (2000 UT) Tabs Take 2,000 Units by mouth daily with breakfast.   VITAMIN E BLEND PO Take 1 capsule by mouth in the morning.        Follow-up Information     [name], [name], MD. Schedule an appointment as soon as possible for a visit in 2 week(s).   Specialty: Internal Medicine Contact information: [address] [place] [address] [phone]                Discharge Exam: Fredricka Weights   02/14/24 1125  Weight: 75.8 kg   Physical Exam Vitals reviewed.  Constitutional:      General: She is not  in acute distress.    Appearance: She is not ill-appearing or toxic-appearing.  Cardiovascular:     Rate and Rhythm: Normal rate and regular rhythm.     Heart sounds: No murmur heard. Pulmonary:     Effort: Pulmonary effort is normal. No respiratory distress.     Breath sounds: No wheezing, rhonchi or rales.  Neurological:     Mental Status: She is alert.  Psychiatric:        Mood and Affect: Mood normal.        Behavior: Behavior normal.       Condition at discharge: good  The results of significant diagnostics from this hospitalization (including imaging, microbiology, ancillary and laboratory) are listed below for reference.   Imaging Studies: DG Chest Port 1 View Result Date: 02/14/2024 EXAM: 1 VIEW XRAY OF THE CHEST 02/14/2024 12:16:00 PM COMPARISON: Portable chest x-ray dated 12/07/2023 and earlier. CLINICAL HISTORY: 88 year old female. ALOC. Evaluation of altered mental status. FINDINGS: LUNGS AND PLEURA: No focal pulmonary opacity. No pulmonary edema. No pleural effusion. No pneumothorax. HEART AND MEDIASTINUM: No acute abnormality of the cardiac and mediastinal silhouettes. BONES AND SOFT TISSUES: No acute osseous abnormality. IMPRESSION: 1. No acute cardiopulmonary abnormality. Electronically signed by: Helayne Hurst MD 02/14/2024 12:50 PM EDT RP Workstation: [id number]   CT HEAD WO CONTRAST Result Date: 02/14/2024 EXAM: CT HEAD WITHOUT CONTRAST 02/14/2024 12:03:00 PM TECHNIQUE: CT of the head was performed without the administration of intravenous contrast. Automated exposure control, iterative reconstruction, and/or weight based adjustment of the mA/kV was utilized to reduce the radiation dose to as low as reasonably achievable. COMPARISON: CT head 12/07/2023. CLINICAL HISTORY: 88 year old female. Headache, increasing frequency or severity. AMS. FINDINGS: BRAIN AND VENTRICLES: No acute hemorrhage. No evidence of acute infarct. No hydrocephalus. No extra-axial collection. No mass effect or midline shift. Stable cerebral volume. Patchy and confluent bilateral cerebral white matter hypodensity is stable with some deep white matter capsule involvement as before. No suspicious intracranial vascular hyperdensity. Calcified atherosclerosis of the skull base. ORBITS: No acute abnormality. SINUSES: No acute abnormality. SOFT TISSUES AND SKULL: No acute soft tissue abnormality. No skull fracture. IMPRESSION: 1. No acute intracranial  abnormality. 2. Stable chronic white matter disease. Electronically signed by: Helayne Hurst MD 02/14/2024 12:49 PM EDT RP Workstation: [id number]    Microbiology: Results for orders placed or performed during the hospital encounter of 02/14/24  Urine Culture     Status: None   Collection Time: 02/14/24 12:08 PM   Specimen: Urine, Catheterized  Result Value Ref Range Status   Specimen Description   Final    URINE, CATHETERIZED Performed at [facility], [address], [place], [place] [address]    Special Requests   Final    NONE Performed at [facility], [address], [place], [place] [address]    Culture   Final    NO GROWTH Performed at [facility], [address], [place], [place] [address]    Report Status 02/15/2024 FINAL  Final    Labs: CBC: Recent Labs  Lab 02/14/24 1208 02/15/24 0418  WBC 6.4 4.7  NEUTROABS 5.3  --   HGB 12.1 11.1*  HCT 37.8 35.1*  MCV 95.5 97.0  PLT 157 140*   Basic Metabolic Panel: Recent Labs  Lab 02/14/24 1208 02/15/24 0418  NA 138 141  K 3.6 3.3*  CL 101 105  CO2 25 25  GLUCOSE 115* 89  BUN 14 16  CREATININE 0.59 0.58  CALCIUM  9.2 9.1   Liver Function Tests: Recent Labs  Lab 02/14/24 1208 02/15/24 0418  AST 17 15  ALT 24 19  ALKPHOS 46 41  BILITOT 0.6 0.6  PROT 6.4* 5.5*  ALBUMIN 3.9 3.6   CBG: Recent Labs  Lab 02/14/24 1328 02/15/24 0750 02/15/24 1144  GLUCAP 101* 94 129*    Discharge time spent: less than 30 minutes.  Signed: Toribio Door, MD [facility] 02/16/2024

## 2024-02-28 DIAGNOSIS — H53413 Scotoma involving central area, bilateral: Secondary | ICD-10-CM | POA: Diagnosis not present

## 2024-03-12 DIAGNOSIS — M25511 Pain in right shoulder: Secondary | ICD-10-CM | POA: Diagnosis not present

## 2024-03-12 DIAGNOSIS — M19012 Primary osteoarthritis, left shoulder: Secondary | ICD-10-CM | POA: Diagnosis not present

## 2024-03-21 DIAGNOSIS — H53413 Scotoma involving central area, bilateral: Secondary | ICD-10-CM | POA: Diagnosis not present

## 2024-03-26 DIAGNOSIS — R3914 Feeling of incomplete bladder emptying: Secondary | ICD-10-CM | POA: Diagnosis not present

## 2024-03-26 DIAGNOSIS — N3020 Other chronic cystitis without hematuria: Secondary | ICD-10-CM | POA: Diagnosis not present

## 2024-04-04 DIAGNOSIS — H53413 Scotoma involving central area, bilateral: Secondary | ICD-10-CM | POA: Diagnosis not present

## 2024-04-10 DIAGNOSIS — H53413 Scotoma involving central area, bilateral: Secondary | ICD-10-CM | POA: Diagnosis not present

## 2024-04-17 ENCOUNTER — Ambulatory Visit: Admitting: Diagnostic Neuroimaging

## 2024-04-17 ENCOUNTER — Encounter: Payer: Self-pay | Admitting: Diagnostic Neuroimaging

## 2024-04-17 VITALS — BP 127/68 | HR 80

## 2024-04-17 DIAGNOSIS — R413 Other amnesia: Secondary | ICD-10-CM

## 2024-04-17 DIAGNOSIS — R200 Anesthesia of skin: Secondary | ICD-10-CM | POA: Diagnosis not present

## 2024-04-17 DIAGNOSIS — R202 Paresthesia of skin: Secondary | ICD-10-CM | POA: Diagnosis not present

## 2024-04-17 NOTE — Progress Notes (Signed)
 [facility]  PATIENT: Kathryn Romero DOB: 06-14-1935  REFERRING CLINICIAN: [name], [name], MD HISTORY FROM: PATIENT  REASON FOR VISIT: NEW CONSULT   HISTORICAL  CHIEF COMPLAINT:  Chief Complaint  Patient presents with   RM 6     Patient is here with Phyllis Cage for Polyneuropathy - left hand has been over a year and has been getting worse with numbness     HISTORY OF PRESENT ILLNESS:   88 year old female here for evaluation of neuropathy.  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  For past 1 year patient has had onset of left hand numbness which came on gradually.  No progression of symptoms.  No significant pain.  She does have some neck pain on the left side which radiates down the left arm.  Also has some bilateral shoulder pain issues and has seen orthopedic clinic for these.  Patient has also had onset of short-term memory loss, repeating herself, cognitive changes over the past 1 to 2 years.  She is widowed.  She lives alone.  She has home care aides that come to help her from 10 AM to 2 PM.  She has a friend and some support from other people that can help her.  She has 2 nieces that also help out but they live out of town in [place]  in [place].  She ambulates using a walker but this is getting more difficult.   REVIEW OF SYSTEMS: Full 14 system review of systems performed and negative with exception of: as per HPI.  ALLERGIES: Allergies  Allergen Reactions   Codeine Shortness Of Breath and Other (See Comments)    Heart racing, too   Fish Allergy Anaphylaxis, Itching and Other (See Comments)    Felt like she was on fire/burning, headache, itching- also   Hydromet [Hydrocodone  Bit-Homatrop Mbr] Shortness Of Breath and Other (See Comments)    Wheezing, also   Shellfish Allergy  Anaphylaxis, Itching and Other (See Comments)    Felt like she was on fire/burning, headache, itching- also   Shrimp Extract Anaphylaxis, Itching, Rash and Other (See Comments)    Felt like she was on fire/burning, headache, itching- also   Cephalexin  Nausea Only and Other (See Comments)    Tolerates IV Rocephin    Morphine Other (See Comments)    Felt funny and made her crazy     Sulfa Antibiotics Nausea And Vomiting         HOME MEDICATIONS: Outpatient Medications Prior to Visit  Medication Sig Dispense Refill   ADVIL 200 MG CAPS Take 400 mg by mouth at bedtime.     Alpha-Lipoic Acid 600 MG CAPS Take 600 mg by mouth at bedtime.     Cholecalciferol  (VITAMIN D3) 50 MCG (2000 UT) TABS Take 2,000 Units by mouth daily with breakfast.     Magnesium  Bisglycinate (MAG GLYCINATE PO) Take 1 capsule by mouth at bedtime.     nitrofurantoin  (MACRODANTIN ) 50 MG capsule Take 50 mg by mouth daily.     NON FORMULARY Apply 1 patch topically See admin instructions. Alena Cassis Brand Pain Relieving Medicated Plaster patches - Apply 1 patch to affected shoulder(s) once a day as needed for pain/remove as directed     VITAMIN E BLEND PO Take 1 capsule by mouth in the morning.     psyllium (HYDROCIL/METAMUCIL) 95 % PACK Take 1 packet by mouth  daily.     No facility-administered medications prior to visit.    PAST MEDICAL HISTORY: Past Medical History:  Diagnosis Date   ANEMIA-NOS    ANXIETY    Aortic atherosclerosis    Arthritis    Cancer (HCC) 2005   colon cancer   Chronic back pain    spondylolisthesis   Diverticulosis    Fatty liver    GERD (gastroesophageal reflux disease)    History of blood transfusion    no abnormal reaction noted   History of colon cancer 2004   History of shingles    HYPERLIPIDEMIA    takes Pravastatin  daily   Hypertension    Legally blind    Nausea & vomiting 02/20/2022   Nocturia    RETINITIS PIGMENTOSA    SYNDROME, CARPAL TUNNEL    Weakness     numbness and tingling in legs and feet    PAST SURGICAL HISTORY: Past Surgical History:  Procedure Laterality Date   ABDOMINAL HYSTERECTOMY  1989   ANTERIOR LAT LUMBAR FUSION Right 01/06/2018   Procedure: Right Lumbar Two-Three Anterolateral lumbar interbody fusion with lateral plate;  Surgeon: Unice Pac, MD;  Location: [facility] OR;  Service: Neurosurgery;  Laterality: Right;  Right Lumbar Two-Three Anterolateral lumbar interbody fusion with lateral plate   APPENDECTOMY  1946   carapl tunnel release Left    cataract surgery Bilateral    COLONOSCOPY     MAXIMUM ACCESS (MAS)POSTERIOR LUMBAR INTERBODY FUSION (PLIF) 2 LEVEL N/A 11/14/2015   Procedure: Lumbar Four-Five Maximum access posterior lumbar interbody fusion, Lumbar Three-Four Lumbar Four-Five Posterolateral Fusion and Pedicle Screws;  Surgeon: Pac Unice, MD;  Location: [facility];  Service: Neurosurgery;  Laterality: N/A;  L3-4 L4-5 Maximum access posterior lumbar interbody fusion   OOPHORECTOMY  1989   s/p ganglion cyst  1973   s/p right hemicolectomy  12 yrs ago    FAMILY HISTORY: Family History  Problem Relation Age of Onset   Brain cancer Mother    Breast cancer Maternal Aunt    ALS Cousin    Polymyalgia rheumatica Cousin    Colon cancer Neg Hx    Seizures Neg Hx    Stroke Neg Hx    Migraines Neg Hx     SOCIAL HISTORY: Social History   Socioeconomic History   Marital status: Widowed    Spouse name: Not on file   Number of children: Not on file   Years of education: Not on file   Highest education level: Not on file  Occupational History   Not on file  Tobacco Use   Smoking status: Never   Smokeless tobacco: Never  Vaping Use   Vaping status: Never Used  Substance and Sexual Activity   Alcohol  use: No    Alcohol /week: 0.0 standard drinks of alcohol    Drug use: No   Sexual activity: Not on file  Other Topics Concern   Not on file  Social History Narrative   widowed 11/2012   1 - 1/2 cup of coffee weekly  and some soda weekly    Social Drivers of Health   Financial Resource Strain: Not on file  Food Insecurity: No Food Insecurity (02/15/2024)   Hunger Vital Sign    Worried About Running Out of Food in the Last Year: Never true    Ran Out of Food in the Last Year: Never true  Transportation Needs: No Transportation Needs (02/15/2024)   PRAPARE - Administrator, Civil Service (Medical): No  Lack of Transportation (Non-Medical): No  Recent Concern: Transportation Needs - Unmet Transportation Needs (12/07/2023)   PRAPARE - Administrator, Civil Service (Medical): Yes    Lack of Transportation (Non-Medical): Yes  Physical Activity: Not on file  Stress: Not on file  Social Connections: Patient Declined (02/15/2024)   Social Connection and Isolation Panel    Frequency of Communication with Friends and Family: Patient declined    Frequency of Social Gatherings with Friends and Family: Patient declined    Attends Religious Services: Patient declined    Database Administrator or Organizations: Patient declined    Attends Banker Meetings: Patient declined    Marital Status: Patient declined  Intimate Partner Violence: Not At Risk (02/15/2024)   Humiliation, Afraid, Rape, and Kick questionnaire    Fear of Current or Ex-Partner: No    Emotionally Abused: No    Physically Abused: No    Sexually Abused: No     PHYSICAL EXAM  GENERAL EXAM/CONSTITUTIONAL: Vitals:  Vitals:   04/17/24 1110  BP: 127/68  Pulse: 80   There is no height or weight on file to calculate BMI. Wt Readings from Last 3 Encounters:  02/14/24 167 lb (75.8 kg)  10/18/23 167 lb 8.8 oz (76 kg)  06/27/23 167 lb 8.8 oz (76 kg)   Patient is in no distress; well developed, nourished and groomed; neck is supple  CARDIOVASCULAR: Examination of carotid arteries is normal; no carotid bruits Regular rate and rhythm, no murmurs Examination of peripheral vascular system by observation and  palpation is normal  EYES: Ophthalmoscopic exam of optic discs and posterior segments is normal; no papilledema or hemorrhages No results found.  MUSCULOSKELETAL: Gait, strength, tone, movements noted in Neurologic exam below  NEUROLOGIC: MENTAL STATUS:      No data to display         awake, alert, oriented to person, place and time recent and remote memory intact normal attention and concentration language fluent, comprehension intact, naming intact fund of knowledge appropriate  CRANIAL NERVE:  2nd - no papilledema on fundoscopic exam 2nd, 3rd, 4th, 6th - pupils MIN REACTION; MILD LIGHT PERCEPTION; MILD MOTION PERCEPTION; extraocular muscles intact, no nystagmus 5th - facial sensation symmetric 7th - facial strength symmetric 8th - hearing intact 9th - palate elevates symmetrically, uvula midline 11th - shoulder shrug symmetric 12th - tongue protrusion midline  MOTOR:  BUE 3-4; LIMITED BY ARTHRITIS PAIN BLE 2-3 PROX; 3-4 DISTAL  SENSORY:  normal and symmetric to light touch, temperature, vibration; DECR IN FEET  COORDINATION:  finger-nose-finger, fine finger movements normal  REFLEXES:  deep tendon reflexes 1+ and symmetric  GAIT/STATION:  IN WHEELCHAIR     DIAGNOSTIC DATA (LABS, IMAGING, TESTING) - I reviewed patient records, labs, notes, testing and imaging myself where available.  Lab Results  Component Value Date   WBC 4.7 02/15/2024   HGB 11.1 (L) 02/15/2024   HCT 35.1 (L) 02/15/2024   MCV 97.0 02/15/2024   PLT 140 (L) 02/15/2024      Component Value Date/Time   NA 141 02/15/2024 0418   NA 146 11/27/2015 0000   K 3.3 (L) 02/15/2024 0418   CL 105 02/15/2024 0418   CO2 25 02/15/2024 0418   GLUCOSE 89 02/15/2024 0418   BUN 16 02/15/2024 0418   BUN 17 11/27/2015 0000   CREATININE 0.58 02/15/2024 0418   CALCIUM  9.1 02/15/2024 0418   PROT 5.5 (L) 02/15/2024 0418   ALBUMIN 3.6 02/15/2024 0418  AST 15 02/15/2024 0418   ALT 19  02/15/2024 0418   ALKPHOS 41 02/15/2024 0418   BILITOT 0.6 02/15/2024 0418   GFRNONAA >60 02/15/2024 0418   GFRAA >60 12/29/2017 1129   Lab Results  Component Value Date   CHOL 150 11/27/2015   HDL 53 11/27/2015   LDLCALC 71 11/27/2015   LDLDIRECT 198.1 08/16/2012   TRIG 132 11/27/2015   CHOLHDL 4 05/20/2014   Lab Results  Component Value Date   HGBA1C 5.9 05/20/2014   Lab Results  Component Value Date   VITAMINB12 217 06/21/2023   Lab Results  Component Value Date   TSH 0.510 06/21/2023    02/14/24 CT head  1. No acute intracranial abnormality. 2. Stable chronic white matter disease.   ASSESSMENT AND PLAN  88 y.o. year old female here with hearing and vision impairment, arthritis, history of lumbar spine surgery, progressive physical and cognitive decline.   Dx:  1. Numbness and tingling in left hand   2. Memory loss      PLAN:  MEMORY LOSS (short term memory loss; disorientation; confusion; since 2024; also with vision and hearing impairment; also with physical decline) - concern for onset on mild dementia superimposed on vision and hearing impairment - check ATN panel, B12 level - try to stay active physically and get some exercise (at least 15-30 minutes per day) - eat a nutritious diet with lean protein, plants / vegetables, whole grains; avoid ultra-processed foods - increase social activities, brain stimulation, games, puzzles, hobbies, crafts, arts, music; try new activities; keep it fun! - aim for at least 7-8 hours sleep per night (or more) - avoid smoking and alcohol  - needs help with medications, finances; not driving - safety / supervision issues reviewed; caution with living alone; likely will need 24 / 7 supervision - caregiver resources provided (including [email or web address])  LEFT HAND NUMBNESS (no pain, no weakness; since ~2024) - possible peripheral neuropathy vs cervical radiculopathy - patient not interested in surgical treatments; will hold off  on MRI cervical spine and EMG/NCS - avoid compression at elbow / wrist - consider occupational therapy  Orders Placed This Encounter  Procedures   ATN PROFILE   Vitamin B12   Return for return to PCP, pending if symptoms worsen or fail to improve, pending test results.  I spent 60 minutes of face-to-face and non-face-to-face time with patient.  This included previsit chart review, lab review, study review, order entry, electronic health record documentation, patient education.     EDUARD FABIENE HANLON, MD 04/17/2024, 11:59 AM Certified in Neurology, Neurophysiology and Neuroimaging  [facility] [address], [address] [place], [place] [address] [phone]

## 2024-04-17 NOTE — Patient Instructions (Signed)
  MEMORY LOSS (short term memory loss; disorientation; confusion; since 2024; also with vision and hearing impairment; also with physical decline) - concern for onset on mild dementia superimposed on vision and hearing impairment - check ATN panel, B12 level - try to stay active physically and get some exercise (at least 15-30 minutes per day) - eat a nutritious diet with lean protein, plants / vegetables, whole grains; avoid ultra-processed foods - increase social activities, brain stimulation, games, puzzles, hobbies, crafts, arts, music; try new activities; keep it fun! - aim for at least 7-8 hours sleep per night (or more) - avoid smoking and alcohol  - needs help with medications, finances; not driving - safety / supervision issues reviewed; needs 24 / 7 supervision - caregiver resources provided (including [email or web address])  LEFT HAND NUMBNESS (no pain, no weakness; since ~2024) - possible peripheral neuropathy vs cervical radiculopathy - patient not interested in surgical treatments; will hold off on MRI cervical spine and EMG/NCS - avoid compression at elbow / wrist - consider occupational therapy

## 2024-04-18 DIAGNOSIS — H53413 Scotoma involving central area, bilateral: Secondary | ICD-10-CM | POA: Diagnosis not present

## 2024-04-20 LAB — ATN PROFILE
A -- Beta-amyloid 42/40 Ratio: 0.123 (ref >0.102)
Beta-amyloid 40: 196.77 pg/mL
Beta-amyloid 42: 24.13 pg/mL
N -- NfL, Plasma: 11.9 pg/mL — ABNORMAL HIGH (ref 0.00–9.13)
T -- p-tau181: 0.6 pg/mL (ref 0.00–0.97)

## 2024-04-20 LAB — VITAMIN B12: Vitamin B-12: 457 pg/mL (ref 232–1245)

## 2024-05-04 ENCOUNTER — Other Ambulatory Visit (HOSPITAL_COMMUNITY): Payer: Self-pay

## 2024-05-09 ENCOUNTER — Telehealth: Payer: Self-pay | Admitting: Diagnostic Neuroimaging

## 2024-05-09 NOTE — Telephone Encounter (Signed)
 Niece reports within the past 4 months pt's memory has worsen, pt is blind, home alone, repeats herself, makes call to niece 54 times a day.  Niece would like to pursue legal guardianship and eventually have pt placed in assisted living.

## 2024-05-13 ENCOUNTER — Ambulatory Visit: Payer: Self-pay | Admitting: Diagnostic Neuroimaging

## 2024-05-24 ENCOUNTER — Other Ambulatory Visit: Payer: Self-pay

## 2024-05-24 ENCOUNTER — Emergency Department (HOSPITAL_COMMUNITY)
Admission: EM | Admit: 2024-05-24 | Discharge: 2024-05-26 | Disposition: A | Discharge status: Skilled Nursing Facility | Attending: Emergency Medicine | Admitting: Emergency Medicine

## 2024-05-24 ENCOUNTER — Encounter (HOSPITAL_COMMUNITY): Payer: Self-pay

## 2024-05-24 ENCOUNTER — Emergency Department (HOSPITAL_COMMUNITY)

## 2024-05-24 DIAGNOSIS — Z9181 History of falling: Secondary | ICD-10-CM | POA: Diagnosis not present

## 2024-05-24 DIAGNOSIS — H548 Legal blindness, as defined in USA: Secondary | ICD-10-CM | POA: Insufficient documentation

## 2024-05-24 DIAGNOSIS — M25551 Pain in right hip: Secondary | ICD-10-CM | POA: Diagnosis present

## 2024-05-24 DIAGNOSIS — W19XXXA Unspecified fall, initial encounter: Secondary | ICD-10-CM | POA: Insufficient documentation

## 2024-05-24 LAB — URINALYSIS, W/ REFLEX TO CULTURE (INFECTION SUSPECTED)
Bacteria, UA: NONE SEEN
Bilirubin Urine: NEGATIVE
Glucose, UA: NEGATIVE mg/dL
Hgb urine dipstick: NEGATIVE
Ketones, ur: NEGATIVE mg/dL
Leukocytes,Ua: NEGATIVE
Nitrite: NEGATIVE
Protein, ur: 30 mg/dL — AB
Specific Gravity, Urine: 1.013 (ref 1.005–1.030)
pH: 7 (ref 5.0–8.0)

## 2024-05-24 LAB — CBC WITH DIFFERENTIAL/PLATELET
Abs Immature Granulocytes: 0.03 K/uL (ref 0.00–0.07)
Basophils Absolute: 0 K/uL (ref 0.0–0.1)
Basophils Relative: 0 %
Eosinophils Absolute: 0 K/uL (ref 0.0–0.5)
Eosinophils Relative: 0 %
HCT: 38.1 % (ref 36.0–46.0)
Hemoglobin: 12.4 g/dL (ref 12.0–15.0)
Immature Granulocytes: 0 %
Lymphocytes Relative: 9 %
Lymphs Abs: 0.7 K/uL (ref 0.7–4.0)
MCH: 31.1 pg (ref 26.0–34.0)
MCHC: 32.5 g/dL (ref 30.0–36.0)
MCV: 95.5 fL (ref 80.0–100.0)
Monocytes Absolute: 0.5 K/uL (ref 0.1–1.0)
Monocytes Relative: 6 %
Neutro Abs: 6.9 K/uL (ref 1.7–7.7)
Neutrophils Relative %: 85 %
Platelets: 188 K/uL (ref 150–400)
RBC: 3.99 MIL/uL (ref 3.87–5.11)
RDW: 12.8 % (ref 11.5–15.5)
WBC: 8.2 K/uL (ref 4.0–10.5)
nRBC: 0 % (ref 0.0–0.2)

## 2024-05-24 LAB — COMPREHENSIVE METABOLIC PANEL WITH GFR
ALT: 20 U/L (ref 0–44)
AST: 25 U/L (ref 15–41)
Albumin: 4.3 g/dL (ref 3.5–5.0)
Alkaline Phosphatase: 60 U/L (ref 38–126)
Anion gap: 11 (ref 5–15)
BUN: 17 mg/dL (ref 8–23)
CO2: 28 mmol/L (ref 22–32)
Calcium: 9.6 mg/dL (ref 8.9–10.3)
Chloride: 102 mmol/L (ref 98–111)
Creatinine, Ser: 0.53 mg/dL (ref 0.44–1.00)
GFR, Estimated: >60 mL/min
Glucose, Bld: 103 mg/dL — ABNORMAL HIGH (ref 70–99)
Potassium: 3.3 mmol/L — ABNORMAL LOW (ref 3.5–5.1)
Sodium: 140 mmol/L (ref 135–145)
Total Bilirubin: 0.4 mg/dL (ref 0.0–1.2)
Total Protein: 7 g/dL (ref 6.5–8.1)

## 2024-05-24 LAB — I-STAT CHEM 8, ED
BUN: 19 mg/dL (ref 8–23)
Calcium, Ion: 1.17 mmol/L (ref 1.15–1.40)
Chloride: 101 mmol/L (ref 98–111)
Creatinine, Ser: 0.6 mg/dL (ref 0.44–1.00)
Glucose, Bld: 105 mg/dL — ABNORMAL HIGH (ref 70–99)
HCT: 37 % (ref 36.0–46.0)
Hemoglobin: 12.6 g/dL (ref 12.0–15.0)
Potassium: 3.1 mmol/L — ABNORMAL LOW (ref 3.5–5.1)
Sodium: 141 mmol/L (ref 135–145)
TCO2: 28 mmol/L (ref 22–32)

## 2024-05-24 LAB — RESP PANEL BY RT-PCR (RSV, FLU A&B, COVID)  RVPGX2
Influenza A by PCR: NEGATIVE
Influenza B by PCR: NEGATIVE
Resp Syncytial Virus by PCR: NEGATIVE
SARS Coronavirus 2 by RT PCR: NEGATIVE

## 2024-05-24 LAB — CK: Total CK: 241 U/L — ABNORMAL HIGH (ref 38–234)

## 2024-05-24 MED ORDER — OXYCODONE-ACETAMINOPHEN 5-325 MG PO TABS
1.0000 | ORAL_TABLET | Freq: Once | ORAL | Status: AC
  Administered 2024-05-24: 1 via ORAL
  Filled 2024-05-24: qty 1

## 2024-05-24 MED ORDER — ACETAMINOPHEN 325 MG PO TABS: 650.0000 mg | ORAL_TABLET | Freq: Four times a day (QID) | ORAL | Status: DC | PRN

## 2024-05-24 MED ORDER — SODIUM CHLORIDE 0.9 % IV BOLUS
1000.0000 mL | Freq: Once | INTRAVENOUS | Status: AC
  Administered 2024-05-24: 1000 mL via INTRAVENOUS

## 2024-05-24 MED ORDER — SODIUM CHLORIDE 0.9 % IV SOLN: INTRAVENOUS | Status: DC

## 2024-05-24 MED ORDER — OXYCODONE HCL 5 MG PO TABS: 5.0000 mg | ORAL_TABLET | Freq: Four times a day (QID) | ORAL | Status: DC | PRN

## 2024-05-24 MED ORDER — MELATONIN 3 MG PO TABS
3.0000 mg | ORAL_TABLET | Freq: Every day | ORAL | Status: DC
  Administered 2024-05-24 – 2024-05-25 (×2): 3 mg via ORAL
  Filled 2024-05-24 (×2): qty 1

## 2024-05-24 NOTE — ED Notes (Signed)
 Pt back from x-ray.

## 2024-05-24 NOTE — Progress Notes (Signed)
 Judith is a 89 yo f who presents to the ED s/p fall, was on the ground for several hours before her shouting alerted a neighbor, Animator [phone]). Family (Niece/mPOA Laneta Patience, [phone]) reports history of symptoms consistent with a memory/neurocognitive disorder and expresses significant concern in respect to patient's decision-making capacity. Reviewed and reinforced patient's right to autonomy unless otherwise noted by a physician.   CSW alerted by EPD to concerns brought about by the patient's support system. They share that the patient is mostly blind, extremely hard of hearing, and reliant on a WW for ambulation. They share that patient has had several falls in the last few weeks that have resulted in her spending significant amounts of time down, on the ground, in her home, alone. Patient appears to have no recolection of these falls and denies them adamantly. Family also share several instances of her getting lost in her home or outside the home in the cold and needing redirected. Patient again denies. She does have caregivers that come in most days from 10-1 and receives significant support from neighbors, though these supports do appear strained and utilized to the greatest extent possible. Because of the frequent falls and aforementioned risk factors that affect patient's safe disposition home, family advocating for some form of placement. They state that placement in an ALF or LTC at SNF is the ultimate goal but understand there are numerous barriers to this, primarily the patient's own willingness to leave home. Patient has been described by supports as both fiercely independent and prideful. They are looking into placement at [facility], will also provide information for placement coordinator from [facility].   CSW met with patient at bedside several times. She was accompanied by [place] who did step out of the room at patient's request. After a long discussion,  patient hesitant to commit to any form of LTC and verbalizes several times her desire to return home where she is less limited by vision and can be with her cat, Lucky. CSW shared family's concerns with her. She did admit to feelings of weakness and gait instability. Ultimately, she was agreeable to compromise and pursue STR at SNF. She has previously been to [facility] and family is adamant she not return.   CSW provided education of SNF process to patient and family, requested PT/OT and official [facility] consult from EDP. Patient moved to boarder status. Will need [employer] auth. TOC following.

## 2024-05-24 NOTE — ED Provider Notes (Addendum)
 " [facility] Provider Note   CSN: [id number] Arrival date & time: 05/24/24  1209     Patient presents with: Fall   Kathryn Romero is a 89 y.o. female.   89 year old female with history of hard of hearing as well as legally blind presents with mechanical fall today.  She does not take any blood thinners.  Patient says she was worried about them lost her footing and fell onto her right hip.  She did not strike her head.  Does not have any neck pain.  Was unable to get up after the incident.  Was on the bathroom floor for approximately 5 hours.  Yelled out to her neighbors who called EMS.  EMS arrived, CBG was 108.       Prior to Admission medications  Medication Sig Start Date End Date Taking? Authorizing Provider  ADVIL 200 MG CAPS Take 400 mg by mouth at bedtime.    [provider]  Alpha-Lipoic Acid 600 MG CAPS Take 600 mg by mouth at bedtime.    [provider]  Cholecalciferol  (VITAMIN D3) 50 MCG (2000 UT) TABS Take 2,000 Units by mouth daily with breakfast.    [provider]  Magnesium  Bisglycinate (MAG GLYCINATE PO) Take 1 capsule by mouth at bedtime.    [provider]  nitrofurantoin  (MACRODANTIN ) 50 MG capsule Take 50 mg by mouth daily.    [provider]  NON FORMULARY Apply 1 patch topically See admin instructions. Alena Cassis Brand Pain Relieving Medicated Plaster patches - Apply 1 patch to affected shoulder(s) once a day as needed for pain/remove as directed    [provider]  psyllium (HYDROCIL/METAMUCIL) 95 % PACK Take 1 packet by mouth daily.    [provider]  VITAMIN E BLEND PO Take 1 capsule by mouth in the morning.    [provider]    Allergies: Codeine, Fish allergy, Hydromet [hydrocodone  bit-homatrop mbr], Shellfish allergy, Shrimp extract, Cephalexin , Morphine, and Sulfa antibiotics    Review of Systems  All other systems reviewed and are  negative.   Updated Vital Signs BP (!) 158/74 (BP Location: Left Arm)   Pulse 91   Temp 97.9 F (36.6 C) (Oral)   Resp 18   Ht 1.549 m (5' 1)   SpO2 92%   BMI 31.55 kg/m   Physical Exam Vitals and nursing note reviewed.  Constitutional:      General: She is not in acute distress.    Appearance: Normal appearance. She is well-developed. She is not toxic-appearing.  HENT:     Head: Normocephalic and atraumatic.  Eyes:     General: Lids are normal.     Conjunctiva/sclera: Conjunctivae normal.     Pupils: Pupils are equal, round, and reactive to light.  Neck:     Thyroid : No thyroid  mass.     Trachea: No tracheal deviation.  Cardiovascular:     Rate and Rhythm: Normal rate and regular rhythm.     Heart sounds: Normal heart sounds. No murmur heard.    No gallop.  Pulmonary:     Effort: Pulmonary effort is normal. No respiratory distress.     Breath sounds: Normal breath sounds. No stridor. No decreased breath sounds, wheezing, rhonchi or rales.  Abdominal:     General: There is no distension.     Palpations: Abdomen is soft.     Tenderness: There is no abdominal tenderness. There is no rebound.  Musculoskeletal:  General: No tenderness. Normal range of motion.     Cervical back: Normal range of motion and neck supple.     Comments: Right lower extremity shortened and rotated internally.  Patient is neurovasc intact at right foot Nontender along cervical thoracic and lumbar spine  Skin:    General: Skin is warm and dry.     Findings: No abrasion or rash.  Neurological:     General: No focal deficit present.     Mental Status: She is alert and oriented to person, place, and time. Mental status is at baseline.     GCS: GCS eye subscore is 4. GCS verbal subscore is 5. GCS motor subscore is 6.     Cranial Nerves: No cranial nerve deficit.     Sensory: No sensory deficit.     Motor: Motor function is intact.  Psychiatric:        Attention and Perception: Attention  normal.        Speech: Speech normal.        Behavior: Behavior normal.     (all labs ordered are listed, but only abnormal results are displayed) Labs Reviewed  CBC WITH DIFFERENTIAL/PLATELET  COMPREHENSIVE METABOLIC PANEL WITH GFR  CK  URINALYSIS, W/ REFLEX TO CULTURE (INFECTION SUSPECTED)  I-STAT CHEM 8, ED    EKG: EKG Interpretation Date/Time:  Thursday May 24 2024 12:22:32 EST Ventricular Rate:  87 PR Interval:  194 QRS Duration:  96 QT Interval:  374 QTC Calculation: 450 R Axis:   -14  Text Interpretation: Sinus rhythm S1,S2,S3 pattern Low voltage, precordial leads Abnormal R-wave progression, late transition No significant change since last tracing Confirmed by Dasie Faden ([id number]) on 05/24/2024 12:27:30 PM  Radiology: No results found.   Procedures   Medications Ordered in the ED  0.9 %  sodium chloride  infusion (has no administration in time range)  sodium chloride  0.9 % bolus 1,000 mL (has no administration in time range)                                    Medical Decision Making Amount and/or Complexity of Data Reviewed Labs: ordered. Radiology: ordered. ECG/medicine tests: ordered.  Risk Prescription drug management.   Patient given IV fluids and feels better.  X-ray of her right hip and pelvis negative for fracture.  Laboratory studies here including urine and COVID test all negative.  Labs here are reassuring.  CK negative.  No evidence of rhabdomyolysis.  Patient uses a walker at baseline to ambulate and was given when here and was able to ambulate at her baseline.  Will discharge back to home   3:31 PM Patient was to be discharged but then her neighbor states that patient has had increasing falls for quite some time.  They have home health currently but only for limited hours in the daytime.  According to the neighbor, patient has a relative, Kathryn Romero who lives in [place] who has been trying to get guardianship because patient has been resistant  go to facility.  Current episode has patient on the floor after she fell and could not get a hold of people.  Please at the breakdown the patient's door.  While they were doing well check.  Will consult TOC for home health needs with possible placement.  Patient is agreeable to increase care at this time  Final diagnoses:  None    ED Discharge Orders  None          Dasie Faden, MD 05/24/24 1436    Dasie Faden, MD 05/24/24 1533    Dasie Faden, MD 05/24/24 1538  "

## 2024-05-24 NOTE — ED Notes (Signed)
 Pt transported to xray

## 2024-05-24 NOTE — ED Triage Notes (Signed)
 Pt bib [facility] from home. Pt does not remember fall but says she has probably been in the bathroom since 7am. Shortening and rotating to right leg with a good pedal pulse. No blood thinner. Pt is extremely hard of hearing and going blind. Neuropathy in the left arm for 2 years. Axo 4.   Cbg 108

## 2024-05-25 NOTE — ED Provider Notes (Signed)
 Emergency Medicine Observation Re-evaluation Note  Kathryn Romero is a 89 y.o. female, seen on rounds today.  Pt initially presented to the ED for complaints of fall in the context of blindness, poor hearing, substantial dependence on assistive for living at home   Currently, the patient is resting, no distress.  Hearing is not present, making interaction challenging, but vitals unremarkable, no new complaints per nursing.  Physical Exam  BP (!) 156/72   Pulse 81   Temp 97.6 F (36.4 C) (Rectal)   Resp 20   Ht 1.549 m (5' 1)   SpO2 94%   BMI 31.55 kg/m  Physical Exam General: Elderly female resting Cardiac: Regular rate and rhythm Lungs: No increased work of breathing Psych: Calm  ED Course / MDM  EKG:EKG Interpretation Date/Time:  Thursday May 24 2024 12:22:32 EST Ventricular Rate:  87 PR Interval:  194 QRS Duration:  96 QT Interval:  374 QTC Calculation: 450 R Axis:   -14  Text Interpretation: Sinus rhythm S1,S2,S3 pattern Low voltage, precordial leads Abnormal R-wave progression, late transition No significant change since last tracing Confirmed by Dasie Faden ([id number]) on 05/24/2024 12:27:30 PM  I have reviewed the labs performed to date as well as medications administered while in observation.  Recent changes in the last 24 hours include no notable changes, patient has been seen and evaluated by our social work team, PT consult pending, social work assistance pending.  Plan  Current plan is for completion of evaluation from social work, PT.    Garrick Charleston, MD 05/25/24 [phone]

## 2024-05-25 NOTE — Progress Notes (Addendum)
 SNF referrals launched. Accepting facilities include [facility], [facility], [facility], and [facility]. Reviewed facilities over phone with niece Laneta. She lists [facility] as preference, choice supported by [employer]. Upon follow up with Brittany in admissions at [facility], they have no bed to offer until Monday. CSW then followed up with [facility], [facility], and [facility], all unable to offer beds over the weekend. [facility], [facility], [facility], [facility], and [facility] are considering. CSW followed up  admissions contacts in these facilties and a bed has not yet been offered. Logan in admissions at [facility] is reviewing and will text SW ED phone later tonight if able to offer a bed for tomorrow, but has not offered one as of yet. CSW also followed up with several facilities whose bed availability is pending but has not received an affirmative response from any facility thus far.   Attempted to call [name] to discuss. She is at a work function tonight and her phone is off, detailed VM left. Reviewed the need to move forward with first accepting facility or target discharge home.   Will sign out to weekend coverage. TOC following.   7:54: Met with patient at bedside several times tonight as well. Provided updates, advocacy, offered support. Patient remains agreeable to pursue STR at SNF.   9:55: Leontine with [facility] offered bed. [employer] auth requested via [place], approved. Auth# [id number]. SOC 1/17. NRD 1/20. Leontine is contact for admission tomorrow in AM, [phone]. Will need BLS transport. Updated Niece Laneta. She is requesting call and hoping for [facility] to have a bed to offer tomorrow.   Anticipate discharge tomorrow to SNF. TOC following.

## 2024-05-25 NOTE — Progress Notes (Signed)
 SNF ref faxed; awaiting bed offers.

## 2024-05-25 NOTE — Progress Notes (Signed)
 Awaiting PT eval.

## 2024-05-25 NOTE — [place] FL2 (Signed)
 " [place]  MEDICAID FL2 LEVEL OF CARE FORM     IDENTIFICATION  Patient Name: Kathryn Romero Birthdate: 10-18-1935 Sex: female Admission Date (Current Location): 05/24/2024  [facility] and [place] Number:  Producer, Television/film/video and Address:  [facility] [place], [address], [place], [place] [address]      Provider Number: [id number]  Attending Physician Name and Address:  Dasie Faden, MD  Relative Name and Phone Number:  Oral Edelman  Niece, [phone]    Current Level of Care: Hospital Recommended Level of Care: Skilled Nursing Facility Prior Approval Number:    Date Approved/Denied:   PASRR Number: [id number]  Discharge Plan: SNF    Current Diagnoses: Patient Active Problem List   Diagnosis Date Noted   Constipation 02/14/2024   Peripheral neuropathy 02/14/2024   Acute metabolic encephalopathy 12/07/2023   Failure to thrive (child) 06/27/2023   Adult wellness visit 06/20/2023   Gastroenteritis 06/20/2023   Diarrhea 02/22/2022   Abdominal pain 02/22/2022   Nausea & vomiting 02/20/2022   Hypokalemia 02/20/2022   Legal blindness 02/20/2022   Degenerative lumbar spinal stenosis 01/06/2018   Retinitis pigmentosa 01/27/2017   Benign paroxysmal positional vertigo 01/15/2016   Spondylolisthesis at L4-L5 level 11/14/2015   Gastro-esophageal reflux disease without esophagitis 11/20/2014   Hyperlipidemia 10/29/2014   DOE (dyspnea on exertion) 10/14/2014   Generalized weakness 09/19/2014   UTI (urinary tract infection) 09/19/2014   Obese 05/20/2014   Nausea with vomiting 04/16/2009   RETINITIS PIGMENTOSA 12/04/2008   GLUCOSE INTOLERANCE 05/09/2007   Dyslipidemia 05/09/2007   ANEMIA-NOS 05/09/2007   ANXIETY 05/09/2007   Essential hypertension 05/09/2007   DIVERTICULOSIS, COLON 05/09/2007   BACK PAIN 05/09/2007   History of malignant neoplasm of large intestine 05/09/2007   ARTHRITIS, TRAUMATIC, UNSPECIFIED SITE 12/17/2006    Orientation  RESPIRATION BLADDER Height & Weight     Self, Time, Situation, Place  Normal Continent Weight:   Height:  5' 1 (154.9 cm)  BEHAVIORAL SYMPTOMS/MOOD NEUROLOGICAL BOWEL NUTRITION STATUS      Continent Diet (see dc summary)  AMBULATORY STATUS COMMUNICATION OF NEEDS Skin   Extensive Assist Verbally Normal                       Personal Care Assistance Level of Assistance  Feeding, Bathing, Dressing Bathing Assistance: Limited assistance Feeding assistance: Limited assistance Dressing Assistance: Limited assistance     Functional Limitations Info  Hearing, Sight, Speech Sight Info: Impaired Hearing Info: Impaired Speech Info: Adequate    SPECIAL CARE FACTORS FREQUENCY  PT (By licensed PT), OT (By licensed OT)     PT Frequency: 5x/wk OT Frequency: 5x/wk            Contractures Contractures Info: Not present    Additional Factors Info  Code Status, Allergies Code Status Info: Full Code Allergies Info: Codeine  Fish Allergy  Hydromet (Hydrocodone  Bit-homatrop Mbr)  Shellfish Allergy  Shrimp Extract  Cephalexin   Morphine  Sulfa Antibiotics           Current Medications (05/25/2024):  This is the current hospital active medication list Current Facility-Administered Medications  Medication Dose Route Frequency Provider Last Rate Last Admin   0.9 %  sodium chloride  infusion   Intravenous Continuous Dasie Faden, MD 125 mL/hr at 05/24/24 2306 New Bag at 05/24/24 2306   acetaminophen  (TYLENOL ) tablet 650 mg  650 mg Oral Q6H PRN Paterson, Robert C, MD       melatonin tablet 3 mg  3  mg Oral QHS Yolande Lamar BROCKS, MD   3 mg at 05/24/24 2233   oxyCODONE  (Oxy IR/ROXICODONE ) immediate release tablet 5 mg  5 mg Oral Q6H PRN Yolande Lamar BROCKS, MD       Current Outpatient Medications  Medication Sig Dispense Refill   ADVIL 200 MG CAPS Take 400 mg by mouth at bedtime.     Alpha-Lipoic Acid 600 MG CAPS Take 600 mg by mouth at bedtime.     Cholecalciferol  (VITAMIN D3) 50 MCG  (2000 UT) TABS Take 2,000 Units by mouth daily with breakfast.     Magnesium  Bisglycinate (MAG GLYCINATE PO) Take 1 capsule by mouth at bedtime.     nitrofurantoin  (MACRODANTIN ) 50 MG capsule Take 50 mg by mouth daily.     NON FORMULARY Apply 1 patch topically See admin instructions. Alena Cassis Brand Pain Relieving Medicated Plaster patches - Apply 1 patch to affected shoulder(s) once a day as needed for pain/remove as directed     psyllium (HYDROCIL/METAMUCIL) 95 % PACK Take 1 packet by mouth daily.     VITAMIN E BLEND PO Take 1 capsule by mouth in the morning.       Discharge Medications: Please see discharge summary for a list of discharge medications.  Relevant Imaging Results:  Relevant Lab Results:   Additional Information SSN [id number]  Sheri ONEIDA [place], LCSW     "

## 2024-05-25 NOTE — Evaluation (Signed)
 Physical Therapy Evaluation Patient Details Name: Lakeidra Reliford MRN: [id number] DOB: 03-Mar-1936 Today's Date: 05/25/2024  History of Present Illness  Dilpreet is a 89 yo f who presents to the ED s/p fall, was on the ground for several hours before her shouting alerted a neighbor. Niece reports history of symptoms consistent with a memory/neurocognitive disorder and expresses significant concern in respect to patient's decision-making capacity. Pt with multiple falls per chart. PMH: unavailable  Clinical Impression  Pt admitted with above diagnosis. Pt was able to sit edge of stretcher with min assist for up to 5 min. Could not stand to feet with bil UE and max assist due to fear and weakness.  Recommend post acute rehab < 3 hours day prior to d/c home as pt lives alone and is currently needing +2 assist for OOB. Chart also reports hx of falls.  Will follow acutely  Pt currently with functional limitations due to the deficits listed below (see PT Problem List). Pt will benefit from acute skilled PT to increase their independence and safety with mobility to allow discharge.           If plan is discharge home, recommend the following: Two people to help with walking and/or transfers;Two people to help with bathing/dressing/bathroom;Assistance with feeding;Help with stairs or ramp for entrance;Assist for transportation;Supervision due to cognitive status   Can travel by private vehicle   No    Equipment Recommendations Other (comment) (TBA)  Recommendations for Other Services       Functional Status Assessment Patient has had a recent decline in their functional status and demonstrates the ability to make significant improvements in function in a reasonable and predictable amount of time.     Precautions / Restrictions Precautions Precautions: Fall Restrictions Weight Bearing Restrictions Per Provider Order: No      Mobility  Bed Mobility Overal bed mobility: Needs Assistance Bed  Mobility: Supine to Sit, Sit to Supine     Supine to sit: Mod assist Sit to supine: Max assist   General bed mobility comments: Needed mod assist to come to edge of stretcher and max assist to lie back down.    Transfers Overall transfer level: Needs assistance Equipment used: 2 person hand held assist Transfers: Sit to/from Stand Sit to Stand: Max assist, From elevated surface           General transfer comment: Unable to stand to feet questinably due to pts fear. She attempted to stand with bil UE support but could not clear buttocks fully off stretcher for fear of falling. Pt stated,  I must lay back down.    Ambulation/Gait               General Gait Details: unable  Stairs            Wheelchair Mobility     Tilt Bed    Modified Rankin (Stroke Patients Only)       Balance                                             Pertinent Vitals/Pain Pain Assessment Pain Assessment: No/denies pain    Home Living Family/patient expects to be discharged to:: Private residence Living Arrangements: Alone Available Help at Discharge: Family;Available PRN/intermittently;Friend(s);Personal care attendant (10-1 M-F) Type of Home: House Home Access: Stairs to enter Entrance Stairs-Rails: Right;Left Entrance Stairs-Number of Steps: 2  Home Layout: One level Home Equipment: Rollator (4 wheels);Transport chair;Tub bench;BSC/3in1 Additional Comments: pt [facility]    Prior Function Prior Level of Function : Needs assist;Patient poor historian/Family not available;History of Falls (last six months)             Mobility Comments: pt reports using rollator for household distances, transport chair for stair negotiation ADLs Comments: pt reports aide assists with tub bath 1x/week, reports I just stay in my pajamas all the time, Ind with toileting, aide cooks meals or friends provides meals      Extremity/Trunk Assessment   Upper Extremity Assessment Upper Extremity Assessment: Defer to OT evaluation    Lower Extremity Assessment Lower Extremity Assessment: Generalized weakness    Cervical / Trunk Assessment Cervical / Trunk Assessment: Kyphotic  Communication   Communication Communication: Impaired Factors Affecting Communication: Hearing impaired    Cognition Arousal: Alert Behavior During Therapy: WFL for tasks assessed/performed   PT - Cognitive impairments: Orientation, Memory, Attention, Sequencing, Problem solving, Safety/Judgement   Orientation impairments: Place, Time, Situation                   PT - Cognition Comments: At times, pt unaware she is in hospital,  denies hx of falls Following commands: Impaired Following commands impaired: Follows one step commands inconsistently, Follows one step commands with increased time     Cueing Cueing Techniques: Verbal cues, Tactile cues     General Comments      Exercises General Exercises - Lower Extremity Ankle Circles/Pumps: AROM, Both, 5 reps, Supine Quad Sets: AROM, Both, 5 reps, Supine Long Arc Quad: AROM, Both, Seated, 5 reps   Assessment/Plan    PT Assessment Patient needs continued PT services  PT Problem List Decreased activity tolerance;Decreased balance;Decreased mobility;Decreased knowledge of use of DME;Decreased safety awareness;Decreased knowledge of precautions;Cardiopulmonary status limiting activity       PT Treatment Interventions DME instruction;Gait training;Functional mobility training;Therapeutic activities;Therapeutic exercise;Balance training;Patient/family education    PT Goals (Current goals can be found in the Care Plan section)  Acute Rehab PT Goals Patient Stated Goal: to go home with incr assist PT Goal Formulation: With patient Time For Goal Achievement: 06/08/24 Potential to Achieve Goals: Good    Frequency Min 2X/week     Co-evaluation                AM-PAC PT 6 Clicks Mobility  Outcome Measure Help needed turning from your back to your side while in a flat bed without using bedrails?: A Lot Help needed moving from lying on your back to sitting on the side of a flat bed without using bedrails?: A Lot Help needed moving to and from a bed to a chair (including a wheelchair)?: Total Help needed standing up from a chair using your arms (e.g., wheelchair or bedside chair)?: Total Help needed to walk in hospital room?: Total Help needed climbing 3-5 steps with a railing? : Total 6 Click Score: 8    End of Session Equipment Utilized During Treatment: Gait belt Activity Tolerance: Patient limited by fatigue Patient left: with call bell/phone within reach (on stretcher) Nurse Communication: Mobility status;Need for lift equipment PT Visit Diagnosis: Muscle weakness (generalized) (M62.81);Unsteadiness on feet (R26.81)    Time: 8891-8868 PT Time Calculation (min) (ACUTE ONLY): 23 min   Charges:   PT Evaluation $PT Eval Moderate Complexity: 1 Mod PT Treatments $Therapeutic Activity: 8-22 mins PT General Charges $$ ACUTE PT VISIT: 1 Visit  [facility] M,PT Acute Rehab Services [phone]   Stephane JULIANNA Bevel 05/25/2024, 2:26 PM

## 2024-05-26 NOTE — Progress Notes (Signed)
 CSW met with patient at bedside to inform that she would be leaving for [facility], [place] from admission sent over report number [id number] Bed assignment 217 A. RN has been informed, Patient niece was informed by this CSW.   [place] Mayerli Kirst LCSW  05/26/2024 10:22 AM

## 2024-05-26 NOTE — ED Notes (Signed)
 [facility] called, 3rd on list.

## 2024-05-26 NOTE — ED Provider Notes (Signed)
 Emergency Medicine Observation Re-evaluation Note  Kathryn Romero is a 89 y.o. female, seen on rounds today.  Pt initially presented to the ED for complaints of Fall Currently, the patient is sleeping.  Physical Exam  BP (!) 115/49   Pulse 81   Temp 97.7 F (36.5 C) (Oral)   Resp 19   Ht 5' 1 (1.549 m)   SpO2 96%   BMI 31.55 kg/m  Physical Exam General: NAD Cardiac: Regular HR Lungs: No resp distress Psych: Stable  ED Course / MDM  EKG:EKG Interpretation Date/Time:  Thursday May 24 2024 12:22:32 EST Ventricular Rate:  87 PR Interval:  194 QRS Duration:  96 QT Interval:  374 QTC Calculation: 450 R Axis:   -14  Text Interpretation: Sinus rhythm S1,S2,S3 pattern Low voltage, precordial leads Abnormal R-wave progression, late transition No significant change since last tracing Confirmed by Dasie Faden ([id number]) on 05/24/2024 12:27:30 PM  I have reviewed the labs performed to date as well as medications administered while in observation.    Plan  Current plan is for [facility] assistance with placement - possible dispo to SNF today    Cottie Donnice PARAS, MD 05/26/24 [phone]

## 2024-05-29 NOTE — Telephone Encounter (Signed)
 Pt's niece has called to report quick urgency regarding a need for a diagnosis for pt.  Without that she is unable to make decisions for pt.  Pt had a fall according to niece on Thursday, is in rehab facility, pt wanting to go home, niece is sending a my chart message to RN and Dr Margaret re: urgent request to try and get pt diagnosis.

## 2024-05-29 NOTE — Telephone Encounter (Signed)
 Patient fell Thursday and is currently in a rehab facility. Patient's daughter would like to move up appointment to have her deemed medically unfit due to safety risk of her living alone with memory issues. Please advise.

## 2024-06-08 NOTE — Progress Notes (Signed)
 No change

## 2024-06-27 ENCOUNTER — Ambulatory Visit: Admitting: Diagnostic Neuroimaging
